# Patient Record
Sex: Female | Born: 1955
Health system: Southern US, Community
[De-identification: ages and names within clinical notes are randomized; demographics above are authoritative.]

## PROBLEM LIST (undated history)

## (undated) DIAGNOSIS — E039 Hypothyroidism, unspecified: Secondary | ICD-10-CM

## (undated) DIAGNOSIS — I493 Ventricular premature depolarization: Secondary | ICD-10-CM

## (undated) DIAGNOSIS — M1990 Unspecified osteoarthritis, unspecified site: Secondary | ICD-10-CM

## (undated) DIAGNOSIS — Z860100 Personal history of colon polyps, unspecified: Secondary | ICD-10-CM

## (undated) DIAGNOSIS — R0602 Shortness of breath: Secondary | ICD-10-CM

## (undated) DIAGNOSIS — M7989 Other specified soft tissue disorders: Secondary | ICD-10-CM

## (undated) DIAGNOSIS — M2550 Pain in unspecified joint: Secondary | ICD-10-CM

## (undated) DIAGNOSIS — M549 Dorsalgia, unspecified: Secondary | ICD-10-CM

## (undated) DIAGNOSIS — K219 Gastro-esophageal reflux disease without esophagitis: Secondary | ICD-10-CM

## (undated) DIAGNOSIS — E559 Vitamin D deficiency, unspecified: Secondary | ICD-10-CM

## (undated) DIAGNOSIS — R0600 Dyspnea, unspecified: Secondary | ICD-10-CM

## (undated) DIAGNOSIS — J45909 Unspecified asthma, uncomplicated: Secondary | ICD-10-CM

## (undated) DIAGNOSIS — D259 Leiomyoma of uterus, unspecified: Secondary | ICD-10-CM

## (undated) DIAGNOSIS — R112 Nausea with vomiting, unspecified: Secondary | ICD-10-CM

## (undated) DIAGNOSIS — E079 Disorder of thyroid, unspecified: Secondary | ICD-10-CM

## (undated) DIAGNOSIS — I491 Atrial premature depolarization: Secondary | ICD-10-CM

## (undated) DIAGNOSIS — R7303 Prediabetes: Secondary | ICD-10-CM

## (undated) DIAGNOSIS — R12 Heartburn: Secondary | ICD-10-CM

## (undated) DIAGNOSIS — F419 Anxiety disorder, unspecified: Secondary | ICD-10-CM

## (undated) DIAGNOSIS — N840 Polyp of corpus uteri: Secondary | ICD-10-CM

## (undated) DIAGNOSIS — K5900 Constipation, unspecified: Secondary | ICD-10-CM

## (undated) DIAGNOSIS — Z9889 Other specified postprocedural states: Secondary | ICD-10-CM

## (undated) DIAGNOSIS — R002 Palpitations: Secondary | ICD-10-CM

## (undated) DIAGNOSIS — M7580 Other shoulder lesions, unspecified shoulder: Secondary | ICD-10-CM

## (undated) DIAGNOSIS — G43909 Migraine, unspecified, not intractable, without status migrainosus: Secondary | ICD-10-CM

## (undated) HISTORY — DX: Hypothyroidism, unspecified: E03.9

## (undated) HISTORY — DX: Anxiety disorder, unspecified: F41.9

## (undated) HISTORY — PX: TONSILLECTOMY: SUR1361

## (undated) HISTORY — PX: FOOT SURGERY: SHX648

## (undated) HISTORY — DX: Disorder of thyroid, unspecified: E07.9

## (undated) HISTORY — DX: Constipation, unspecified: K59.00

## (undated) HISTORY — DX: Other specified soft tissue disorders: M79.89

## (undated) HISTORY — DX: Vitamin D deficiency, unspecified: E55.9

## (undated) HISTORY — DX: Unspecified osteoarthritis, unspecified site: M19.90

## (undated) HISTORY — DX: Leiomyoma of uterus, unspecified: D25.9

## (undated) HISTORY — DX: Gastro-esophageal reflux disease without esophagitis: K21.9

## (undated) HISTORY — DX: Unspecified asthma, uncomplicated: J45.909

## (undated) HISTORY — DX: Other shoulder lesions, unspecified shoulder: M75.80

## (undated) HISTORY — DX: Polyp of corpus uteri: N84.0

## (undated) HISTORY — DX: Dorsalgia, unspecified: M54.9

## (undated) HISTORY — DX: Pain in unspecified joint: M25.50

## (undated) HISTORY — DX: Migraine, unspecified, not intractable, without status migrainosus: G43.909

## (undated) HISTORY — DX: Atrial premature depolarization: I49.1

## (undated) HISTORY — DX: Heartburn: R12

## (undated) HISTORY — DX: Ventricular premature depolarization: I49.3

## (undated) HISTORY — DX: Shortness of breath: R06.02

## (undated) HISTORY — PX: OTHER SURGICAL HISTORY: SHX169

## (undated) HISTORY — PX: CARDIOVASCULAR STRESS TEST: SHX262

## (undated) HISTORY — PX: APPENDECTOMY: SHX54

---

## 1983-09-08 HISTORY — PX: RESECTION TUMOR CLAVICLE RADICAL: SUR1255

## 1998-09-09 ENCOUNTER — Other Ambulatory Visit: Admission: RE | Admit: 1998-09-09 | Discharge: 1998-09-09 | Payer: Self-pay | Admitting: Obstetrics and Gynecology

## 1999-10-21 ENCOUNTER — Encounter: Admission: RE | Admit: 1999-10-21 | Discharge: 1999-10-21 | Payer: Self-pay | Admitting: Obstetrics and Gynecology

## 1999-10-21 ENCOUNTER — Encounter: Payer: Self-pay | Admitting: Obstetrics and Gynecology

## 1999-10-28 ENCOUNTER — Other Ambulatory Visit: Admission: RE | Admit: 1999-10-28 | Discharge: 1999-10-28 | Payer: Self-pay | Admitting: Obstetrics and Gynecology

## 2000-03-08 ENCOUNTER — Encounter: Admission: RE | Admit: 2000-03-08 | Discharge: 2000-03-08 | Payer: Self-pay | Admitting: Family Medicine

## 2000-03-08 ENCOUNTER — Encounter: Payer: Self-pay | Admitting: Family Medicine

## 2000-06-03 ENCOUNTER — Encounter: Admission: RE | Admit: 2000-06-03 | Discharge: 2000-06-03 | Payer: Self-pay | Admitting: Family Medicine

## 2000-06-03 ENCOUNTER — Encounter: Payer: Self-pay | Admitting: Family Medicine

## 2000-10-21 ENCOUNTER — Encounter: Admission: RE | Admit: 2000-10-21 | Discharge: 2000-10-21 | Payer: Self-pay | Admitting: Family Medicine

## 2000-10-21 ENCOUNTER — Encounter: Payer: Self-pay | Admitting: Family Medicine

## 2000-11-08 ENCOUNTER — Other Ambulatory Visit: Admission: RE | Admit: 2000-11-08 | Discharge: 2000-11-08 | Payer: Self-pay | Admitting: Obstetrics and Gynecology

## 2001-10-28 ENCOUNTER — Encounter: Payer: Self-pay | Admitting: Family Medicine

## 2001-10-28 ENCOUNTER — Encounter: Admission: RE | Admit: 2001-10-28 | Discharge: 2001-10-28 | Payer: Self-pay | Admitting: Family Medicine

## 2001-11-14 ENCOUNTER — Other Ambulatory Visit: Admission: RE | Admit: 2001-11-14 | Discharge: 2001-11-14 | Payer: Self-pay | Admitting: Obstetrics and Gynecology

## 2002-10-30 ENCOUNTER — Encounter: Payer: Self-pay | Admitting: Family Medicine

## 2002-10-30 ENCOUNTER — Encounter: Admission: RE | Admit: 2002-10-30 | Discharge: 2002-10-30 | Payer: Self-pay | Admitting: Family Medicine

## 2002-11-20 ENCOUNTER — Other Ambulatory Visit: Admission: RE | Admit: 2002-11-20 | Discharge: 2002-11-20 | Payer: Self-pay | Admitting: Obstetrics and Gynecology

## 2003-02-12 ENCOUNTER — Encounter: Admission: RE | Admit: 2003-02-12 | Discharge: 2003-02-12 | Payer: Self-pay | Admitting: Family Medicine

## 2003-02-12 ENCOUNTER — Encounter: Payer: Self-pay | Admitting: Family Medicine

## 2003-11-05 ENCOUNTER — Encounter: Admission: RE | Admit: 2003-11-05 | Discharge: 2003-11-05 | Payer: Self-pay | Admitting: Family Medicine

## 2003-12-04 ENCOUNTER — Other Ambulatory Visit: Admission: RE | Admit: 2003-12-04 | Discharge: 2003-12-04 | Payer: Self-pay | Admitting: Obstetrics and Gynecology

## 2004-08-18 ENCOUNTER — Encounter: Admission: RE | Admit: 2004-08-18 | Discharge: 2004-08-18 | Payer: Self-pay | Admitting: Orthopedic Surgery

## 2004-12-03 ENCOUNTER — Encounter: Admission: RE | Admit: 2004-12-03 | Discharge: 2004-12-03 | Payer: Self-pay | Admitting: Family Medicine

## 2005-02-04 ENCOUNTER — Other Ambulatory Visit: Admission: RE | Admit: 2005-02-04 | Discharge: 2005-02-04 | Payer: Self-pay | Admitting: Addiction Medicine

## 2005-04-20 ENCOUNTER — Encounter: Admission: RE | Admit: 2005-04-20 | Discharge: 2005-04-20 | Payer: Self-pay | Admitting: Orthopedic Surgery

## 2005-12-07 ENCOUNTER — Encounter: Admission: RE | Admit: 2005-12-07 | Discharge: 2005-12-07 | Payer: Self-pay | Admitting: Obstetrics and Gynecology

## 2006-02-05 ENCOUNTER — Other Ambulatory Visit: Admission: RE | Admit: 2006-02-05 | Discharge: 2006-02-05 | Payer: Self-pay | Admitting: Gynecology

## 2006-09-07 DIAGNOSIS — M7580 Other shoulder lesions, unspecified shoulder: Secondary | ICD-10-CM

## 2006-09-07 HISTORY — DX: Other shoulder lesions, unspecified shoulder: M75.80

## 2007-01-06 ENCOUNTER — Encounter: Admission: RE | Admit: 2007-01-06 | Discharge: 2007-01-06 | Payer: Self-pay | Admitting: Internal Medicine

## 2007-03-30 ENCOUNTER — Ambulatory Visit (HOSPITAL_COMMUNITY): Admission: RE | Admit: 2007-03-30 | Discharge: 2007-03-31 | Payer: Self-pay | Admitting: Orthopedic Surgery

## 2007-08-13 ENCOUNTER — Encounter: Admission: RE | Admit: 2007-08-13 | Discharge: 2007-08-13 | Payer: Self-pay | Admitting: Orthopedic Surgery

## 2008-01-27 ENCOUNTER — Encounter: Admission: RE | Admit: 2008-01-27 | Discharge: 2008-01-27 | Payer: Self-pay | Admitting: Internal Medicine

## 2008-03-15 ENCOUNTER — Encounter: Admission: RE | Admit: 2008-03-15 | Discharge: 2008-03-15 | Payer: Self-pay | Admitting: Internal Medicine

## 2008-06-12 ENCOUNTER — Ambulatory Visit: Payer: Self-pay | Admitting: Internal Medicine

## 2008-06-14 ENCOUNTER — Other Ambulatory Visit: Admission: RE | Admit: 2008-06-14 | Discharge: 2008-06-14 | Payer: Self-pay | Admitting: Obstetrics and Gynecology

## 2008-06-14 ENCOUNTER — Encounter: Payer: Self-pay | Admitting: Women's Health

## 2008-06-14 ENCOUNTER — Ambulatory Visit: Payer: Self-pay | Admitting: Women's Health

## 2008-09-10 ENCOUNTER — Ambulatory Visit: Payer: Self-pay | Admitting: Internal Medicine

## 2008-10-22 ENCOUNTER — Ambulatory Visit: Payer: Self-pay | Admitting: Internal Medicine

## 2008-12-14 ENCOUNTER — Ambulatory Visit: Payer: Self-pay | Admitting: Internal Medicine

## 2009-01-29 ENCOUNTER — Encounter: Admission: RE | Admit: 2009-01-29 | Discharge: 2009-01-29 | Payer: Self-pay | Admitting: Internal Medicine

## 2009-02-14 ENCOUNTER — Ambulatory Visit: Payer: Self-pay | Admitting: Internal Medicine

## 2009-03-28 ENCOUNTER — Ambulatory Visit: Payer: Self-pay | Admitting: Internal Medicine

## 2009-05-15 ENCOUNTER — Ambulatory Visit: Payer: Self-pay | Admitting: Women's Health

## 2009-05-15 ENCOUNTER — Ambulatory Visit: Payer: Self-pay | Admitting: Internal Medicine

## 2009-05-21 ENCOUNTER — Encounter (INDEPENDENT_AMBULATORY_CARE_PROVIDER_SITE_OTHER): Payer: Self-pay | Admitting: *Deleted

## 2009-05-23 ENCOUNTER — Ambulatory Visit: Payer: Self-pay | Admitting: Internal Medicine

## 2009-07-04 ENCOUNTER — Other Ambulatory Visit: Admission: RE | Admit: 2009-07-04 | Discharge: 2009-07-04 | Payer: Self-pay | Admitting: Obstetrics and Gynecology

## 2009-07-04 ENCOUNTER — Ambulatory Visit: Payer: Self-pay | Admitting: Women's Health

## 2009-07-04 ENCOUNTER — Encounter: Payer: Self-pay | Admitting: Women's Health

## 2009-07-08 ENCOUNTER — Ambulatory Visit: Payer: Self-pay | Admitting: Internal Medicine

## 2009-08-14 ENCOUNTER — Ambulatory Visit: Payer: Self-pay | Admitting: Women's Health

## 2009-09-30 ENCOUNTER — Ambulatory Visit: Payer: Self-pay | Admitting: Internal Medicine

## 2009-10-18 ENCOUNTER — Ambulatory Visit (HOSPITAL_BASED_OUTPATIENT_CLINIC_OR_DEPARTMENT_OTHER): Admission: RE | Admit: 2009-10-18 | Discharge: 2009-10-18 | Payer: Self-pay | Admitting: Neurology

## 2009-11-03 ENCOUNTER — Ambulatory Visit: Payer: Self-pay | Admitting: Internal Medicine

## 2009-11-08 ENCOUNTER — Ambulatory Visit: Payer: Self-pay | Admitting: Internal Medicine

## 2009-12-17 ENCOUNTER — Ambulatory Visit: Payer: Self-pay | Admitting: Obstetrics and Gynecology

## 2009-12-31 ENCOUNTER — Ambulatory Visit: Payer: Self-pay | Admitting: Obstetrics and Gynecology

## 2010-01-13 ENCOUNTER — Ambulatory Visit: Payer: Self-pay | Admitting: Obstetrics and Gynecology

## 2010-01-16 ENCOUNTER — Ambulatory Visit (HOSPITAL_BASED_OUTPATIENT_CLINIC_OR_DEPARTMENT_OTHER): Admission: RE | Admit: 2010-01-16 | Discharge: 2010-01-16 | Payer: Self-pay | Admitting: Obstetrics and Gynecology

## 2010-01-16 ENCOUNTER — Ambulatory Visit: Payer: Self-pay | Admitting: Obstetrics and Gynecology

## 2010-01-17 ENCOUNTER — Ambulatory Visit: Payer: Self-pay | Admitting: Internal Medicine

## 2010-01-23 ENCOUNTER — Ambulatory Visit: Payer: Self-pay | Admitting: Obstetrics and Gynecology

## 2010-01-30 ENCOUNTER — Encounter: Admission: RE | Admit: 2010-01-30 | Discharge: 2010-01-30 | Payer: Self-pay | Admitting: Internal Medicine

## 2010-03-04 ENCOUNTER — Ambulatory Visit: Payer: Self-pay | Admitting: Internal Medicine

## 2010-06-18 ENCOUNTER — Ambulatory Visit: Payer: Self-pay | Admitting: Internal Medicine

## 2010-07-07 ENCOUNTER — Ambulatory Visit: Payer: Self-pay | Admitting: Women's Health

## 2010-07-07 ENCOUNTER — Other Ambulatory Visit: Admission: RE | Admit: 2010-07-07 | Discharge: 2010-07-07 | Payer: Self-pay | Admitting: Obstetrics and Gynecology

## 2010-07-24 ENCOUNTER — Ambulatory Visit: Payer: Self-pay | Admitting: Internal Medicine

## 2010-10-07 NOTE — Letter (Signed)
Summary: New Patient Letter  [facility] [place]  [address] [place], [place] [address]   Phone: [phone]  Fax: [phone]       05/21/2009 MRN: [id number]  [facility] [address] [place], [place]  [address]  Dear Ms. Frady,   Welcome to [facility] and thank you for choosing [place] as your Primary Care Providers. Enclosed you will find information about our practice that we hope you find helpful. We have also enclosed forms to be filled out prior to your visit. This will provide [place] with the necessary information and facilitate your being seen in a timely manner. If you have any questions, please call us at: [phone] and we will be happy to assist you. We look forward to seeing you at your scheduled appointment time.  Appointment Monday, June 24, 2009 at 3:00PM  with Dr. Nolon Rod. Paz   Sincerely,  Primary Health Care Team  Please arrive 15 minutes early for your first appointment and bring your insurance card. Co-pay is required at the time of your visit.  *****Please call the office if you are not able to keep this appointment. There is a charge of $50.00 if any appointment is not cancelled or rescheduled within 24 hours*****

## 2010-10-13 ENCOUNTER — Ambulatory Visit (INDEPENDENT_AMBULATORY_CARE_PROVIDER_SITE_OTHER): Payer: BC Managed Care – PPO | Admitting: Internal Medicine

## 2010-10-13 DIAGNOSIS — J209 Acute bronchitis, unspecified: Secondary | ICD-10-CM

## 2010-10-13 DIAGNOSIS — J069 Acute upper respiratory infection, unspecified: Secondary | ICD-10-CM

## 2011-01-08 ENCOUNTER — Ambulatory Visit (INDEPENDENT_AMBULATORY_CARE_PROVIDER_SITE_OTHER): Payer: BC Managed Care – PPO | Admitting: Internal Medicine

## 2011-01-08 DIAGNOSIS — E039 Hypothyroidism, unspecified: Secondary | ICD-10-CM

## 2011-01-20 NOTE — Op Note (Signed)
Mckenzie Hebert, Mckenzie Hebert              ACCOUNT NO.:  [id number]   MEDICAL RECORD NO.:  [id number]          PATIENT TYPE:  AMB   LOCATION:  DAY                          FACILITY:  [facility]   PHYSICIAN:  Ronald A. Gioffre, M.D.DATE OF BIRTH:  June 22, 1956   DATE OF PROCEDURE:  03/30/2007  DATE OF DISCHARGE:                               OPERATIVE REPORT   SURGEON:  Georges Lynch. Darrelyn Hillock, M.D.   ASSISTANT:  Jamelle Rushing, P.A.-C.   PREOPERATIVE DIAGNOSIS:  Large painful posterior heel spur with Achilles  tendon tendinitis, right heel.   POSTOPERATIVE DIAGNOSIS:  Large painful posterior heel spur with  Achilles tendon tendinitis, right heel.   OPERATION:  1. Excision of a necrotic heel spur and debridement of the posterior      tibial calcaneal joint.  2. Repair of the Achilles tendon with one Mitek anchor suture.   PROCEDURE:  Under general anesthesia with a the tourniquet elevated to  350 mmHg, we first obviously exsanguinated the right lower extremity  after a sterile prep and drape was carried out.  The tourniquet was  elevated to 350 mmHg.  At this time, a posterolateral incision was made  over the Achilles tendon.  Immediately upon going down into the area, I  noticed the posterior aspect of his os calcis was rather necrotic and  basically was more or less a necrotic spur.  I went down and debrided  that thoroughly.  I went into the posterior joint.  He had a large  amount of synovial fluid that came out of the joint and we did a  posterior synovectomy on him, as well.  Following that, after we  debrided the area, we then inserted one four-pronged Mitek anchor suture  down into the posterior calcaneus and used the suture to help repair the  tendon defect.  We then used transverse sutures to repair of the  remaining part of the Achilles tendon.  I thoroughly irrigated out the  area and closed the wound in the usual fashion.  Steri-Strips were used  to close the skin.  A sterile dressing  was applied and the patient was  placed in a short leg cast with her foot in plantar flexion.  She had 1  gram of IV Ancef preop.           ______________________________  Georges Lynch. Darrelyn Hillock, M.D.     RAG/MEDQ  D:  03/30/2007  T:  03/30/2007  Job:  [id number]

## 2011-01-22 ENCOUNTER — Telehealth: Payer: Self-pay | Admitting: Internal Medicine

## 2011-01-22 NOTE — Telephone Encounter (Signed)
Pull Chart for me

## 2011-01-22 NOTE — Telephone Encounter (Signed)
Pt needs to come back in 3 months for OV and TSH per chart.

## 2011-01-23 ENCOUNTER — Ambulatory Visit: Payer: BC Managed Care – PPO | Admitting: Internal Medicine

## 2011-01-29 ENCOUNTER — Other Ambulatory Visit: Payer: Self-pay | Admitting: Internal Medicine

## 2011-01-30 ENCOUNTER — Other Ambulatory Visit: Payer: Self-pay | Admitting: *Deleted

## 2011-01-30 DIAGNOSIS — G4700 Insomnia, unspecified: Secondary | ICD-10-CM

## 2011-01-30 MED ORDER — ALPRAZOLAM 1 MG PO TABS: 1.0000 mg | ORAL_TABLET | Freq: Every evening | ORAL | Status: DC | PRN

## 2011-02-03 ENCOUNTER — Other Ambulatory Visit: Payer: Self-pay | Admitting: Internal Medicine

## 2011-02-03 DIAGNOSIS — Z1239 Encounter for other screening for malignant neoplasm of breast: Secondary | ICD-10-CM

## 2011-02-09 ENCOUNTER — Ambulatory Visit
Admission: RE | Admit: 2011-02-09 | Discharge: 2011-02-09 | Disposition: A | Discharge status: Home / Self Care | Payer: BC Managed Care – PPO | Source: Ambulatory Visit | Attending: Internal Medicine | Admitting: Internal Medicine

## 2011-02-09 DIAGNOSIS — Z1239 Encounter for other screening for malignant neoplasm of breast: Secondary | ICD-10-CM

## 2011-03-25 ENCOUNTER — Ambulatory Visit (INDEPENDENT_AMBULATORY_CARE_PROVIDER_SITE_OTHER): Payer: BC Managed Care – PPO | Admitting: Women's Health

## 2011-03-25 DIAGNOSIS — N951 Menopausal and female climacteric states: Secondary | ICD-10-CM

## 2011-03-25 DIAGNOSIS — B373 Candidiasis of vulva and vagina: Secondary | ICD-10-CM

## 2011-03-25 DIAGNOSIS — N898 Other specified noninflammatory disorders of vagina: Secondary | ICD-10-CM

## 2011-03-31 ENCOUNTER — Encounter: Payer: Self-pay | Admitting: *Deleted

## 2011-04-03 ENCOUNTER — Encounter: Payer: Self-pay | Admitting: Internal Medicine

## 2011-04-03 ENCOUNTER — Ambulatory Visit (INDEPENDENT_AMBULATORY_CARE_PROVIDER_SITE_OTHER): Payer: BC Managed Care – PPO | Admitting: Internal Medicine

## 2011-04-03 VITALS — BP 112/84 | HR 76 | Temp 98.7°F | Ht 66.0 in | Wt 245.0 lb

## 2011-04-03 DIAGNOSIS — E559 Vitamin D deficiency, unspecified: Secondary | ICD-10-CM

## 2011-04-03 DIAGNOSIS — L259 Unspecified contact dermatitis, unspecified cause: Secondary | ICD-10-CM

## 2011-04-03 DIAGNOSIS — G4700 Insomnia, unspecified: Secondary | ICD-10-CM

## 2011-04-03 DIAGNOSIS — J45909 Unspecified asthma, uncomplicated: Secondary | ICD-10-CM

## 2011-04-03 DIAGNOSIS — L509 Urticaria, unspecified: Secondary | ICD-10-CM

## 2011-04-03 DIAGNOSIS — L309 Dermatitis, unspecified: Secondary | ICD-10-CM

## 2011-04-03 DIAGNOSIS — K219 Gastro-esophageal reflux disease without esophagitis: Secondary | ICD-10-CM

## 2011-04-03 DIAGNOSIS — F419 Anxiety disorder, unspecified: Secondary | ICD-10-CM

## 2011-04-03 DIAGNOSIS — R21 Rash and other nonspecific skin eruption: Secondary | ICD-10-CM

## 2011-04-03 DIAGNOSIS — F411 Generalized anxiety disorder: Secondary | ICD-10-CM

## 2011-04-03 DIAGNOSIS — K635 Polyp of colon: Secondary | ICD-10-CM

## 2011-04-03 DIAGNOSIS — E039 Hypothyroidism, unspecified: Secondary | ICD-10-CM

## 2011-04-03 DIAGNOSIS — D126 Benign neoplasm of colon, unspecified: Secondary | ICD-10-CM

## 2011-04-03 LAB — TSH: TSH: 2.524 u[IU]/mL (ref 0.350–4.500)

## 2011-04-03 MED ORDER — METHYLPREDNISOLONE ACETATE 80 MG/ML IJ SUSP
80.0000 mg | Freq: Once | INTRAMUSCULAR | Status: AC
  Administered 2011-04-03: 80 mg via INTRAMUSCULAR

## 2011-04-04 ENCOUNTER — Encounter: Payer: Self-pay | Admitting: Internal Medicine

## 2011-04-04 DIAGNOSIS — L509 Urticaria, unspecified: Secondary | ICD-10-CM | POA: Insufficient documentation

## 2011-04-04 DIAGNOSIS — G4700 Insomnia, unspecified: Secondary | ICD-10-CM | POA: Insufficient documentation

## 2011-04-04 DIAGNOSIS — F419 Anxiety disorder, unspecified: Secondary | ICD-10-CM | POA: Insufficient documentation

## 2011-04-04 DIAGNOSIS — K219 Gastro-esophageal reflux disease without esophagitis: Secondary | ICD-10-CM | POA: Insufficient documentation

## 2011-04-04 DIAGNOSIS — E559 Vitamin D deficiency, unspecified: Secondary | ICD-10-CM | POA: Insufficient documentation

## 2011-04-04 DIAGNOSIS — K635 Polyp of colon: Secondary | ICD-10-CM | POA: Insufficient documentation

## 2011-04-04 DIAGNOSIS — L309 Dermatitis, unspecified: Secondary | ICD-10-CM | POA: Insufficient documentation

## 2011-04-04 DIAGNOSIS — J45909 Unspecified asthma, uncomplicated: Secondary | ICD-10-CM | POA: Insufficient documentation

## 2011-04-04 NOTE — Progress Notes (Signed)
  Subjective:    Patient ID: Mckenzie Hebert, female    DOB: 17-Apr-1956, 55 y.o.   MRN: [id number]  HPI patient with history of obesity, urticaria, hypothyroidism, migraine headaches, anxiety, asthma, intertrigo under breast, vitamin D deficiency, GE reflux, eczema. In today with pruritic rash both arms. Thought it might be related to having yard sprayed recently. Does have dogs. Doesn't look to be insect bites. Has been scratching arms a lot. Also due for TSH on the dose of Synthroid 0.112 mg daily. History of H. pylori gastritis July 2009. Colonoscopy by Dr. Loreta Ave 2007. Mammogram May 2011. Pneumovax October 2007. Gets annual influenza vaccine. Herpes zoster outbreak June 2011. Angioedema of her lip November 2010. History of Chiari I malformation diagnosed 2001.    Review of Systems     Objective:   Physical Exam has small papules on both arms more pronounced on the left forearm volar aspect in the right arm. No edema of the face lips or tongue. No rash on legs. No significant rash on the abdomen        Assessment & Plan:   Urticaria  Possible contact dermatitis  Hypothyroidism  Obesity  Anxiety  Plan patient brings in a number of steroid creams and antifungal creams that she has on hand at home. I have reviewed with her today the proper use is for these creams including Nizoral for intertrigo, clotrimazole for vaginal itching which was recently prescribed by GYN physician, triamcinolone cream which will be appropriate for dermatitis on her forearms today use sparingly 3 times daily, 1% hydrocortisone cream which she may use for dermatitis on her face if ever needed, Lidex cream which I explained to her was stronger than triamcinolone and probably not appropriate at this time. Also given 80 mg IM Depo-Medrol today for pruritus. TSH drawn today in followup of hypothyroidism status post change in dose to 0.112 mg daily several weeks ago. Return in 6 months

## 2011-04-06 ENCOUNTER — Telehealth: Payer: Self-pay | Admitting: *Deleted

## 2011-04-06 NOTE — Telephone Encounter (Signed)
Pt called to let us know she had to visit an urgent care over the weekend for follow up on her rash.  She is now on a steroid dose pack and feeling slightly better.  She stated she would call the office if symptoms do not improve over the next couple of days.

## 2011-04-08 ENCOUNTER — Other Ambulatory Visit: Payer: Self-pay

## 2011-06-02 ENCOUNTER — Encounter: Payer: Self-pay | Admitting: Internal Medicine

## 2011-06-02 ENCOUNTER — Ambulatory Visit (INDEPENDENT_AMBULATORY_CARE_PROVIDER_SITE_OTHER): Payer: BC Managed Care – PPO | Admitting: Internal Medicine

## 2011-06-02 VITALS — BP 132/90 | HR 80 | Temp 98.6°F | Ht 66.0 in | Wt 258.0 lb

## 2011-06-02 DIAGNOSIS — E039 Hypothyroidism, unspecified: Secondary | ICD-10-CM

## 2011-06-02 DIAGNOSIS — Z23 Encounter for immunization: Secondary | ICD-10-CM

## 2011-06-02 DIAGNOSIS — H538 Other visual disturbances: Secondary | ICD-10-CM

## 2011-06-02 DIAGNOSIS — Z131 Encounter for screening for diabetes mellitus: Secondary | ICD-10-CM

## 2011-06-02 LAB — HEMOGLOBIN A1C
Hgb A1c MFr Bld: 6 % — ABNORMAL HIGH (ref <5.7)
Mean Plasma Glucose: 126 mg/dL — ABNORMAL HIGH (ref <117)

## 2011-06-02 LAB — TSH: TSH: 3.834 u[IU]/mL (ref 0.350–4.500)

## 2011-06-03 ENCOUNTER — Encounter: Payer: Self-pay | Admitting: Internal Medicine

## 2011-06-03 NOTE — Progress Notes (Signed)
  Subjective:    Patient ID: Mckenzie Hebert, female    DOB: 21-Feb-1956, 55 y.o.   MRN: [id number]  HPI patient was seen here recently with a nonspecific dermatitis and was treated with steroids. Subsequently went to see dermatologist and had a biopsy of areas of concern. Wound up taking steroids for several weeks as I understand it. Says she took a total of 80 prednisone tablets. Now had an episode yesterday of blurry vision when looking at her computer screen at work. Says she uses reading glasses. Has not had a recent eye exam. History of hypothyroidism. TSH checked today along with hemoglobin A1c.    Review of Systems     Objective:   Physical Exam funduscopic exam shows a distant be sharp and flat bilaterally. Normal vasculature. Cataracts not identified. Vision screen 20/40 both eyes.        Assessment & Plan:  Episode of brief blurriness of vision-? Myopia. Suggest patient have vision check with Dr. Abel Presto whom she seen previously. May benefit from bifocals. Hemoglobin A1c and TSH drawn today.

## 2011-06-08 ENCOUNTER — Encounter: Payer: BC Managed Care – PPO | Attending: Internal Medicine | Admitting: *Deleted

## 2011-06-08 ENCOUNTER — Encounter: Payer: Self-pay | Admitting: *Deleted

## 2011-06-08 DIAGNOSIS — Z713 Dietary counseling and surveillance: Secondary | ICD-10-CM | POA: Insufficient documentation

## 2011-06-08 DIAGNOSIS — E119 Type 2 diabetes mellitus without complications: Secondary | ICD-10-CM | POA: Insufficient documentation

## 2011-06-08 NOTE — Progress Notes (Signed)
  Medical Nutrition Therapy:  Appt start time: 1500 end time:  1600.   Assessment:  Primary concerns today: Newly diagnosed Type 2 Diabetes Nutrition [place].   MEDICATIONS: see list. No diabetes medications at this time    DIETARY INTAKE:  Usual eating pattern includes 3 meals and 1-3 snacks per day.  Everyday foods include good variety of all food groups.  Avoided foods include sweets, fried foods.    24-hr recall:  B ( AM): Banana and peanut butter sandwich or a muffin  Snk ( AM): low fat yogurt and Fiber One bar  L ( PM): lite bread sandwich with meat and cheese, lite mayo and mustard, chips, jello parfait and Fiber One brownie Snk ( PM): rare D ( PM): quick meal, does not cook since husband died 4 years ago Snk ( PM): rare Beverages: Diet Coke, water, cranberry grape juice. coffee  Usual physical activity: works out with trainer 3x/week, would like to do water exercises due to knee issues  Estimated energy needs: 1400 calories 158 g carbohydrates 105 g protein 39 g fat  Progress Towards Goal(s):  In progress.   Nutritional Diagnosis:  NI-1.5 Excessive energy intake As related to obesity.  As evidenced by current weight of 262.7#.    Intervention:  Nutrition recommendations of consistent carb intake of 45 grams/meal and 0-30 grams per snack.  Handouts given during visit include:  Living Well With Diabetes  Menu Planner to practice carb counting  Monitoring/Evaluation:  Dietary intake, exercise, self monitoring of BG with meter she received from MD, and body weight prn.

## 2011-06-22 LAB — PROTIME-INR
INR: 1
INR: 1
Prothrombin Time: 13.1
Prothrombin Time: 13.7

## 2011-06-22 LAB — HEMOGLOBIN AND HEMATOCRIT, BLOOD
HCT: 34.7 — ABNORMAL LOW
Hemoglobin: 11.9 — ABNORMAL LOW

## 2011-06-22 LAB — BASIC METABOLIC PANEL
BUN: 10
CO2: 26
Calcium: 9
Chloride: 105
Creatinine, Ser: 0.7
GFR calc Af Amer: >60
GFR calc non Af Amer: >60
Glucose, Bld: 149 — ABNORMAL HIGH
Potassium: 4.1
Sodium: 138

## 2011-06-22 LAB — PREGNANCY, URINE: Preg Test, Ur: NEGATIVE

## 2011-06-22 LAB — APTT: aPTT: 27

## 2011-06-23 ENCOUNTER — Other Ambulatory Visit: Payer: Self-pay | Admitting: Internal Medicine

## 2011-07-09 ENCOUNTER — Encounter: Payer: BC Managed Care – PPO | Admitting: Women's Health

## 2011-07-10 ENCOUNTER — Ambulatory Visit: Payer: BC Managed Care – PPO | Admitting: Internal Medicine

## 2011-07-20 ENCOUNTER — Other Ambulatory Visit (HOSPITAL_COMMUNITY)
Admission: RE | Admit: 2011-07-20 | Discharge: 2011-07-20 | Disposition: A | Discharge status: Home / Self Care | Payer: BC Managed Care – PPO | Source: Ambulatory Visit | Attending: Obstetrics and Gynecology | Admitting: Obstetrics and Gynecology

## 2011-07-20 ENCOUNTER — Ambulatory Visit (INDEPENDENT_AMBULATORY_CARE_PROVIDER_SITE_OTHER): Payer: BC Managed Care – PPO | Admitting: Women's Health

## 2011-07-20 ENCOUNTER — Encounter: Payer: Self-pay | Admitting: Women's Health

## 2011-07-20 VITALS — BP 120/70 | Ht 66.5 in | Wt 263.0 lb

## 2011-07-20 DIAGNOSIS — Z01419 Encounter for gynecological examination (general) (routine) without abnormal findings: Secondary | ICD-10-CM

## 2011-07-20 DIAGNOSIS — G4700 Insomnia, unspecified: Secondary | ICD-10-CM

## 2011-07-20 MED ORDER — ALPRAZOLAM 1 MG PO TABS: 1.0000 mg | ORAL_TABLET | Freq: Every evening | ORAL | Status: DC | PRN

## 2011-07-20 NOTE — Progress Notes (Signed)
Mckenzie Hebert 04/17/1956 [id number]    History:    The patient presents for annual exam.  Works at [employer].   Past medical history, past surgical history, family history and social history were all reviewed and documented in the EPIC chart.   ROS:  A  ROS was performed and pertinent positives and negatives are included in the history.  Exam:  Filed Vitals:   07/20/11 1525  BP: 120/70    General appearance:  Normal Head/Neck:  Normal, without cervical or supraclavicular adenopathy. Thyroid:  Symmetrical, normal in size, without palpable masses or nodularity. Respiratory  Effort:  Normal  Auscultation:  Clear without wheezing or rhonchi Cardiovascular  Auscultation:  Regular rate, without rubs, murmurs or gallops  Edema/varicosities:  Not grossly evident Abdominal  Soft,nontender, without masses, guarding or rebound.  Liver/spleen:  No organomegaly noted  Hernia:  None appreciated  Skin  Inspection:  Grossly normal  Palpation:  Grossly normal Neurologic/psychiatric  Orientation:  Normal with appropriate conversation.  Mood/affect:  Normal  Genitourinary    Breasts: Examined lying and sitting.     Right: Without masses, retractions, discharge or axillary adenopathy.     Left: Without masses, retractions, discharge or axillary adenopathy.   Inguinal/mons:  Normal without inguinal adenopathy  External genitalia:  Normal  BUS/Urethra/Skene's glands:  Normal  Bladder:  Normal  Vagina:  Normal  Cervix:  Normal  Uterus:   normal in size, shape and contour.  Midline and mobile  Adnexa/parametria:     Rt: Without masses or tenderness.   Lt: Without masses or tenderness.  Anus and perineum: Normal  Digital rectal exam: Normal sphincter tone without palpated masses or tenderness  Assessment/Plan:  55 y.o. WWF G1 P0 for annual exam.  Postmenopausal with no bleeding or HRT. History of a benign colon polyp in 08 will have a repeat colonoscopy next year. History  of an endometrial benign polyp in 2011 with no bleeding since. History of normal Paps and mammograms. Normal DEXA in November 2010 will repeat next year.   Normal postmenopausal exam Hypothyroidism/primary care labs and meds  Plan: SBEs, annual mammogram, vitamin D 2000 daily, encouraged to increase exercise and decrease calories for weight loss. Pap only. Has had some problems with situational anxiety and insomnia. Uses occasional Xanax 1 mg at at bedtime, prescription, reviewed addictive properties to use sparingly. Schedule colonoscopy with Dr. Loreta Ave in 2013. Not sexually active, encourage condoms if becomes.    Harrington Challenger [facility], 4:13 PM 07/20/2011

## 2011-09-03 ENCOUNTER — Other Ambulatory Visit: Payer: Self-pay

## 2011-09-03 DIAGNOSIS — G4700 Insomnia, unspecified: Secondary | ICD-10-CM

## 2011-09-03 MED ORDER — ALPRAZOLAM 1 MG PO TABS: 1.0000 mg | ORAL_TABLET | Freq: Every evening | ORAL | Status: DC | PRN

## 2011-10-30 ENCOUNTER — Other Ambulatory Visit: Payer: Self-pay | Admitting: Internal Medicine

## 2011-11-27 ENCOUNTER — Other Ambulatory Visit: Payer: BC Managed Care – PPO | Admitting: Internal Medicine

## 2011-11-27 DIAGNOSIS — Z0000 Encounter for general adult medical examination without abnormal findings: Secondary | ICD-10-CM

## 2011-11-27 DIAGNOSIS — Z79899 Other long term (current) drug therapy: Secondary | ICD-10-CM

## 2011-11-27 DIAGNOSIS — E039 Hypothyroidism, unspecified: Secondary | ICD-10-CM

## 2011-11-27 LAB — COMPREHENSIVE METABOLIC PANEL
ALT: 33 U/L (ref 0–35)
AST: 35 U/L (ref 0–37)
Albumin: 4.5 g/dL (ref 3.5–5.2)
Alkaline Phosphatase: 68 U/L (ref 39–117)
BUN: 13 mg/dL (ref 6–23)
CO2: 26 mEq/L (ref 19–32)
Calcium: 9.2 mg/dL (ref 8.4–10.5)
Chloride: 102 mEq/L (ref 96–112)
Creat: 0.72 mg/dL (ref 0.50–1.10)
Glucose, Bld: 91 mg/dL (ref 70–99)
Potassium: 4.3 mEq/L (ref 3.5–5.3)
Sodium: 139 mEq/L (ref 135–145)
Total Bilirubin: 0.6 mg/dL (ref 0.3–1.2)
Total Protein: 7.2 g/dL (ref 6.0–8.3)

## 2011-11-27 LAB — LIPID PANEL
Cholesterol: 208 mg/dL — ABNORMAL HIGH (ref 0–200)
HDL: 51 mg/dL (ref >39)
LDL Cholesterol: 140 mg/dL — ABNORMAL HIGH (ref 0–99)
Total CHOL/HDL Ratio: 4.1 Ratio
Triglycerides: 83 mg/dL (ref <150)
VLDL: 17 mg/dL (ref 0–40)

## 2011-11-27 LAB — TSH: TSH: 2.085 u[IU]/mL (ref 0.350–4.500)

## 2011-11-28 LAB — CBC WITH DIFFERENTIAL/PLATELET
Basophils Absolute: 0 10*3/uL (ref 0.0–0.1)
Basophils Relative: 1 % (ref 0–1)
Eosinophils Absolute: 0.3 10*3/uL (ref 0.0–0.7)
Eosinophils Relative: 5 % (ref 0–5)
HCT: 40.3 % (ref 36.0–46.0)
Hemoglobin: 12.9 g/dL (ref 12.0–15.0)
Lymphocytes Relative: 21 % (ref 12–46)
Lymphs Abs: 1.4 10*3/uL (ref 0.7–4.0)
MCH: 26.1 pg (ref 26.0–34.0)
MCHC: 32 g/dL (ref 30.0–36.0)
MCV: 81.4 fL (ref 78.0–100.0)
Monocytes Absolute: 0.5 10*3/uL (ref 0.1–1.0)
Monocytes Relative: 7 % (ref 3–12)
Neutro Abs: 4.4 10*3/uL (ref 1.7–7.7)
Neutrophils Relative %: 66 % (ref 43–77)
Platelets: 286 10*3/uL (ref 150–400)
RBC: 4.95 MIL/uL (ref 3.87–5.11)
RDW: 14.6 % (ref 11.5–15.5)
WBC: 6.6 10*3/uL (ref 4.0–10.5)

## 2011-11-28 LAB — VITAMIN D 25 HYDROXY (VIT D DEFICIENCY, FRACTURES): Vit D, 25-Hydroxy: 42 ng/mL (ref 30–89)

## 2011-11-30 ENCOUNTER — Encounter: Payer: Self-pay | Admitting: Internal Medicine

## 2011-11-30 ENCOUNTER — Ambulatory Visit (INDEPENDENT_AMBULATORY_CARE_PROVIDER_SITE_OTHER): Payer: BC Managed Care – PPO | Admitting: Internal Medicine

## 2011-11-30 VITALS — BP 126/84 | HR 88 | Temp 98.5°F | Ht 66.0 in | Wt 275.0 lb

## 2011-11-30 DIAGNOSIS — K635 Polyp of colon: Secondary | ICD-10-CM

## 2011-11-30 DIAGNOSIS — E669 Obesity, unspecified: Secondary | ICD-10-CM

## 2011-11-30 DIAGNOSIS — Z23 Encounter for immunization: Secondary | ICD-10-CM

## 2011-11-30 DIAGNOSIS — Z8669 Personal history of other diseases of the nervous system and sense organs: Secondary | ICD-10-CM

## 2011-11-30 DIAGNOSIS — K5904 Chronic idiopathic constipation: Secondary | ICD-10-CM

## 2011-11-30 DIAGNOSIS — F419 Anxiety disorder, unspecified: Secondary | ICD-10-CM

## 2011-11-30 DIAGNOSIS — K219 Gastro-esophageal reflux disease without esophagitis: Secondary | ICD-10-CM

## 2011-11-30 DIAGNOSIS — Z8639 Personal history of other endocrine, nutritional and metabolic disease: Secondary | ICD-10-CM

## 2011-11-30 DIAGNOSIS — Z872 Personal history of diseases of the skin and subcutaneous tissue: Secondary | ICD-10-CM

## 2011-11-30 DIAGNOSIS — E039 Hypothyroidism, unspecified: Secondary | ICD-10-CM

## 2011-11-30 DIAGNOSIS — Z0000 Encounter for general adult medical examination without abnormal findings: Secondary | ICD-10-CM

## 2011-11-30 LAB — POCT URINALYSIS DIPSTICK
Bilirubin, UA: NEGATIVE
Glucose, UA: NEGATIVE
Ketones, UA: NEGATIVE
Leukocytes, UA: NEGATIVE
Nitrite, UA: NEGATIVE
Protein, UA: NEGATIVE
Spec Grav, UA: 1.01
Urobilinogen, UA: NEGATIVE
pH, UA: 6

## 2011-12-07 DIAGNOSIS — Z8669 Personal history of other diseases of the nervous system and sense organs: Secondary | ICD-10-CM | POA: Insufficient documentation

## 2011-12-07 NOTE — Progress Notes (Signed)
Subjective:    Patient ID: Mckenzie Hebert, female    DOB: 04/10/1956, 56 y.o.   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 Patient had H. Pylori dx 2009. Had angioedema of lip 2010. Had herpes zoster June 2011. Had colonoscopy by Dr. Loreta Ave July 2007.  Patient is a widow. Husband died of heart problems. No children.  Family history: Mother died at age 10 with history of heart disease coronary artery disease status post CABG, diabetes. Father died of cancer. He had a history of a "nervous breakdown". One sister with history of diabetes and hypertension deceased. One sister in good health. 2 brothers in good health.  Additional history: Left knee arthroscopy in 2007, appendectomy 1993, bone spur removed from left foot 1999.  Patient used to smoke until the age of 35 and then quit. She takes laxative 2-3 times a week for functional constipation. History of asthma and uses Advair, takes Singulair. GYN physician is Dr.Gottsegen.    Review of Systems  Constitutional: Positive for fatigue.  HENT:       History of angioedema and urticaria  Eyes: Negative.   Respiratory:       History of asthma  Cardiovascular: Negative.   Gastrointestinal:       GE reflux  Genitourinary: Negative.   Psychiatric/Behavioral:       Anxiety and insomnia       Objective:   Physical Exam  Vitals reviewed. Constitutional: She is oriented to person, place, and time. She appears well-developed and well-nourished.  HENT:  Head: Atraumatic.  Right Ear: External ear normal.  Left Ear: External ear normal.  Mouth/Throat: Oropharynx is clear and  moist.  Eyes: Conjunctivae and EOM are normal. Pupils are equal, round, and reactive to light. Right eye exhibits no discharge. Left eye exhibits no discharge. No scleral icterus.  Neck: Normal range of motion. Neck supple. No JVD present. No thyromegaly present.  Cardiovascular: Normal rate, regular rhythm, normal heart sounds and intact distal pulses.   No murmur heard. Pulmonary/Chest: Effort normal and breath sounds normal. She has no wheezes. She has no rales.       Breasts normal female  Abdominal: Soft. Bowel sounds are normal. She exhibits no distension and no mass. There is no tenderness. There is no rebound and no guarding.  Genitourinary:       Deferred to GYN  Musculoskeletal: She exhibits no edema.  Lymphadenopathy:    She has no cervical adenopathy.  Neurological: She is alert and oriented to person, place, and time. She has normal reflexes. No cranial nerve deficit. Coordination normal.  Skin: Skin is warm and dry. No rash noted.  Psychiatric: She has a normal mood and affect. Her behavior is normal. Thought content normal.          Assessment & Plan:  Functional constipation. Has to take laxatives several times a week. Plan is to try Amitiza 8 mg twice daily. Can increase to 24 mg twice daily.  Hypothyroidism  Migraine headaches  History of urticaria  History of anxiety  History of insomnia  History of vitamin D deficiency  History of angioedema  History of asthma  GE reflux  History of hyperplastic colon polyps  Plan:  Tdap given. Pt. Is to  call Dr. Loreta Ave to see when she wants to do another screening colonoscopy. Pneumovax given in 2007. Return in 6 months. Will need TSH at that time.

## 2011-12-07 NOTE — Patient Instructions (Signed)
Continue same medications and return in 6 months 

## 2011-12-15 ENCOUNTER — Other Ambulatory Visit: Payer: Self-pay | Admitting: Internal Medicine

## 2012-02-25 ENCOUNTER — Other Ambulatory Visit: Payer: Self-pay | Admitting: Internal Medicine

## 2012-02-25 ENCOUNTER — Other Ambulatory Visit: Payer: Self-pay

## 2012-02-25 DIAGNOSIS — G4700 Insomnia, unspecified: Secondary | ICD-10-CM

## 2012-02-25 DIAGNOSIS — Z1231 Encounter for screening mammogram for malignant neoplasm of breast: Secondary | ICD-10-CM

## 2012-02-25 MED ORDER — ALPRAZOLAM 1 MG PO TABS: 1.0000 mg | ORAL_TABLET | Freq: Every evening | ORAL | Status: DC | PRN

## 2012-03-07 ENCOUNTER — Ambulatory Visit
Admission: RE | Admit: 2012-03-07 | Discharge: 2012-03-07 | Disposition: A | Discharge status: Home / Self Care | Payer: BC Managed Care – PPO | Source: Ambulatory Visit | Attending: Internal Medicine | Admitting: Internal Medicine

## 2012-03-07 DIAGNOSIS — Z1231 Encounter for screening mammogram for malignant neoplasm of breast: Secondary | ICD-10-CM

## 2012-05-02 ENCOUNTER — Other Ambulatory Visit: Payer: Self-pay | Admitting: Internal Medicine

## 2012-06-02 ENCOUNTER — Other Ambulatory Visit: Payer: BC Managed Care – PPO | Admitting: Internal Medicine

## 2012-06-13 ENCOUNTER — Other Ambulatory Visit: Payer: Self-pay | Admitting: Internal Medicine

## 2012-06-20 ENCOUNTER — Ambulatory Visit (INDEPENDENT_AMBULATORY_CARE_PROVIDER_SITE_OTHER): Payer: BC Managed Care – PPO | Admitting: Internal Medicine

## 2012-06-20 ENCOUNTER — Telehealth: Payer: Self-pay | Admitting: Internal Medicine

## 2012-06-20 ENCOUNTER — Encounter: Payer: Self-pay | Admitting: Internal Medicine

## 2012-06-20 VITALS — BP 122/86 | HR 84 | Temp 99.1°F | Ht 66.0 in | Wt 268.0 lb

## 2012-06-20 DIAGNOSIS — F419 Anxiety disorder, unspecified: Secondary | ICD-10-CM

## 2012-06-20 DIAGNOSIS — K2970 Gastritis, unspecified, without bleeding: Secondary | ICD-10-CM

## 2012-06-20 DIAGNOSIS — J45909 Unspecified asthma, uncomplicated: Secondary | ICD-10-CM

## 2012-06-20 DIAGNOSIS — F411 Generalized anxiety disorder: Secondary | ICD-10-CM

## 2012-06-20 DIAGNOSIS — K2990 Gastroduodenitis, unspecified, without bleeding: Secondary | ICD-10-CM

## 2012-06-20 DIAGNOSIS — K219 Gastro-esophageal reflux disease without esophagitis: Secondary | ICD-10-CM

## 2012-06-20 NOTE — Progress Notes (Signed)
  Subjective:    Patient ID: Mckenzie Hebert, female    DOB: Jan 25, 1956, 56 y.o.   MRN: [id number]  HPI More epigastric pain recently. More wheezing lately. Protonix not working.  If eating late, has to sit up to sleep. Had sleep study and did not need CPAP . History of H. Pylori 2009 treated. Had colonoscopy 2007. Recently had TSH which was normal. Will get flu shot here today. Patient fatigued. Chronically depressed since husband died. 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 She is overweight. Not motivated to diet and exercise.    Review of Systems     Objective:   Physical Exam HEENT exam: TMs and pharynx are clear. Neck is supple without thyromegaly. Chest clear. Cardiac exam regular rate and rhythm normal S1 and S2. Skin is warm and dry. Abdomen no hepatosplenomegaly masses or tenderness        Assessment & Plan:  GE reflux-worse despite Protonix  Hypothyroidism-recent TSH normal  Fatigue  Depression  Obesity  History of asthma  Plan: Change to Nexium 40 mg daily. If no relief, refer to gastroenterologist for possible endoscopy. Consideration could be given to fundoplication of GE reflux not controlled with Nexium.

## 2012-06-20 NOTE — Telephone Encounter (Signed)
Sp w/Mia @ [facility] [address] [phone].  Gave Rx orders as stated below by Dr. Lenord Fellers.  Sp w/patient @ [phone] to advise.  Patient verbalizes understanding of the instructions.

## 2012-06-20 NOTE — Telephone Encounter (Signed)
Call in Phenergan 25mg (#30) one po q 4 hours prn nausea with no refill. 

## 2012-06-22 ENCOUNTER — Telehealth: Payer: Self-pay | Admitting: Internal Medicine

## 2012-06-22 NOTE — Telephone Encounter (Signed)
Pt advised that refiling corrected claim could take 3-4 weeks.  It will be handled from the billing office of [facility] ([place]).  Pt ID #[id number] in EPIC Billing.

## 2012-07-03 NOTE — Patient Instructions (Addendum)
Change to Nexium from Protonix. If no relief in 2 weeks, we will consider GI referral. It is possible you may need a fundoplication procedure to control the reflux if Nexium does not work. He may also need to have endoscopy.

## 2012-07-25 ENCOUNTER — Encounter: Payer: BC Managed Care – PPO | Admitting: Women's Health

## 2012-08-15 ENCOUNTER — Encounter: Payer: BC Managed Care – PPO | Admitting: Women's Health

## 2012-09-09 ENCOUNTER — Encounter: Payer: Self-pay | Admitting: Internal Medicine

## 2012-10-03 ENCOUNTER — Encounter: Payer: Self-pay | Admitting: Internal Medicine

## 2012-10-03 ENCOUNTER — Ambulatory Visit (INDEPENDENT_AMBULATORY_CARE_PROVIDER_SITE_OTHER): Payer: PRIVATE HEALTH INSURANCE | Admitting: Internal Medicine

## 2012-10-03 VITALS — BP 126/80 | HR 80 | Temp 98.7°F | Wt 273.0 lb

## 2012-10-03 DIAGNOSIS — J069 Acute upper respiratory infection, unspecified: Secondary | ICD-10-CM

## 2012-10-03 DIAGNOSIS — K5289 Other specified noninfective gastroenteritis and colitis: Secondary | ICD-10-CM

## 2012-10-03 DIAGNOSIS — K529 Noninfective gastroenteritis and colitis, unspecified: Secondary | ICD-10-CM

## 2012-10-03 DIAGNOSIS — J9801 Acute bronchospasm: Secondary | ICD-10-CM

## 2012-10-03 NOTE — Patient Instructions (Addendum)
Stay with clear liquids until diarrhea has resolved and advance diet slowly. Take Zithromax Z-PAK as directed for respiratory infection. Use inhaler 4 times daily.

## 2012-10-04 LAB — CBC WITH DIFFERENTIAL/PLATELET
Basophils Absolute: 0.1 10*3/uL (ref 0.0–0.1)
Basophils Relative: 1 % (ref 0–1)
Eosinophils Absolute: 0.2 10*3/uL (ref 0.0–0.7)
Eosinophils Relative: 3 % (ref 0–5)
HCT: 34.5 % — ABNORMAL LOW (ref 36.0–46.0)
Hemoglobin: 11.8 g/dL — ABNORMAL LOW (ref 12.0–15.0)
Lymphocytes Relative: 24 % (ref 12–46)
Lymphs Abs: 1.7 10*3/uL (ref 0.7–4.0)
MCH: 25.8 pg — ABNORMAL LOW (ref 26.0–34.0)
MCHC: 34.2 g/dL (ref 30.0–36.0)
MCV: 75.5 fL — ABNORMAL LOW (ref 78.0–100.0)
Monocytes Absolute: 0.4 10*3/uL (ref 0.1–1.0)
Monocytes Relative: 6 % (ref 3–12)
Neutro Abs: 4.7 10*3/uL (ref 1.7–7.7)
Neutrophils Relative %: 66 % (ref 43–77)
Platelets: 314 10*3/uL (ref 150–400)
RBC: 4.57 MIL/uL (ref 3.87–5.11)
RDW: 15.1 % (ref 11.5–15.5)
WBC: 7 10*3/uL (ref 4.0–10.5)

## 2012-10-05 NOTE — Progress Notes (Signed)
Not enough blood to do iron studies. Patient is ok with returning on 10/06/2012 for these

## 2012-10-06 ENCOUNTER — Other Ambulatory Visit: Payer: PRIVATE HEALTH INSURANCE | Admitting: Internal Medicine

## 2012-10-06 DIAGNOSIS — D649 Anemia, unspecified: Secondary | ICD-10-CM

## 2012-10-06 LAB — IRON AND TIBC
%SAT: 17 % — ABNORMAL LOW (ref 20–55)
Iron: 65 ug/dL (ref 42–145)
TIBC: 378 ug/dL (ref 250–470)
UIBC: 313 ug/dL (ref 125–400)

## 2012-10-07 NOTE — Progress Notes (Signed)
Patient informed. 

## 2012-11-14 ENCOUNTER — Encounter: Payer: Self-pay | Admitting: Women's Health

## 2012-11-14 ENCOUNTER — Ambulatory Visit (INDEPENDENT_AMBULATORY_CARE_PROVIDER_SITE_OTHER): Payer: PRIVATE HEALTH INSURANCE | Admitting: Women's Health

## 2012-11-14 VITALS — BP 140/72 | Ht 66.75 in | Wt 260.0 lb

## 2012-11-14 DIAGNOSIS — Z13220 Encounter for screening for lipoid disorders: Secondary | ICD-10-CM

## 2012-11-14 DIAGNOSIS — Z01419 Encounter for gynecological examination (general) (routine) without abnormal findings: Secondary | ICD-10-CM

## 2012-11-14 DIAGNOSIS — Z833 Family history of diabetes mellitus: Secondary | ICD-10-CM

## 2012-11-14 DIAGNOSIS — E039 Hypothyroidism, unspecified: Secondary | ICD-10-CM

## 2012-11-14 MED ORDER — SYNTHROID 112 MCG PO TABS: 112.0000 ug | ORAL_TABLET | Freq: Every day | ORAL | Status: DC

## 2012-11-14 NOTE — Progress Notes (Signed)
KALIAH HADDAWAY [birth year] [id number]    History:    The patient presents for annual exam.  Postmenopausal/no bleeding/no HRT. Not sexually active. Hypothyroid on Synthroid. History of normal Paps and mammograms. Normal bone density 2010, T score hip average 0.9. Benign colon polyp 03/2006. 01/2010 hysteroscope with excision of endometrial polyp and submucosal myoma/Dr. Eda Paschal.   Past medical history, past surgical history, family history and social history were all reviewed and documented in the EPIC chart. Works at [employer]. Husband died 21-Jan-2023 from an MI, still struggles with loss, has 2 dogs. Mother, sister diabetes.   ROS:  A  ROS was performed and pertinent positives and negatives are included in the history.  Exam:  Filed Vitals:   11/14/12 1438  BP: 140/72    General appearance:  Normal Head/Neck:  Normal, without cervical or supraclavicular adenopathy. Thyroid:  Symmetrical, normal in size, without palpable masses or nodularity. Respiratory  Effort:  Normal  Auscultation:  Clear without wheezing or rhonchi Cardiovascular  Auscultation:  Regular rate, without rubs, murmurs or gallops  Edema/varicosities:  Not grossly evident Abdominal  Soft,nontender, without masses, guarding or rebound.  Liver/spleen:  No organomegaly noted  Hernia:  None appreciated  Skin  Inspection:  Grossly normal  Palpation:  Grossly normal Neurologic/psychiatric  Orientation:  Normal with appropriate conversation.  Mood/affect:  Normal  Genitourinary    Breasts: Examined lying and sitting.     Right: Without masses, retractions, discharge or axillary adenopathy.     Left: Without masses, retractions, discharge or axillary adenopathy.   Inguinal/mons:  Normal without inguinal adenopathy  External genitalia:  Normal  BUS/Urethra/Skene's glands:  Normal  Bladder:  Normal  Vagina:  Normal  Cervix:  Normal  Uterus:   normal in size, shape and contour.  Midline and  mobile  Adnexa/parametria:     Rt: Without masses or tenderness.   Lt: Without masses or tenderness.  Anus and perineum: Normal  Digital rectal exam: Normal sphincter tone without palpated masses or tenderness  Assessment/Plan:  57 y.o. WWF G1 P0 for annual exam.    Normal postmenopausal exam Obesity Hypothyroid  Plan: CBC, glucose, lipid panel, TSH, UA, Pap normal 07/2011, new screening guidelines reviewed. Condoms encouraged if become sexually active. Synthroid 112 daily, prescription, proper use given and reviewed. Will fax labs to primary care, followup as needed. Repeat DEXA next year. Colonoscopy not covered until 10 years in spite of negative polyp 2007, home Hemoccult card given with instructions. Continues to struggle with loss of husband, counseling encouraged, declines. SBE's, continue annual mammogram, calcium rich diet, vitamin D 2000 daily encouraged, increase regular exercise and decrease calories for weight loss.    Harrington [facility]all[facility], 5:34 PM 11/14/2012

## 2012-11-14 NOTE — Patient Instructions (Addendum)

## 2012-11-15 LAB — LIPID PANEL
Cholesterol: 193 mg/dL (ref 0–200)
HDL: 44 mg/dL (ref >39)
LDL Cholesterol: 127 mg/dL — ABNORMAL HIGH (ref 0–99)
Total CHOL/HDL Ratio: 4.4 Ratio
Triglycerides: 111 mg/dL (ref <150)
VLDL: 22 mg/dL (ref 0–40)

## 2012-11-15 LAB — COMPREHENSIVE METABOLIC PANEL
ALT: 42 U/L — ABNORMAL HIGH (ref 0–35)
AST: 33 U/L (ref 0–37)
Albumin: 4.2 g/dL (ref 3.5–5.2)
Alkaline Phosphatase: 75 U/L (ref 39–117)
BUN: 15 mg/dL (ref 6–23)
CO2: 27 mEq/L (ref 19–32)
Calcium: 9.4 mg/dL (ref 8.4–10.5)
Chloride: 102 mEq/L (ref 96–112)
Creat: 0.81 mg/dL (ref 0.50–1.10)
Glucose, Bld: 130 mg/dL — ABNORMAL HIGH (ref 70–99)
Potassium: 4.2 mEq/L (ref 3.5–5.3)
Sodium: 137 mEq/L (ref 135–145)
Total Bilirubin: 0.3 mg/dL (ref 0.3–1.2)
Total Protein: 7.1 g/dL (ref 6.0–8.3)

## 2012-11-15 LAB — TSH: TSH: 3.073 u[IU]/mL (ref 0.350–4.500)

## 2012-11-17 ENCOUNTER — Encounter: Payer: Self-pay | Admitting: Obstetrics and Gynecology

## 2012-11-18 ENCOUNTER — Other Ambulatory Visit: Payer: Self-pay | Admitting: Internal Medicine

## 2012-11-19 NOTE — Telephone Encounter (Signed)
Refill x 6 months 

## 2012-12-05 NOTE — Progress Notes (Signed)
  Subjective:    Patient ID: Mckenzie Hebert, female    DOB: [birth year], 57 y.o.   MRN: [id number]  HPI Patient in today complaining of epigastric discomfort. History of GE reflux and obesity. Has had nausea and diarrhea. Thinks she may have some food poisoning. Has also had URI symptoms with some wheezing. History of hypothyroidism and anxiety. History of migraine headaches. History of insomnia and vitamin D deficiency.   Review of Systems     Objective:   Physical Exam abdomen is obese nondistended with no hepatosplenomegaly or masses; mild tenderness epigastric area without rebound. Chest occasional inspiratory wheezing. TMs and pharynx are clear. Skin is pale warm and dry. Cardiac exam regular rate and rhythm normal S1 and S2. Neck supple without thyromegaly or adenopathy.        Assessment & Plan:  Probable viral gastroenteritis  Upper respiratory infection with bronchospasm  Plan: Stay with clear liquids until nausea and diarrhea have resolved 24 hours then advance diet slowly. Take Zithromax Z-PAK 2 tablets day one followed by 1 tablet days 2 through 5 for upper respiratory infection. Use inhaler albuterol 2 sprays by mouth 4 times daily for wheezing.

## 2012-12-14 ENCOUNTER — Other Ambulatory Visit: Payer: Self-pay | Admitting: Internal Medicine

## 2012-12-22 ENCOUNTER — Other Ambulatory Visit: Payer: Self-pay | Admitting: Family Medicine

## 2012-12-22 DIAGNOSIS — M25561 Pain in right knee: Secondary | ICD-10-CM

## 2012-12-24 ENCOUNTER — Ambulatory Visit
Admission: RE | Admit: 2012-12-24 | Discharge: 2012-12-24 | Disposition: A | Discharge status: Home / Self Care | Payer: PRIVATE HEALTH INSURANCE | Source: Ambulatory Visit | Attending: Family Medicine | Admitting: Family Medicine

## 2012-12-24 DIAGNOSIS — M25561 Pain in right knee: Secondary | ICD-10-CM

## 2012-12-28 ENCOUNTER — Other Ambulatory Visit: Payer: Self-pay | Admitting: Internal Medicine

## 2013-01-31 ENCOUNTER — Other Ambulatory Visit: Payer: Self-pay

## 2013-01-31 MED ORDER — ESOMEPRAZOLE MAGNESIUM 40 MG PO CPDR: 40.0000 mg | DELAYED_RELEASE_CAPSULE | Freq: Every day | ORAL | Status: DC

## 2013-02-01 ENCOUNTER — Other Ambulatory Visit: Payer: Self-pay

## 2013-02-01 DIAGNOSIS — Z1231 Encounter for screening mammogram for malignant neoplasm of breast: Secondary | ICD-10-CM

## 2013-03-14 ENCOUNTER — Ambulatory Visit: Payer: PRIVATE HEALTH INSURANCE

## 2013-04-03 ENCOUNTER — Ambulatory Visit
Admission: RE | Admit: 2013-04-03 | Discharge: 2013-04-03 | Disposition: A | Discharge status: Home / Self Care | Payer: PRIVATE HEALTH INSURANCE | Source: Ambulatory Visit

## 2013-04-03 DIAGNOSIS — Z1231 Encounter for screening mammogram for malignant neoplasm of breast: Secondary | ICD-10-CM

## 2013-04-24 ENCOUNTER — Encounter: Payer: Self-pay | Admitting: Internal Medicine

## 2013-04-24 ENCOUNTER — Ambulatory Visit (INDEPENDENT_AMBULATORY_CARE_PROVIDER_SITE_OTHER): Payer: PRIVATE HEALTH INSURANCE | Admitting: Internal Medicine

## 2013-04-24 VITALS — BP 126/90 | HR 76 | Temp 99.1°F | Wt 278.0 lb

## 2013-04-24 DIAGNOSIS — G935 Compression of brain: Secondary | ICD-10-CM

## 2013-04-24 DIAGNOSIS — Z131 Encounter for screening for diabetes mellitus: Secondary | ICD-10-CM

## 2013-04-24 DIAGNOSIS — Z8669 Personal history of other diseases of the nervous system and sense organs: Secondary | ICD-10-CM

## 2013-04-24 DIAGNOSIS — E039 Hypothyroidism, unspecified: Secondary | ICD-10-CM

## 2013-04-24 DIAGNOSIS — R202 Paresthesia of skin: Secondary | ICD-10-CM

## 2013-04-24 DIAGNOSIS — Z8659 Personal history of other mental and behavioral disorders: Secondary | ICD-10-CM

## 2013-04-24 DIAGNOSIS — R209 Unspecified disturbances of skin sensation: Secondary | ICD-10-CM

## 2013-04-25 LAB — TSH: TSH: 3.182 u[IU]/mL (ref 0.350–4.500)

## 2013-04-25 LAB — HEMOGLOBIN A1C
Hgb A1c MFr Bld: 6.4 % — ABNORMAL HIGH (ref <5.7)
Mean Plasma Glucose: 137 mg/dL — ABNORMAL HIGH (ref <117)

## 2013-04-26 NOTE — Progress Notes (Signed)
Patient informed. 

## 2013-06-01 ENCOUNTER — Telehealth: Payer: Self-pay | Admitting: Internal Medicine

## 2013-06-01 DIAGNOSIS — R42 Dizziness and giddiness: Secondary | ICD-10-CM

## 2013-06-01 DIAGNOSIS — R200 Anesthesia of skin: Secondary | ICD-10-CM

## 2013-06-01 NOTE — Telephone Encounter (Signed)
Can we send her to neurologist this week? Try [facility].

## 2013-06-01 NOTE — Telephone Encounter (Signed)
Spoke with Dr. Arbutus Leas directly, giving her patient's symptoms. She will check with her office staff to co-ordinate an OV this week and call us back,

## 2013-06-02 NOTE — Telephone Encounter (Signed)
Patient informed. She will call [facility] herself today to see about an appointment.

## 2013-06-04 ENCOUNTER — Other Ambulatory Visit: Payer: Self-pay | Admitting: Women's Health

## 2013-06-05 ENCOUNTER — Ambulatory Visit: Payer: PRIVATE HEALTH INSURANCE | Admitting: Neurology

## 2013-06-06 ENCOUNTER — Encounter: Payer: Self-pay | Admitting: Neurology

## 2013-06-06 ENCOUNTER — Ambulatory Visit (INDEPENDENT_AMBULATORY_CARE_PROVIDER_SITE_OTHER): Payer: PRIVATE HEALTH INSURANCE | Admitting: Neurology

## 2013-06-06 VITALS — BP 120/70 | HR 72 | Temp 98.6°F | Ht 67.0 in | Wt 283.0 lb

## 2013-06-06 DIAGNOSIS — G935 Compression of brain: Secondary | ICD-10-CM

## 2013-06-06 DIAGNOSIS — R209 Unspecified disturbances of skin sensation: Secondary | ICD-10-CM

## 2013-06-06 DIAGNOSIS — R200 Anesthesia of skin: Secondary | ICD-10-CM

## 2013-06-06 LAB — VITAMIN B12: Vitamin B-12: 408 pg/mL (ref 211–911)

## 2013-06-06 NOTE — Progress Notes (Signed)
  Subjective:    Patient ID: Mckenzie Hebert, female    DOB: 05-31-1956, 57 y.o.   MRN: [id number]  HPI 58 year old white female complaining of numbness and tingling around her mouth for the past month. Doesn't know what has caused this or what brings it 9. Seems to be something that occurs intermittently. History of GE reflux, anxiety, asthma. History of H. pylori 2009. Had colonoscopy 2007. History of hypothyroidism. History of Chiari I malformation. Had angioedema of the lip in 2010. History of hyperplastic colon polyps, migraine headaches, vitamin D deficiency. Had herpes zoster June 2011. Left knee arthroscopy 2007, appendectomy 1993, bone spur removed from left foot 1999. History of functional constipation for which she takes laxatives 2-3 times a week. Uses Advair inhaler and takes Singulair for asthma.  Patient used to smoke until the age of 19 and then quit.  Family history: Mother died at age 28 of heart disease, status post CABG and diabetes. Father died of cancer and had history of murmurs break down. One sister with history of diabetes and hypertension that is deceased. One sister in good health. 2 brothers in good health.  Social history: Patient is a widow. Husband died of heart problems. No children. She works for [employer] and also volunteers for hospice.    Review of Systems     Objective:   Physical Exam Skin is warm and dry. Nodes none. HEENT exam: No definite nystagmus. PERRLA. Funduscopic exam is benign bilaterally. TMs are clear. Neck is supple. Pharynx is clear. Chest is clear. Cardiac exam regular rate and rhythm. Moves all 4 extremities without problems. Muscle strength is 5 over 5 in all groups tested. She is alert and oriented x3. Cerebellar finger to nose testing is within normal limits. Gait is normal. Cranial nerves II through XII grossly intact. Facial sensation within normal limits.          Assessment & Plan:  Paresthesias of face-  etiology unclear. Possible hyperventilation causing paresthesias.  History of Chiari 1 malformation  Plan: The patient continues to be symptomatic we will refer to neurologist or consider MRI of the brain.

## 2013-06-06 NOTE — Patient Instructions (Addendum)
Perioral numbness. 1.  Will check MRI of brain 2.  Will check B12 level. 3.  Follow up after MRI  You have been scheduled for an MRI at [facility] [address] [place] on 06/12/13. Your appointment time is 2:00 pm . Please arrive 15 minutes prior to your appointment time for registration purposes. There is no prep for this test. However, if you have any metal in your body, have a pacemaker or defibrillator, please be sure to let your ordering physician know. This test typically takes 45 minutes to 1 hour to complete.

## 2013-06-06 NOTE — Progress Notes (Signed)
NEUROLOGY CONSULTATION NOTE  TARNESHA ULLOA MRN: [id number] DOB: [birth year]  Referring provider: Dr. Lenord Fellers Primary care provider: Dr. Lenord Fellers  Reason for consult:  Peri-oral numbness.  HISTORY OF PRESENT ILLNESS: Mckenzie Hebert is a 57 year old right-handed woman with hypothyroidism, asthma, anxiety, migraine, and vitamin D deficiency who presents for evaluation of peri-oral numbness.  Records and images were personally reviewed where available.    Symptoms started about one and a half months ago.  It is a tingling sensation that occurs spontaneously off and on.  It can wake her up at night or occurs during the day.  It can last up to 5 hours.  She denies visual changes, headache, dysarthria, dysphagia, hearing dysfunction, focal numbness or weakness.  One time, last week, she had an episode of lightheadedness (no spinning), exacerbated when bending forward, which lasted 5 hours and resolved.  No nausea.  No other such spells.  MRI Brain w/wo from 06/04/00 for migraine revealed 8mm right cerebellar tonsil and 6mm left cerebellar tonsil displacement below level of foramen magnum.  Actual images not available for review.  11/27/11: Vit D 25-hydroxy 42. 04/24/13: TSH 3.182, Hgb A1c 6.4, mean plasma glucose 137.  PAST MEDICAL HISTORY: Past Medical History  Diagnosis Date  . Bronchitis   . Migraines     Dr Clarisse Gouge  . Colon polyps     Dr Loreta Ave  . Hypothyroid   . AC (acromioclavicular) joint bone spurs 2008    right foot  . Endometrial polyp   . Myoma     PAST SURGICAL HISTORY: Past Surgical History  Procedure Laterality Date  . Appendectomy    . Resection tumor clavicle radical  1985    benign  . Tonsillectomy    . Knee surgery  [id number]    arthroscopic, left X 2  . Hysteroscopy      endo polyp and myoma  . Foot surgery  2002, 2008    bi-lat , bone spurs, achilles tendon work    MEDICATIONS: Current Outpatient Prescriptions on File Prior to Visit  Medication Sig Dispense  Refill  . Cholecalciferol (VITAMIN D3) 1000 UNITS CAPS Take 2,000 Int'l Units by mouth.       . cyclobenzaprine (FLEXERIL) 10 MG tablet as needed.       Marland Kitchen esomeprazole (NEXIUM) 40 MG capsule Take 1 capsule (40 mg total) by mouth daily before breakfast.  30 capsule  11  . PROAIR HFA 108 (90 BASE) MCG/ACT inhaler INHALE 2 SPRAY BY MOUTH 4 TIMES A DAY AS NEEDED FOR 1 YEAR  17 g  11  . promethazine (PHENERGAN) 25 MG tablet TAKE 1 TABLET BY MOUTH EVERY 4 HOURS AS NEEDED FOR NAUSEA  30 tablet  0  . SYNTHROID 112 MCG tablet TAKE 1 TABLET BY MOUTH EVERY DAY  30 tablet  5  . hydrocodone-acetaminophen (LORCET-HD) 5-500 MG per capsule Take 1 capsule by mouth every 6 (six) hours as needed.        Marland Kitchen LORazepam (ATIVAN) 1 MG tablet       . SUMAtriptan Succinate (IMITREX PO) Take by mouth.         No current facility-administered medications on file prior to visit.    ALLERGIES: No Known Allergies  FAMILY HISTORY: Family History  Problem Relation Age of Onset  . Diabetes Mother   . Hypertension Mother   . Heart disease Mother   . Cancer Father     lung cancer  . Diabetes Sister   . Hypertension Sister   .  Thyroid disease Sister     hyperthyroidism  . Breast cancer Paternal Aunt   . Leukemia Paternal Grandmother   . Mental retardation Other   . Hypertension Sister   . Autoimmune disease Sister     SOCIAL HISTORY: History   Social History  . Marital Status: Widowed    Spouse Name: N/A    Number of Children: N/A  . Years of Education: N/A   Occupational History  . Not on file.   Social History Main Topics  . Smoking status: Former Games developer  . Smokeless tobacco: Never Used  . Alcohol Use: No  . Drug Use: No  . Sexual Activity: No   Other Topics Concern  . Not on file   Social History Narrative  . No narrative on file    REVIEW OF SYSTEMS: Constitutional: No fevers, chills, or sweats, no generalized fatigue, change in appetite Eyes: No visual changes, double vision, eye  pain Ear, nose and throat: No hearing loss, ear pain, nasal congestion, sore throat Cardiovascular: No chest pain, palpitations Respiratory:  No shortness of breath at rest or with exertion, wheezes GastrointestinaI: No nausea, vomiting, diarrhea, abdominal pain, fecal incontinence Genitourinary:  No dysuria, urinary retention or frequency Musculoskeletal:  No neck pain, back pain Integumentary: No rash, pruritus, skin lesions Neurological: as above Psychiatric: No depression, insomnia, anxiety Endocrine: No palpitations, fatigue, diaphoresis, mood swings, change in appetite, change in weight, increased thirst Hematologic/Lymphatic:  No anemia, purpura, petechiae. Allergic/Immunologic: no itchy/runny eyes, nasal congestion, recent allergic reactions, rashes  PHYSICAL EXAM: Filed Vitals:   06/06/13 1056  BP: 120/70  Pulse: 72  Temp: 98.6 F (37 C)   General: No acute distress Head:  Normocephalic/atraumatic Neck: supple, no paraspinal tenderness, full range of motion Back: No paraspinal tenderness Heart: regular rate and rhythm Lungs: Clear to auscultation bilaterally. Vascular: No carotid bruits. Neurological Exam: Mental status: alert and oriented to person, place, and time, speech fluent and not dysarthric, language intact. Cranial nerves: CN I: not tested CN II: pupils equal, round and reactive to light, visual fields intact, fundi unremarkable. CN III, IV, VI:  full range of motion, no nystagmus, no ptosis CN V: facial sensation intact CN VII: upper and lower face symmetric CN VIII: hearing intact CN IX, X: gag intact, uvula midline CN XI: sternocleidomastoid and trapezius muscles intact CN XII: tongue midline Bulk & Tone: normal, no fasciculations. Motor: 5/5 throughout Sensation: reduced temperature in toes.  Vibration intact. Deep Tendon Reflexes: 2+ throughout, toes down Finger to nose testing: normal without dysmetria Heel to shin: normal without  dysmetria Gait: normal stance and stride, able to walk on toes, heels and in tandem. Romberg negative.  IMPRESSION: 1.  Peri-oral numbness 2.  History of Chiari Malformation on MRI  I am not really sure if the two are related.  Peri-oral numbness may be seen in some pontine lesions, but she has absolutely no other brainstem (or cerebellar) symptoms.  She does not have symptoms consistent with symptomatic Chiari malformation.  PLAN: 1.  MRI of brain to look for evidence of brainstem lesion and to re-evaluate Chiari 2.  Will check B12 and methylmalonic acid levels. 3.  Follow up after MRI.  45 minutes spent with patient, over 50% spent counseling and coordinating care.  Thank you for allowing me to take part in the care of this patient.  Shon Millet, DO  CC:  Marlan Palau, MD

## 2013-06-06 NOTE — Patient Instructions (Addendum)
Call if symptoms persist

## 2013-06-07 ENCOUNTER — Telehealth: Payer: Self-pay | Admitting: Neurology

## 2013-06-07 NOTE — Telephone Encounter (Signed)
Left a vm message stating her B12 level was normal.

## 2013-06-07 NOTE — Telephone Encounter (Signed)
Message copied by Benay Spice on Wed Jun 07, 2013  3:46 PM ------      Message from: JAFFE, ADAM R      Created: Tue Jun 06, 2013  2:30 PM       B12 level is normal.      ----- Message -----         From: [facility]         Sent: 06/06/2013   2:17 PM           To: Cira Servant, DO                   ------

## 2013-06-08 ENCOUNTER — Other Ambulatory Visit: Payer: Self-pay | Admitting: Internal Medicine

## 2013-06-08 LAB — METHYLMALONIC ACID, SERUM: Methylmalonic Acid, Quant: 0.12 umol/L (ref <0.40)

## 2013-06-08 NOTE — Telephone Encounter (Signed)
Refill x 6 months 

## 2013-06-12 ENCOUNTER — Ambulatory Visit
Admission: RE | Admit: 2013-06-12 | Discharge: 2013-06-12 | Disposition: A | Discharge status: Home / Self Care | Payer: PRIVATE HEALTH INSURANCE | Source: Ambulatory Visit | Attending: Neurology | Admitting: Neurology

## 2013-06-12 DIAGNOSIS — R200 Anesthesia of skin: Secondary | ICD-10-CM

## 2013-06-17 ENCOUNTER — Other Ambulatory Visit: Payer: Self-pay | Admitting: Internal Medicine

## 2013-06-17 NOTE — Telephone Encounter (Signed)
Refill x 6 months 

## 2013-06-18 NOTE — Telephone Encounter (Signed)
Refill x 6 months 

## 2013-06-19 ENCOUNTER — Encounter: Payer: Self-pay | Admitting: Neurology

## 2013-06-19 ENCOUNTER — Ambulatory Visit (INDEPENDENT_AMBULATORY_CARE_PROVIDER_SITE_OTHER): Payer: PRIVATE HEALTH INSURANCE | Admitting: Neurology

## 2013-06-19 VITALS — BP 124/74 | HR 80 | Temp 98.2°F | Ht 67.0 in | Wt 279.0 lb

## 2013-06-19 DIAGNOSIS — R200 Anesthesia of skin: Secondary | ICD-10-CM

## 2013-06-19 DIAGNOSIS — R209 Unspecified disturbances of skin sensation: Secondary | ICD-10-CM

## 2013-06-19 NOTE — Progress Notes (Signed)
NEUROLOGY FOLLOW UP OFFICE NOTE  Mckenzie Hebert [id number]  HISTORY OF PRESENT ILLNESS: Mckenzie Hebert is a 57 year old right-handed woman with hypothyroidism, asthma, anxiety, migraine, and vitamin D deficiency who presents for follow up of peri-oral numbness.  Records and images were personally reviewed where available.    Symptoms started about one and a half months ago.  It is a tingling sensation that occurs spontaneously off and on.  It can wake her up at night or occurs during the day.  It can last up to 5 hours.  She denies visual changes, headache, dysarthria, dysphagia, hearing dysfunction, focal numbness or weakness.  One time, last week, she had an episode of lightheadedness (no spinning), exacerbated when bending forward, which lasted 5 hours and resolved.  No nausea.  No other such spells.  Since last visit, symptoms have improved.  She usually takes Xanax before bed and drinks an energy drink at work.  She stopped this a week ago and feels this may have contributed to improved symptoms.  MRI Brain w/wo from 06/04/00 for migraine revealed 8mm right cerebellar tonsil and 6mm left cerebellar tonsil displacement below level of foramen magnum.  Actual images not available for review.  11/27/11: Vit D 25-hydroxy 42. 04/24/13: TSH 3.182, Hgb A1c 6.4, mean plasma glucose 137. 06/06/13:  B12 408 06/12/13 repeat MRI brain performed, which was normal.  There was no evidence of a Chiari malformation.  No brainstem/pontine lesion to explain peri-oral numbness.Marland Kitchen  PAST MEDICAL HISTORY: Past Medical History  Diagnosis Date  . Bronchitis   . Migraines     Dr Clarisse Gouge  . Colon polyps     Dr Loreta Ave  . Hypothyroid   . AC (acromioclavicular) joint bone spurs 2008    right foot  . Endometrial polyp   . Myoma     MEDICATIONS: Current Outpatient Prescriptions on File Prior to Visit  Medication Sig Dispense Refill  . ALPRAZolam (XANAX) 1 MG tablet TAKE 1 TABLET BY MOUTH AT BEDTIME  30 tablet  5  .  Cholecalciferol (VITAMIN D3) 1000 UNITS CAPS Take 2,000 Int'l Units by mouth.       . cyclobenzaprine (FLEXERIL) 10 MG tablet as needed.       Marland Kitchen esomeprazole (NEXIUM) 40 MG capsule Take 1 capsule (40 mg total) by mouth daily before breakfast.  30 capsule  11  . PROAIR HFA 108 (90 BASE) MCG/ACT inhaler INHALE 2 SPRAYS BY MOUTH 4 TIMES A DAY AS NEEDED FOR 1 YEAR  17 each  11  . promethazine (PHENERGAN) 25 MG tablet TAKE 1 TABLET BY MOUTH EVERY 4 HOURS AS NEEDED FOR NAUSEA  30 tablet  0  . SYNTHROID 112 MCG tablet TAKE 1 TABLET BY MOUTH EVERY DAY  30 tablet  5   No current facility-administered medications on file prior to visit.    ALLERGIES: No Known Allergies  FAMILY HISTORY: Family History  Problem Relation Age of Onset  . Diabetes Mother   . Hypertension Mother   . Heart disease Mother   . Cancer Father     lung cancer  . Diabetes Sister   . Hypertension Sister   . Thyroid disease Sister     hyperthyroidism  . Breast cancer Paternal Aunt   . Leukemia Paternal Grandmother   . Mental retardation Other   . Hypertension Sister   . Autoimmune disease Sister     SOCIAL HISTORY: History   Social History  . Marital Status: Widowed    Spouse Name:  N/A    Number of Children: N/A  . Years of Education: N/A   Occupational History  . Not on file.   Social History Main Topics  . Smoking status: Former Games developer  . Smokeless tobacco: Never Used  . Alcohol Use: No  . Drug Use: No  . Sexual Activity: No   Other Topics Concern  . Not on file   Social History Narrative  . No narrative on file    PHYSICAL EXAM: Filed Vitals:   06/19/13 1539  BP: 124/74  Pulse: 80  Temp: 98.2 F (36.8 C)   General: No acute distress Head:  Normocephalic/atraumatic Neck: supple, no paraspinal tenderness, full range of motion Back: No paraspinal tenderness Neurological Exam: alert and oriented to person, place, and time, speech fluent and not dysarthric, language intact.  CN II-XII  intact, bulk and tone normal, muscle strength 5/5 throughout, sensation to light touch intact, deep tendon reflexes 1+ throughout, finger to nose intact, gait normal.  IMPRESSION: Perioral numbness.  Etiology unknown.  Symptoms resolving.  It may be secondary to stress.  Serious etiologies ruled out.  PLAN: No further workup.  Follow up as needed.  15 minutes spent with patient, over 50% spent counseling and coordinating care.  Shon Millet, DO  CC:  Marlan Palau, MD

## 2013-06-19 NOTE — Patient Instructions (Addendum)
I can't find a neurological cause of the facial numbness, but we ruled out serious causes, which is good.  It seems to be improving, which is good.  MRI of brain was normal.  No further workup necessary.  Follow up as needed.  Call with questions or concerns.

## 2013-07-31 ENCOUNTER — Encounter: Payer: Self-pay | Admitting: Internal Medicine

## 2013-07-31 ENCOUNTER — Ambulatory Visit (INDEPENDENT_AMBULATORY_CARE_PROVIDER_SITE_OTHER): Payer: PRIVATE HEALTH INSURANCE | Admitting: Internal Medicine

## 2013-07-31 VITALS — BP 140/88 | HR 80 | Temp 99.5°F | Ht 68.0 in | Wt 277.0 lb

## 2013-07-31 DIAGNOSIS — J029 Acute pharyngitis, unspecified: Secondary | ICD-10-CM

## 2013-07-31 DIAGNOSIS — J069 Acute upper respiratory infection, unspecified: Secondary | ICD-10-CM

## 2013-07-31 LAB — POCT RAPID STREP A (OFFICE): Rapid Strep A Screen: NEGATIVE

## 2013-07-31 MED ORDER — AZITHROMYCIN 250 MG PO TABS: ORAL_TABLET | ORAL | Status: DC

## 2013-07-31 MED ORDER — HYDROCODONE-HOMATROPINE 5-1.5 MG/5ML PO SYRP: 5.0000 mL | ORAL_SOLUTION | Freq: Three times a day (TID) | ORAL | Status: DC | PRN

## 2013-07-31 NOTE — Progress Notes (Signed)
  Subjective:    Patient ID: Mckenzie Hebert, female    DOB: [birth year], 57 y.o.   MRN: [id number]  HPI Onset Saturday night cough, myalgias with onset yesterday of sore throat. Discolored sputum. Some wheezing but has inhaler.  Has malaise and fatigue. Had shaking chills yesterday.    Review of Systems     Objective:   Physical Exam TMs chronically scarred . Pharynx red. Rapid strep screen negative. Neck supple. Chest clear.        Assessment & Plan:  Pharyngitis- non strep Acute URI  Plan: Zithromax Z-Pak 2 po day one then one po days. Hycodan Syrup one tsp po q 8 hours prn pain

## 2013-07-31 NOTE — Patient Instructions (Signed)
Take Zithromax Z pak as directed. Take Hycodan as directed for cough

## 2013-09-01 ENCOUNTER — Other Ambulatory Visit: Payer: Self-pay | Admitting: Internal Medicine

## 2013-10-27 ENCOUNTER — Encounter: Payer: Self-pay | Admitting: Internal Medicine

## 2013-10-27 ENCOUNTER — Ambulatory Visit (INDEPENDENT_AMBULATORY_CARE_PROVIDER_SITE_OTHER): Payer: PRIVATE HEALTH INSURANCE | Admitting: Internal Medicine

## 2013-10-27 VITALS — BP 132/90 | HR 88 | Temp 99.0°F | Ht 66.75 in | Wt 286.0 lb

## 2013-10-27 DIAGNOSIS — E039 Hypothyroidism, unspecified: Secondary | ICD-10-CM

## 2013-10-27 DIAGNOSIS — E119 Type 2 diabetes mellitus without complications: Secondary | ICD-10-CM

## 2013-10-27 DIAGNOSIS — E669 Obesity, unspecified: Secondary | ICD-10-CM

## 2013-10-27 LAB — HEMOGLOBIN A1C
Hgb A1c MFr Bld: 6.8 % — ABNORMAL HIGH (ref <5.7)
Mean Plasma Glucose: 148 mg/dL — ABNORMAL HIGH (ref <117)

## 2013-10-27 LAB — TSH: TSH: 3.707 u[IU]/mL (ref 0.350–4.500)

## 2013-10-31 ENCOUNTER — Other Ambulatory Visit: Payer: Self-pay

## 2013-10-31 MED ORDER — LEVOTHYROXINE SODIUM 125 MCG PO TABS
125.0000 ug | ORAL_TABLET | Freq: Every day | ORAL | Status: DC
Start: 2013-10-31 — End: 2014-05-22

## 2013-11-05 ENCOUNTER — Encounter: Payer: Self-pay | Admitting: Internal Medicine

## 2013-11-05 DIAGNOSIS — E669 Obesity, unspecified: Secondary | ICD-10-CM | POA: Insufficient documentation

## 2013-11-05 DIAGNOSIS — E1165 Type 2 diabetes mellitus with hyperglycemia: Secondary | ICD-10-CM | POA: Insufficient documentation

## 2013-11-05 DIAGNOSIS — E119 Type 2 diabetes mellitus without complications: Secondary | ICD-10-CM | POA: Insufficient documentation

## 2013-11-05 NOTE — Progress Notes (Signed)
   Subjective:    Patient ID: Mckenzie Hebert, female    DOB: [birth year], 58 y.o.   MRN: [id number]  HPI 58 year old female in today to followup on hypothyroidism. Wants to discuss weight loss surgery. Realizes that she has not been motivated to diet and exercise. Lives alone. Sometimes is depressed. Has been a lot of her time in the past taking care of other people. 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 Continues to work full-time. Is tired when she gets home. History of diet controlled type 2 diabetes mellitus.    Review of Systems     Objective:   Physical Exam neck is supple without JVD thyromegaly or carotid bruits. Chest clear to auscultation. Cardiac exam regular rate and rhythm normal S1 and S2. Extremities without edema.        Assessment & Plan:   Obesity-patient that she may want to consider bariatric surgery. She may contact [facility] to learn more about this.  Hypothyroidism-TSH is in the 3 range with Synthroid 0.112 mg daily. Increase Synthroid to 0.125 mg daily and repeat TSH in 6-8 weeks.  Type 2 diabetes mellitus-currently diet controlled. Recheck after trial of diet and exercise in 6 months. Needs to follow a diabetic diet and try to exercise.  25 minutes spent with patient

## 2013-11-05 NOTE — Patient Instructions (Signed)
Increase Synthroid to 0.125 mg daily and return for TSH only in 6-8 weeks. Please call [facility] to learn more about weight loss surgery if you're interested in it. Please try the diet exercise. Hemoglobin A1c is elevated at 6.8%.

## 2013-11-20 ENCOUNTER — Encounter: Payer: PRIVATE HEALTH INSURANCE | Admitting: Women's Health

## 2013-12-26 ENCOUNTER — Other Ambulatory Visit: Payer: PRIVATE HEALTH INSURANCE | Admitting: Internal Medicine

## 2013-12-26 DIAGNOSIS — E039 Hypothyroidism, unspecified: Secondary | ICD-10-CM

## 2013-12-26 LAB — TSH: TSH: 1.643 u[IU]/mL (ref 0.350–4.500)

## 2014-01-03 ENCOUNTER — Encounter: Payer: Self-pay | Admitting: Women's Health

## 2014-01-03 ENCOUNTER — Other Ambulatory Visit (HOSPITAL_COMMUNITY)
Admission: RE | Admit: 2014-01-03 | Discharge: 2014-01-03 | Disposition: A | Discharge status: Home / Self Care | Payer: PRIVATE HEALTH INSURANCE | Source: Ambulatory Visit | Attending: Gynecology | Admitting: Gynecology

## 2014-01-03 ENCOUNTER — Ambulatory Visit (INDEPENDENT_AMBULATORY_CARE_PROVIDER_SITE_OTHER): Payer: PRIVATE HEALTH INSURANCE | Admitting: Women's Health

## 2014-01-03 VITALS — BP 126/76 | Ht 66.5 in | Wt 268.0 lb

## 2014-01-03 DIAGNOSIS — Z01419 Encounter for gynecological examination (general) (routine) without abnormal findings: Secondary | ICD-10-CM | POA: Insufficient documentation

## 2014-01-03 DIAGNOSIS — Z833 Family history of diabetes mellitus: Secondary | ICD-10-CM

## 2014-01-03 NOTE — Progress Notes (Signed)
Mckenzie Hebert 07/26/1956 [id number]    History:    Presents for annual exam.  Postmenopausal/no bleeding/no HRT. Not sexually active husband died 01-04-07 and still struggling with his death.  01-03-06 benign  colon polyp was recommended to come back in 10 years. Normal Pap and mammogram history. Jan 03, 2009 DEXA T score hip 0.9. 06/2013 with having headaches with some numbness had a normal brain MRI. Hypothyroid primary care manages. Last hemoglobin A1c with elevated.  Past medical history, past surgical history, family history and social history were all reviewed and documented in the EPIC chart. Works [employer]. Mother  diabetes, hypertension, stroke.  ROS:  A 12 POINT ROS was performed and pertinent positives and negatives are included.  Exam:  Filed Vitals:   01/03/14 1504  BP: 126/76    General appearance:  Normal Thyroid:  Symmetrical, normal in size, without palpable masses or nodularity. Respiratory  Auscultation:  Clear without wheezing or rhonchi Cardiovascular  Auscultation:  Regular rate, without rubs, murmurs or gallops  Edema/varicosities:  Not grossly evident Abdominal  Soft,nontender, without masses, guarding or rebound.  Liver/spleen:  No organomegaly noted  Hernia:  None appreciated  Skin  Inspection:  Grossly normal   Breasts: Examined lying and sitting.     Right: Without masses, retractions, discharge or axillary adenopathy.     Left: Without masses, retractions, discharge or axillary adenopathy. Gentitourinary   Inguinal/mons:  Normal without inguinal adenopathy  External genitalia:  Normal  BUS/Urethra/Skene's glands:  Normal  Vagina:  Normal  Cervix:  Normal  Uterus:   normal in size, shape and contour.  Midline and mobile  Adnexa/parametria:     Rt: Without masses or tenderness.   Lt: Without masses or tenderness.  Anus and perineum: Normal  Digital rectal exam: Normal sphincter tone without palpated masses or tenderness  Assessment/Plan:  58  y.o. WWF G0 for annual exam.    Postmenopausal/no HRT/no bleeding Hypothyroid primary care manages labs and meds Obesity  Plan: Reviewed counseling, continues to struggle with death of husband and now has lost a dog. SBE's, continue annual mammogram 3-D tomography reviewed and encouraged history and and stress. Normal DEXA 01-03-2009 will repeat to check for stability. Continue active lifestyle, decrease calories for weight loss.. Home safety and fall prevention discussed. Hemoglobin A1c will take results to primary care for followup. Pap normal 01/04/2011, new screening guidelines reviewed.   [place], 5:28 PM 01/03/2014

## 2014-01-03 NOTE — Patient Instructions (Signed)
Health Recommendations for Postmenopausal Women Respected and ongoing research has looked at the most common causes of death, disability, and poor quality of life in postmenopausal women. The causes include heart disease, diseases of blood vessels, diabetes, depression, cancer, and bone loss (osteoporosis). Many things can be done to help lower the chances of developing these and other common problems: CARDIOVASCULAR DISEASE Heart Disease: A heart attack is a medical emergency. Know the signs and symptoms of a heart attack. Below are things women can do to reduce their risk for heart disease.   Do not smoke. If you smoke, quit.  Aim for a healthy weight. Being overweight causes many preventable deaths. Eat a healthy and balanced diet and drink an adequate amount of liquids.  Get moving. Make a commitment to be more physically active. Aim for 30 minutes of activity on most, if not all days of the week.  Eat for heart health. Choose a diet that is low in saturated fat and cholesterol and eliminate trans fat. Include whole grains, vegetables, and fruits. Read and understand the labels on food containers before buying.  Know your numbers. Ask your caregiver to check your blood pressure, cholesterol (total, HDL, LDL, triglycerides) and blood glucose. Work with your caregiver on improving your entire clinical picture.  High blood pressure. Limit or stop your table salt intake (try salt substitute and food seasonings). Avoid salty foods and drinks. Read labels on food containers before buying. Eating well and exercising can help control high blood pressure. STROKE  Stroke is a medical emergency. Stroke may be the result of a blood clot in a blood vessel in the brain or by a brain hemorrhage (bleeding). Know the signs and symptoms of a stroke. To lower the risk of developing a stroke:  Avoid fatty foods.  Quit smoking.  Control your diabetes, blood pressure, and irregular heart rate. THROMBOPHLEBITIS  (BLOOD CLOT) OF THE LEG  Becoming overweight and leading a stationary lifestyle may also contribute to developing blood clots. Controlling your diet and exercising will help lower the risk of developing blood clots. CANCER SCREENING  Breast Cancer: Take steps to reduce your risk of breast cancer.  You should practice "breast self-awareness." This means understanding the normal appearance and feel of your breasts and should include breast self-examination. Any changes detected, no matter how small, should be reported to your caregiver.  After age 40, you should have a clinical breast exam (CBE) every year.  Starting at age 40, you should consider having a mammogram (breast X-ray) every year.  If you have a family history of breast cancer, talk to your caregiver about genetic screening.  If you are at high risk for breast cancer, talk to your caregiver about having an MRI and a mammogram every year.  Intestinal or Stomach Cancer: Tests to consider are a rectal exam, fecal occult blood, sigmoidoscopy, and colonoscopy. Women who are high risk may need to be screened at an earlier age and more often.  Cervical Cancer:  Beginning at age 30, you should have a Pap test every 3 years as long as the past 3 Pap tests have been normal.  If you have had past treatment for cervical cancer or a condition that could lead to cancer, you need Pap tests and screening for cancer for at least 20 years after your treatment.  If you had a hysterectomy for a problem that was not cancer or a condition that could lead to cancer, then you no longer need Pap tests.    If you are between ages 65 and 70, and you have had normal Pap tests going back 10 years, you no longer need Pap tests.  If Pap tests have been discontinued, risk factors (such as a new sexual partner) need to be reassessed to determine if screening should be resumed.  Some medical problems can increase the chance of getting cervical cancer. In these  cases, your caregiver may recommend more frequent screening and Pap tests.  Uterine Cancer: If you have vaginal bleeding after reaching menopause, you should notify your caregiver.  Ovarian cancer: Other than yearly pelvic exams, there are no reliable tests available to screen for ovarian cancer at this time except for yearly pelvic exams.  Lung Cancer: Yearly chest X-rays can detect lung cancer and should be done on high risk women, such as cigarette smokers and women with chronic lung disease (emphysema).  Skin Cancer: A complete body skin exam should be done at your yearly examination. Avoid overexposure to the sun and ultraviolet light lamps. Use a strong sun block cream when in the sun. All of these things are important in lowering the risk of skin cancer. MENOPAUSE Menopause Symptoms: Hormone therapy products are effective for treating symptoms associated with menopause:  Moderate to severe hot flashes.  Night sweats.  Mood swings.  Headaches.  Tiredness.  Loss of sex drive.  Insomnia.  Other symptoms. Hormone replacement carries certain risks, especially in older women. Women who use or are thinking about using estrogen or estrogen with progestin treatments should discuss that with their caregiver. Your caregiver will help you understand the benefits and risks. The ideal dose of hormone replacement therapy is not known. The Food and Drug Administration (FDA) has concluded that hormone therapy should be used only at the lowest doses and for the shortest amount of time to reach treatment goals.  OSTEOPOROSIS Protecting Against Bone Loss and Preventing Fracture: If you use hormone therapy for prevention of bone loss (osteoporosis), the risks for bone loss must outweigh the risk of the therapy. Ask your caregiver about other medications known to be safe and effective for preventing bone loss and fractures. To guard against bone loss or fractures, the following is recommended:  If  you are less than age 50, take 1000 mg of calcium and at least 600 mg of Vitamin D per day.  If you are greater than age 50 but less than age 70, take 1200 mg of calcium and at least 600 mg of Vitamin D per day.  If you are greater than age 70, take 1200 mg of calcium and at least 800 mg of Vitamin D per day. Smoking and excessive alcohol intake increases the risk of osteoporosis. Eat foods rich in calcium and vitamin D and do weight bearing exercises several times a week as your caregiver suggests. DIABETES Diabetes Melitus: If you have Type I or Type 2 diabetes, you should keep your blood sugar under control with diet, exercise and recommended medication. Avoid too many sweets, starchy and fatty foods. Being overweight can make control more difficult. COGNITION AND MEMORY Cognition and Memory: Menopausal hormone therapy is not recommended for the prevention of cognitive disorders such as Alzheimer's disease or memory loss.  DEPRESSION  Depression may occur at any age, but is common in elderly women. The reasons may be because of physical, medical, social (loneliness), or financial problems and needs. If you are experiencing depression because of medical problems and control of symptoms, talk to your caregiver about this. Physical activity and   exercise may help with mood and sleep. Community and volunteer involvement may help your sense of value and worth. If you have depression and you feel that the problem is getting worse or becoming severe, talk to your caregiver about treatment options that are best for you. ACCIDENTS  Accidents are common and can be serious in the elderly woman. Prepare your house to prevent accidents. Eliminate throw rugs, place hand bars in the bath, shower and toilet areas. Avoid wearing high heeled shoes or walking on wet, snowy, and icy areas. Limit or stop driving if you have vision or hearing problems, or you feel you are unsteady with you movements and  reflexes. HEPATITIS C Hepatitis C is a type of viral infection affecting the liver. It is spread mainly through contact with blood from an infected person. It can be treated, but if left untreated, it can lead to severe liver damage over years. Many people who are infected do not know that the virus is in their blood. If you are a "baby-boomer", it is recommended that you have one screening test for Hepatitis C. IMMUNIZATIONS  Several immunizations are important to consider having during your senior years, including:   Tetanus, diptheria, and pertussis booster shot.  Influenza every year before the flu season begins.  Pneumonia vaccine.  Shingles vaccine.  Others as indicated based on your specific needs. Talk to your caregiver about these. Document Released: 10/16/2005 Document Revised: 08/10/2012 Document Reviewed: 06/11/2008 [employer] Patient Information 2014 [employer].  

## 2014-01-04 LAB — HEMOGLOBIN A1C
Hgb A1c MFr Bld: 5.4 % (ref <5.7)
Mean Plasma Glucose: 108 mg/dL (ref <117)

## 2014-02-09 ENCOUNTER — Other Ambulatory Visit: Payer: BC Managed Care – PPO | Admitting: Anesthesiology

## 2014-02-09 DIAGNOSIS — Z1211 Encounter for screening for malignant neoplasm of colon: Secondary | ICD-10-CM

## 2014-02-22 ENCOUNTER — Other Ambulatory Visit: Payer: Self-pay | Admitting: Internal Medicine

## 2014-03-05 ENCOUNTER — Other Ambulatory Visit: Payer: Self-pay

## 2014-03-05 DIAGNOSIS — Z1231 Encounter for screening mammogram for malignant neoplasm of breast: Secondary | ICD-10-CM

## 2014-04-05 ENCOUNTER — Encounter: Payer: Self-pay | Admitting: Gastroenterology

## 2014-04-09 ENCOUNTER — Ambulatory Visit
Admission: RE | Admit: 2014-04-09 | Discharge: 2014-04-09 | Disposition: A | Discharge status: Home / Self Care | Payer: BC Managed Care – PPO | Source: Ambulatory Visit

## 2014-04-09 DIAGNOSIS — Z1231 Encounter for screening mammogram for malignant neoplasm of breast: Secondary | ICD-10-CM

## 2014-04-17 ENCOUNTER — Other Ambulatory Visit: Payer: Self-pay | Admitting: Internal Medicine

## 2014-04-30 ENCOUNTER — Other Ambulatory Visit: Payer: Self-pay | Admitting: Internal Medicine

## 2014-04-30 ENCOUNTER — Other Ambulatory Visit: Payer: BC Managed Care – PPO | Admitting: Internal Medicine

## 2014-04-30 DIAGNOSIS — E039 Hypothyroidism, unspecified: Secondary | ICD-10-CM

## 2014-04-30 DIAGNOSIS — Z130 Encounter for screening for diseases of the blood and blood-forming organs and certain disorders involving the immune mechanism: Secondary | ICD-10-CM

## 2014-04-30 DIAGNOSIS — Z0000 Encounter for general adult medical examination without abnormal findings: Secondary | ICD-10-CM

## 2014-04-30 DIAGNOSIS — E559 Vitamin D deficiency, unspecified: Secondary | ICD-10-CM

## 2014-04-30 DIAGNOSIS — E119 Type 2 diabetes mellitus without complications: Secondary | ICD-10-CM

## 2014-04-30 LAB — CBC WITH DIFFERENTIAL/PLATELET
Basophils Absolute: 0.1 10*3/uL (ref 0.0–0.1)
Basophils Relative: 1 % (ref 0–1)
Eosinophils Absolute: 0.3 10*3/uL (ref 0.0–0.7)
Eosinophils Relative: 5 % (ref 0–5)
HCT: 36.8 % (ref 36.0–46.0)
Hemoglobin: 12.7 g/dL (ref 12.0–15.0)
Lymphocytes Relative: 23 % (ref 12–46)
Lymphs Abs: 1.6 10*3/uL (ref 0.7–4.0)
MCH: 26.6 pg (ref 26.0–34.0)
MCHC: 34.5 g/dL (ref 30.0–36.0)
MCV: 77.1 fL — ABNORMAL LOW (ref 78.0–100.0)
Monocytes Absolute: 0.4 10*3/uL (ref 0.1–1.0)
Monocytes Relative: 6 % (ref 3–12)
Neutro Abs: 4.4 10*3/uL (ref 1.7–7.7)
Neutrophils Relative %: 65 % (ref 43–77)
Platelets: 296 10*3/uL (ref 150–400)
RBC: 4.77 MIL/uL (ref 3.87–5.11)
RDW: 15.1 % (ref 11.5–15.5)
WBC: 6.8 10*3/uL (ref 4.0–10.5)

## 2014-04-30 LAB — TSH: TSH: 3.129 u[IU]/mL (ref 0.350–4.500)

## 2014-04-30 LAB — COMPREHENSIVE METABOLIC PANEL
ALT: 38 U/L — ABNORMAL HIGH (ref 0–35)
AST: 33 U/L (ref 0–37)
Albumin: 4.3 g/dL (ref 3.5–5.2)
Alkaline Phosphatase: 68 U/L (ref 39–117)
BUN: 13 mg/dL (ref 6–23)
CO2: 28 mEq/L (ref 19–32)
Calcium: 9.3 mg/dL (ref 8.4–10.5)
Chloride: 104 mEq/L (ref 96–112)
Creat: 0.67 mg/dL (ref 0.50–1.10)
Glucose, Bld: 107 mg/dL — ABNORMAL HIGH (ref 70–99)
Potassium: 4.5 mEq/L (ref 3.5–5.3)
Sodium: 138 mEq/L (ref 135–145)
Total Bilirubin: 0.4 mg/dL (ref 0.2–1.2)
Total Protein: 7.4 g/dL (ref 6.0–8.3)

## 2014-04-30 LAB — LIPID PANEL
Cholesterol: 219 mg/dL — ABNORMAL HIGH (ref 0–200)
HDL: 59 mg/dL (ref >39)
LDL Cholesterol: 139 mg/dL — ABNORMAL HIGH (ref 0–99)
Total CHOL/HDL Ratio: 3.7 Ratio
Triglycerides: 104 mg/dL (ref <150)
VLDL: 21 mg/dL (ref 0–40)

## 2014-04-30 LAB — HEMOGLOBIN A1C
Hgb A1c MFr Bld: 6.4 % — ABNORMAL HIGH (ref <5.7)
Mean Plasma Glucose: 137 mg/dL — ABNORMAL HIGH (ref <117)

## 2014-05-01 ENCOUNTER — Encounter: Payer: Self-pay | Admitting: Internal Medicine

## 2014-05-01 ENCOUNTER — Ambulatory Visit (INDEPENDENT_AMBULATORY_CARE_PROVIDER_SITE_OTHER): Payer: BC Managed Care – PPO | Admitting: Internal Medicine

## 2014-05-01 VITALS — BP 120/82 | HR 76 | Ht 66.0 in | Wt 264.0 lb

## 2014-05-01 DIAGNOSIS — E669 Obesity, unspecified: Secondary | ICD-10-CM

## 2014-05-01 DIAGNOSIS — Z8709 Personal history of other diseases of the respiratory system: Secondary | ICD-10-CM

## 2014-05-01 DIAGNOSIS — Z0000 Encounter for general adult medical examination without abnormal findings: Secondary | ICD-10-CM

## 2014-05-01 DIAGNOSIS — K219 Gastro-esophageal reflux disease without esophagitis: Secondary | ICD-10-CM

## 2014-05-01 DIAGNOSIS — Z8669 Personal history of other diseases of the nervous system and sense organs: Secondary | ICD-10-CM

## 2014-05-01 DIAGNOSIS — R7302 Impaired glucose tolerance (oral): Secondary | ICD-10-CM

## 2014-05-01 DIAGNOSIS — Z8639 Personal history of other endocrine, nutritional and metabolic disease: Secondary | ICD-10-CM

## 2014-05-01 DIAGNOSIS — Z87898 Personal history of other specified conditions: Secondary | ICD-10-CM

## 2014-05-01 DIAGNOSIS — I493 Ventricular premature depolarization: Secondary | ICD-10-CM

## 2014-05-01 DIAGNOSIS — R7309 Other abnormal glucose: Secondary | ICD-10-CM

## 2014-05-01 DIAGNOSIS — Z569 Unspecified problems related to employment: Secondary | ICD-10-CM

## 2014-05-01 DIAGNOSIS — E119 Type 2 diabetes mellitus without complications: Secondary | ICD-10-CM

## 2014-05-01 DIAGNOSIS — E039 Hypothyroidism, unspecified: Secondary | ICD-10-CM

## 2014-05-01 DIAGNOSIS — I491 Atrial premature depolarization: Secondary | ICD-10-CM

## 2014-05-01 DIAGNOSIS — Z566 Other physical and mental strain related to work: Secondary | ICD-10-CM

## 2014-05-01 DIAGNOSIS — I4949 Other premature depolarization: Secondary | ICD-10-CM

## 2014-05-01 DIAGNOSIS — G4700 Insomnia, unspecified: Secondary | ICD-10-CM

## 2014-05-01 DIAGNOSIS — F411 Generalized anxiety disorder: Secondary | ICD-10-CM

## 2014-05-01 LAB — POCT URINALYSIS DIPSTICK
Bilirubin, UA: NEGATIVE
Blood, UA: NEGATIVE
Glucose, UA: NEGATIVE
Ketones, UA: NEGATIVE
Leukocytes, UA: NEGATIVE
Nitrite, UA: NEGATIVE
Protein, UA: NEGATIVE
Spec Grav, UA: 1.02
Urobilinogen, UA: NEGATIVE
pH, UA: 6

## 2014-05-01 LAB — IRON AND TIBC
%SAT: 18 % — ABNORMAL LOW (ref 20–55)
Iron: 69 ug/dL (ref 42–145)
TIBC: 386 ug/dL (ref 250–470)
UIBC: 317 ug/dL (ref 125–400)

## 2014-05-01 LAB — VITAMIN D 25 HYDROXY (VIT D DEFICIENCY, FRACTURES): Vit D, 25-Hydroxy: 28 ng/mL — ABNORMAL LOW (ref 30–89)

## 2014-05-01 NOTE — Patient Instructions (Addendum)
Return in 6 months. 24-hour Holter monitor to be applied and cardiology consultation to be obtained.

## 2014-05-02 LAB — MICROALBUMIN, URINE: Microalb, Ur: 0.5 mg/dL (ref 0.00–1.89)

## 2014-05-02 MED ORDER — METFORMIN HCL 500 MG PO TABS: 500.0000 mg | ORAL_TABLET | Freq: Every day | ORAL | Status: DC

## 2014-05-02 MED ORDER — SIMVASTATIN 10 MG PO TABS: 10.0000 mg | ORAL_TABLET | Freq: Every day | ORAL | Status: DC

## 2014-05-02 NOTE — Progress Notes (Signed)
Patient informed. 

## 2014-05-04 ENCOUNTER — Ambulatory Visit (INDEPENDENT_AMBULATORY_CARE_PROVIDER_SITE_OTHER): Payer: BC Managed Care – PPO | Admitting: Podiatrist

## 2014-05-04 ENCOUNTER — Ambulatory Visit (INDEPENDENT_AMBULATORY_CARE_PROVIDER_SITE_OTHER): Payer: BC Managed Care – PPO

## 2014-05-04 ENCOUNTER — Encounter: Payer: Self-pay | Admitting: Podiatrist

## 2014-05-04 VITALS — BP 120/72 | HR 88 | Resp 18

## 2014-05-04 DIAGNOSIS — R52 Pain, unspecified: Secondary | ICD-10-CM

## 2014-05-04 DIAGNOSIS — M21619 Bunion of unspecified foot: Secondary | ICD-10-CM

## 2014-05-04 DIAGNOSIS — M21612 Bunion of left foot: Secondary | ICD-10-CM

## 2014-05-04 DIAGNOSIS — M19079 Primary osteoarthritis, unspecified ankle and foot: Secondary | ICD-10-CM

## 2014-05-04 NOTE — Patient Instructions (Signed)

## 2014-05-04 NOTE — Progress Notes (Signed)
   Subjective:    Patient ID: Mckenzie Hebert, female    DOB: 1956/02/21, 58 y.o.   MRN: [id number]  HPI I HAVE BEEN HAVING SOME PAIN FOR ABOUT 2 MONTHS AND FEELS LIKE A SHARP PAIN AND BURNS AND THROBS AND THERE IS NO NUMBNESS OR TINGLING AND THERE MAY BE SOME SWELLING AND I DO HAVE SOME COLDNESS  Patient presents for chief concern of pain at her left first metatarsalphalangeal joint.  She relates it is not hurting today but at times it will hurt like a sharp and shooting discomfort.  She has had multiple foot surgeries in the past.    Review of Systems     Objective:   Physical Exam Patient is awake, alert, and oriented x 3.  In no acute distress.  Vascular status is intact with palpable pedal pulses at 2/4 DP and PT bilateral and capillary refill time within normal limits. Neurological sensation is also intact bilaterally via Semmes Weinstein monofilament at 5/5 sites. Light touch, vibratory sensation, Achilles tendon reflex is intact. Dermatological exam reveals skin color, turger and texture as normal. No open lesions present.  Musculature intact with dorsiflexion, plantarflexion, inversion, eversion.  Mild bunion is present left.  No pain elicited today's visit.  Rectus foot type is seen.  Mild decreased joint space noted on xray.  Exostosis of posterior calcaneus is present consistent with prior surgery .       Assessment & Plan:  Bunion, arthritis left first mpj  Plan:  Discussed conservative and surgical options.  Recommended orthotics and she be scanned when she is ready.  Also discussed injection versus surgical therapy including a austin bunion correction.  At this time I would recommend exhausting all conservative therapy first.  She will call if interested in orthotics, or injections.

## 2014-05-17 ENCOUNTER — Encounter: Payer: Self-pay | Admitting: *Deleted

## 2014-05-17 ENCOUNTER — Ambulatory Visit (INDEPENDENT_AMBULATORY_CARE_PROVIDER_SITE_OTHER): Payer: BC Managed Care – PPO | Admitting: Radiology

## 2014-05-17 DIAGNOSIS — I4949 Other premature depolarization: Secondary | ICD-10-CM

## 2014-05-17 DIAGNOSIS — I493 Ventricular premature depolarization: Secondary | ICD-10-CM

## 2014-05-17 NOTE — Progress Notes (Signed)
Patient ID: Mckenzie Hebert, female   DOB: 08/10/1956, 58 y.o.   MRN: [id number] E-cardio 24 hour holter monitor applied to patient.

## 2014-05-22 ENCOUNTER — Other Ambulatory Visit: Payer: Self-pay | Admitting: Internal Medicine

## 2014-05-23 ENCOUNTER — Telehealth: Payer: Self-pay

## 2014-05-23 NOTE — Telephone Encounter (Signed)
Patient has appointment with [facility] Oct 19 at 915 am.  Patient aware.

## 2014-05-24 NOTE — Progress Notes (Signed)
Patient ID: Mckenzie Hebert, female   DOB: 10-Jun-1956, 58 y.o.   MRN: [id number] Encounter open in error.

## 2014-05-24 NOTE — Patient Instructions (Signed)
Patient referred to Cardiology

## 2014-05-25 ENCOUNTER — Other Ambulatory Visit: Payer: Self-pay | Admitting: Internal Medicine

## 2014-06-11 ENCOUNTER — Telehealth: Payer: Self-pay

## 2014-06-11 NOTE — Telephone Encounter (Signed)
Patient called requesting heart monitor results.  She has an appointment to discuss results but would like to know what Dr Renold Genta says.  Patient informed of monitor results.  She was advised she is having PVC's and to follow up with cardiologist regarding treatment.

## 2014-06-21 ENCOUNTER — Other Ambulatory Visit: Payer: Self-pay | Admitting: Internal Medicine

## 2014-06-22 ENCOUNTER — Other Ambulatory Visit: Payer: Self-pay

## 2014-06-23 NOTE — Progress Notes (Signed)
Subjective:    Patient ID: Mckenzie Hebert, female    DOB: [birth year], 58 y.o.   MRN: [id number]  HPI  58 year-old White Female in today for health maintenance exam and evaluation of medical issues. Patient has a history of asthma, eczema, hyperplastic colon polyps, insomnia, urticaria, history of migraine headaches, hypothyroidism, history of vitamin D deficiency, history of Chiari I malformation, anxiety, GE reflux. History of impaired glucose tolerance. History of obesity.  Patient had H pylori diagnosed in 2009. Had angioedema of lip in 2010. Had Herpes zoster June 2011.history of asthma treated with Advair and Singulair. [facility] is GYN physician.  Had colonoscopy by Dr. 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 Issues with sleeping.  Left knee arthroscopy 2007. Appendectomy 1993. Bone spur removed from left foot 1999.  Social history: Patient is a widow. Husband died of heart problems. No children. She volunteers with Hospice.she is to smoke until the age of 89 then quit. No alcohol consumption.      Review of Systems  Constitutional: Positive for fatigue.  Cardiovascular: Negative for chest pain and leg swelling.  Gastrointestinal:       GE reflux  Endocrine:       History of hypothyroidism  Neurological:       History of migraine headaches  Psychiatric/Behavioral:       Anxiety and insomnia       Objective:   Physical Exam  Constitutional: She is oriented to person, place, and time. She appears well-developed and well-nourished. No distress.  HENT:  Head: Normocephalic and atraumatic.  Right Ear: External ear normal.  Left Ear: External ear normal.  Nose: Nose normal.  Mouth/Throat: Oropharynx is clear and moist. No oropharyngeal exudate.  Eyes: Conjunctivae and EOM are normal. Pupils are equal, round, and  reactive to light. Right eye exhibits no discharge. Left eye exhibits no discharge. No scleral icterus.  Neck: Neck supple. No JVD present. No tracheal deviation present. No thyromegaly present.  Cardiovascular: Normal heart sounds.   No murmur heard. Irregular rhythm  Pulmonary/Chest: Effort normal and breath sounds normal. No respiratory distress. She has no wheezes. She has no rales. She exhibits no tenderness.  Abdominal: Soft. Bowel sounds are normal. She exhibits no distension and no mass. There is no tenderness. There is no rebound and no guarding.  Genitourinary:  Pap deferred. Pap and pelvic done April 2015 at [facility]  Musculoskeletal: Normal range of motion. She exhibits no edema.  Lymphadenopathy:    She has no cervical adenopathy.  Neurological: She is alert and oriented to person, place, and time. She has normal reflexes. She displays normal reflexes. No cranial nerve deficit.  Skin: Skin is warm and dry. No rash noted. She is not diaphoretic.  Psychiatric: She has a normal mood and affect. Her behavior is normal. Judgment and thought content normal.  Vitals reviewed.         Assessment & Plan:  Premature atrial contractions  Premature ventricular contractions  Anxiety  Insomnia  Work stress  History of GE reflux  History of functional constipation  History of migraine headaches  History of urticaria  Hypothyroidism  History of asthma  History of impaired glucose tolerance  History of vitamin D deficiency  History of Chiari I malformation  History of H. Pylori 2009  Plan: 24-hour Holter monitor and cardiology consultation. TSH is within  normal limits on current dose of thyroid replacement. Patient states that she cannot change  work hours at the present time. Says dayshift not available according to supervisor. Denies chest pain. EKG shows PACs and PVCs that are asymptomatic. I think stress is causing some of this.

## 2014-06-25 ENCOUNTER — Ambulatory Visit: Payer: BC Managed Care – PPO | Admitting: Interventional Cardiology

## 2014-06-28 ENCOUNTER — Institutional Professional Consult (permissible substitution): Payer: BC Managed Care – PPO | Admitting: Cardiology

## 2014-07-09 ENCOUNTER — Encounter: Payer: Self-pay | Admitting: Podiatrist

## 2014-07-17 ENCOUNTER — Ambulatory Visit (INDEPENDENT_AMBULATORY_CARE_PROVIDER_SITE_OTHER): Payer: BC Managed Care – PPO | Admitting: Cardiology

## 2014-07-17 ENCOUNTER — Encounter: Payer: Self-pay | Admitting: Cardiology

## 2014-07-17 VITALS — BP 127/76 | HR 90 | Ht 67.0 in | Wt 271.2 lb

## 2014-07-17 DIAGNOSIS — R002 Palpitations: Secondary | ICD-10-CM

## 2014-07-17 NOTE — Progress Notes (Addendum)
HPI The patient presents for evaluation of tachycardia palpitations. She has no prior cardiac history. However, she's recently had ectopy noted during exam. She wore a Holter and I reviewed these results.  She did have atrial triplets and PVCs. However, she has no symptoms related to these. She works third shift. She doesn't get much sleep. She drinks a fair amount of caffeine and actually uses five-hour energy drink. She denies chest pressure, neck or arm discomfort. She doesn't really notice the lesions except rarely. She's not had any presyncope or syncope. She's had no PND or orthopnea. She's able to work 15 hours shifts and even is able to push a lawnmower which takes her quite a while. With this she denies any cardiovascular symptoms.  No Known Allergies  Current Outpatient Prescriptions  Medication Sig Dispense Refill  . ALPRAZolam (XANAX) 1 MG tablet TAKE 1 TABLET BY MOUTH AT BEDTIME 30 tablet 5  . Cholecalciferol (VITAMIN D3) 1000 UNITS CAPS Take 2,000 Int'l Units by mouth.     . cyclobenzaprine (FLEXERIL) 10 MG tablet as needed.     Marland Kitchen esomeprazole (NEXIUM) 40 MG capsule TAKE 1 CAPSULE (40 MG TOTAL) BY MOUTH DAILY BEFORE BREAKFAST. 30 capsule 10  . levothyroxine (SYNTHROID, LEVOTHROID) 125 MCG tablet TAKE 1 TABLET (125 MCG TOTAL) BY MOUTH DAILY. 30 tablet 5  . PROAIR HFA 108 (90 BASE) MCG/ACT inhaler INHALE 2 SPRAYS BY MOUTH 4 TIMES A DAY AS NEEDED 17 each 5  . promethazine (PHENERGAN) 25 MG tablet TAKE 1 TABLET BY MOUTH EVERY 4 HOURS AS NEEDED FOR NAUSEA 30 tablet 2   No current facility-administered medications for this visit.    Past Medical History  Diagnosis Date  . Bronchitis   . Migraines     Dr Catalina Gravel  . Colon polyps     Dr Collene Mares  . Hypothyroid   . AC (acromioclavicular) joint bone spurs 2008    right foot  . Endometrial polyp   . Uterine fibroid     Past Surgical History  Procedure Laterality Date  . Appendectomy    . Resection tumor clavicle radical  1985   benign  . Tonsillectomy    . Knee surgery  3810,1751    arthroscopic, left X 2  . Hysteroscopy      endo polyp and myoma  . Foot surgery  2002, 2008    bi-lat , bone spurs, achilles tendon work    Family History  Problem Relation Age of Onset  . Diabetes Mother   . Hypertension Mother   . Heart disease Mother 30    CABG  . Cancer Father     lung cancer  . Diabetes Sister   . Hypertension Sister   . Thyroid disease Sister     hyperthyroidism  . Breast cancer Paternal Aunt   . Leukemia Paternal Grandmother   . Mental retardation Other   . Hypertension Sister   . Autoimmune disease Sister     History   Social History  . Marital Status: Widowed    Spouse Name: N/A    Number of Children: N/A  . Years of Education: N/A   Occupational History  . Not on file.   Social History Main Topics  . Smoking status: Former Research scientist (life sciences)  . Smokeless tobacco: Never Used  . Alcohol Use: No  . Drug Use: No  . Sexual Activity: No   Other Topics Concern  . Not on file   Social History Narrative   Lives alone.  ROS:   As stated in the HPI and negative for all other systems.  PHYSICAL EXAM BP 127/76 mmHg  Pulse 90  Ht 5\' 7"  (1.702 m)  Wt 271 lb 3.2 oz (123.016 kg)  BMI 42.47 kg/m2  LMP 03/18/2008  GENERAL:  Well appearing HEENT:  Pupils equal round and reactive, fundi not visualized, oral mucosa unremarkable NECK:  No jugular venous distention, waveform within normal limits, carotid upstroke brisk and symmetric, no bruits, no thyromegaly LYMPHATICS:  No cervical, inguinal adenopathy LUNGS:  Clear to auscultation bilaterally BACK:  No CVA tenderness CHEST:  Unremarkable HEART:  PMI not displaced or sustained,S1 and S2 within normal limits, no S3, no S4, no clicks, no rubs, no murmurs ABD:  Flat, positive bowel sounds normal in frequency in pitch, no bruits, no rebound, no guarding, no midline pulsatile mass, no hepatomegaly, no splenomegaly EXT:  2 plus pulses throughout,  no edema, no cyanosis no clubbing SKIN:  No rashes no nodules NEURO:  Cranial nerves II through XII grossly intact, motor grossly intact throughout [facility]:  Cognitively intact, oriented to person place and time  EKG:  05/01/14.  Sinus rhythm, rate 85, axis within normal limits, intervals within normal limits, incomplete right bundle branch block, premature ventricular contractions, no acute ST-T wave changes.   ASSESSMENT AND PLAN  PVCs:  The patient has PVCs and PACs as documented on a Holter. However, she's not having particular symptoms with these. Therefore, no further cardiovascular testing is suggested. We did discuss at length caffeine as she uses excessive caffeine because she works third shift.  She will try to reduce this. She has symptomatic tachypalpitations she will let me know.

## 2014-07-17 NOTE — Patient Instructions (Signed)
Follow up as needed

## 2014-07-23 ENCOUNTER — Other Ambulatory Visit: Payer: Self-pay | Admitting: Internal Medicine

## 2014-07-23 ENCOUNTER — Other Ambulatory Visit: Payer: BC Managed Care – PPO | Admitting: Internal Medicine

## 2014-07-23 DIAGNOSIS — Z139 Encounter for screening, unspecified: Secondary | ICD-10-CM

## 2014-07-23 DIAGNOSIS — Z13220 Encounter for screening for lipoid disorders: Secondary | ICD-10-CM

## 2014-07-23 LAB — LIPID PANEL
Cholesterol: 182 mg/dL (ref 0–200)
HDL: 52 mg/dL (ref >39)
LDL Cholesterol: 110 mg/dL — ABNORMAL HIGH (ref 0–99)
Total CHOL/HDL Ratio: 3.5 Ratio
Triglycerides: 98 mg/dL (ref <150)
VLDL: 20 mg/dL (ref 0–40)

## 2014-07-23 LAB — HEPATIC FUNCTION PANEL
ALT: 32 U/L (ref 0–35)
AST: 27 U/L (ref 0–37)
Albumin: 3.9 g/dL (ref 3.5–5.2)
Alkaline Phosphatase: 63 U/L (ref 39–117)
Bilirubin, Direct: 0.1 mg/dL (ref 0.0–0.3)
Indirect Bilirubin: 0.3 mg/dL (ref 0.2–1.2)
Total Bilirubin: 0.4 mg/dL (ref 0.2–1.2)
Total Protein: 7.2 g/dL (ref 6.0–8.3)

## 2014-07-23 NOTE — Telephone Encounter (Signed)
Refill x 6 months 

## 2014-07-23 NOTE — Telephone Encounter (Signed)
Xanax called into [facility].

## 2014-07-27 ENCOUNTER — Ambulatory Visit (INDEPENDENT_AMBULATORY_CARE_PROVIDER_SITE_OTHER): Payer: BC Managed Care – PPO | Admitting: Internal Medicine

## 2014-07-27 ENCOUNTER — Telehealth: Payer: Self-pay

## 2014-07-27 ENCOUNTER — Encounter: Payer: Self-pay | Admitting: Internal Medicine

## 2014-07-27 VITALS — BP 128/80 | HR 82 | Temp 97.7°F | Wt 270.0 lb

## 2014-07-27 DIAGNOSIS — E669 Obesity, unspecified: Secondary | ICD-10-CM

## 2014-07-27 DIAGNOSIS — E785 Hyperlipidemia, unspecified: Secondary | ICD-10-CM

## 2014-07-27 DIAGNOSIS — Z23 Encounter for immunization: Secondary | ICD-10-CM

## 2014-07-27 DIAGNOSIS — E039 Hypothyroidism, unspecified: Secondary | ICD-10-CM

## 2014-07-27 DIAGNOSIS — E119 Type 2 diabetes mellitus without complications: Secondary | ICD-10-CM

## 2014-07-27 LAB — HEMOGLOBIN A1C
Hgb A1c MFr Bld: 6.1 % — ABNORMAL HIGH (ref <5.7)
Mean Plasma Glucose: 128 mg/dL — ABNORMAL HIGH (ref <117)

## 2014-07-27 NOTE — Patient Instructions (Addendum)
Return in April for six-month recheck. Had physical exam in August. In April will have hemoglobin A1c, lipid panel, TSH. Encouraged diet exercise and weight loss. Flu vaccine given.

## 2014-07-27 NOTE — Telephone Encounter (Signed)
Called [facility] and added A1C to patient lab.

## 2014-07-27 NOTE — Progress Notes (Signed)
   Subjective:    Patient ID: Mckenzie Hebert, female    DOB: 1956-04-19, 58 y.o.   MRN: [id number]  HPI In today to follow-up on hyperlipidemia and type 2 diabetes mellitus. In August she quit taking Zocor and metformin. She did not like the way she felt on those medications. She has improved her lipid panel just by watching her diet. She is only had 1 cup cake and one Moon pie in 3 months. Recently saw cardiologist regarding PACs and PVCs on EKG after noticing irregular heart rhythm at physical exam. Was thought to have benign arrhythmia. Was told watch caffeine consumption. TSH was normal in August. Hemoglobin A1c pending. Lipid panel has improved with dietary improvements. Not able to exercise all that much. Is working 60 hours a week. When she is off she tries to walk her dog for 2 miles. Is working night shift and that has been stressful.    Review of Systems     Objective:   Physical Exam Skin is warm and dry. Nodes none. No thyromegaly. Chest clear. Cardiac exam occasional irregular contraction. Extremities without edema       Assessment & Plan:  Type 2 diabetes mellitus-hemoglobin A1c pending. Currently not on metformin.  Hypothyroidism-TSH checked in August and is stable. Is on thyroid replacement.  Obesity-is gained 6 pounds since August. Spoke with her at length about diet exercise and weight loss.  History of anxiety  History of asthma-stable  Hyperlipidemia-responded favorably to diet and exercise. However she's gained 6 pounds. Currently not on statin medication. She stopped this in August. She stopped this in August.  PACs and PVCs-seen by Dr.Hochrein and is asymptomatic. Was told watch caffeine consumption by cardiologist.  Plan: At next visit, give Prevnar 13. Flu vaccine given today. Spoke with patient about diet exercise and weight loss.

## 2014-07-30 ENCOUNTER — Telehealth: Payer: Self-pay

## 2014-07-30 NOTE — Telephone Encounter (Signed)
Patient informed of A1C results.

## 2014-07-30 NOTE — Telephone Encounter (Signed)
-----   Message from Elby Showers, MD sent at 07/28/2014 11:04 AM EST ----- Please call pt. AIC much improved.

## 2014-08-24 ENCOUNTER — Ambulatory Visit: Payer: BC Managed Care – PPO | Admitting: Podiatrist

## 2014-08-24 ENCOUNTER — Encounter: Payer: Self-pay | Admitting: Podiatrist

## 2014-08-24 ENCOUNTER — Ambulatory Visit (INDEPENDENT_AMBULATORY_CARE_PROVIDER_SITE_OTHER): Payer: BC Managed Care – PPO | Admitting: Podiatrist

## 2014-08-24 VITALS — BP 121/69 | HR 61 | Resp 16

## 2014-08-24 DIAGNOSIS — M2042 Other hammer toe(s) (acquired), left foot: Secondary | ICD-10-CM

## 2014-08-24 DIAGNOSIS — M21612 Bunion of left foot: Secondary | ICD-10-CM

## 2014-08-24 DIAGNOSIS — M2012 Hallux valgus (acquired), left foot: Secondary | ICD-10-CM

## 2014-08-24 DIAGNOSIS — M19079 Primary osteoarthritis, unspecified ankle and foot: Secondary | ICD-10-CM

## 2014-08-24 DIAGNOSIS — M1990 Unspecified osteoarthritis, unspecified site: Secondary | ICD-10-CM

## 2014-08-24 NOTE — Progress Notes (Signed)
   Chief Complaint  Patient presents with  . Foot Pain    Follow up 1st MPJ left   "I wanted to talk about surgery for this foot"  . Toe Pain    5th toe left   "I get a corn on this toe and can she do surgery on it too?"     HPI: Patient is 58 y.o. female who presents today for follow-up regarding her bunion and hammertoe on the left foot. She relates they've become increasingly painful and she is considering surgery.  No Known Allergies  Physical Exam  Patient is awake, alert, and oriented x 3.  In no acute distress.  Vascular status is intact with palpable pedal pulses at 2/4 DP and PT bilateral and capillary refill time within normal limits. Neurological sensation is also intact bilaterally via Semmes Weinstein monofilament at 5/5 sites. Light touch, vibratory sensation, Achilles tendon reflex is intact. Dermatological exam reveals skin color, turger and texture as normal. No open lesions present.  Musculature intact with dorsiflexion, plantarflexion, inversion, eversion.  Moderate to mild bunion deformity on the left foot is noted. Contracture deformity of left fifth digit is also noted with an overlying corn on the toe. She has a corn as well on the plantar aspect left hallux which is also bothersome.  Assessment: Bunion Deformity left, hallux limitus left, hammertoe left fifth  Plan:Discussed conservative versus surgical options. Recommended a bunion correction with screw fixation as well as a hammertoe repair to the fifth digit. The consent form was discussed and all three pages were signed and the patient's questions were encouraged and answered to the best of my ability. Risks of the surgery were discussed including but not limited to continued pain, infection, swelling, elevated toe, decreased range of motion,  suture or implant reaction, bleeding, decreased function, etc. Preoperative instructions were also dispensed to the patient as well as a preoperative surgical pamphlet to go  along with the instructions. Surgery will be scheduled at the patients convenience and patient will be seen at [facility] surgery center on outpatient basis.The patient is instructed to call if any questions or concerns arise.

## 2014-08-24 NOTE — Patient Instructions (Signed)
Curled Fifth Toe A curled fifth toe is an inherited condition in which your little toe curls under the toe next to it and the toenail is pointing outward. In this condition, your little toe bears weight on its side with resulting discomfort and cornsor calluses (a buildup of dead skin cells over bones that come in frequent contact with hard surfaces). These problems, if painful or troublesome, can be corrected with surgery. This is an elective surgery, (you can decide whether or not you should undergo the procedure). The surgery may be cosmetic (improve appearance), relieve pain or improve function. If surgery is recommended, your caregiver will explain your foot problem and how surgery can improve it. Your caregiver can answer your questions about the potential risks and complications of surgery. After determining that foot surgery is the best way to correct the problem, plans for the surgery can proceed. Let the caregiver know about health changes prior to surgery. It is best to do elective surgeries when your health is optimal. Be sure to ask your caregiver if there will be limits on walking, work or school. Plan accordingly. You can bear weight immediately but may need to wear a surgical shoe for a few weeks as the toe heals. The treatment of this condition is surgical and is called a derotation arthroplasty. A wedge of skin and bone is removed to uncurl ("de-rotate") the toe and have it point forward like a normal toe. LET YOUR CAREGIVER KNOW ABOUT:   Allergies to food or medicine.  Medicines taken, including vitamins, herbs, eye drops, over-the-counter medicines and creams.  Use of steroids (by mouth or creams).  Previous problems with anesthetics or numbing medicines.  A history of bleeding problems or blood clots.  Any previous surgery.  Other important health problems, including diabetes and kidney problems.  The possibility of pregnancy, if this applies. BEFORE THE PROCEDURE You  should be present 60 minutes prior to your procedure or as directed. HOME CARE INSTRUCTIONS   You can bear weight immediately, but will need to wear a bandage, a splint and sometimes, a surgical shoe for a short time following surgery.  Only take over-the-counter or prescription medicines for pain, discomfort or fever as directed by your caregiver.  Elevate your foot above the level of the heart as much as possible to limit swelling. SEEK MEDICAL CARE IF:   You have increased bleeding (more than a small spot) from the wound.  You have redness, swelling, or increasing pain in the wound.  You have pus coming from the wound.  You notice a bad smell coming from the wound or dressing. SEEK IMMEDIATE MEDICAL CARE IF:   You develop a rash.  You have difficulty breathing.  You have any allergic problems.  You have a fever. Document Released: 08/21/2000 Document Revised: 01/08/2014 Document Reviewed: 06/19/2008 [facility] Patient Information 2015 [place], [place]. This information is not intended to replace advice given to you by your health care provider. Make sure you discuss any questions you have with your health care provider.   Bunionectomy A bunionectomy is surgery to remove a bunion. A bunion is an enlargement of the joint at the base of the big toe. It is made up of bone and soft tissue on the inside part of the joint. Over time, a painful lump appears on the inside of the joint. The big toe begins to point inward toward the second toe. New bone growth can occur and a bone spur may form. The pain eventually causes difficulty  walking. A bunion usually results from inflammation caused by the irritation of poorly fitting shoes. It often begins later in life. A bunionectomy is performed when nonsurgical treatment no longer works. When surgery is needed, the extent of the procedure will depend on the degree of deformity of the foot. Your surgeon will discuss with you the different procedures  and what will work best for you depending on your age and health. LET YOUR CAREGIVER KNOW ABOUT:   Previous problems with anesthetics or medicines used to numb the skin.  Allergies to dyes, iodine, foods, and/or latex.  Medicines taken including herbs, eye drops, prescription medicines (especially medicines used to "thin the blood"), aspirin and other over-the-counter medicines, and steroids (by mouth or as a cream).  History of bleeding or blood problems.  Possibility of pregnancy, if this applies.  History of blood clots in your legs and/or lungs .  Previous surgery.  Other important health problems. RISKS AND COMPLICATIONS   Infection.  Pain.  Nerve damage.  Possibility that the bunion will recur. BEFORE THE PROCEDURE  You should be present 60 minutes prior to your procedure or as directed.  PROCEDURE  Surgery is often done so that you can go home the same day (outpatient). It may be done in a hospital or in an outpatient surgical center. An anesthetic will be used to help you sleep during the procedure. Sometimes, a spinal anesthetic is used to make you numb below the waist. A cut (incision) is made over the swollen area at the first joint of the big toe. The enlarged lump will be removed. If there is a need to reposition the bones of the big toe, this may require more than 1 incision. The bone itself may need to be cut. Screws and wires may be used in the repair. These can be removed at a later date. In severe cases, the entire joint may need to be removed and a joint replacement inserted. When done, the incision is closed with stitches (sutures). Skin adhesive strips may be added for reinforcement. They help hold the incision closed.  AFTER THE PROCEDURE  Compression bandages (dressings) are then wrapped around the wound. This helps to keep the foot in alignment and reduce swelling. Your foot will be monitored for bleeding and swelling. You will need to stay for a few hours in  the recovery area before being discharged. This allows time for the anesthesia to wear off. You will be discharged home when you are awake, stable, and doing well. HOME CARE INSTRUCTIONS   You can expect to return to normal activities within 6 to 8 weeks after surgery. The foot is at increased risk for swelling for several months. When you can expect to bear weight on the operated foot will depend on the extent of your surgery. The milder the deformity, the less tissue is removed and the sooner the return to normal activity level. During the recovery period, a special shoe, boot, or cast may be worn to accommodate the surgical bandage and to help provide stability to the foot.  Once you are home, an ice pack applied to the operative site may help with discomfort and keep swelling down. Stop using the ice if it causes discomfort.  Keep your feet raised (elevated) when possible to lessen swelling.  If you have an elastic bandage on your foot and you have numbness, tingling, or your foot becomes cold and blue, adjust the bandage to make it comfortable.  Change dressings as directed.  Keep the wound dry and clean. The wound may be washed gently with soap and water. Gently blot dry without rubbing. Do not take baths or use swimming pools or hot tubs for 10 days, or as instructed by your caregiver.  Only take over-the-counter or prescription medicines for pain, discomfort, or fever as directed by your caregiver.  You may continue a normal diet as directed.  For activity, use crutches with no weight bearing or your orthopedic shoe as directed. Continue to use crutches or a cane as directed until you can stand without causing pain. SEEK MEDICAL CARE IF:   You have redness, swelling, bruising, or increasing pain in the wound.  There is pus coming from the wound.  You have drainage from a wound lasting longer than 1 day.  You have an oral temperature above 102 F (38.9 C).  You notice a bad  smell coming from the wound or dressing.  The wound breaks open after sutures have been removed.  You develop dizzy episodes or fainting while standing.  You have persistent nausea or vomiting.  Your toes become cold.  Pain is not relieved with medicines. SEEK IMMEDIATE MEDICAL CARE IF:   You develop a rash.  You have difficulty breathing.  You develop any reaction or side effects to medicines given.  Your toes are numb or blue, or you have severe pain. MAKE SURE YOU:   Understand these instructions.  Will watch your condition.  Will get help right away if you are not doing well or get worse. Document Released: 08/07/2005 Document Revised: 11/16/2011 Document Reviewed: 09/12/2007 [facility] Patient Information 2015 [place], [place]. This information is not intended to replace advice given to you by your health care provider. Make sure you discuss any questions you have with your health care provider.

## 2014-10-23 ENCOUNTER — Ambulatory Visit (INDEPENDENT_AMBULATORY_CARE_PROVIDER_SITE_OTHER): Payer: BLUE CROSS/BLUE SHIELD | Admitting: Internal Medicine

## 2014-10-23 ENCOUNTER — Encounter: Payer: Self-pay | Admitting: Internal Medicine

## 2014-10-23 VITALS — BP 126/78 | HR 81 | Temp 98.1°F | Wt 274.0 lb

## 2014-10-23 DIAGNOSIS — Z8709 Personal history of other diseases of the respiratory system: Secondary | ICD-10-CM | POA: Diagnosis not present

## 2014-10-23 DIAGNOSIS — E669 Obesity, unspecified: Secondary | ICD-10-CM | POA: Diagnosis not present

## 2014-10-23 DIAGNOSIS — K219 Gastro-esophageal reflux disease without esophagitis: Secondary | ICD-10-CM

## 2014-10-23 DIAGNOSIS — R0789 Other chest pain: Secondary | ICD-10-CM | POA: Diagnosis not present

## 2014-10-23 DIAGNOSIS — I493 Ventricular premature depolarization: Secondary | ICD-10-CM | POA: Diagnosis not present

## 2014-10-23 MED ORDER — DEXLANSOPRAZOLE 60 MG PO CPDR: 60.0000 mg | DELAYED_RELEASE_CAPSULE | Freq: Every day | ORAL | Status: DC

## 2014-10-23 MED ORDER — FLUTICASONE-SALMETEROL 250-50 MCG/DOSE IN AEPB: 1.0000 | INHALATION_SPRAY | Freq: Two times a day (BID) | RESPIRATORY_TRACT | Status: DC

## 2014-10-23 NOTE — Progress Notes (Signed)
   Subjective:    Patient ID: Mckenzie Hebert, female    DOB: 1956-02-14, 59 y.o.   MRN: [id number]  HPI  Patient with history of GE reflux and palpitations in today complaining of some vague substernal chest pressure that she associates with reflux. Has had a lot of burping. Sometimes has water brash. Has been on Nexium without much relief. Realizes she is overweight and eats a lot. No radiation of pain into her neck or down her arm. Has seen Dr. Percival Spanish for palpitations. History of hypothyroidism. History of asthma. Says she's been wheezing more recently. Is now working first shift again.      Review of Systems     Objective:   Physical Exam  No significant chest wall tenderness. Neck is supple without JVD thyromegaly or carotid bruits. Chest clear to auscultation. Has some audible wheezing with breathing. No rales. Cardiac exam regular rate and rhythm. EKG shows occasional PVCs which she has had previously. Extremities without edema.      Assessment & Plan:  History of asthma  GE reflux  Vague chest discomfort not frankly angina. Not associated with activity.  Plan: Discontinue Nexium. Try Dexilant 60 mg daily for 8 weeks then decrease to 30 mg daily. Advair 250/50 1 spray by mouth every 12 hours. Continue pro-air is needed. If patient does not get relief with the Exelon she is to recontact me and we will refer her to GI.

## 2014-10-23 NOTE — Patient Instructions (Signed)
Try  Advair every 12 hours. Continue Albuterol inhaler . Discontinue Nexium. Try Dexilant 60 mg daily for 8 weeks. Call if not improved in 2-3 weeks or sooner if worse

## 2014-10-24 ENCOUNTER — Telehealth: Payer: Self-pay | Admitting: *Deleted

## 2014-10-24 NOTE — Telephone Encounter (Signed)
Received Prior Auth form from patient pharmacy for her [place]. Called patient insurance company [employer]) after talking to 4 different representatives and spending 1 hour on the phone trying to get this authorized , [employer] stated they will not pay for this till after 11/07/2014 because patient recently picked up script for omeprazole I explained this medication was not effective in treating patient symptoms they stated they could not authorize this medication any sooner .Notified patient pharmacy .

## 2014-12-11 ENCOUNTER — Other Ambulatory Visit: Payer: Self-pay | Admitting: Internal Medicine

## 2014-12-24 ENCOUNTER — Other Ambulatory Visit: Payer: BLUE CROSS/BLUE SHIELD | Admitting: Internal Medicine

## 2014-12-24 DIAGNOSIS — E039 Hypothyroidism, unspecified: Secondary | ICD-10-CM

## 2014-12-24 DIAGNOSIS — E785 Hyperlipidemia, unspecified: Secondary | ICD-10-CM

## 2014-12-24 DIAGNOSIS — E119 Type 2 diabetes mellitus without complications: Secondary | ICD-10-CM

## 2014-12-24 LAB — TSH: TSH: 2.802 u[IU]/mL (ref 0.350–4.500)

## 2014-12-24 LAB — LIPID PANEL
Cholesterol: 189 mg/dL (ref 0–200)
HDL: 57 mg/dL (ref >=46)
LDL Cholesterol: 117 mg/dL — ABNORMAL HIGH (ref 0–99)
Total CHOL/HDL Ratio: 3.3 Ratio
Triglycerides: 77 mg/dL (ref <150)
VLDL: 15 mg/dL (ref 0–40)

## 2014-12-24 LAB — HEMOGLOBIN A1C
Hgb A1c MFr Bld: 6.7 % — ABNORMAL HIGH (ref <5.7)
Mean Plasma Glucose: 146 mg/dL — ABNORMAL HIGH (ref <117)

## 2014-12-28 ENCOUNTER — Ambulatory Visit (INDEPENDENT_AMBULATORY_CARE_PROVIDER_SITE_OTHER): Payer: BLUE CROSS/BLUE SHIELD | Admitting: Internal Medicine

## 2014-12-28 ENCOUNTER — Ambulatory Visit: Payer: BC Managed Care – PPO | Admitting: Internal Medicine

## 2014-12-28 ENCOUNTER — Encounter: Payer: Self-pay | Admitting: Internal Medicine

## 2014-12-28 VITALS — BP 116/76 | HR 56 | Temp 98.6°F | Wt 278.5 lb

## 2014-12-28 DIAGNOSIS — E785 Hyperlipidemia, unspecified: Secondary | ICD-10-CM

## 2014-12-28 DIAGNOSIS — K219 Gastro-esophageal reflux disease without esophagitis: Secondary | ICD-10-CM

## 2014-12-28 DIAGNOSIS — F411 Generalized anxiety disorder: Secondary | ICD-10-CM

## 2014-12-28 DIAGNOSIS — E119 Type 2 diabetes mellitus without complications: Secondary | ICD-10-CM

## 2014-12-28 DIAGNOSIS — E039 Hypothyroidism, unspecified: Secondary | ICD-10-CM | POA: Diagnosis not present

## 2014-12-28 DIAGNOSIS — E669 Obesity, unspecified: Secondary | ICD-10-CM | POA: Diagnosis not present

## 2014-12-28 DIAGNOSIS — Z8709 Personal history of other diseases of the respiratory system: Secondary | ICD-10-CM

## 2014-12-28 DIAGNOSIS — G4700 Insomnia, unspecified: Secondary | ICD-10-CM

## 2014-12-28 MED ORDER — METFORMIN HCL 500 MG PO TABS: 500.0000 mg | ORAL_TABLET | Freq: Two times a day (BID) | ORAL | Status: DC

## 2014-12-28 MED ORDER — SIMVASTATIN 10 MG PO TABS: 10.0000 mg | ORAL_TABLET | Freq: Every day | ORAL | Status: DC
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
## 2014-12-28 NOTE — Patient Instructions (Signed)
Restart metformin and statin medication. Try to diet and exercise. Return in August for physical exam. Continue thyroid replacement medication.

## 2014-12-29 LAB — MICROALBUMIN / CREATININE URINE RATIO
Creatinine, Urine: 156.6 mg/dL
Microalb Creat Ratio: 2.6 mg/g (ref 0.0–30.0)
Microalb, Ur: 0.4 mg/dL (ref <2.0)

## 2015-01-07 ENCOUNTER — Ambulatory Visit (INDEPENDENT_AMBULATORY_CARE_PROVIDER_SITE_OTHER): Payer: BLUE CROSS/BLUE SHIELD | Admitting: Women's Health

## 2015-01-07 ENCOUNTER — Encounter: Payer: Self-pay | Admitting: Women's Health

## 2015-01-07 VITALS — BP 120/80 | Ht 67.0 in | Wt 276.0 lb

## 2015-01-07 DIAGNOSIS — Z01419 Encounter for gynecological examination (general) (routine) without abnormal findings: Secondary | ICD-10-CM | POA: Diagnosis not present

## 2015-01-07 DIAGNOSIS — Z13820 Encounter for screening for osteoporosis: Secondary | ICD-10-CM | POA: Diagnosis not present

## 2015-01-07 NOTE — Patient Instructions (Signed)
Sleeve Gastrectomy A sleeve gastrectomy is a surgery in which a large portion of the stomach is removed. After the surgery, the stomach will be a narrow tube about the size of a banana. This surgery is performed to help a person lose weight. The person loses weight because the reduced size of the stomach restricts the amount of food that the person can eat. The stomach will hold much less food than before the surgery. Also, the part of the stomach that is removed produces a hormone that causes hunger.  This surgery is done for people who have morbid obesity, defined as a body mass index (BMI) greater than 40. BMI is an estimate of body fat and is calculated from the height and weight of a person. This surgery may also be done for people with a BMI between 35 and 40 if they have other diseases, such as type 2 diabetes mellitus, obstructive sleep apnea, or heart and lung disorders (cardiopulmonary diseases).  LET [facility] CARE PROVIDER KNOW ABOUT:  Any allergies you have.   All medicines you are taking, including vitamins, herbs, eyedrops, creams, and over-the-counter medicines.   Use of steroids (by mouth or creams).   Previous problems you or members of your family have had with the use of anesthetics.   Any blood disorders you have.   Previous surgeries you have had.   Possibility of pregnancy, if this applies.   Other health problems you have. RISKS AND COMPLICATIONS Generally, sleeve gastrectomy is a safe procedure. However, as with any procedure, complications can occur. Possible complications include:  Infection.  Bleeding.  Blood clots.  Damage to other organs or tissue.  Leakage of fluid from the stomach into the abdominal cavity (rare). BEFORE THE PROCEDURE  You may need to have blood tests and imaging tests (such as X-rays or ultrasonography) done before the day of surgery. A test to evaluate your esophagus and how it moves (esophageal manometry) may also be  done.  You may be placed on a liquid diet 2-3 weeks before the surgery.  Ask your health care provider about changing or stopping your regular medicines.  Do not eat or drink anything for at least 8 hours before the procedure.   Make plans to have someone drive you home after your hospital stay. Also arrange for someone to help you with activities during recovery. PROCEDURE  A laparoscopic technique is usually used for this surgery:  You will be given medicine to make you sleep through the procedure (general anesthetic). This medicine will be given through an intravenous (IV) access tube that is put into one of your veins.  Once you are asleep, your abdomen will be cleaned and sterilized.  Several small incisions will be made in your abdomen.  Your abdomen will be filled with air so that it expands. This gives the surgeon more room to operate and makes your organs easier to see.  A thin, lighted tube with a tiny camera on the end (laparoscope) is put through a small incision in your abdomen. The camera on the laparoscope sends a picture to a TV screen in the operating room. This gives the surgeon a good view inside the abdomen.  Hollow tubes are put through the other small incisions in your abdomen. The tools needed for the procedure are put through these tubes.  The surgeon uses staples to divide part of the stomach and then removes it through one of the incisions.  The remaining stomach may be reinforced using stitches  or surgical glue or both to prevent leakage of the stomach contents. A small tube (drain) may be placed through one of the incisions to allow extra fluid to flow from the area.  The incisions are closed with stitches, staples, or glue. AFTER THE PROCEDURE  You will be monitored closely in a recovery area. Once the anesthetic has worn off, you will likely be moved to a regular hospital room.  You will be given medicine for pain and nausea.   You may have a drain  from one of the incisions in your abdomen. If a drain is used, it may stay in place after you go home from the hospital and be removed at a follow-up appointment.   You will be encouraged to walk around several times a day. This helps prevent blood clots.  You will be started on a liquid diet the first day after your surgery. Sometimes a test is done to check for leaking before you can eat.  You will be urged to cough and do deep breathing exercises. This helps prevent a lung infection after a surgery.  You will likely need to stay in the hospital for a few days.  Document Released: 06/21/2009 Document Revised: 04/26/2013 Document Reviewed: 01/06/2013 [facility] Patient Information 2015 [place], [place]. This information is not intended to replace advice given to you by your health care provider. Make sure you discuss any questions you have with your health care provider. Health Recommendations for Postmenopausal Women Respected and ongoing research has looked at the most common causes of death, disability, and poor quality of life in postmenopausal women. The causes include heart disease, diseases of blood vessels, diabetes, depression, cancer, and bone loss (osteoporosis). Many things can be done to help lower the chances of developing these and other common problems. CARDIOVASCULAR DISEASE Heart Disease: A heart attack is a medical emergency. Know the signs and symptoms of a heart attack. Below are things women can do to reduce their risk for heart disease.   Do not smoke. If you smoke, quit.  Aim for a healthy weight. Being overweight causes many preventable deaths. Eat a healthy and balanced diet and drink an adequate amount of liquids.  Get moving. Make a commitment to be more physically active. Aim for 30 minutes of activity on most, if not all days of the week.  Eat for heart health. Choose a diet that is low in saturated fat and cholesterol and eliminate trans fat. Include whole grains,  vegetables, and fruits. Read and understand the labels on food containers before buying.  Know your numbers. Ask your caregiver to check your blood pressure, cholesterol (total, HDL, LDL, triglycerides) and blood glucose. Work with your caregiver on improving your entire clinical picture.  High blood pressure. Limit or stop your table salt intake (try salt substitute and food seasonings). Avoid salty foods and drinks. Read labels on food containers before buying. Eating well and exercising can help control high blood pressure. STROKE  Stroke is a medical emergency. Stroke may be the result of a blood clot in a blood vessel in the brain or by a brain hemorrhage (bleeding). Know the signs and symptoms of a stroke. To lower the risk of developing a stroke:  Avoid fatty foods.  Quit smoking.  Control your diabetes, blood pressure, and irregular heart rate. THROMBOPHLEBITIS (BLOOD CLOT) OF THE LEG  Becoming overweight and leading a stationary lifestyle may also contribute to developing blood clots. Controlling your diet and exercising will help lower the risk  of developing blood clots. CANCER SCREENING  Breast Cancer: Take steps to reduce your risk of breast cancer.  You should practice "breast self-awareness." This means understanding the normal appearance and feel of your breasts and should include breast self-examination. Any changes detected, no matter how small, should be reported to your caregiver.  After age 55, you should have a clinical breast exam (CBE) every year.  Starting at age 64, you should consider having a mammogram (breast X-ray) every year.  If you have a family history of breast cancer, talk to your caregiver about genetic screening.  If you are at high risk for breast cancer, talk to your caregiver about having an MRI and a mammogram every year.  Intestinal or Stomach Cancer: Tests to consider are a rectal exam, fecal occult blood, sigmoidoscopy, and colonoscopy. Women  who are high risk may need to be screened at an earlier age and more often.  Cervical Cancer:  Beginning at age 34, you should have a Pap test every 3 years as long as the past 3 Pap tests have been normal.  If you have had past treatment for cervical cancer or a condition that could lead to cancer, you need Pap tests and screening for cancer for at least 20 years after your treatment.  If you had a hysterectomy for a problem that was not cancer or a condition that could lead to cancer, then you no longer need Pap tests.  If you are between ages 81 and 59, and you have had normal Pap tests going back 10 years, you no longer need Pap tests.  If Pap tests have been discontinued, risk factors (such as a new sexual partner) need to be reassessed to determine if screening should be resumed.  Some medical problems can increase the chance of getting cervical cancer. In these cases, your caregiver may recommend more frequent screening and Pap tests.  Uterine Cancer: If you have vaginal bleeding after reaching menopause, you should notify your caregiver.  Ovarian Cancer: Other than yearly pelvic exams, there are no reliable tests available to screen for ovarian cancer at this time except for yearly pelvic exams.  Lung Cancer: Yearly chest X-rays can detect lung cancer and should be done on high risk women, such as cigarette smokers and women with chronic lung disease (emphysema).  Skin Cancer: A complete body skin exam should be done at your yearly examination. Avoid overexposure to the sun and ultraviolet light lamps. Use a strong sun block cream when in the sun. All of these things are important for lowering the risk of skin cancer. MENOPAUSE Menopause Symptoms: Hormone therapy products are effective for treating symptoms associated with menopause:  Moderate to severe hot flashes.  Night sweats.  Mood swings.  Headaches.  Tiredness.  Loss of sex drive.  Insomnia.  Other  symptoms. Hormone replacement carries certain risks, especially in older women. Women who use or are thinking about using estrogen or estrogen with progestin treatments should discuss that with their caregiver. Your caregiver will help you understand the benefits and risks. The ideal dose of hormone replacement therapy is not known. The Food and Drug Administration (FDA) has concluded that hormone therapy should be used only at the lowest doses and for the shortest amount of time to reach treatment goals.  OSTEOPOROSIS Protecting Against Bone Loss and Preventing Fracture If you use hormone therapy for prevention of bone loss (osteoporosis), the risks for bone loss must outweigh the risk of the therapy. Ask your caregiver about  other medications known to be safe and effective for preventing bone loss and fractures. To guard against bone loss or fractures, the following is recommended:  If you are younger than age 35, take 1000 mg of calcium and at least 600 mg of Vitamin D per day.  If you are older than age 81 but younger than age 80, take 1200 mg of calcium and at least 600 mg of Vitamin D per day.  If you are older than age 63, take 1200 mg of calcium and at least 800 mg of Vitamin D per day. Smoking and excessive alcohol intake increases the risk of osteoporosis. Eat foods rich in calcium and vitamin D and do weight bearing exercises several times a week as your caregiver suggests. DIABETES Diabetes Mellitus: If you have type I or type 2 diabetes, you should keep your blood sugar under control with diet, exercise, and recommended medication. Avoid starchy and fatty foods, and too many sweets. Being overweight can make diabetes control more difficult. COGNITION AND MEMORY Cognition and Memory: Menopausal hormone therapy is not recommended for the prevention of cognitive disorders such as Alzheimer's disease or memory loss.  DEPRESSION  Depression may occur at any age, but it is common in elderly  women. This may be because of physical, medical, social (loneliness), or financial problems and needs. If you are experiencing depression because of medical problems and control of symptoms, talk to your caregiver about this. Physical activity and exercise may help with mood and sleep. Community and volunteer involvement may improve your sense of value and worth. If you have depression and you feel that the problem is getting worse or becoming severe, talk to your caregiver about which treatment options are best for you. ACCIDENTS  Accidents are common and can be serious in elderly woman. Prepare your house to prevent accidents. Eliminate throw rugs, place hand bars in bath, shower, and toilet areas. Avoid wearing high heeled shoes or walking on wet, snowy, and icy areas. Limit or stop driving if you have vision or hearing problems, or if you feel you are unsteady with your movements and reflexes. HEPATITIS C Hepatitis C is a type of viral infection affecting the liver. It is spread mainly through contact with blood from an infected person. It can be treated, but if left untreated, it can lead to severe liver damage over the years. Many people who are infected do not know that the virus is in their blood. If you are a "baby-boomer", it is recommended that you have one screening test for Hepatitis C. IMMUNIZATIONS  Several immunizations are important to consider having during your senior years, including:   Tetanus, diphtheria, and pertussis booster shot.  Influenza every year before the flu season begins.  Pneumonia vaccine.  Shingles vaccine.  Others, as indicated based on your specific needs. Talk to your caregiver about these. Document Released: 10/16/2005 Document Revised: 01/08/2014 Document Reviewed: 06/11/2008 [facility] Patient Information 2015 [place], [place]. This information is not intended to replace advice given to you by your health care provider. Make sure you discuss any questions  you have with your health care provider. Exercise to Stay Healthy Exercise helps you become and stay healthy. EXERCISE IDEAS AND TIPS Choose exercises that:  You enjoy.  Fit into your day. You do not need to exercise really hard to be healthy. You can do exercises at a slow or medium level and stay healthy. You can:  Stretch before and after working out.  Try yoga, Pilates, or  tai chi.  Lift weights.  Walk fast, swim, jog, run, climb stairs, bicycle, dance, or rollerskate.  Take aerobic classes. Exercises that burn about 150 calories:  Running 1  miles in 15 minutes.  Playing volleyball for 45 to 60 minutes.  Washing and waxing a car for 45 to 60 minutes.  Playing touch football for 45 minutes.  Walking 1  miles in 35 minutes.  Pushing a stroller 1  miles in 30 minutes.  Playing basketball for 30 minutes.  Raking leaves for 30 minutes.  Bicycling 5 miles in 30 minutes.  Walking 2 miles in 30 minutes.  Dancing for 30 minutes.  Shoveling snow for 15 minutes.  Swimming laps for 20 minutes.  Walking up stairs for 15 minutes.  Bicycling 4 miles in 15 minutes.  Gardening for 30 to 45 minutes.  Jumping rope for 15 minutes.  Washing windows or floors for 45 to 60 minutes. Document Released: 09/26/2010 Document Revised: 11/16/2011 Document Reviewed: 09/26/2010 [facility] Patient Information 2015 [place], [place]. This information is not intended to replace advice given to you by your health care provider. Make sure you discuss any questions you have with your health care provider. Diabetes Mellitus and Food It is important for you to manage your blood sugar (glucose) level. Your blood glucose level can be greatly affected by what you eat. Eating healthier foods in the appropriate amounts throughout the day at about the same time each day will help you control your blood glucose level. It can also help slow or prevent worsening of your diabetes mellitus. Healthy  eating may even help you improve the level of your blood pressure and reach or maintain a healthy weight.  HOW CAN FOOD AFFECT ME? Carbohydrates Carbohydrates affect your blood glucose level more than any other type of food. Your dietitian will help you determine how many carbohydrates to eat at each meal and teach you how to count carbohydrates. Counting carbohydrates is important to keep your blood glucose at a healthy level, especially if you are using insulin or taking certain medicines for diabetes mellitus. Alcohol Alcohol can cause sudden decreases in blood glucose (hypoglycemia), especially if you use insulin or take certain medicines for diabetes mellitus. Hypoglycemia can be a life-threatening condition. Symptoms of hypoglycemia (sleepiness, dizziness, and disorientation) are similar to symptoms of having too much alcohol.  If your health care provider has given you approval to drink alcohol, do so in moderation and use the following guidelines:  Women should not have more than one drink per day, and men should not have more than two drinks per day. One drink is equal to:  12 oz of beer.  5 oz of wine.  1 oz of hard liquor.  Do not drink on an empty stomach.  Keep yourself hydrated. Have water, diet soda, or unsweetened iced tea.  Regular soda, juice, and other mixers might contain a lot of carbohydrates and should be counted. WHAT FOODS ARE NOT RECOMMENDED? As you make food choices, it is important to remember that all foods are not the same. Some foods have fewer nutrients per serving than other foods, even though they might have the same number of calories or carbohydrates. It is difficult to get your body what it needs when you eat foods with fewer nutrients. Examples of foods that you should avoid that are high in calories and carbohydrates but low in nutrients include:  Trans fats (most processed foods list trans fats on the Nutrition Facts label).  Regular  soda.  Juice.  Candy.  Sweets, such as cake, pie, doughnuts, and cookies.  Fried foods. WHAT FOODS CAN I EAT? Have nutrient-rich foods, which will nourish your body and keep you healthy. The food you should eat also will depend on several factors, including:  The calories you need.  The medicines you take.  Your weight.  Your blood glucose level.  Your blood pressure level.  Your cholesterol level. You also should eat a variety of foods, including:  Protein, such as meat, poultry, fish, tofu, nuts, and seeds (lean animal proteins are best).  Fruits.  Vegetables.  Dairy products, such as milk, cheese, and yogurt (low fat is best).  Breads, grains, pasta, cereal, rice, and beans.  Fats such as olive oil, trans fat-free margarine, canola oil, avocado, and olives. DOES EVERYONE WITH DIABETES MELLITUS HAVE THE SAME MEAL PLAN? Because every person with diabetes mellitus is different, there is not one meal plan that works for everyone. It is very important that you meet with a dietitian who will help you create a meal plan that is just right for you. Document Released: 05/21/2005 Document Revised: 08/29/2013 Document Reviewed: 07/21/2013 [facility] Patient Information 2015 [place], [place]. This information is not intended to replace advice given to you by your health care provider. Make sure you discuss any questions you have with your health care provider.

## 2015-01-07 NOTE — Progress Notes (Signed)
Mckenzie Hebert 23-Jul-1956 [id number]    History:    Presents for annual exam.  Postmenopausal/no HRT/no bleeding. Normal Pap and mammogram history. Hypothyroid/type 2 diabetes managed by primary care, recently started on metformin and is not happy about.12-16-2005 benign colon polyp recommended 10 year repeat colonoscopy. 12-16-2008 normal DEXA. 2012-12-16 was having numerous headaches had a normal brain MRI. Continues to struggle with depression since death of her husband in 12-17-06.  Past medical history, past surgical history, family history and social history were all reviewed and documented in the EPIC chart. Works at [employer]. Mother diabetes, hypertension, stroke.  ROS:  A ROS was performed and pertinent positives and negatives are included.  Exam:  Filed Vitals:   01/07/15 1447  BP: 120/80    General appearance:  Normal Thyroid:  Symmetrical, normal in size, without palpable masses or nodularity. Respiratory  Auscultation:  Clear without wheezing or rhonchi Cardiovascular  Auscultation:  Regular rate, without rubs, murmurs or gallops  Edema/varicosities:  Not grossly evident Abdominal  Soft,nontender, without masses, guarding or rebound.  Liver/spleen:  No organomegaly noted  Hernia:  None appreciated  Skin  Inspection:  Grossly normal   Breasts: Examined lying and sitting.     Right: Without masses, retractions, discharge or axillary adenopathy.     Left: Without masses, retractions, discharge or axillary adenopathy. Gentitourinary   Inguinal/mons:  Normal without inguinal adenopathy  External genitalia:  Normal  BUS/Urethra/Skene's glands:  Normal  Vagina:  Normal  Cervix:  Normal  Uterus:   normal in size, shape and contour.  Midline and mobile  Adnexa/parametria:     Rt: Without masses or tenderness.   Lt: Without masses or tenderness.  Anus and perineum: Normal  Digital rectal exam: Normal sphincter tone without palpated masses or tenderness  Assessment/Plan:  59  y.o. WWF G1P0 for annual exam.    Postmenopausal/no HRT/no bleeding Hypothyroidism/diabetes-primary care manages labs and meds Obesity Grief/depression-husband died 2006/12/17  Plan: Strongly encouraged to return to counseling has had in the past. SBE's, continue annual 3-D tomography history of dense breasts. Increase regular exercise and decrease calories for weight loss, weight loss surgery reviewed will think about. Encouraged nutritionist to help with diet for diabetes. UA, Pap normal 12-16-2013, new screening guidelines reviewed. Condoms encouraged if sexually active. Zostavax at 60 recommended.    Huel Cote [facility] [place], 3:49 PM 01/07/2015

## 2015-01-08 LAB — URINALYSIS W MICROSCOPIC + REFLEX CULTURE
Bacteria, UA: NONE SEEN
Bilirubin Urine: NEGATIVE
Casts: NONE SEEN
Crystals: NONE SEEN
Glucose, UA: NEGATIVE mg/dL
Hgb urine dipstick: NEGATIVE
Ketones, ur: NEGATIVE mg/dL
Leukocytes, UA: NEGATIVE
Nitrite: NEGATIVE
Protein, ur: NEGATIVE mg/dL
Specific Gravity, Urine: 1.017 (ref 1.005–1.030)
Squamous Epithelial / LPF: NONE SEEN
Urobilinogen, UA: 1 mg/dL (ref 0.0–1.0)
pH: 6.5 (ref 5.0–8.0)

## 2015-01-10 ENCOUNTER — Other Ambulatory Visit: Payer: Self-pay | Admitting: Gynecology

## 2015-01-10 DIAGNOSIS — Z13820 Encounter for screening for osteoporosis: Secondary | ICD-10-CM

## 2015-01-24 ENCOUNTER — Ambulatory Visit (INDEPENDENT_AMBULATORY_CARE_PROVIDER_SITE_OTHER): Payer: BLUE CROSS/BLUE SHIELD

## 2015-01-24 DIAGNOSIS — Z13820 Encounter for screening for osteoporosis: Secondary | ICD-10-CM | POA: Diagnosis not present

## 2015-02-05 ENCOUNTER — Other Ambulatory Visit: Payer: Self-pay | Admitting: Internal Medicine

## 2015-02-05 NOTE — Telephone Encounter (Signed)
Refill x 3 months 

## 2015-02-28 ENCOUNTER — Other Ambulatory Visit: Payer: Self-pay | Admitting: Internal Medicine

## 2015-03-18 ENCOUNTER — Telehealth: Payer: Self-pay | Admitting: Internal Medicine

## 2015-03-18 NOTE — Telephone Encounter (Signed)
Patient called regarding concerns about hot flashes. She saw GYN early May for physical exam. She should contact them with her concerns. She has an appointment here for physical examination August.

## 2015-03-25 ENCOUNTER — Encounter: Payer: Self-pay | Admitting: Women's Health

## 2015-03-25 ENCOUNTER — Ambulatory Visit (INDEPENDENT_AMBULATORY_CARE_PROVIDER_SITE_OTHER): Payer: BLUE CROSS/BLUE SHIELD | Admitting: Women's Health

## 2015-03-25 DIAGNOSIS — N951 Menopausal and female climacteric states: Secondary | ICD-10-CM

## 2015-03-25 DIAGNOSIS — R232 Flushing: Secondary | ICD-10-CM

## 2015-03-25 DIAGNOSIS — E1310 Other specified diabetes mellitus with ketoacidosis without coma: Secondary | ICD-10-CM

## 2015-03-25 DIAGNOSIS — E1110 Type 2 diabetes mellitus with ketoacidosis without coma: Secondary | ICD-10-CM

## 2015-03-25 LAB — HEMOGLOBIN A1C
Hgb A1c MFr Bld: 6.2 % — ABNORMAL HIGH (ref <5.7)
Mean Plasma Glucose: 131 mg/dL — ABNORMAL HIGH (ref <117)

## 2015-03-25 MED ORDER — VENLAFAXINE HCL ER 75 MG PO CP24: 75.0000 mg | ORAL_CAPSULE | Freq: Every day | ORAL | Status: DC

## 2015-03-25 NOTE — Patient Instructions (Signed)
Depression Depression refers to feeling sad, low, down in the dumps, blue, gloomy, or empty. In general, there are two kinds of depression: 1. Normal sadness or normal grief. This kind of depression is one that we all feel from time to time after upsetting life experiences, such as the loss of a job or the ending of a relationship. This kind of depression is considered normal, is short lived, and resolves within a few days to 2 weeks. Depression experienced after the loss of a loved one (bereavement) often lasts longer than 2 weeks but normally gets better with time. 2. Clinical depression. This kind of depression lasts longer than normal sadness or normal grief or interferes with your ability to function at home, at work, and in school. It also interferes with your personal relationships. It affects almost every aspect of your life. Clinical depression is an illness. Symptoms of depression can also be caused by conditions other than those mentioned above, such as:  Physical illness. Some physical illnesses, including underactive thyroid gland (hypothyroidism), severe anemia, specific types of cancer, diabetes, uncontrolled seizures, heart and lung problems, strokes, and chronic pain are commonly associated with symptoms of depression.  Side effects of some prescription medicine. In some people, certain types of medicine can cause symptoms of depression.  Substance abuse. Abuse of alcohol and illicit drugs can cause symptoms of depression. SYMPTOMS Symptoms of normal sadness and normal grief include the following:  Feeling sad or crying for short periods of time.  Not caring about anything (apathy).  Difficulty sleeping or sleeping too much.  No longer able to enjoy the things you used to enjoy.  Desire to be by oneself all the time (social isolation).  Lack of energy or motivation.  Difficulty concentrating or remembering.  Change in appetite or weight.  Restlessness or  agitation. Symptoms of clinical depression include the same symptoms of normal sadness or normal grief and also the following symptoms:  Feeling sad or crying all the time.  Feelings of guilt or worthlessness.  Feelings of hopelessness or helplessness.  Thoughts of suicide or the desire to harm yourself (suicidal ideation).  Loss of touch with reality (psychotic symptoms). Seeing or hearing things that are not real (hallucinations) or having false beliefs about your life or the people around you (delusions and paranoia). DIAGNOSIS  The diagnosis of clinical depression is usually based on how bad the symptoms are and how long they have lasted. Your health care provider will also ask you questions about your medical history and substance use to find out if physical illness, use of prescription medicine, or substance abuse is causing your depression. Your health care provider may also order blood tests. TREATMENT  Often, normal sadness and normal grief do not require treatment. However, sometimes antidepressant medicine is given for bereavement to ease the depressive symptoms until they resolve. The treatment for clinical depression depends on how bad the symptoms are but often includes antidepressant medicine, counseling with a mental health professional, or both. Your health care provider will help to determine what treatment is best for you. Depression caused by physical illness usually goes away with appropriate medical treatment of the illness. If prescription medicine is causing depression, talk with your health care provider about stopping the medicine, decreasing the dose, or changing to another medicine. Depression caused by the abuse of alcohol or illicit drugs goes away when you stop using these substances. Some adults need professional help in order to stop drinking or using drugs. SEEK IMMEDIATE MEDICAL   CARE IF:  You have thoughts about hurting yourself or others.  You lose touch  with reality (have psychotic symptoms).  You are taking medicine for depression and have a serious side effect. FOR MORE INFORMATION  National Alliance on Mental Illness: www.nami.[employer]: https://carter.com/ Document Released: 08/21/2000 Document Revised: 01/08/2014 Document Reviewed: 11/23/2011 [facility] Patient Information 2015 [place], [place]. This information is not intended to replace advice given to you by your health care provider. Make sure you discuss any questions you have with your health care provider. Insomnia Insomnia is frequent trouble falling and/or staying asleep. Insomnia can be a long term problem or a short term problem. Both are common. Insomnia can be a short term problem when the wakefulness is related to a certain stress or worry. Long term insomnia is often related to ongoing stress during waking hours and/or poor sleeping habits. Overtime, sleep deprivation itself can make the problem worse. Every little thing feels more severe because you are overtired and your ability to cope is decreased. CAUSES  3. Stress, anxiety, and depression. 4. Poor sleeping habits. 5. Distractions such as TV in the bedroom. 6. Naps close to bedtime. 7. Engaging in emotionally charged conversations before bed. 8. Technical reading before sleep. 9. Alcohol and other sedatives. They may make the problem worse. They can hurt normal sleep patterns and normal dream activity. 10. Stimulants such as caffeine for several hours prior to bedtime. 11. Pain syndromes and shortness of breath can cause insomnia. 12. Exercise late at night. 13. Changing time zones may cause sleeping problems (jet lag). It is sometimes helpful to have someone observe your sleeping patterns. They should look for periods of not breathing during the night (sleep apnea). They should also look to see how long those periods last. If you live alone or observers are uncertain, you can also be  observed at a sleep clinic where your sleep patterns will be professionally monitored. Sleep apnea requires a checkup and treatment. Give your caregivers your medical history. Give your caregivers observations your family has made about your sleep.  SYMPTOMS   Not feeling rested in the morning.  Anxiety and restlessness at bedtime.  Difficulty falling and staying asleep. TREATMENT   Your caregiver may prescribe treatment for an underlying medical disorders. Your caregiver can give advice or help if you are using alcohol or other drugs for self-medication. Treatment of underlying problems will usually eliminate insomnia problems.  Medications can be prescribed for short time use. They are generally not recommended for lengthy use.  Over-the-counter sleep medicines are not recommended for lengthy use. They can be habit forming.  You can promote easier sleeping by making lifestyle changes such as:  Using relaxation techniques that help with breathing and reduce muscle tension.  Exercising earlier in the day.  Changing your diet and the time of your last meal. No night time snacks.  Establish a regular time to go to bed.  Counseling can help with stressful problems and worry.  Soothing music and white noise may be helpful if there are background noises you cannot remove.  Stop tedious detailed work at least one hour before bedtime. HOME CARE INSTRUCTIONS   Keep a diary. Inform your caregiver about your progress. This includes any medication side effects. See your caregiver regularly. Take note of:  Times when you are asleep.  Times when you are awake during the night.  The quality of your sleep.  How you feel the next day. This information will  help your caregiver care for you.  Get out of bed if you are still awake after 15 minutes. Read or do some quiet activity. Keep the lights down. Wait until you feel sleepy and go back to bed.  Keep regular sleeping and waking hours.  Avoid naps.  Exercise regularly.  Avoid distractions at bedtime. Distractions include watching television or engaging in any intense or detailed activity like attempting to balance the household checkbook.  Develop a bedtime ritual. Keep a familiar routine of bathing, brushing your teeth, climbing into bed at the same time each night, listening to soothing music. Routines increase the success of falling to sleep faster.  Use relaxation techniques. This can be using breathing and muscle tension release routines. It can also include visualizing peaceful scenes. You can also help control troubling or intruding thoughts by keeping your mind occupied with boring or repetitive thoughts like the old concept of counting sheep. You can make it more creative like imagining planting one beautiful flower after another in your backyard garden.  During your day, work to eliminate stress. When this is not possible use some of the previous suggestions to help reduce the anxiety that accompanies stressful situations. MAKE SURE YOU:   Understand these instructions.  Will watch your condition.  Will get help right away if you are not doing well or get worse. Document Released: 08/21/2000 Document Revised: 11/16/2011 Document Reviewed: 09/21/2007 [facility] Patient Information 2015 [place], [place]. This information is not intended to replace advice given to you by your health care provider. Make sure you discuss any questions you have with your health care provider.

## 2015-03-25 NOTE — Progress Notes (Signed)
Patient ID: Mckenzie Hebert, female   DOB: 10/05/1955, 59 y.o.   MRN: [id number] Presents with several issues. States has felt more depressed and down since being placed on metformin in April, has had at least a 10 pound weight loss with diet and exercise since being started on metformin.  Also having increased hot flushes, poor sleep. Postmenopausal-8 years no HRT in the past. States has struggled with depression since husband dying in 2008, dog has cancer., has not seen a counselor but states is now willing to schedule. Denies feelings of harming self. Requests repeat hemoglobin A1c, had been 6.7  3 months ago, primary care managing.  Exam: Affect flat.   Depression Hot flashes/poor sleep Type 2 diabetes  Plan: Reviewed importance of scheduling counseling, Sonda Primes name and number was given. Options reviewed reviewed best not to use HRT since 8 years past menopause, Effexor 75 mg one tablet daily increase to 2 tablets, reviewed may help with hot flushes/sleep as well as depression. Reviewed best option is medication along with counseling. Hemoglobin A1c pending.

## 2015-03-26 ENCOUNTER — Telehealth: Payer: Self-pay | Admitting: Internal Medicine

## 2015-03-26 ENCOUNTER — Encounter: Payer: Self-pay | Admitting: Women's Health

## 2015-03-26 DIAGNOSIS — E119 Type 2 diabetes mellitus without complications: Secondary | ICD-10-CM

## 2015-03-26 NOTE — Telephone Encounter (Signed)
°  Natural fruits for sweetening.  She saw her GYN and they drew her A1C and it was down to 6.2 from 6.9.  She is having a lot of fatigue in the evenings when she gets home from work.  She is also feeling shaky around the late afternoons as well.  She does some nuts and weight watchers bars for snacking and diet soda's.  Says her glucose readings are usually around 90 at this time of the evening.  She is still taking the Metformin bid, but wonders if think maybe her dosage should be cut in 1/2 since she is feeling so tired and shaky.  She's scheduled for her CPE in late August with you.  Do you want to make any changed prior to that?    [phone]

## 2015-03-27 NOTE — Telephone Encounter (Signed)
No  Change. Needs to make sure she is getting enough calories and protein. Does she want to go to [facility]?

## 2015-03-28 NOTE — Telephone Encounter (Signed)
Left message for patient to call office regarding her feeling shaky.  Per Dr Renold Genta, she needs to make sure she is eating enough calories and protein.  Would patient like to be referred to Diabetes treatment center?

## 2015-03-28 NOTE — Telephone Encounter (Signed)
Patient informed to continue medication and increase calorie and protien intake.  Referral to diabetes management faxed per patient request.

## 2015-04-02 ENCOUNTER — Other Ambulatory Visit: Payer: Self-pay

## 2015-04-02 DIAGNOSIS — Z1231 Encounter for screening mammogram for malignant neoplasm of breast: Secondary | ICD-10-CM

## 2015-04-09 ENCOUNTER — Other Ambulatory Visit: Payer: Self-pay | Admitting: Internal Medicine

## 2015-04-15 ENCOUNTER — Ambulatory Visit: Payer: BLUE CROSS/BLUE SHIELD | Admitting: Podiatry

## 2015-04-19 ENCOUNTER — Other Ambulatory Visit: Payer: Self-pay | Admitting: Internal Medicine

## 2015-04-20 ENCOUNTER — Other Ambulatory Visit: Payer: Self-pay | Admitting: Internal Medicine

## 2015-04-22 ENCOUNTER — Ambulatory Visit (INDEPENDENT_AMBULATORY_CARE_PROVIDER_SITE_OTHER): Payer: BLUE CROSS/BLUE SHIELD | Admitting: Podiatry

## 2015-04-22 ENCOUNTER — Encounter: Payer: Self-pay | Admitting: Podiatry

## 2015-04-22 VITALS — BP 126/72 | HR 75 | Resp 18

## 2015-04-22 DIAGNOSIS — R52 Pain, unspecified: Secondary | ICD-10-CM

## 2015-04-22 DIAGNOSIS — M2042 Other hammer toe(s) (acquired), left foot: Secondary | ICD-10-CM | POA: Diagnosis not present

## 2015-04-22 NOTE — Patient Instructions (Signed)
Pre-Operative Instructions  Congratulations, you have decided to take an important step to improving your quality of life.  You can be assured that the doctors of [facility] will be with you every step of the way.  1. Plan to be at the surgery center/hospital at least 1 (one) hour prior to your scheduled time unless otherwise directed by the surgical center/hospital staff.  You must have a responsible adult accompany you, remain during the surgery and drive you home.  Make sure you have directions to the surgical center/hospital and know how to get there on time. 2. For hospital based surgery you will need to obtain a history and physical form from your family physician within 1 month prior to the date of surgery- we will give you a form for you primary physician.  3. We make every effort to accommodate the date you request for surgery.  There are however, times where surgery dates or times have to be moved.  We will contact you as soon as possible if a change in schedule is required.   4. No Aspirin/Ibuprofen for one week before surgery.  If you are on aspirin, any non-steroidal anti-inflammatory medications (Mobic, Aleve, Ibuprofen) you should stop taking it 7 days prior to your surgery.  You make take Tylenol  For pain prior to surgery.  5. Medications- If you are taking daily heart and blood pressure medications, seizure, reflux, allergy, asthma, anxiety, pain or diabetes medications, make sure the surgery center/hospital is aware before the day of surgery so they may notify you which medications to take or avoid the day of surgery. 6. No food or drink after midnight the night before surgery unless directed otherwise by surgical center/hospital staff. 7. No alcoholic beverages 24 hours prior to surgery.  No smoking 24 hours prior to or 24 hours after surgery. 8. Wear loose pants or shorts- loose enough to fit over bandages, boots, and casts. 9. No slip on shoes, sneakers are best. 10. Bring  your boot with you to the surgery center/hospital.  Also bring crutches or a walker if your physician has prescribed it for you.  If you do not have this equipment, it will be provided for you after surgery. 11. If you have not been contracted by the surgery center/hospital by the day before your surgery, call to confirm the date and time of your surgery. 12. Leave-time from work may vary depending on the type of surgery you have.  Appropriate arrangements should be made prior to surgery with your employer. 13. Prescriptions will be provided immediately following surgery by your doctor.  Have these filled as soon as possible after surgery and take the medication as directed. 14. Remove nail polish on the operative foot. 15. Wash the night before surgery.  The night before surgery wash the foot and leg well with the antibacterial soap provided and water paying special attention to beneath the toenails and in between the toes.  Rinse thoroughly with water and dry well with a towel.  Perform this wash unless told not to do so by your physician.  Enclosed: 1 Ice pack (please put in freezer the night before surgery)   1 Hibiclens skin cleaner   Pre-op Instructions  If you have any questions regarding the instructions, do not hesitate to call our office.  [place]: [address] [place], [place] [address] [phone]  [place]: [address] [place], [place] [address] [phone]  [place]: [address]  [place], [place] [address] [phone]  Dr. Richard   Tuchman DPM, Dr. Norman Regal DPM Dr. Richard Sikora DPM, Dr. M. Todd Hyatt DPM, Dr. Kathryn Egerton DPM, Dr. Matthew Wagoner DPM 

## 2015-04-22 NOTE — Progress Notes (Signed)
Patient ID: Mckenzie Hebert, female   DOB: 1955-10-21, 59 y.o.   MRN: [id number]  Subjective: 59 year old female presented also with concerns of left fifth toe pain. She states that she continues have pain to her fifth toe despite shoe gear changes and padding. She consistent gets a corn overlying the area which becomes very painful. Because of this she would like to pursue surgical intervention to help decrease her pain. She also states that she has a bunion of the soft tissue mass lumbar foot although they are not as painful. No other complaints at this time in no acute changes his last appointment.  Objective: AAO 3, NAD DP/PT pulses palpable, CRT less than 3 seconds Protective sensation intact with Simms Weinstein monofilament, vibratory sensation intact, Achilles tendon reflex intact. 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 There is tenderness to palpation overlying the area. There is no overlying erythema, increase in warmth. The underlying skin is intact after debridement. 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 There is no pain or crepitation with first MTPJ. There is a small firm soft tissue mass of the medial band of the plantar fascial in the arch of the foot consistent with a plantar fibroma. There is no tenderness along the area and there is no overlying skin change. No other areas of tenderness to bilateral lower x-rays. No open lesions or pre-ulcerative lesions elsewhere. No pain with calf compression, swelling, warmth, erythema.  Assessment: 59 year old female with symptomatic left fifth toe deformity  Plan: -Treatment options discussed including all alternatives, risks, and complications -X-rays were reviewed with the patient. -Discussed both conservative and surgical treatment options for the left  fifth toe. At this time she is attended multiple conservative treatments without any resolution. At this time she is requesting surgical intervention to help decrease her pain and deformity. I discussed with her left fifth digit PIPJ arthroplasty with derotational skinplasty. She like to hold off on surgery for the bunion as well as the plantar fibroma at this time. -The incision placement as well as the postoperative course was discussed with the patient. I discussed risks of the surgery which include, but not limited to, infection, bleeding, pain, swelling, need for further surgery, delayed or nonhealing, painful or ugly scar, numbness or sensation changes, over/under correction, recurrence, transfer lesions, further deformity, hardware failure, DVT/PE, loss of toe/foot. Patient understands these risks and wishes to proceed with surgery. The surgical consent was reviewed with the patient all 3 pages were signed. No promises or guarantees were given to the outcome of the procedure. All questions were answered to the best of my ability. Before the surgery the patient was encouraged to call the office if there is any further questions. The surgery will be performed at the [facility] on an outpatient basis.  Celesta Gentile, DPM

## 2015-04-25 ENCOUNTER — Telehealth: Payer: Self-pay | Admitting: *Deleted

## 2015-04-30 NOTE — Telephone Encounter (Signed)
"  Dr. Jacqualyn Posey was discussing surgery on my left foot.  I wasn't sure when I could get off or if I could get off anytime soon.  I will not be able to get off until the first of the year, in January.  Call back or whatever I'll be glad to do that.  Thank you very much, have a blessed day."  I left patient messages that I was returning her call.  Please give me a call back.

## 2015-05-01 NOTE — Telephone Encounter (Signed)
I attempted to return her call on her home number.  I left a message to return my call.  I then reached her on her mobile.  I'm returning your call regarding scheduling surgery.  "Yes, you called me yesterday.  I'm going to have to hold off for now due to my job.  I won't be able to do it until the first of January.  When should I call back to get that scheduled?"  You can call back the end of November or first of December.  "Alright, that's what I'll do.  Thanks for calling me back.  Have a blessed day."

## 2015-05-01 NOTE — Telephone Encounter (Signed)
"  I'm returning your phone call.

## 2015-05-06 ENCOUNTER — Other Ambulatory Visit: Payer: BLUE CROSS/BLUE SHIELD | Admitting: Internal Medicine

## 2015-05-07 ENCOUNTER — Encounter: Payer: BLUE CROSS/BLUE SHIELD | Admitting: Internal Medicine

## 2015-05-09 ENCOUNTER — Ambulatory Visit
Admission: RE | Admit: 2015-05-09 | Discharge: 2015-05-09 | Disposition: A | Discharge status: Home / Self Care | Payer: BLUE CROSS/BLUE SHIELD | Source: Ambulatory Visit

## 2015-05-09 DIAGNOSIS — Z1231 Encounter for screening mammogram for malignant neoplasm of breast: Secondary | ICD-10-CM

## 2015-05-10 ENCOUNTER — Other Ambulatory Visit: Payer: BLUE CROSS/BLUE SHIELD | Admitting: Internal Medicine

## 2015-05-10 ENCOUNTER — Encounter: Payer: Self-pay | Admitting: Women's Health

## 2015-05-10 DIAGNOSIS — E119 Type 2 diabetes mellitus without complications: Secondary | ICD-10-CM

## 2015-05-10 DIAGNOSIS — Z130 Encounter for screening for diseases of the blood and blood-forming organs and certain disorders involving the immune mechanism: Secondary | ICD-10-CM

## 2015-05-10 DIAGNOSIS — Z13220 Encounter for screening for lipoid disorders: Secondary | ICD-10-CM

## 2015-05-10 DIAGNOSIS — Z0000 Encounter for general adult medical examination without abnormal findings: Secondary | ICD-10-CM

## 2015-05-10 DIAGNOSIS — Z1329 Encounter for screening for other suspected endocrine disorder: Secondary | ICD-10-CM

## 2015-05-10 DIAGNOSIS — Z1321 Encounter for screening for nutritional disorder: Secondary | ICD-10-CM

## 2015-05-10 LAB — CBC WITH DIFFERENTIAL/PLATELET
Basophils Absolute: 0.1 10*3/uL (ref 0.0–0.1)
Basophils Relative: 1 % (ref 0–1)
Eosinophils Absolute: 0.3 10*3/uL (ref 0.0–0.7)
Eosinophils Relative: 5 % (ref 0–5)
HCT: 39.2 % (ref 36.0–46.0)
Hemoglobin: 13.1 g/dL (ref 12.0–15.0)
Lymphocytes Relative: 23 % (ref 12–46)
Lymphs Abs: 1.3 10*3/uL (ref 0.7–4.0)
MCH: 26.6 pg (ref 26.0–34.0)
MCHC: 33.4 g/dL (ref 30.0–36.0)
MCV: 79.5 fL (ref 78.0–100.0)
MPV: 9.2 fL (ref 8.6–12.4)
Monocytes Absolute: 0.4 10*3/uL (ref 0.1–1.0)
Monocytes Relative: 7 % (ref 3–12)
Neutro Abs: 3.7 10*3/uL (ref 1.7–7.7)
Neutrophils Relative %: 64 % (ref 43–77)
Platelets: 252 10*3/uL (ref 150–400)
RBC: 4.93 MIL/uL (ref 3.87–5.11)
RDW: 14.8 % (ref 11.5–15.5)
WBC: 5.8 10*3/uL (ref 4.0–10.5)

## 2015-05-10 LAB — COMPLETE METABOLIC PANEL WITH GFR
ALT: 20 U/L (ref 6–29)
AST: 17 U/L (ref 10–35)
Albumin: 4.2 g/dL (ref 3.6–5.1)
Alkaline Phosphatase: 61 U/L (ref 33–130)
BUN: 12 mg/dL (ref 7–25)
CO2: 27 mmol/L (ref 20–31)
Calcium: 9.6 mg/dL (ref 8.6–10.4)
Chloride: 101 mmol/L (ref 98–110)
Creat: 0.66 mg/dL (ref 0.50–0.99)
GFR, Est African American: >89 mL/min (ref >=60)
GFR, Est Non African American: >89 mL/min (ref >=60)
Glucose, Bld: 91 mg/dL (ref 65–99)
Potassium: 4.3 mmol/L (ref 3.5–5.3)
Sodium: 139 mmol/L (ref 135–146)
Total Bilirubin: 0.5 mg/dL (ref 0.2–1.2)
Total Protein: 7.1 g/dL (ref 6.1–8.1)

## 2015-05-10 LAB — HEMOGLOBIN A1C
Hgb A1c MFr Bld: 6.1 % — ABNORMAL HIGH (ref <5.7)
Mean Plasma Glucose: 128 mg/dL — ABNORMAL HIGH (ref <117)

## 2015-05-10 LAB — TSH: TSH: 1.465 u[IU]/mL (ref 0.350–4.500)

## 2015-05-10 LAB — LIPID PANEL
Cholesterol: 154 mg/dL (ref 125–200)
HDL: 51 mg/dL (ref >=46)
LDL Cholesterol: 82 mg/dL (ref <130)
Total CHOL/HDL Ratio: 3 Ratio (ref <=5.0)
Triglycerides: 105 mg/dL (ref <150)
VLDL: 21 mg/dL (ref <30)

## 2015-05-11 LAB — VITAMIN D 25 HYDROXY (VIT D DEFICIENCY, FRACTURES): Vit D, 25-Hydroxy: 34 ng/mL (ref 30–100)

## 2015-05-14 ENCOUNTER — Encounter: Payer: Self-pay | Admitting: Internal Medicine

## 2015-05-14 ENCOUNTER — Ambulatory Visit (INDEPENDENT_AMBULATORY_CARE_PROVIDER_SITE_OTHER): Payer: BLUE CROSS/BLUE SHIELD | Admitting: Internal Medicine

## 2015-05-14 VITALS — BP 136/78 | HR 81 | Temp 98.6°F | Ht 67.0 in | Wt 282.0 lb

## 2015-05-14 DIAGNOSIS — K219 Gastro-esophageal reflux disease without esophagitis: Secondary | ICD-10-CM

## 2015-05-14 DIAGNOSIS — F411 Generalized anxiety disorder: Secondary | ICD-10-CM | POA: Diagnosis not present

## 2015-05-14 DIAGNOSIS — G4700 Insomnia, unspecified: Secondary | ICD-10-CM

## 2015-05-14 DIAGNOSIS — E039 Hypothyroidism, unspecified: Secondary | ICD-10-CM

## 2015-05-14 DIAGNOSIS — Z8709 Personal history of other diseases of the respiratory system: Secondary | ICD-10-CM | POA: Diagnosis not present

## 2015-05-14 DIAGNOSIS — E785 Hyperlipidemia, unspecified: Secondary | ICD-10-CM | POA: Diagnosis not present

## 2015-05-14 DIAGNOSIS — E669 Obesity, unspecified: Secondary | ICD-10-CM | POA: Diagnosis not present

## 2015-05-14 DIAGNOSIS — Z8669 Personal history of other diseases of the nervous system and sense organs: Secondary | ICD-10-CM | POA: Diagnosis not present

## 2015-05-14 DIAGNOSIS — Z86010 Personal history of colonic polyps: Secondary | ICD-10-CM | POA: Diagnosis not present

## 2015-05-14 DIAGNOSIS — G935 Compression of brain: Secondary | ICD-10-CM | POA: Diagnosis not present

## 2015-05-14 DIAGNOSIS — E119 Type 2 diabetes mellitus without complications: Secondary | ICD-10-CM | POA: Diagnosis not present

## 2015-05-14 DIAGNOSIS — Z23 Encounter for immunization: Secondary | ICD-10-CM | POA: Diagnosis not present

## 2015-05-14 DIAGNOSIS — Z8639 Personal history of other endocrine, nutritional and metabolic disease: Secondary | ICD-10-CM | POA: Diagnosis not present

## 2015-05-14 DIAGNOSIS — Z0000 Encounter for general adult medical examination without abnormal findings: Secondary | ICD-10-CM

## 2015-05-14 LAB — POCT URINALYSIS DIPSTICK
Bilirubin, UA: NEGATIVE
Blood, UA: NEGATIVE
Glucose, UA: NEGATIVE
Ketones, UA: NEGATIVE
Leukocytes, UA: NEGATIVE
Nitrite, UA: NEGATIVE
Protein, UA: NEGATIVE
Spec Grav, UA: 1.01
Urobilinogen, UA: NEGATIVE
pH, UA: 6

## 2015-05-14 NOTE — Patient Instructions (Addendum)
Continue same meds. Take metformin once a day. It was a pleasure to see you today. Watch diet. Try to exercise. Flu vaccine given.

## 2015-05-15 LAB — MICROALBUMIN / CREATININE URINE RATIO
Creatinine, Urine: 32.1 mg/dL
Microalb, Ur: <0.2 mg/dL (ref <2.0)

## 2015-05-20 ENCOUNTER — Ambulatory Visit: Payer: Self-pay | Admitting: Dietician

## 2015-05-24 ENCOUNTER — Other Ambulatory Visit: Payer: BLUE CROSS/BLUE SHIELD | Admitting: Internal Medicine

## 2015-05-27 ENCOUNTER — Encounter: Payer: BLUE CROSS/BLUE SHIELD | Admitting: Internal Medicine

## 2015-06-15 ENCOUNTER — Other Ambulatory Visit: Payer: Self-pay | Admitting: Internal Medicine

## 2015-06-29 ENCOUNTER — Other Ambulatory Visit: Payer: Self-pay | Admitting: Internal Medicine

## 2015-07-01 NOTE — Telephone Encounter (Signed)
Per Dr. Renold Genta refill with 5 additional refills #30  Attempted to contact pharmacy and the number is not in service at the moment will call back at a later time.

## 2015-07-01 NOTE — Telephone Encounter (Signed)
Refill x 6 months 

## 2015-07-02 ENCOUNTER — Ambulatory Visit: Payer: BLUE CROSS/BLUE SHIELD | Admitting: Dietician

## 2015-07-03 NOTE — Telephone Encounter (Signed)
Called to pharmacy 

## 2015-07-05 ENCOUNTER — Telehealth: Payer: Self-pay | Admitting: *Deleted

## 2015-07-10 NOTE — Telephone Encounter (Signed)
"  We spoke about a month ago about having surgery there.  I'm looking to see if I could possibly get this done the first week of January or should I wait a month or so to do that?  Call me on my house phone and leave me a message.  It's the left foot, need to get that spur out of there."  I left patient a message that I would schedule it for 09/11/2015.  Please call if this date isn't good for you.

## 2015-07-11 ENCOUNTER — Telehealth: Payer: Self-pay | Admitting: *Deleted

## 2015-07-11 NOTE — Telephone Encounter (Signed)
"  I'm returning your call from last night.  We were talking about January 4th being my surgical date.  That will be fine, I spoke to my manager and she said that would be okay.  If possible, I'd like to be the first one on the list.  I'm Pre-Diabetic.  I take Diabetic medicine.  Sometimes late in the day is hard on me.  Call and let me know what else I need to do.  Do I need to follow up with my doctor?  What will my deductible be?  Please get back with me.  Thank you."

## 2015-07-17 NOTE — Telephone Encounter (Signed)
Lady called me from the surgical center about the deductible.  I haven't met it all but I'm going forward with the procedure.  I need to get information about the FMLA and move forward.  Thank you

## 2015-07-18 NOTE — Telephone Encounter (Signed)
I called and left her a message that I received her message.  We will proceed.  I have you scheduled for 09/11/2015.  If you have FMLA papers that need to be filled out, Helayne Seminole takes care of that at our [place] office.  Bring it by the office and we will get it to her.

## 2015-07-19 ENCOUNTER — Telehealth: Payer: Self-pay | Admitting: *Deleted

## 2015-07-19 NOTE — Telephone Encounter (Signed)
"  Thank you for calling me back and leaving me a message.  I need a note for work stating when my surgery is going to be.  How long will I be out of work?  I will have FMLA papers that need to be filled out.  Do I bring those to you?"  You can come by the office to get a note for work.  You're having Hammer Toe procedure so you could be out of work for about 4-6 weeks.  FMLA papers are completed by Marcie Bal, she works in our [place] location.  You can bring it by the office and we will get it to her if you like.  "I'm going to come by there today to get a note for work."

## 2015-07-25 ENCOUNTER — Other Ambulatory Visit: Payer: Self-pay | Admitting: Internal Medicine

## 2015-07-29 ENCOUNTER — Encounter: Payer: Self-pay | Admitting: Podiatry

## 2015-08-14 ENCOUNTER — Telehealth: Payer: Self-pay | Admitting: *Deleted

## 2015-08-14 NOTE — Telephone Encounter (Signed)
"  I need to cancel my surgery.  I fell in my back yard trying to help my dog.  I torn a ligament in my hip.  So I need to postpone surgery.  I hope it's not causing a problem.  I am willing to pay something if I need to."  Okay, make sure you take care of yourself.  I'll get that canceled.  I called the surgical center and canceled surgery.

## 2015-08-31 ENCOUNTER — Other Ambulatory Visit: Payer: Self-pay | Admitting: Internal Medicine

## 2015-09-02 NOTE — Progress Notes (Signed)
Subjective:    Patient ID: Mckenzie Hebert, female    DOB: 06/14/1956, 59 y.o.   MRN: [id number]  HPI Pleasant 59 year old White Female in today for health maintenance exam and evaluation of medical problems. She has history of asthma, eczema, hyperplastic colon polyps, insomnia, urticaria, history of migraine headaches, hypothyroidism, history of vitamin D deficiency, history of Chiari 1 malformation, anxiety, GE reflux. History of obesity and impaired glucose tolerance.  Patient had H pylori diagnosed in 2009. Had angioedema of the lip in 2010. Had herpes zoster June 2011. History of asthma treated with Advair and Singulair.  [facility] gynecology is GYN physician.  Patient had colonoscopy but Dr. Collene Mares July 2007. She takes laxatives approximate 3 times a week for functional constipation.  Has history of palpitations which started with working evening shifts.  Had issues with sleeping related to working evening shifts.  Left knee arthroscopy 2007. Appendectomy 1993. Bone spur removed from left foot 1999. Bone spur removed from right foot 2008. Left knee surgery December 2008.  Pneumovax 20 11/24/2005.  Social history: Patient is a widow. Husband died of heart problems. No children. She volunteers with Hospice. She smoked until the age of 74 and then quit. No alcohol consumption. Enjoys dogs. She works as a Teacher, adult education at [employer].  Family history: Parents are deceased. Mother died at age 57 with history of hypertension, coronary artery disease, MI, status post CABG. Father died in his 64s with history of  Lung cancer. Mother had diabetes and hypertension. 2 sisters. One sister with history of diabetes and hypertension. 2 brothers.      Review of Systems  Constitutional: Negative.   HENT: Negative.   Respiratory:       History of asthma  Gastrointestinal:       GE reflux  Endocrine:       Hypothyroidism  Neurological:       History of migraine  headaches. Has been evaluated by headache specialist in the remote past  Psychiatric/Behavioral:       Anxiety and insomnia       Objective:   Physical Exam  Constitutional: She is oriented to person, place, and time. She appears well-developed and well-nourished. No distress.  HENT:  Head: Normocephalic and atraumatic.  Right Ear: External ear normal.  Left Ear: External ear normal.  Mouth/Throat: Oropharynx is clear and moist. No oropharyngeal exudate.  Eyes: Conjunctivae and EOM are normal. Pupils are equal, round, and reactive to light. Right eye exhibits no discharge. Left eye exhibits no discharge. No scleral icterus.  Neck: Neck supple. No JVD present. No thyromegaly present.  Cardiovascular: Normal rate, regular rhythm and normal heart sounds.   No murmur heard. Pulmonary/Chest: Effort normal and breath sounds normal. No respiratory distress. She has no wheezes. She has no rales.  Breasts normal female without masses  Abdominal: Soft. Bowel sounds are normal. She exhibits no distension and no mass. There is no tenderness. There is no rebound and no guarding.  Genitourinary:  Deferred to GYN physician at [facility]  Musculoskeletal: She exhibits no edema.  Lymphadenopathy:    She has no cervical adenopathy.  Neurological: She is alert and oriented to person, place, and time. She has normal reflexes.  Skin: Skin is warm and dry. No rash noted. She is not diaphoretic.  Psychiatric: She has a normal mood and affect. Her behavior is normal. Judgment and thought content normal.  Vitals reviewed.         Assessment & Plan:  Obesity-encouraged diet and exercise  Anxiety  Insomnia  Hyperlipidemia-lipid panel liver functions stable on statin medication  Hyperplastic colon polyps  History of urticaria  History of migraine headaches-stable  Hypothyroidism-stable on thyroid replacement  History of vitamin D deficiency  History of Chiari I  malformation  Impaired glucose tolerance-hemoglobin A1c 6.1% and previously was 6.2%. Wants to take metformin once daily and may do so instead of splitting it 500 mg twice daily she may take 1000 mg daily.  GE reflux treated with Nexium  Plan: Continue same medications and return in 6 months for office visit and check on hypothyroidism with TSH needing to be done at that time as well as hemoglobin A1c. Recommend annual flu vaccine. Has had pneumococcal 23 vaccine.

## 2015-09-03 ENCOUNTER — Encounter: Payer: Self-pay | Admitting: Podiatry

## 2015-09-03 ENCOUNTER — Ambulatory Visit (INDEPENDENT_AMBULATORY_CARE_PROVIDER_SITE_OTHER): Payer: BLUE CROSS/BLUE SHIELD | Admitting: Podiatry

## 2015-09-03 VITALS — BP 110/82 | HR 71 | Resp 16

## 2015-09-03 DIAGNOSIS — M21612 Bunion of left foot: Secondary | ICD-10-CM

## 2015-09-03 DIAGNOSIS — M2042 Other hammer toe(s) (acquired), left foot: Secondary | ICD-10-CM

## 2015-09-03 DIAGNOSIS — M2140 Flat foot [pes planus] (acquired), unspecified foot: Secondary | ICD-10-CM | POA: Diagnosis not present

## 2015-09-03 DIAGNOSIS — L84 Corns and callosities: Secondary | ICD-10-CM | POA: Diagnosis not present

## 2015-09-05 NOTE — Progress Notes (Signed)
Patient ID: Mckenzie Hebert, female   DOB: 10-Jan-1956, 59 y.o.   MRN: [id number]  Subjective: 59 year old female presented also with concerns of left fifth toe pain. She states the orthotics she has a throbbing her foot/toe overall. She feels that overall the orthotics that she's had made previously are not fitting correctly. She's been having some hip pain as well. Denies any recent injury or trauma. No swelling or redness of the foot. She says have surgery for hammertoe correction of the fifth toe next week however due to her hip injury that she recently had she's going off on surgery.  No other complaints at this time in no acute changes his last appointment.  Objective: AAO 3, NAD DP/PT pulses palpable, CRT less than 3 seconds Protective sensation intact with Simms Weinstein monofilament, vibratory sensation intact, Achilles tendon reflex intact. There is adductovarus deformity left and right fifth toe with a hyperkeratotic lesion over the dorsal lateral aspect of the fifth digit. There is tenderness to palpation overlying the area. There is no overlying erythema, increase in warmth. The underlying skin is intact after debridement. 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 There is no pain or crepitation with first MTPJ. There is a small firm soft tissue mass of the medial band of the plantar fascial in the arch of the foot consistent with a plantar fibroma. There is no tenderness along the area and there is no overlying skin change. No other areas of tenderness to bilateral lower extremities. No open lesions or pre-ulcerative lesions elsewhere. No pain with calf compression, swelling, warmth, erythema.  Assessment: 59 year old female with symptomatic fifth toe deformity  Plan: -Treatment options discussed including all alternatives, risks, and complications -Hyperkeratotic lesions debrided without  complication/bleeding -Orthotics were evaluated today and they do not appear to to be fitting as well as it previously. I added pads to help increase the arch. We'll hold off on further orthotics at this point in her requests given all her other issues at this time. - Follow-up as needed or to reschedule surgery at her convenience. In the meantime call the office with any questions, concerns, change in symptoms.  Celesta Gentile, DPM

## 2015-09-24 ENCOUNTER — Encounter: Payer: Self-pay | Admitting: Internal Medicine

## 2015-09-24 ENCOUNTER — Ambulatory Visit (INDEPENDENT_AMBULATORY_CARE_PROVIDER_SITE_OTHER): Payer: BLUE CROSS/BLUE SHIELD | Admitting: Internal Medicine

## 2015-09-24 VITALS — BP 132/86 | HR 83 | Temp 97.7°F | Resp 20 | Ht 67.0 in | Wt 270.0 lb

## 2015-09-24 DIAGNOSIS — R5383 Other fatigue: Secondary | ICD-10-CM | POA: Diagnosis not present

## 2015-09-24 DIAGNOSIS — E785 Hyperlipidemia, unspecified: Secondary | ICD-10-CM

## 2015-09-24 DIAGNOSIS — K219 Gastro-esophageal reflux disease without esophagitis: Secondary | ICD-10-CM | POA: Diagnosis not present

## 2015-09-24 DIAGNOSIS — F411 Generalized anxiety disorder: Secondary | ICD-10-CM

## 2015-09-24 DIAGNOSIS — R55 Syncope and collapse: Secondary | ICD-10-CM | POA: Diagnosis not present

## 2015-09-24 DIAGNOSIS — Z8709 Personal history of other diseases of the respiratory system: Secondary | ICD-10-CM | POA: Diagnosis not present

## 2015-09-24 DIAGNOSIS — E039 Hypothyroidism, unspecified: Secondary | ICD-10-CM | POA: Diagnosis not present

## 2015-09-24 DIAGNOSIS — E669 Obesity, unspecified: Secondary | ICD-10-CM

## 2015-09-24 DIAGNOSIS — R7302 Impaired glucose tolerance (oral): Secondary | ICD-10-CM

## 2015-09-24 DIAGNOSIS — R079 Chest pain, unspecified: Secondary | ICD-10-CM | POA: Diagnosis not present

## 2015-09-24 LAB — CBC WITH DIFFERENTIAL/PLATELET
Basophils Absolute: 0.1 10*3/uL (ref 0.0–0.1)
Basophils Relative: 1 % (ref 0–1)
Eosinophils Absolute: 0.3 10*3/uL (ref 0.0–0.7)
Eosinophils Relative: 4 % (ref 0–5)
HCT: 38.8 % (ref 36.0–46.0)
Hemoglobin: 12.6 g/dL (ref 12.0–15.0)
Lymphocytes Relative: 29 % (ref 12–46)
Lymphs Abs: 2.5 10*3/uL (ref 0.7–4.0)
MCH: 25.7 pg — ABNORMAL LOW (ref 26.0–34.0)
MCHC: 32.5 g/dL (ref 30.0–36.0)
MCV: 79 fL (ref 78.0–100.0)
MPV: 9.5 fL (ref 8.6–12.4)
Monocytes Absolute: 0.7 10*3/uL (ref 0.1–1.0)
Monocytes Relative: 8 % (ref 3–12)
Neutro Abs: 4.9 10*3/uL (ref 1.7–7.7)
Neutrophils Relative %: 58 % (ref 43–77)
Platelets: 304 10*3/uL (ref 150–400)
RBC: 4.91 MIL/uL (ref 3.87–5.11)
RDW: 14.6 % (ref 11.5–15.5)
WBC: 8.5 10*3/uL (ref 4.0–10.5)

## 2015-09-24 LAB — COMPLETE METABOLIC PANEL WITH GFR
ALT: 27 U/L (ref 6–29)
AST: 23 U/L (ref 10–35)
Albumin: 4.1 g/dL (ref 3.6–5.1)
Alkaline Phosphatase: 57 U/L (ref 33–130)
BUN: 12 mg/dL (ref 7–25)
CO2: 27 mmol/L (ref 20–31)
Calcium: 9.2 mg/dL (ref 8.6–10.4)
Chloride: 101 mmol/L (ref 98–110)
Creat: 0.57 mg/dL (ref 0.50–0.99)
GFR, Est African American: >89 mL/min (ref >=60)
GFR, Est Non African American: >89 mL/min (ref >=60)
Glucose, Bld: 63 mg/dL — ABNORMAL LOW (ref 65–99)
Potassium: 4.1 mmol/L (ref 3.5–5.3)
Sodium: 137 mmol/L (ref 135–146)
Total Bilirubin: 0.4 mg/dL (ref 0.2–1.2)
Total Protein: 6.6 g/dL (ref 6.1–8.1)

## 2015-09-24 NOTE — Patient Instructions (Addendum)
Lab work drawn and pending. EKG appears to be within normal limits. Arrange cardiology evaluation regarding chest pain. Keep glucose tablets in car

## 2015-09-24 NOTE — Progress Notes (Signed)
   Subjective:    Patient ID: Mckenzie Hebert, female    DOB: Oct 22, 1955, 60 y.o.   MRN: [id number]  HPI  60 year old White Female in today with complaint of near syncopal episode yesterday. She had eaten ham and cheese sandwich around 12 noon. Then around 3 PM she had gone to a bookstore and felt a bit weak. She did not actually have syncope but felt a bit dizzy and lightheaded. Subsequently went [employer] and ate additional food. Felt a bit lightheaded driving home. Does not keep Accu-Chek machine in her car. When she arrived home her Accu-Chek was 124. She takes metformin at night. She also had a protein drink early afternoon.  On Sunday, January 15 she had some substernal chest pain into her right shoulder. It subsequently subsided. She did not seek medical attention. I think she is worried because husband had heart issues. She resides alone.  She saw Dr. Percival Spanish in 2015 for evaluation of palpitations. At that time she had PACs and PVCs on EKG. Today she is in sinus rhythm with no change from previous EKGs. Has incomplete right bundle branch block.  Patient is a widow and lives alone. 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 Some anxiety at times. History of asthma. History of hypothyroidism.    Review of Systems no chest pain today, no shortness of breath or lower extremity edema. She thinks her Accu-Chek machine may not be accurate.     Objective:   Physical Exam  Skin warm and dry. Nodes none. Neck is supple without JVD thyromegaly or carotid bruits. Chest clear to auscultation without rales or wheezing. Cardiac exam regular rate and rhythm normal S1 and S2 without ectopy. Extremities without edema.      Assessment & Plan:  Episode of near syncope-likely had hypoglycemia or vasovagal reaction. Patient was reassured. CBC and see med are normal with the exception of nonfasting glucose of 63. Patient could've had a hypoglycemic episode yesterday. She was given glucose  tablets to keep in her car.  Chest pain-may need further evaluation with cardiologist. She is agreeable to cardiology consult  Hypothyroidism-TSH checked and is within normal limits  Impaired glucose tolerance treated with metformin. Hemoglobin A1c 6.3%  Obesity  History of asthma-stable  Anxiety-has antianxiety medication prescribed

## 2015-09-25 LAB — HEMOGLOBIN A1C
Hgb A1c MFr Bld: 6.3 % — ABNORMAL HIGH (ref <5.7)
Mean Plasma Glucose: 134 mg/dL — ABNORMAL HIGH (ref <117)

## 2015-09-25 LAB — TSH: TSH: 2.647 u[IU]/mL (ref 0.350–4.500)

## 2015-10-07 ENCOUNTER — Ambulatory Visit (INDEPENDENT_AMBULATORY_CARE_PROVIDER_SITE_OTHER): Payer: BLUE CROSS/BLUE SHIELD | Admitting: Physician Assistant

## 2015-10-07 ENCOUNTER — Encounter: Payer: Self-pay | Admitting: Physician Assistant

## 2015-10-07 VITALS — BP 150/86 | HR 78 | Ht 67.0 in | Wt 265.0 lb

## 2015-10-07 DIAGNOSIS — E119 Type 2 diabetes mellitus without complications: Secondary | ICD-10-CM

## 2015-10-07 DIAGNOSIS — R079 Chest pain, unspecified: Secondary | ICD-10-CM | POA: Diagnosis not present

## 2015-10-07 DIAGNOSIS — E669 Obesity, unspecified: Secondary | ICD-10-CM | POA: Diagnosis not present

## 2015-10-07 NOTE — Patient Instructions (Signed)
Your physician has requested that you have en exercise stress myoview. For further information please visit [email or web address]. Please follow instruction sheet, as given.   Your physician wants you to follow-up in: 6 months or sooner if needed. You will receive a reminder letter in the mail two months in advance. If you don't receive a letter, please call our office to schedule the follow-up appointment.  If you need a refill on your cardiac medications before your next appointment, please call your pharmacy.

## 2015-10-07 NOTE — Progress Notes (Signed)
Patient ID: Gonzella Lex, female   DOB: [birth year], 60 y.o.   MRN: [id number]    Date:  10/07/2015   ID:  Gonzella Lex, DOB 11/21/1955, MRN [id number]  PCP:  Elby Showers, MD  Primary Cardiologist:  [facility]   Chief Complaint  Patient presents with  . Follow-up    some chest pain, some shortness of breath, no swelling, no cramping, no dizziness or lightheadedness     History of Present Illness: TRYNA CROW is a 60 y.o. obese female  With a history of tachypalpitations, PVCs,   diabetes mellitus,  Migraines , hypothyroidism.  Patient was recently seen by Dr. Renold Genta because of dizziness and weakness.  Ms. Alvarenga checked her blood sugar was 124.   She also reported on the 15th she had substernal chest pressure radiating to her right shoulder which she thought was asked reflux.  She hasn't had a problem in the last couple weeks however in the office here she saying she has some mild chest pain.  She denies any associated symptoms with it. Her blood pressure today is mildly elevated however Dr. Verlene Mayer office it was well controlled. Patient is seeing a registered dietitian to help with weight loss. She is lost 17 pounds since September. Her mom did have coronary bypass grafting at age 36.   She is usually quite active does all her own yard work. He has not had any further dizziness  The patient currently denies nausea, vomiting, fever, chest pain, shortness of breath, orthopnea, PND, cough, congestion, abdominal pain, hematochezia, melena, lower extremity edema, claudication.  Wt Readings from Last 3 Encounters:  10/07/15 265 lb (120.203 kg)  09/24/15 270 lb (122.471 kg)  05/14/15 282 lb (127.914 kg)     Past Medical History  Diagnosis Date  . Bronchitis   . Migraines     Dr Catalina Gravel  . Colon polyps     Dr Collene Mares  . Hypothyroid   . AC (acromioclavicular) joint bone spurs 2008    right foot  . Endometrial polyp   . Uterine fibroid     Current Outpatient Prescriptions    Medication Sig Dispense Refill  . ALPRAZolam (XANAX) 1 MG tablet TAKE 1 TABLET BY MOUTH AT BEDTIME 30 tablet 5  . Cholecalciferol (VITAMIN D3) 1000 UNITS CAPS Take 2,000 Int'l Units by mouth.     . cyclobenzaprine (FLEXERIL) 10 MG tablet as needed.     Marland Kitchen esomeprazole (NEXIUM) 40 MG capsule REQUIRES P/A*SENT**TAKE 1 CAPSULE (40 MG TOTAL) BY MOUTH DAILY BEFORE BREAKFAST. 30 capsule 5  . levothyroxine (SYNTHROID, LEVOTHROID) 125 MCG tablet TAKE 1 TABLET (125 MCG TOTAL) BY MOUTH DAILY. 30 tablet 3  . metFORMIN (GLUCOPHAGE) 500 MG tablet Take 500 mg by mouth at bedtime.    Marland Kitchen PROAIR HFA 108 (90 BASE) MCG/ACT inhaler INHALE 2 SPRAYS BY MOUTH 4 TIMES A DAY AS NEEDED 17 Inhaler 5  . simvastatin (ZOCOR) 10 MG tablet Take 1 tablet (10 mg total) by mouth at bedtime. 90 tablet 3   No current facility-administered medications for this visit.    Allergies:   No Known Allergies  Social History:  The patient  reports that she has quit smoking. She has never used smokeless tobacco. She reports that she does not drink alcohol or use illicit drugs.   Family history:   Family History  Problem Relation Age of Onset  . Diabetes Mother   . Hypertension Mother   . Heart disease Mother 21    CABG  .  Cancer Father     lung cancer  . Diabetes Sister   . Hypertension Sister   . Thyroid disease Sister     hyperthyroidism  . Breast cancer Paternal Aunt   . Leukemia Paternal Grandmother   . Mental retardation Other   . Hypertension Sister   . Autoimmune disease Sister     ROS:  Please see the history of present illness.  All other systems reviewed and negative.   PHYSICAL EXAM: VS:  BP 150/86 mmHg  Pulse 78  Ht 5\' 7"  (1.702 m)  Wt 265 lb (120.203 kg)  BMI 41.50 kg/m2  LMP 03/18/2008  obese, well developed, in no acute distress HEENT: Pupils are equal round react to light accommodation extraocular movements are intact.  Neck: no JVDNo cervical lymphadenopathy. Cardiac: Regular rate and rhythm  without murmurs rubs or gallops. Lungs:  clear to auscultation bilaterally, no wheezing, rhonchi or rales Abd: soft, nontender, positive bowel sounds all quadrants, no hepatosplenomegaly Ext: no lower extremity edema.  2+ radial and dorsalis pedis pulses. Skin: warm and dry Neuro:  Grossly normal  EKG:  Sinus rhythm with PVCs incomplete right bundle.   ASSESSMENT AND PLAN:  Problem List Items Addressed This Visit    Obesity, unspecified   Relevant Medications   metFORMIN (GLUCOPHAGE) 500 MG tablet   Controlled diabetes mellitus type II without complication (HCC)   Relevant Medications   metFORMIN (GLUCOPHAGE) 500 MG tablet   Chest pain - Primary   Relevant Orders   EKG 12-Lead   Myocardial Perfusion Imaging      Ms. Coviello 60 years old with risk factors for coronary disease including diabetes mellitus and obesity. She had chest pain Proxima 2 weeks ago and having some mild pain here in the office. Does not appear to be an exertional component.   She is active however, she does not exercise regularly. She is making efforts to lose weight and is seeing a Firefighter. She started loss 17 pounds since September. She currently takes Nexium for acid reflux. She also takes Zocor for cholesterol. We will check a treadmill Cardiolite make sure  Her pain is not ischemic  Related.   Follow-up in 6 months or sooner depending on stress test results.

## 2015-10-08 ENCOUNTER — Telehealth (HOSPITAL_COMMUNITY): Payer: Self-pay

## 2015-10-08 NOTE — Telephone Encounter (Signed)
Encounter complete. 

## 2015-10-10 ENCOUNTER — Ambulatory Visit (HOSPITAL_COMMUNITY)
Admission: RE | Admit: 2015-10-10 | Discharge: 2015-10-10 | Disposition: A | Discharge status: Home / Self Care | Payer: BLUE CROSS/BLUE SHIELD | Source: Ambulatory Visit | Attending: Internal Medicine | Admitting: Internal Medicine

## 2015-10-10 DIAGNOSIS — R0602 Shortness of breath: Secondary | ICD-10-CM | POA: Insufficient documentation

## 2015-10-10 DIAGNOSIS — Z6841 Body Mass Index (BMI) 40.0 and over, adult: Secondary | ICD-10-CM | POA: Diagnosis not present

## 2015-10-10 DIAGNOSIS — R079 Chest pain, unspecified: Secondary | ICD-10-CM | POA: Insufficient documentation

## 2015-10-10 DIAGNOSIS — Z8249 Family history of ischemic heart disease and other diseases of the circulatory system: Secondary | ICD-10-CM | POA: Insufficient documentation

## 2015-10-10 DIAGNOSIS — R5383 Other fatigue: Secondary | ICD-10-CM | POA: Insufficient documentation

## 2015-10-10 DIAGNOSIS — R42 Dizziness and giddiness: Secondary | ICD-10-CM | POA: Diagnosis not present

## 2015-10-10 DIAGNOSIS — R002 Palpitations: Secondary | ICD-10-CM | POA: Diagnosis not present

## 2015-10-10 DIAGNOSIS — Z87891 Personal history of nicotine dependence: Secondary | ICD-10-CM | POA: Insufficient documentation

## 2015-10-10 DIAGNOSIS — E119 Type 2 diabetes mellitus without complications: Secondary | ICD-10-CM | POA: Diagnosis not present

## 2015-10-10 DIAGNOSIS — E669 Obesity, unspecified: Secondary | ICD-10-CM | POA: Insufficient documentation

## 2015-10-10 DIAGNOSIS — R0609 Other forms of dyspnea: Secondary | ICD-10-CM | POA: Diagnosis not present

## 2015-10-10 DIAGNOSIS — I4510 Unspecified right bundle-branch block: Secondary | ICD-10-CM | POA: Insufficient documentation

## 2015-10-10 MED ORDER — TECHNETIUM TC 99M SESTAMIBI GENERIC - CARDIOLITE
28.0000 | Freq: Once | INTRAVENOUS | Status: AC | PRN
  Administered 2015-10-10: 28 via INTRAVENOUS

## 2015-10-11 ENCOUNTER — Ambulatory Visit (HOSPITAL_COMMUNITY)
Admission: RE | Admit: 2015-10-11 | Discharge: 2015-10-11 | Disposition: A | Discharge status: Home / Self Care | Payer: BLUE CROSS/BLUE SHIELD | Source: Ambulatory Visit | Attending: Cardiovascular Disease | Admitting: Cardiovascular Disease

## 2015-10-11 LAB — MYOCARDIAL PERFUSION IMAGING
Exercise duration (min): 6 min
LV dias vol: 105 mL
LV sys vol: 38 mL
MPHR: 160 {beats}/min
Peak HR: 137 {beats}/min
Percent HR: 85 %
RPE: 16
Rest HR: 78 {beats}/min
SDS: 5
SRS: 0
SSS: 5
TID: 1.16

## 2015-10-11 MED ORDER — TECHNETIUM TC 99M SESTAMIBI GENERIC - CARDIOLITE
28.5000 | Freq: Once | INTRAVENOUS | Status: AC | PRN
  Administered 2015-10-11: 28.5 via INTRAVENOUS

## 2015-10-14 ENCOUNTER — Telehealth: Payer: Self-pay | Admitting: Cardiology

## 2015-10-14 NOTE — Telephone Encounter (Signed)
New message  ° ° ° °Patient returning call back to nurse from Friday.   °

## 2015-10-14 NOTE — Telephone Encounter (Signed)
Advised patient of results   Notes Recorded by Brett Canales, PA-C on 10/11/2015 at 5:24 PM Please call [name] and let her know her stress test was normal. I tried calling at 1722hrs on Friday but she did not answer and no greeting on the phone to identify it as her's..  Thanks, [name], [facility]

## 2015-11-08 ENCOUNTER — Other Ambulatory Visit: Payer: BLUE CROSS/BLUE SHIELD | Admitting: Internal Medicine

## 2015-11-08 DIAGNOSIS — E039 Hypothyroidism, unspecified: Secondary | ICD-10-CM

## 2015-11-08 DIAGNOSIS — Z79899 Other long term (current) drug therapy: Secondary | ICD-10-CM

## 2015-11-08 DIAGNOSIS — E119 Type 2 diabetes mellitus without complications: Secondary | ICD-10-CM

## 2015-11-08 DIAGNOSIS — E785 Hyperlipidemia, unspecified: Secondary | ICD-10-CM

## 2015-11-08 LAB — LIPID PANEL
Cholesterol: 150 mg/dL (ref 125–200)
HDL: 53 mg/dL (ref >=46)
LDL Cholesterol: 76 mg/dL (ref <130)
Total CHOL/HDL Ratio: 2.8 Ratio (ref <=5.0)
Triglycerides: 106 mg/dL (ref <150)
VLDL: 21 mg/dL (ref <30)

## 2015-11-08 LAB — HEPATIC FUNCTION PANEL
ALT: 22 U/L (ref 6–29)
AST: 22 U/L (ref 10–35)
Albumin: 4.4 g/dL (ref 3.6–5.1)
Alkaline Phosphatase: 60 U/L (ref 33–130)
Bilirubin, Direct: 0.1 mg/dL (ref <=0.2)
Indirect Bilirubin: 0.4 mg/dL (ref 0.2–1.2)
Total Bilirubin: 0.5 mg/dL (ref 0.2–1.2)
Total Protein: 7.2 g/dL (ref 6.1–8.1)

## 2015-11-08 LAB — HEMOGLOBIN A1C
Hgb A1c MFr Bld: 6 % — ABNORMAL HIGH (ref <5.7)
Mean Plasma Glucose: 126 mg/dL — ABNORMAL HIGH (ref <117)

## 2015-11-08 LAB — TSH: TSH: 0.99 mIU/L

## 2015-11-11 ENCOUNTER — Ambulatory Visit (INDEPENDENT_AMBULATORY_CARE_PROVIDER_SITE_OTHER): Payer: BLUE CROSS/BLUE SHIELD | Admitting: Internal Medicine

## 2015-11-11 ENCOUNTER — Encounter: Payer: Self-pay | Admitting: Internal Medicine

## 2015-11-11 VITALS — BP 118/62 | HR 84 | Temp 98.5°F | Resp 18 | Ht 67.0 in | Wt 256.0 lb

## 2015-11-11 DIAGNOSIS — E039 Hypothyroidism, unspecified: Secondary | ICD-10-CM | POA: Diagnosis not present

## 2015-11-11 DIAGNOSIS — E785 Hyperlipidemia, unspecified: Secondary | ICD-10-CM | POA: Diagnosis not present

## 2015-11-11 DIAGNOSIS — E669 Obesity, unspecified: Secondary | ICD-10-CM

## 2015-11-11 DIAGNOSIS — E119 Type 2 diabetes mellitus without complications: Secondary | ICD-10-CM

## 2015-11-11 DIAGNOSIS — E8881 Metabolic syndrome: Secondary | ICD-10-CM

## 2015-11-11 NOTE — Patient Instructions (Addendum)
Continue diet and exercise efforts. I'm pleased with your progress. Return in 6 months.

## 2015-11-11 NOTE — Progress Notes (Signed)
   Subjective:    Patient ID: Mckenzie Hebert, female    DOB: January 04, 1956, 60 y.o.   MRN: [id number]  HPI She is here today to follow-up on hypothyroidism, controlled type 2 diabetes mellitus, obesity, palpitations and chest pain. She was seen by a 41 assistant at Cardiology office. Had myocardial perfusion study which was normal. This was after having chest pains, palpitations and near syncope. She's been working with a Microbiologist, [employer]. Previous weight September 2016 was 282 pounds. Now weighs 256 pounds. Hemoglobin A1c is excellent at 6% and previously was 6.3%. Lipid panel and liver functions are normal. TSH is normal.    Review of Systems no new complaints     Objective:   Physical Exam  Neck is supple without JVD thyromegaly or carotid bruits. Chest clear to auscultation. Cardiac exam regular rate and rhythm normal S1 and S2. Extremities without edema.      Assessment & Plan:  Controlled type 2 diabetes mellitus-improved hemoglobin A1c  Hyperlipidemia-improved and normal. LDL was 117 in April 2016 and is now normal  Hypothyroidism-TSH normal  Obesity-successful with weight loss of the past 6 months  Metabolic syndrome  Plan: Return in 6 months for physical examination. Continue diet and exercise regimen. I'm pleased with her progress.

## 2015-11-12 LAB — MICROALBUMIN, URINE: Microalb, Ur: <0.2 mg/dL

## 2015-11-13 ENCOUNTER — Other Ambulatory Visit: Payer: Self-pay | Admitting: Internal Medicine

## 2015-11-15 ENCOUNTER — Other Ambulatory Visit: Payer: Self-pay | Admitting: Internal Medicine

## 2015-12-15 ENCOUNTER — Other Ambulatory Visit: Payer: Self-pay | Admitting: Internal Medicine

## 2016-01-08 ENCOUNTER — Encounter: Payer: BLUE CROSS/BLUE SHIELD | Admitting: Women's Health

## 2016-01-30 ENCOUNTER — Other Ambulatory Visit: Payer: Self-pay | Admitting: Internal Medicine

## 2016-01-30 NOTE — Telephone Encounter (Signed)
Refill x 6 months 

## 2016-02-04 ENCOUNTER — Other Ambulatory Visit: Payer: Self-pay | Admitting: Internal Medicine

## 2016-02-12 ENCOUNTER — Other Ambulatory Visit: Payer: Self-pay | Admitting: Orthopedic Surgery

## 2016-02-18 ENCOUNTER — Encounter: Payer: BLUE CROSS/BLUE SHIELD | Admitting: Women's Health

## 2016-03-11 ENCOUNTER — Encounter: Payer: Self-pay | Admitting: Women's Health

## 2016-03-11 ENCOUNTER — Ambulatory Visit (INDEPENDENT_AMBULATORY_CARE_PROVIDER_SITE_OTHER): Payer: BLUE CROSS/BLUE SHIELD | Admitting: Women's Health

## 2016-03-11 VITALS — BP 120/79 | Ht 66.0 in | Wt 269.2 lb

## 2016-03-11 DIAGNOSIS — Z01419 Encounter for gynecological examination (general) (routine) without abnormal findings: Secondary | ICD-10-CM

## 2016-03-11 NOTE — Progress Notes (Signed)
JAYMES KAMERMAN 1956/06/03 [id number]    History:    Presents for annual exam.  Postmenopausal with occasional hot flashes, no HRT, no bleeding.  Reports did not take Effexor prescribed last year for hot flashes because did not want to take another medication she would have to be weaned off of.  No sexual activity/widow.  Normal Pap and mammogram history.  03-Jan-2015 DEXA normal.  Jan 02, 2006 benign colon polyp.  History of hypothyroidism, diabetes managed by primary care physician.  Depression since husband passed away in 01/03/2007, was seeing a counselor and now talks with a nutritionist who counsels her.  Reports surgery one month ago to remove plantar fibroma from left foot, follow-up scheduled with Orthopedist next week, reports small sore area LUQ left breast near axilla since surgery, possibly positional related.     Past medical history, past surgical history, family history and social history were all reviewed and documented in the EPIC chart.  Works at [employer].  Has been working with a nutritionist, trainer for exercise.  Sister Insulin-dependent diabetes.  Mother diabetes, hypertension, stroke.  Fosters and rescue dogs. ROS:  A ROS was performed and pertinent positives and negatives are included.  Exam:  Filed Vitals:   03/11/16 1443  BP: 120/79    General appearance:  Normal Thyroid:  Symmetrical, normal in size, without palpable masses or nodularity. Respiratory  Auscultation:  Clear without wheezing or rhonchi Cardiovascular  Auscultation:  Regular rate, without rubs, murmurs or gallops  Edema/varicosities:  Not grossly evident Abdominal  Soft,nontender, without masses, guarding or rebound.  Liver/spleen:  No organomegaly noted  Hernia:  None appreciated  Skin  Inspection:  Grossly normal. Healing scar plantar surface of left foot from surgery 1 month ago.   Breasts: Examined lying and sitting. No tenderness with breast exam    Right: Without masses, retractions, discharge or  axillary adenopathy.     Left: Without masses, retractions, discharge or axillary adenopathy. Gentitourinary   Inguinal/mons:  Normal without inguinal adenopathy  External genitalia:  Normal  BUS/Urethra/Skene's glands:  Normal  Vagina:  Normal  Cervix:  Normal  Uterus:  Normal in size, shape and contour.  Midline and mobile  Adnexa/parametria:     Rt: Without masses or tenderness.   Lt: Without masses or tenderness.  Anus and perineum: Normal  Digital rectal exam: Normal sphincter tone without palpated masses or tenderness  Assessment/Plan:  60 y.o. WWF G1P0  for annual exam with no complaints   Postmenopausal on no HRT, no bleeding Obesity Hypothyroidism, Diabetes - labs and meds managed by primary care physician  Plan: SBE's, annual 3D mammogram, history of dense breasts.  Encouraged to continue working with nutritionist and trainer for weight loss, continue counseling for depression.  2014-01-02 Pap normal, screening guidelines reviewed.  Encouraged to schedule colonoscopy this year.     Huel Cote [facility], 3:27 PM 03/11/2016

## 2016-03-11 NOTE — Patient Instructions (Signed)

## 2016-03-13 ENCOUNTER — Other Ambulatory Visit: Payer: Self-pay

## 2016-03-13 MED ORDER — FLUTICASONE-SALMETEROL 250-50 MCG/DOSE IN AEPB
1.0000 | INHALATION_SPRAY | Freq: Two times a day (BID) | RESPIRATORY_TRACT | Status: DC
Start: 2016-03-13 — End: 2017-05-13

## 2016-03-13 NOTE — Telephone Encounter (Signed)
Per michelle she called to pharmacy

## 2016-03-28 ENCOUNTER — Other Ambulatory Visit: Payer: Self-pay | Admitting: Internal Medicine

## 2016-03-31 ENCOUNTER — Telehealth: Payer: Self-pay | Admitting: Internal Medicine

## 2016-03-31 NOTE — Telephone Encounter (Signed)
Switch to Protonix 40 mg daily. I will write note for gym.

## 2016-03-31 NOTE — Telephone Encounter (Signed)
Currently taking Nexium.  Her insurance is no longer going to cover it.  They will cover Prilosec or Protonix.  Would you please consider switching her over to one of those so that her insurance will continue to cover this Rx?   Pharmacy:  [facility] on [address].  She also would like to have a doctor's note for the gym.  She needs a note that states she can be released to exercise at the gym please.

## 2016-04-01 MED ORDER — PANTOPRAZOLE SODIUM 40 MG PO TBEC: 40.0000 mg | DELAYED_RELEASE_TABLET | Freq: Every day | ORAL | 11 refills | Status: DC

## 2016-04-01 NOTE — Addendum Note (Signed)
Addended by: Milta Deiters on: 04/01/2016 05:02 PM   Modules accepted: Orders

## 2016-04-06 ENCOUNTER — Other Ambulatory Visit: Payer: Self-pay | Admitting: Internal Medicine

## 2016-04-06 DIAGNOSIS — Z1231 Encounter for screening mammogram for malignant neoplasm of breast: Secondary | ICD-10-CM

## 2016-04-10 ENCOUNTER — Encounter: Payer: Self-pay | Admitting: Internal Medicine

## 2016-04-10 ENCOUNTER — Ambulatory Visit (INDEPENDENT_AMBULATORY_CARE_PROVIDER_SITE_OTHER): Payer: BLUE CROSS/BLUE SHIELD | Admitting: Internal Medicine

## 2016-04-10 VITALS — BP 120/78 | HR 72 | Wt 266.0 lb

## 2016-04-10 DIAGNOSIS — B009 Herpesviral infection, unspecified: Secondary | ICD-10-CM | POA: Diagnosis not present

## 2016-04-10 MED ORDER — VALACYCLOVIR HCL 500 MG PO TABS: ORAL_TABLET | ORAL | 1 refills | Status: DC

## 2016-04-10 NOTE — Progress Notes (Signed)
   Subjective:    Patient ID: Mckenzie Hebert, female    DOB: 08-24-1956, 60 y.o.   MRN: [id number]  HPI Several days ago patient developed fever blister below lower lip midline area. Doesn't recall being out in the sun or having excessive stress. Has not had one in a long time.    Review of Systems     Objective:   Physical Exam 4 mm circular area below lower lip midline consistent with HSV type I infection. May have some secondary bacterial infection.       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 Bactroban ointment to lesion twice daily for 5 days

## 2016-04-10 NOTE — Patient Instructions (Signed)
Apply Bactroban ointment to lesion twice daily for 5 days. Valtrex 500 mg twice daily for 5 days. It was a pleasure to see you today.

## 2016-05-12 ENCOUNTER — Ambulatory Visit
Admission: RE | Admit: 2016-05-12 | Discharge: 2016-05-12 | Disposition: A | Discharge status: Home / Self Care | Payer: BLUE CROSS/BLUE SHIELD | Source: Ambulatory Visit | Attending: Internal Medicine | Admitting: Internal Medicine

## 2016-05-12 ENCOUNTER — Other Ambulatory Visit: Payer: BLUE CROSS/BLUE SHIELD | Admitting: Internal Medicine

## 2016-05-12 DIAGNOSIS — Z1231 Encounter for screening mammogram for malignant neoplasm of breast: Secondary | ICD-10-CM

## 2016-05-12 DIAGNOSIS — Z0000 Encounter for general adult medical examination without abnormal findings: Secondary | ICD-10-CM

## 2016-05-12 DIAGNOSIS — F419 Anxiety disorder, unspecified: Secondary | ICD-10-CM

## 2016-05-12 DIAGNOSIS — Z8669 Personal history of other diseases of the nervous system and sense organs: Secondary | ICD-10-CM

## 2016-05-12 DIAGNOSIS — E039 Hypothyroidism, unspecified: Secondary | ICD-10-CM

## 2016-05-12 DIAGNOSIS — E669 Obesity, unspecified: Secondary | ICD-10-CM

## 2016-05-12 LAB — LIPID PANEL
Cholesterol: 160 mg/dL (ref 125–200)
HDL: 56 mg/dL (ref >=46)
LDL Cholesterol: 87 mg/dL (ref <130)
Total CHOL/HDL Ratio: 2.9 Ratio (ref <=5.0)
Triglycerides: 87 mg/dL (ref <150)
VLDL: 17 mg/dL (ref <30)

## 2016-05-12 LAB — TSH: TSH: 1.63 mIU/L

## 2016-05-13 ENCOUNTER — Encounter: Payer: Self-pay | Admitting: Women's Health

## 2016-05-13 LAB — HEMOGLOBIN A1C
Hgb A1c MFr Bld: 5.8 % — ABNORMAL HIGH (ref <5.7)
Mean Plasma Glucose: 120 mg/dL

## 2016-05-15 ENCOUNTER — Encounter: Payer: Self-pay | Admitting: Internal Medicine

## 2016-05-15 ENCOUNTER — Ambulatory Visit (INDEPENDENT_AMBULATORY_CARE_PROVIDER_SITE_OTHER): Payer: BLUE CROSS/BLUE SHIELD | Admitting: Internal Medicine

## 2016-05-15 VITALS — BP 112/82 | HR 77 | Temp 98.3°F | Ht 66.0 in | Wt 263.5 lb

## 2016-05-15 DIAGNOSIS — Z8709 Personal history of other diseases of the respiratory system: Secondary | ICD-10-CM

## 2016-05-15 DIAGNOSIS — R7302 Impaired glucose tolerance (oral): Secondary | ICD-10-CM | POA: Diagnosis not present

## 2016-05-15 DIAGNOSIS — Z23 Encounter for immunization: Secondary | ICD-10-CM

## 2016-05-15 DIAGNOSIS — E669 Obesity, unspecified: Secondary | ICD-10-CM | POA: Diagnosis not present

## 2016-05-15 DIAGNOSIS — K219 Gastro-esophageal reflux disease without esophagitis: Secondary | ICD-10-CM | POA: Diagnosis not present

## 2016-05-15 DIAGNOSIS — E039 Hypothyroidism, unspecified: Secondary | ICD-10-CM

## 2016-05-15 DIAGNOSIS — F411 Generalized anxiety disorder: Secondary | ICD-10-CM | POA: Diagnosis not present

## 2016-05-15 DIAGNOSIS — G4700 Insomnia, unspecified: Secondary | ICD-10-CM | POA: Diagnosis not present

## 2016-05-15 DIAGNOSIS — E8881 Metabolic syndrome: Secondary | ICD-10-CM | POA: Diagnosis not present

## 2016-05-15 DIAGNOSIS — Z8669 Personal history of other diseases of the nervous system and sense organs: Secondary | ICD-10-CM | POA: Diagnosis not present

## 2016-05-15 DIAGNOSIS — E785 Hyperlipidemia, unspecified: Secondary | ICD-10-CM

## 2016-05-17 ENCOUNTER — Other Ambulatory Visit: Payer: Self-pay | Admitting: Internal Medicine

## 2016-05-22 ENCOUNTER — Other Ambulatory Visit: Payer: Self-pay | Admitting: Women's Health

## 2016-05-22 DIAGNOSIS — R232 Flushing: Secondary | ICD-10-CM

## 2016-05-25 ENCOUNTER — Telehealth: Payer: Self-pay | Admitting: *Deleted

## 2016-05-25 MED ORDER — VENLAFAXINE HCL 37.5 MG PO TABS: 37.5000 mg | ORAL_TABLET | Freq: Every day | ORAL | 3 refills | Status: DC

## 2016-05-25 MED ORDER — VENLAFAXINE HCL 37.5 MG PO TABS: 37.5000 mg | ORAL_TABLET | Freq: Two times a day (BID) | ORAL | 3 refills | Status: DC

## 2016-05-25 NOTE — Telephone Encounter (Signed)
Pt is willing to try the lowest dose 37.5 mg Rx sent

## 2016-05-25 NOTE — Telephone Encounter (Signed)
Pt called requesting to start on Effexor for hot flashes, had Rx never started medication was nervous about taking Rx. Pt is wanting to start medication but worried about taking this "anti- depression" I explained can be prescribed as HRT as well. Pt doesn't want medication to put her in a "weird mood". She just wants confirmation that this is a safe medication. Please advise

## 2016-05-25 NOTE — Telephone Encounter (Signed)
We could start with the lowest dose 37.5 and if no relief could try 75mg  which is still a low dose. It may help with the hot flushes and sleep which also helps with the mood.

## 2016-06-02 NOTE — Patient Instructions (Signed)
It was a pleasure to see you today. Keep up the good work with glucose management. Continue same dose of thyroid replacement. Return in 6 months for physical examination. Flu vaccine given today.

## 2016-06-02 NOTE — Progress Notes (Signed)
   Subjective:    Patient ID: Mckenzie Hebert, female    DOB: Jan 14, 1956, 60 y.o.   MRN: [id number]  HPI 60 year old Female in today for six-month recheck of essential hypertension, hypothyroidism, obesity, asthma. Also has GE reflux, history of migraine headaches and insomnia. History of vitamin D deficiency. History of hyperlipidemia treated with Zocor. Impaired glucose tolerance treated with metformin. Takes Xanax for anxiety and insomnia. Has albuterol and Advair inhalers for asthma. Was prescribed Effexor for hot flashes but did not want to take it.  In March hemoglobin A1c was excellent at 6% and had previously been 6.3%. Most recently hemoglobin A1c 5.8%. Lipid panel normal.  In June had  plantar fibroma removed from left foot by Dr. Gladstone Lighter.  Patient is a widow, lost has been to heart issues in 2008. and resides alone with dog. 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  She is working with a Civil Service fast streamer for exercise.  Had DEXA scan 2016 which was normal.  Benign colon polyp on colonoscopy in 2007.  She is menopausal and sees Elon Alas, nurse practitioner in Dr. Zelphia Cairo office for GYN exam. Had exam 03/11/2016.  Saw cardiologist February 2017 for Myoview stress test which was normal. At that time she was having chest pain. Episode of near syncope January 2017.    Review of Systems as above     Objective:   Physical Exam  Neck supple without JVD thyromegaly or carotid bruits. Chest clear. Cardiac exam regular rate and rhythm normal S1 and S2. Extremities without edema. Affect is normal.      Assessment & Plan:  Hypothyroidism-TSH is within normal limits on thyroid replacement  Hyperlipidemia-lipid panel normal on statin medication  Impaired glucose tolerance-hemoglobin A1c excellent at 5.8%  Plan: Continued working with trainer for exercise. Continue same medications and return in 6 months for physical examination.

## 2016-06-09 ENCOUNTER — Other Ambulatory Visit: Payer: Self-pay | Admitting: Internal Medicine

## 2016-06-10 ENCOUNTER — Other Ambulatory Visit: Payer: Self-pay

## 2016-06-10 MED ORDER — VENLAFAXINE HCL 37.5 MG PO TABS: 37.5000 mg | ORAL_TABLET | Freq: Every day | ORAL | 12 refills | Status: DC

## 2016-06-10 NOTE — Telephone Encounter (Signed)
Telephone call, states already has a prescription and is been on the medicine for 2 weeks, states does not need a refill at this time. States is having less hot flushes but continues with problems with sleep.

## 2016-06-11 ENCOUNTER — Other Ambulatory Visit: Payer: Self-pay | Admitting: *Deleted

## 2016-06-11 ENCOUNTER — Telehealth: Payer: Self-pay | Admitting: Internal Medicine

## 2016-06-11 MED ORDER — CYCLOBENZAPRINE HCL 10 MG PO TABS: 10.0000 mg | ORAL_TABLET | Freq: Three times a day (TID) | ORAL | 0 refills | Status: DC | PRN

## 2016-06-11 NOTE — Telephone Encounter (Signed)
Patient called with complaints of neck/back pain.  States that she has a knot in her back.  She was sitting and her dog barked and it scared her and she jumped.  And, she has a knot in her neck/back area.  She has been taking 800mg  of Ibuprofen for 2 days with no relief and putting ice and heat alternating to this area but it has not helped.    Spoke with Dr. Renold Genta; verbal order for Flexeril 10mg , #30.  Take 1 by mouth three times daily, no refill.    Advised patient that this Rx will be sent to her pharmacy today.    [facility] [address].

## 2016-06-11 NOTE — Telephone Encounter (Signed)
Done

## 2016-06-12 ENCOUNTER — Telehealth: Payer: Self-pay | Admitting: Physician Assistant

## 2016-06-12 NOTE — Telephone Encounter (Signed)
LVM asking pt to call us and schedule her 6 month f/u per ckout on 10/07/15

## 2016-07-06 LAB — HM COLONOSCOPY

## 2016-07-15 ENCOUNTER — Telehealth: Payer: Self-pay | Admitting: *Deleted

## 2016-07-15 NOTE — Telephone Encounter (Signed)
Pt takes Effexor 37.5 mg tablets daily for hot flashes, takes in the am, and during the date she is very tired, c/o no energy. Asked you thought taking medication at night would be better? Please advise

## 2016-07-15 NOTE — Telephone Encounter (Signed)
Yes have her try taking it at bedtime, have her call if continues fatigue.

## 2016-07-16 NOTE — Telephone Encounter (Signed)
Left detailed message per DPR access.  

## 2016-08-11 ENCOUNTER — Ambulatory Visit: Payer: BLUE CROSS/BLUE SHIELD | Admitting: Cardiology

## 2016-08-25 ENCOUNTER — Ambulatory Visit (INDEPENDENT_AMBULATORY_CARE_PROVIDER_SITE_OTHER): Payer: BLUE CROSS/BLUE SHIELD | Admitting: Internal Medicine

## 2016-08-25 ENCOUNTER — Encounter: Payer: Self-pay | Admitting: Internal Medicine

## 2016-08-25 VITALS — BP 132/80 | HR 91 | Temp 98.8°F | Ht 66.0 in | Wt 262.0 lb

## 2016-08-25 DIAGNOSIS — K219 Gastro-esophageal reflux disease without esophagitis: Secondary | ICD-10-CM | POA: Diagnosis not present

## 2016-08-25 DIAGNOSIS — Z8709 Personal history of other diseases of the respiratory system: Secondary | ICD-10-CM

## 2016-08-25 DIAGNOSIS — J069 Acute upper respiratory infection, unspecified: Secondary | ICD-10-CM

## 2016-08-25 NOTE — Progress Notes (Signed)
   Subjective:    Patient ID: Mckenzie Hebert, female    DOB: [birth year], 60 y.o.   MRN: [id number]  HPI  In  early October was treated at [facility] with Augmentin. Has continued with cough. Insurance will no longer pay for Nexium. Is on Protonix. Longstanding history of GERD. Has HSV 1 right lower lip onset last evening.Bringing up tan sputum. No fever. Hacking cough. Has Hycodan for cough.Has noted some wheezing. History of asthma. Has inhalers.    Review of Systems as above     Objective:   Physical Exam Skin warm and dry. Nodes none. TMs are clear. Pharynx is only slightly injected. Lesion consistent with herpes simplex type I right lower lip. Neck supple. Chest: clear to auscultation without rales or wheezing. Has some expiratory wheezing( upper airway) with forced expiration       Assessment & Plan:  Protracted URI  History of asthma  GE reflux-We will try to get Nexium approved instead of Protonix.  Plan: Tessalon Perles 100 mg 3 times daily as needed for cough. May take Hycodan at night. Levaquin 500 milligrams daily for 10 days with a meal. Sterapred DS 10 mg 6 day Dosepak.

## 2016-08-26 ENCOUNTER — Telehealth: Payer: Self-pay | Admitting: Internal Medicine

## 2016-08-26 ENCOUNTER — Other Ambulatory Visit: Payer: Self-pay | Admitting: Internal Medicine

## 2016-08-26 MED ORDER — PREDNISONE 10 MG PO TABS: ORAL_TABLET | ORAL | 1 refills | Status: DC

## 2016-08-26 MED ORDER — LEVOFLOXACIN 500 MG PO TABS: 500.0000 mg | ORAL_TABLET | Freq: Every day | ORAL | 0 refills | Status: DC

## 2016-08-26 MED ORDER — BENZONATATE 100 MG PO CAPS: 100.0000 mg | ORAL_CAPSULE | Freq: Three times a day (TID) | ORAL | 2 refills | Status: DC

## 2016-08-26 NOTE — Telephone Encounter (Signed)
[facility] called [facility]25/2016.  She says she would also like something for daytime cough.

## 2016-08-26 NOTE — Telephone Encounter (Signed)
Refill x 6 months 

## 2016-08-26 NOTE — Patient Instructions (Signed)
Sterapred DS 10 mg 6 day dosepak. Levaquin 500 milligrams daily for 10 days. Use inhalers as directed. Tessalon Perles 100 mg 3 times daily as needed for cough. Hycodan 1 teaspoon at bedtime as needed for cough. We will try to get Nexium approved instead of Protonix for reflux. This seemed to work better.

## 2016-08-26 NOTE — Telephone Encounter (Signed)
Will do!

## 2016-08-27 ENCOUNTER — Other Ambulatory Visit: Payer: Self-pay | Admitting: Internal Medicine

## 2016-08-27 MED ORDER — ESOMEPRAZOLE MAGNESIUM 40 MG PO CPDR: 40.0000 mg | DELAYED_RELEASE_CAPSULE | Freq: Every day | ORAL | 3 refills | Status: DC

## 2016-09-09 NOTE — Progress Notes (Deleted)
Cardiology Office Note   Date:  09/09/2016   ID:  Mckenzie Hebert, DOB 1956-04-10, MRN [id number]  PCP:  Elby Showers, MD  Cardiologist:   Minus Breeding, MD  Referring:  ***  No chief complaint on file.     History of Present Illness: Mckenzie Hebert is a 61 y.o. female who presents for ***.  I saw her last in 2015.  She has had a history of chest pain and palpitations.  She was seen last year and had these symptoms.  She had a negative stress perfusion study.  ***  Mckenzie Hebert is a 61 y.o. obese female  With a history of tachypalpitations, PVCs,   diabetes mellitus,  Migraines , hypothyroidism.  Patient was recently seen by Dr. Renold Genta because of dizziness and weakness.  Ms. Rombach checked her blood sugar was 124.   She also reported on the 15th she had substernal chest pressure radiating to her right shoulder which she thought was asked reflux.  She hasn't had a problem in the last couple weeks however in the office here she saying she has some mild chest pain.  She denies any associated symptoms with it. Her blood pressure today is mildly elevated however Dr. Verlene Mayer office it was well controlled. Patient is seeing a registered dietitian to help with weight loss. She is lost 17 pounds since September. Her mom did have coronary bypass grafting at age 59.   She is usually quite active does all her own yard work. He has not had any further dizziness  The patient currently denies nausea, vomiting, fever, chest pain, shortness of breath, orthopnea, PND, cough, congestion, abdominal pain, hematochezia, melena, lower extremity edema, claudication.   Past Medical History:  Diagnosis Date  . AC (acromioclavicular) joint bone spurs 2008   right foot  . Bronchitis   . Colon polyps    Dr Collene Mares  . Endometrial polyp   . Migraines    Dr Catalina Gravel  . Uterine fibroid     Past Surgical History:  Procedure Laterality Date  . APPENDECTOMY    . FOOT SURGERY  2002, 2008   bi-lat , bone  spurs, achilles tendon work  . HYSTEROSCOPY     endo polyp and myoma  . KNEE SURGERY  2006,2008   arthroscopic, left X 2  . RESECTION TUMOR CLAVICLE RADICAL  1985   benign  . TONSILLECTOMY       Current Outpatient Prescriptions  Medication Sig Dispense Refill  . ALPRAZolam (XANAX) 1 MG tablet TAKE 1 TABLET BY MOUTH AT BEDTIME 30 tablet 5  . benzonatate (TESSALON) 100 MG capsule Take 1 capsule (100 mg total) by mouth 3 (three) times daily. 30 capsule 2  . Cholecalciferol (VITAMIN D3) 1000 UNITS CAPS Take 2,000 Int'l Units by mouth.     . cyclobenzaprine (FLEXERIL) 10 MG tablet Take 1 tablet (10 mg total) by mouth 3 (three) times daily as needed. 30 tablet 0  . esomeprazole (NEXIUM) 40 MG capsule Take 1 capsule (40 mg total) by mouth daily. 90 capsule 3  . Fluticasone-Salmeterol (ADVAIR DISKUS) 250-50 MCG/DOSE AEPB Inhale 1 puff into the lungs 2 (two) times daily at 10 AM and 5 PM. 17 each 5  . levofloxacin (LEVAQUIN) 500 MG tablet Take 1 tablet (500 mg total) by mouth daily. 10 tablet 0  . levothyroxine (SYNTHROID, LEVOTHROID) 125 MCG tablet TAKE 1 TABLET (125 MCG TOTAL) BY MOUTH DAILY. 30 tablet 5  . metFORMIN (GLUCOPHAGE) 500 MG tablet Take  500 mg by mouth at bedtime.    . pantoprazole (PROTONIX) 40 MG tablet Take 1 tablet (40 mg total) by mouth daily. 30 tablet 11  . predniSONE (DELTASONE) 10 MG tablet Tapering course as directed [id number] 21 tablet 1  . PROAIR HFA 108 (90 Base) MCG/ACT inhaler INHALE 2 SPRAYS BY MOUTH 4 TIMES A DAY AS NEEDED 17 Inhaler 5  . simvastatin (ZOCOR) 10 MG tablet TAKE 1 TABLET BY MOUTH AT BEDTIME 90 tablet 3  . venlafaxine (EFFEXOR) 37.5 MG tablet Take 1 tablet (37.5 mg total) by mouth daily. 30 tablet 12   No current facility-administered medications for this visit.     Allergies:   Patient has no known allergies.    Social History:  The patient  reports that she has quit smoking. She has never used smokeless tobacco. She reports that she does not drink  alcohol or use drugs.   Family History:  The patient's ***family history includes Autoimmune disease in her sister; Breast cancer in her paternal aunt; Cancer in her father; Diabetes in her mother and sister; Heart disease (age of onset: 71) in her mother; Hypertension in her mother, sister, and sister; Leukemia in her paternal grandmother; Mental retardation in her other; Thyroid disease in her sister.    ROS:  Please see the history of present illness.   Otherwise, review of systems are positive for {NONE DEFAULTED:18576::"none"}.   All other systems are reviewed and negative.    PHYSICAL EXAM: VS:  LMP 03/18/2008  , BMI There is no height or weight on file to calculate BMI. GENERAL:  Well appearing HEENT:  Pupils equal round and reactive, fundi not visualized, oral mucosa unremarkable NECK:  No jugular venous distention, waveform within normal limits, carotid upstroke brisk and symmetric, no bruits, no thyromegaly LYMPHATICS:  No cervical, inguinal adenopathy LUNGS:  Clear to auscultation bilaterally BACK:  No CVA tenderness CHEST:  Unremarkable HEART:  PMI not displaced or sustained,S1 and S2 within normal limits, no S3, no S4, no clicks, no rubs, *** murmurs ABD:  Flat, positive bowel sounds normal in frequency in pitch, no bruits, no rebound, no guarding, no midline pulsatile mass, no hepatomegaly, no splenomegaly EXT:  2 plus pulses throughout, no edema, no cyanosis no clubbing SKIN:  No rashes no nodules NEURO:  Cranial nerves II through XII grossly intact, motor grossly intact throughout PSYCH:  Cognitively intact, oriented to person place and time    EKG:  EKG {ACTION; IS/IS GI:[id number] ordered today. The ekg ordered today demonstrates ***   Recent Labs: 09/24/2015: BUN 12; Creat 0.57; Hemoglobin 12.6; Platelets 304; Potassium 4.1; Sodium 137 11/08/2015: ALT 22 05/12/2016: TSH 1.63    Lipid Panel    Component Value Date/Time   CHOL 160 05/12/2016 0933   TRIG 87  05/12/2016 0933   HDL 56 05/12/2016 0933   CHOLHDL 2.9 05/12/2016 0933   VLDL 17 05/12/2016 0933   LDLCALC 87 05/12/2016 0933      Wt Readings from Last 3 Encounters:  08/25/16 262 lb (118.8 kg)  05/15/16 263 lb 8 oz (119.5 kg)  04/10/16 266 lb (120.7 kg)      Other studies Reviewed: Additional studies/ records that were reviewed today include: ***. Review of the above records demonstrates:  Please see elsewhere in the note.  ***   ASSESSMENT AND PLAN:  *** CHEST PAIN:  PALPITATIONS:   Current medicines are reviewed at length with the patient today.  The patient {ACTIONS; HAS/DOES NOT HAVE:19233} concerns regarding medicines.  The following changes have been made:  {PLAN; NO CHANGE:13088:s}  Labs/ tests ordered today include: *** No orders of the defined types were placed in this encounter.    Disposition:   FU with ***    Signed, Minus Breeding, MD  09/09/2016 11:23 AM    [facility]

## 2016-09-10 ENCOUNTER — Ambulatory Visit: Payer: BLUE CROSS/BLUE SHIELD | Admitting: Cardiology

## 2016-09-21 ENCOUNTER — Ambulatory Visit (INDEPENDENT_AMBULATORY_CARE_PROVIDER_SITE_OTHER): Payer: BLUE CROSS/BLUE SHIELD | Admitting: Physician Assistant

## 2016-09-21 ENCOUNTER — Encounter: Payer: Self-pay | Admitting: Physician Assistant

## 2016-09-21 VITALS — BP 141/89 | HR 75 | Ht 67.5 in | Wt 278.2 lb

## 2016-09-21 DIAGNOSIS — R030 Elevated blood-pressure reading, without diagnosis of hypertension: Secondary | ICD-10-CM | POA: Diagnosis not present

## 2016-09-21 DIAGNOSIS — R002 Palpitations: Secondary | ICD-10-CM

## 2016-09-21 NOTE — Progress Notes (Signed)
Cardiology Office Note   Date:  09/21/2016   ID:  Mckenzie Hebert, DOB 1956-07-03, MRN [id number]  PCP:  Elby Showers, MD  Cardiologist:  Dr. Mardene Celeste, PA-C   Chief Complaint  Patient presents with  . Follow-up    6 months; Pt states no Sx.     History of Present Illness: Mckenzie Hebert is a 61 y.o. female with a history of DM, PVCs, palpitations, migraines, hypothyroid, FH CAD.  Mckenzie Hebert presents for follow up.   She has generally been doing pretty well. She works long hours and gets up very early. Because of this, she occasionally drinks more caffeine than she should. She is trying to go to the gym regularly.   She does not feel her heart skip. 2-3 x month, she will feel her heart go fast. This is generally after a busy day or when she is rushing around. Sometimes she is on 3rd shift. She cannot sleep when she works 3rd shift.   When she is rushing around, she knows her HR/BP is up. She feels that she does not have high BP otherwise.  Since her husband's death 12-15-2006, she has struggled with her weight. Other family members have also have died. She is an Geographical information systems officer, has gained a great deal of weight. She is trying to do better. She is working with a Engineer, maintenance (IT).  She still has reflux at times. Protonix did not do as well as Nexium, so she stopped it. She cannot afford prescription strength Nexium. She has not tried the OTC strength.   Past Medical History:  Diagnosis Date  . AC (acromioclavicular) joint bone spurs 2006/12/15   right foot  . Bronchitis   . Colon polyps    Dr Collene Mares  . Endometrial polyp   . Migraines    Dr Catalina Gravel  . Uterine fibroid     Past Surgical History:  Procedure Laterality Date  . APPENDECTOMY    . FOOT SURGERY  2002, 2008   bi-lat , bone spurs, achilles tendon work  . HYSTEROSCOPY     endo polyp and myoma  . KNEE SURGERY  12-14-2004   arthroscopic, left X 2  . RESECTION TUMOR CLAVICLE RADICAL  1985   benign  .  TONSILLECTOMY      Current Outpatient Prescriptions  Medication Sig Dispense Refill  . ALPRAZolam (XANAX) 1 MG tablet TAKE 1 TABLET BY MOUTH AT BEDTIME 30 tablet 5  . Cholecalciferol (VITAMIN D3) 1000 UNITS CAPS Take 2,000 Int'l Units by mouth.     . cyclobenzaprine (FLEXERIL) 10 MG tablet Take 1 tablet (10 mg total) by mouth 3 (three) times daily as needed. 30 tablet 0  . Fluticasone-Salmeterol (ADVAIR DISKUS) 250-50 MCG/DOSE AEPB Inhale 1 puff into the lungs 2 (two) times daily at 10 AM and 5 PM. 17 each 5  . levothyroxine (SYNTHROID, LEVOTHROID) 125 MCG tablet TAKE 1 TABLET (125 MCG TOTAL) BY MOUTH DAILY. 30 tablet 5  . metFORMIN (GLUCOPHAGE) 500 MG tablet Take 500 mg by mouth at bedtime.    . predniSONE (DELTASONE) 10 MG tablet Tapering course as directed [id number] 21 tablet 1  . PROAIR HFA 108 (90 Base) MCG/ACT inhaler INHALE 2 SPRAYS BY MOUTH 4 TIMES A DAY AS NEEDED 17 Inhaler 5  . simvastatin (ZOCOR) 10 MG tablet TAKE 1 TABLET BY MOUTH AT BEDTIME 90 tablet 3  . venlafaxine (EFFEXOR) 37.5 MG tablet Take 1 tablet (37.5 mg total) by mouth daily. 30 tablet  12   No current facility-administered medications for this visit.     Allergies:   Patient has no known allergies.    Social History:  The patient  reports that she has quit smoking. She has never used smokeless tobacco. She reports that she does not drink alcohol or use drugs.   Family History:  The patient's family history includes Autoimmune disease in her sister; Breast cancer in her paternal aunt; Cancer in her father; Diabetes in her mother and sister; Heart disease (age of onset: 36) in her mother; Hypertension in her mother, sister, and sister; Leukemia in her paternal grandmother; Mental retardation in her other; Thyroid disease in her sister.    ROS:  Please see the history of present illness. All other systems are reviewed and negative.    PHYSICAL EXAM: VS:  BP (!) 141/89   Pulse 75   Ht 5' 7.5" (1.715 m)   Wt 278 lb  3.2 oz (126.2 kg)   LMP 03/18/2008   BMI 42.93 kg/m  , BMI Body mass index is 42.93 kg/m. GEN: Well nourished, well developed, female in no acute distress  HEENT: normal for age  Neck: no JVD, no carotid bruit, no masses Cardiac: RRR; 1-2/6 murmur, no rubs, or gallops Respiratory:  clear to auscultation bilaterally, normal work of breathing GI: soft, nontender, nondistended, + BS MS: no deformity or atrophy; no edema; distal pulses are 2+ in all 4 extremities   Skin: warm and dry, no rash Neuro:  Strength and sensation are intact Psych: euthymic mood, full affect   EKG:  EKG is ordered today. The ekg ordered today demonstrates SR, HR 75, PVCs  MYOVIEW 10/10/2015  The left ventricular ejection fraction is normal (55-65%).  Nuclear stress EF: 64%.  There was no ST segment deviation noted during stress.  No T wave inversion was noted during stress.  The study is normal.  This is a low risk study.  Low risk stress nuclear study with normal perfusion and normal left ventricular regional and global systolic function.   Recent Labs: 09/24/2015: BUN 12; Creat 0.57; Hemoglobin 12.6; Platelets 304; Potassium 4.1; Sodium 137 11/08/2015: ALT 22 05/12/2016: TSH 1.63    Lipid Panel    Component Value Date/Time   CHOL 160 05/12/2016 0933   TRIG 87 05/12/2016 0933   HDL 56 05/12/2016 0933   CHOLHDL 2.9 05/12/2016 0933   VLDL 17 05/12/2016 0933   LDLCALC 87 05/12/2016 0933     Wt Readings from Last 3 Encounters:  09/21/16 278 lb 3.2 oz (126.2 kg)  08/25/16 262 lb (118.8 kg)  05/15/16 263 lb 8 oz (119.5 kg)     Other studies Reviewed: Additional studies/ records that were reviewed today include: Office notes, hospital records and testing.  ASSESSMENT AND PLAN:  1.  Palpitations: pt has PACs on her ECG, but HR was regular on exam. She had just used her inhaler when she came in. She is asymptomatic with the PACs. No further evaluation needed.  2. BP issues: Pt BP was  elevated today, no change on recheck. Pt is requested to check her BP occasionally when she has a chance. Record it and f/u with Dr Renold Genta.  3. CP: Pt has not had CP, MV normal in the past, no further workup   Current medicines are reviewed at length with the patient today.  The patient does not have concerns regarding medicines.  The following changes have been made:  no change  Labs/ tests ordered today include:  No orders of the defined types were placed in this encounter.    Disposition:   FU with Dr Percival Spanish in 1 year  Signed, Lenoard Aden  09/21/2016 3:30 PM    [facility] Phone: [phone]; Fax: [phone]  This note was written with the assistance of speech recognition software. Please excuse any transcriptional errors.

## 2016-09-21 NOTE — Patient Instructions (Signed)
You can try OTC (over the counter) Nexium for reflux as needed  Your physician recommends that you continue on your current medications as directed. Please refer to the Current Medication list given to you today.  Your physician wants you to follow-up in: ONE YEAR with Dr. Percival Spanish. You will receive a reminder letter in the mail two months in advance. If you don't receive a letter, please call our office to schedule the follow-up appointment.

## 2016-11-03 ENCOUNTER — Encounter: Payer: Self-pay | Admitting: Internal Medicine

## 2016-11-06 ENCOUNTER — Telehealth: Payer: Self-pay | Admitting: Internal Medicine

## 2016-11-06 ENCOUNTER — Encounter: Payer: Self-pay | Admitting: Internal Medicine

## 2016-11-06 ENCOUNTER — Ambulatory Visit
Admission: RE | Admit: 2016-11-06 | Discharge: 2016-11-06 | Disposition: A | Discharge status: Home / Self Care | Payer: BLUE CROSS/BLUE SHIELD | Source: Ambulatory Visit | Attending: Internal Medicine | Admitting: Internal Medicine

## 2016-11-06 ENCOUNTER — Ambulatory Visit (INDEPENDENT_AMBULATORY_CARE_PROVIDER_SITE_OTHER): Payer: BLUE CROSS/BLUE SHIELD | Admitting: Internal Medicine

## 2016-11-06 VITALS — BP 160/98 | HR 81 | Temp 98.4°F | Wt 283.0 lb

## 2016-11-06 DIAGNOSIS — E119 Type 2 diabetes mellitus without complications: Secondary | ICD-10-CM

## 2016-11-06 DIAGNOSIS — R1031 Right lower quadrant pain: Secondary | ICD-10-CM

## 2016-11-06 DIAGNOSIS — R109 Unspecified abdominal pain: Secondary | ICD-10-CM

## 2016-11-06 LAB — POCT URINALYSIS DIPSTICK
Bilirubin, UA: NEGATIVE
Blood, UA: NEGATIVE
Glucose, UA: NEGATIVE
Ketones, UA: NEGATIVE
Leukocytes, UA: NEGATIVE
Nitrite, UA: NEGATIVE
Protein, UA: NEGATIVE
Spec Grav, UA: 1.01
Urobilinogen, UA: NEGATIVE
pH, UA: 6.5

## 2016-11-06 LAB — COMPREHENSIVE METABOLIC PANEL
ALT: 40 U/L — ABNORMAL HIGH (ref 6–29)
AST: 30 U/L (ref 10–35)
Albumin: 4.3 g/dL (ref 3.6–5.1)
Alkaline Phosphatase: 64 U/L (ref 33–130)
BUN: 18 mg/dL (ref 7–25)
CO2: 25 mmol/L (ref 20–31)
Calcium: 9.2 mg/dL (ref 8.6–10.4)
Chloride: 102 mmol/L (ref 98–110)
Creat: 0.74 mg/dL (ref 0.50–0.99)
Glucose, Bld: 86 mg/dL (ref 65–99)
Potassium: 4.1 mmol/L (ref 3.5–5.3)
Sodium: 138 mmol/L (ref 135–146)
Total Bilirubin: 0.5 mg/dL (ref 0.2–1.2)
Total Protein: 7.4 g/dL (ref 6.1–8.1)

## 2016-11-06 LAB — CBC WITH DIFFERENTIAL/PLATELET
Basophils Absolute: 0 cells/uL (ref 0–200)
Basophils Relative: 0 %
Eosinophils Absolute: 288 cells/uL (ref 15–500)
Eosinophils Relative: 4 %
HCT: 38.4 % (ref 35.0–45.0)
Hemoglobin: 12.5 g/dL (ref 11.7–15.5)
Lymphocytes Relative: 21 %
Lymphs Abs: 1512 cells/uL (ref 850–3900)
MCH: 26.2 pg — ABNORMAL LOW (ref 27.0–33.0)
MCHC: 32.6 g/dL (ref 32.0–36.0)
MCV: 80.5 fL (ref 80.0–100.0)
MPV: 9 fL (ref 7.5–12.5)
Monocytes Absolute: 504 cells/uL (ref 200–950)
Monocytes Relative: 7 %
Neutro Abs: 4896 cells/uL (ref 1500–7800)
Neutrophils Relative %: 68 %
Platelets: 298 10*3/uL (ref 140–400)
RBC: 4.77 MIL/uL (ref 3.80–5.10)
RDW: 14.8 % (ref 11.0–15.0)
WBC: 7.2 10*3/uL (ref 3.8–10.8)

## 2016-11-06 LAB — AMYLASE: Amylase: 23 U/L (ref 0–105)

## 2016-11-06 LAB — LIPASE: Lipase: 17 U/L (ref 7–60)

## 2016-11-06 MED ORDER — KETOCONAZOLE 2 % EX CREA: 1.0000 "application " | TOPICAL_CREAM | Freq: Every day | CUTANEOUS | 99 refills | Status: DC

## 2016-11-06 NOTE — Progress Notes (Signed)
   Subjective:    Patient ID: Mckenzie Hebert, female    DOB: [birth year], 61 y.o.   MRN: [id number]  HPI 60 year old with right lower quadrant abdominal pain persistent now for 3 days. Patient has had intermittent right lower quadrant pain for several weeks. She has a history of GE reflux. It is not well controlled with Protonix. Were going to see if we can get Nexium approved for her. We sent documentation recently to insurance company asking for them to approve Nexium.  Patient says that the right lower quadrant pain is intermittent. Not associated with constipation or diarrhea. She is concerned about a possible cyst on her ovary apparently was told this by GYN in the past. She had a recent colonoscopy by Dr. Collene Mares that was described as normal.    Review of Systems feels nauseated when she has reflux symptoms     Objective:   Physical Exam Skin warm and dry. Chest clear. Cardiac exam regular rate and rhythm. Abdomen is obese and nondistended. She is tender in her right upper quadrant and right lower quadrant. Seems to be most tender along her lower right rib cage area. No rebound tenderness is appreciated. Bimanual exam is normal.       Assessment & Plan:  GE reflux-severe not responding to Protonix  Right upper quadrant pain  Right lower quadrant pain  Plan: She is to have CT of the abdomen and pelvis today. CBC, C met amylase and lipase drawn.

## 2016-11-06 NOTE — Telephone Encounter (Signed)
Patient was seen in office today for RLQ Pain x 3 days.  Dr. Renold Genta ordered CT abdomen/pelvis with contrast.  Dx:  RLQ Pain (R10.31).  Spoke with Doylene Bode' @ [facility] [phone]; advised no PAC req!  Ref #[id number].  Per automated system @ [phone] for benefits; pol eff 09/07/16 and current.  X-ray benefits; $4500 OOP with $60 copay.  $210.17 met of this OOP.  Provided this information to patient.  Patient was sent to [facility] as a walk-in CT.    CT was called back with report and it was normal.  Patient was called by Dr. Renold Genta and advised that she is constipated.

## 2016-11-06 NOTE — Patient Instructions (Signed)
To have CT of abdomen and pelvis with contrast today. Lab work pending.

## 2016-11-07 LAB — HEMOGLOBIN A1C
Hgb A1c MFr Bld: 6.2 % — ABNORMAL HIGH (ref <5.7)
Mean Plasma Glucose: 131 mg/dL

## 2016-11-10 ENCOUNTER — Other Ambulatory Visit: Payer: Self-pay | Admitting: Internal Medicine

## 2016-11-10 DIAGNOSIS — E119 Type 2 diabetes mellitus without complications: Secondary | ICD-10-CM

## 2016-11-10 LAB — MICROALBUMIN / CREATININE URINE RATIO

## 2016-11-10 NOTE — Addendum Note (Signed)
Addended by: Inocencio Homes on: 11/10/2016 03:42 PM   Modules accepted: Orders

## 2016-11-11 LAB — MICROALBUMIN / CREATININE URINE RATIO
Creatinine, Urine: 59 mg/dL (ref 20–320)
Microalb, Ur: <0.2 mg/dL

## 2016-11-13 ENCOUNTER — Other Ambulatory Visit: Payer: Self-pay | Admitting: Internal Medicine

## 2016-11-14 ENCOUNTER — Other Ambulatory Visit: Payer: Self-pay | Admitting: Internal Medicine

## 2016-12-06 ENCOUNTER — Other Ambulatory Visit: Payer: Self-pay | Admitting: Internal Medicine

## 2016-12-07 NOTE — Telephone Encounter (Signed)
Verbal order by Dr. Renold Genta to refill Levothyroxine 125 mcg tablets #90 with 2 refills.  [facility] to [facility] @ [phone].

## 2016-12-11 ENCOUNTER — Telehealth: Payer: Self-pay | Admitting: *Deleted

## 2016-12-11 NOTE — Telephone Encounter (Signed)
This is just FYI pt called to relay to nancy that she stopped effexor 37.5 mg in feb 2018, started in sept 2017. Pt said she stopped because medication side effects such as dry cough, migraines and weight gain. Pt does not wish to take medication at this time.

## 2016-12-25 ENCOUNTER — Encounter: Payer: Self-pay | Admitting: Internal Medicine

## 2016-12-25 ENCOUNTER — Ambulatory Visit (INDEPENDENT_AMBULATORY_CARE_PROVIDER_SITE_OTHER): Payer: BLUE CROSS/BLUE SHIELD | Admitting: Internal Medicine

## 2016-12-25 VITALS — BP 120/80 | HR 86 | Temp 97.8°F | Wt 283.0 lb

## 2016-12-25 DIAGNOSIS — J9801 Acute bronchospasm: Secondary | ICD-10-CM

## 2016-12-25 DIAGNOSIS — J22 Unspecified acute lower respiratory infection: Secondary | ICD-10-CM

## 2016-12-25 DIAGNOSIS — K219 Gastro-esophageal reflux disease without esophagitis: Secondary | ICD-10-CM | POA: Diagnosis not present

## 2016-12-25 MED ORDER — PREDNISONE 10 MG PO TABS: ORAL_TABLET | ORAL | 0 refills | Status: DC

## 2016-12-25 MED ORDER — LEVOFLOXACIN 500 MG PO TABS: 500.0000 mg | ORAL_TABLET | Freq: Every day | ORAL | 0 refills | Status: DC

## 2016-12-25 MED ORDER — BENZONATATE 100 MG PO CAPS: 100.0000 mg | ORAL_CAPSULE | Freq: Three times a day (TID) | ORAL | 2 refills | Status: DC | PRN

## 2016-12-25 MED ORDER — HYDROCOD POLST-CPM POLST ER 10-8 MG/5ML PO SUER: 5.0000 mL | Freq: Two times a day (BID) | ORAL | 0 refills | Status: DC | PRN

## 2016-12-25 MED ORDER — PANTOPRAZOLE SODIUM 40 MG PO TBEC: 40.0000 mg | DELAYED_RELEASE_TABLET | Freq: Every day | ORAL | 3 refills | Status: DC

## 2016-12-25 NOTE — Progress Notes (Signed)
   Subjective:    Patient ID: Mckenzie Hebert, female    DOB: [birth year], 61 y.o.   MRN: [id number]  HPI 61 year old Female with several days of cough and congestion. She has a history of GE reflux. She is about to run out of Dexilant samples that I gave her previously. Her insurance, he will not approve this. It does seem to control reflux. She's had some wheezing. No documented fever or shaking chills. Some fatigue.    Review of Systems See above    Objective:   Physical Exam Skin warm and dry. Nodes none. TMs are clear. Pharynx is clear. Neck is supple.chest exam: Occasional inspiratory wheezing. No rales appreciated.       Assessment & Plan:  Acute bronchitis  Acute bronchospasm   History of GE reflux  Plan: Prednisone 10 mg ( #21) take in tapering course as directed.  Protonix 40 mg daily when runs out of Dexilant  Albuterol inhaler 2 sprays by mouth 4 times daily. Tussionex 1 teaspoon by mouth every 12 hours when necessary cough. Tessalon Perles 100 mg up to 3 times daily as needed for cough. She may take Tessalon Perles at work as these will not cause drowsiness. Rest and drink plenty of fluids.Levaquin 500 milligrams daily for 10 days

## 2016-12-29 ENCOUNTER — Telehealth: Payer: Self-pay | Admitting: *Deleted

## 2016-12-29 NOTE — Telephone Encounter (Signed)
Pt called left message in triage voicemail requesting to start on new anti-depression medication,I called pt back and asked her to schedule OV with nancy to discuss.

## 2016-12-30 NOTE — Patient Instructions (Addendum)
Use Symbicort in place of Advair inhaler 1 spray by mouth every 12 hours. Use albuterol inhaler 2 sprays 4 times daily. Take prednisone in tapering course as directed. Tussionex 1 teaspoon by mouth every 12 hours when necessary cough. Tessalon Perles 100 mg up to 3 times daily as needed for cough when at work. Levaquin 500 milligrams daily for 10 days

## 2017-01-05 ENCOUNTER — Encounter: Payer: Self-pay | Admitting: Internal Medicine

## 2017-01-05 ENCOUNTER — Ambulatory Visit (INDEPENDENT_AMBULATORY_CARE_PROVIDER_SITE_OTHER): Payer: BLUE CROSS/BLUE SHIELD | Admitting: Internal Medicine

## 2017-01-05 VITALS — BP 130/80 | HR 88 | Temp 98.0°F | Wt 282.0 lb

## 2017-01-05 DIAGNOSIS — F411 Generalized anxiety disorder: Secondary | ICD-10-CM

## 2017-01-05 DIAGNOSIS — K219 Gastro-esophageal reflux disease without esophagitis: Secondary | ICD-10-CM

## 2017-01-05 DIAGNOSIS — Z8659 Personal history of other mental and behavioral disorders: Secondary | ICD-10-CM | POA: Diagnosis not present

## 2017-01-05 DIAGNOSIS — Z8709 Personal history of other diseases of the respiratory system: Secondary | ICD-10-CM | POA: Diagnosis not present

## 2017-01-05 DIAGNOSIS — Z6841 Body Mass Index (BMI) 40.0 and over, adult: Secondary | ICD-10-CM

## 2017-01-05 DIAGNOSIS — E119 Type 2 diabetes mellitus without complications: Secondary | ICD-10-CM | POA: Diagnosis not present

## 2017-01-05 MED ORDER — NALTREXONE-BUPROPION HCL ER 8-90 MG PO TB12: ORAL_TABLET | ORAL | 0 refills | Status: DC

## 2017-01-06 ENCOUNTER — Telehealth: Payer: Self-pay | Admitting: Internal Medicine

## 2017-01-06 NOTE — Telephone Encounter (Signed)
Called patient to advise that we will be mailing her information r/t Dexilent Rx/coupon information.  She has to print the coupon information out and look on the computer herself.  Advised that I will drop the information in the mail to her on Thursday, 01/07/17.  Will ask Dr. Renold Genta if she needs Rx for Dexilant?  Not sure about that.  Patient not sure about that either.  Will clarify and then drop this coupon information in the mail to patient per Dr. Verlene Mayer verbal order.  Patient understands our conversation and is happy to learn of this information.  She will call about this when she received it.  She will look for it at the end of this week.

## 2017-01-07 NOTE — Telephone Encounter (Signed)
Prescription written for patient for the Dexilant Rx and included with the coupon information and mailed to the patient today.

## 2017-01-11 ENCOUNTER — Ambulatory Visit: Payer: BLUE CROSS/BLUE SHIELD | Admitting: Women's Health

## 2017-01-20 ENCOUNTER — Encounter: Payer: Self-pay | Admitting: Gynecology

## 2017-02-01 NOTE — Patient Instructions (Signed)
Try Contrave 8-90 twice daily and follow-up in 4-6 weeks

## 2017-02-01 NOTE — Progress Notes (Signed)
   Subjective:    Patient ID: Mckenzie Hebert, female    DOB: 22-Oct-1955, 61 y.o.   MRN: [id number]  HPI 61 year old Female in today to discuss treatment for obesity. Her BMI is 43.52. Patient has tried multiple types of diets over the years. Some more successful but she eventually regained the weight. Her exercise consists of walking her dogs. She tries to watch her diet. She has a history of impaired glucose tolerance.  History of fairly severe GE reflux. We have tried to get her approved for Dexilant but insurance would not approve. She does better with Dexilant  than with other PPIs.  Someone told her about Contrave and she's interested in trying that.  She does have depression related to living alone and losing her   husband several years ago.   Review of Systems has fatigue especially after work. 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     Objective:   Physical Exam  not examined spent 20 minutes speaking with her about treatment of obesity. Have spoken with her about obesity physician Dr. Dennard Nip being in town nail. She may want to consider a visit with her. We will try to get Contrave approved and follow-up with her in 4-6 weeks        Assessment & Plan:  BMI 43.52  GE reflux  Anxiety depression  Plan: Trial of Contrave 8-90 one by mouth twice daily and follow-up in 4-6 weeks

## 2017-04-05 ENCOUNTER — Other Ambulatory Visit: Payer: Self-pay | Admitting: Internal Medicine

## 2017-04-05 NOTE — Telephone Encounter (Signed)
Refill x 6 months 

## 2017-04-19 DIAGNOSIS — M1712 Unilateral primary osteoarthritis, left knee: Secondary | ICD-10-CM | POA: Diagnosis not present

## 2017-04-19 DIAGNOSIS — M1711 Unilateral primary osteoarthritis, right knee: Secondary | ICD-10-CM | POA: Diagnosis not present

## 2017-04-19 DIAGNOSIS — M170 Bilateral primary osteoarthritis of knee: Secondary | ICD-10-CM | POA: Diagnosis not present

## 2017-04-20 ENCOUNTER — Other Ambulatory Visit: Payer: Self-pay | Admitting: Internal Medicine

## 2017-04-20 DIAGNOSIS — Z1231 Encounter for screening mammogram for malignant neoplasm of breast: Secondary | ICD-10-CM

## 2017-05-13 ENCOUNTER — Encounter: Payer: Self-pay | Admitting: Internal Medicine

## 2017-05-13 ENCOUNTER — Other Ambulatory Visit: Payer: BLUE CROSS/BLUE SHIELD | Admitting: Internal Medicine

## 2017-05-13 ENCOUNTER — Ambulatory Visit (INDEPENDENT_AMBULATORY_CARE_PROVIDER_SITE_OTHER): Payer: BLUE CROSS/BLUE SHIELD | Admitting: Internal Medicine

## 2017-05-13 VITALS — BP 130/72 | HR 83 | Temp 99.1°F | Wt 266.0 lb

## 2017-05-13 DIAGNOSIS — E8881 Metabolic syndrome: Secondary | ICD-10-CM | POA: Diagnosis not present

## 2017-05-13 DIAGNOSIS — F419 Anxiety disorder, unspecified: Secondary | ICD-10-CM | POA: Diagnosis not present

## 2017-05-13 DIAGNOSIS — E119 Type 2 diabetes mellitus without complications: Secondary | ICD-10-CM

## 2017-05-13 DIAGNOSIS — G4709 Other insomnia: Secondary | ICD-10-CM

## 2017-05-13 DIAGNOSIS — R7302 Impaired glucose tolerance (oral): Secondary | ICD-10-CM

## 2017-05-13 DIAGNOSIS — E7849 Other hyperlipidemia: Secondary | ICD-10-CM

## 2017-05-13 DIAGNOSIS — Z23 Encounter for immunization: Secondary | ICD-10-CM | POA: Diagnosis not present

## 2017-05-13 DIAGNOSIS — Z6841 Body Mass Index (BMI) 40.0 and over, adult: Secondary | ICD-10-CM

## 2017-05-13 DIAGNOSIS — E039 Hypothyroidism, unspecified: Secondary | ICD-10-CM | POA: Diagnosis not present

## 2017-05-13 DIAGNOSIS — Z8639 Personal history of other endocrine, nutritional and metabolic disease: Secondary | ICD-10-CM

## 2017-05-13 DIAGNOSIS — K219 Gastro-esophageal reflux disease without esophagitis: Secondary | ICD-10-CM

## 2017-05-13 DIAGNOSIS — E784 Other hyperlipidemia: Secondary | ICD-10-CM

## 2017-05-13 NOTE — Progress Notes (Signed)
   Subjective:    Patient ID: Mckenzie Hebert, female    DOB: 06-Apr-1956, 61 y.o.   MRN: [id number]  HPI She is here to follow-up on Contrave therapy. At last visit weighed 282 pounds and now weighs 266 pounds.He had lab work drawn this morning and results are pending.History of impaired glucose tolerance treated with metformin. history of hyperlipidemia treated with Zocor. History of anxiety treated with Xanax. History of asthma treated with inhalers. History of GE reflux. Dexilant  worked better for her but she could not afford it and insurance would not cover it. She's now on Protonix.    Review of Systems See above    Objective:   Physical Exam Skin warm and dry. Nodes none. No thyromegaly. Chest clear. Cardiac exam regular rate and rhythm normal S1 and S2. Extremities without edema.       Assessment & Plan:  Hyperlipidemia-she is on statin medication. Total cholesterol is 209 with triglycerides of 151 and LDL cholesterol of 132. Continue diet and exercise efforts.  Continue Contrave for obesity. BMI is 41.05  Vitamin D level is low normal at 30. Take 2000 units 5 and a 3 daily.  Impaired glucose tolerance. Fasting glucose is normal at 90. Hemoglobin A1c is 6.2%. Continue metformin.  Hypothyroidism-TSH is normal on current dose of thyroid replacement  Plan: Patient will schedule a physical exam in the near future but will continue with same medications and continue with Contrave therapy for obesity

## 2017-05-14 ENCOUNTER — Ambulatory Visit
Admission: RE | Admit: 2017-05-14 | Discharge: 2017-05-14 | Disposition: A | Discharge status: Home / Self Care | Payer: BLUE CROSS/BLUE SHIELD | Source: Ambulatory Visit | Attending: Internal Medicine | Admitting: Internal Medicine

## 2017-05-14 DIAGNOSIS — Z1231 Encounter for screening mammogram for malignant neoplasm of breast: Secondary | ICD-10-CM

## 2017-05-15 LAB — CBC WITH DIFFERENTIAL/PLATELET
Basophils Absolute: 50 cells/uL (ref 0–200)
Basophils Relative: 0.7 %
Eosinophils Absolute: 209 cells/uL (ref 15–500)
Eosinophils Relative: 2.9 %
HCT: 42.1 % (ref 35.0–45.0)
Hemoglobin: 13.6 g/dL (ref 11.7–15.5)
Lymphs Abs: 1872 cells/uL (ref 850–3900)
MCH: 26.2 pg — ABNORMAL LOW (ref 27.0–33.0)
MCHC: 32.3 g/dL (ref 32.0–36.0)
MCV: 81 fL (ref 80.0–100.0)
MPV: 10.6 fL (ref 7.5–12.5)
Monocytes Relative: 6.4 %
Neutro Abs: 4608 cells/uL (ref 1500–7800)
Neutrophils Relative %: 64 %
Platelets: 348 10*3/uL (ref 140–400)
RBC: 5.2 10*6/uL — ABNORMAL HIGH (ref 3.80–5.10)
RDW: 14.4 % (ref 11.0–15.0)
Total Lymphocyte: 26 %
WBC mixed population: 461 cells/uL (ref 200–950)
WBC: 7.2 10*3/uL (ref 3.8–10.8)

## 2017-05-15 LAB — MICROALBUMIN / CREATININE URINE RATIO
Creatinine, Urine: 103 mg/dL (ref 20–320)
Microalb Creat Ratio: 3 mcg/mg creat (ref <30)
Microalb, Ur: 0.3 mg/dL

## 2017-05-15 LAB — COMPLETE METABOLIC PANEL WITH GFR
AG Ratio: 1.5 (calc) (ref 1.0–2.5)
ALT: 28 U/L (ref 6–29)
AST: 26 U/L (ref 10–35)
Albumin: 4.5 g/dL (ref 3.6–5.1)
Alkaline phosphatase (APISO): 75 U/L (ref 33–130)
BUN: 11 mg/dL (ref 7–25)
CO2: 21 mmol/L (ref 20–32)
Calcium: 9.7 mg/dL (ref 8.6–10.4)
Chloride: 102 mmol/L (ref 98–110)
Creat: 0.7 mg/dL (ref 0.50–0.99)
GFR, Est African American: 108 mL/min/{1.73_m2} (ref >=60)
GFR, Est Non African American: 93 mL/min/{1.73_m2} (ref >=60)
Globulin: 3 g/dL (calc) (ref 1.9–3.7)
Glucose, Bld: 90 mg/dL (ref 65–99)
Potassium: 4.5 mmol/L (ref 3.5–5.3)
Sodium: 141 mmol/L (ref 135–146)
Total Bilirubin: 0.5 mg/dL (ref 0.2–1.2)
Total Protein: 7.5 g/dL (ref 6.1–8.1)

## 2017-05-15 LAB — LIPID PANEL
Cholesterol: 209 mg/dL — ABNORMAL HIGH (ref <200)
HDL: 51 mg/dL (ref >50)
LDL Cholesterol (Calc): 132 mg/dL (calc) — ABNORMAL HIGH
Non-HDL Cholesterol (Calc): 158 mg/dL (calc) — ABNORMAL HIGH (ref <130)
Total CHOL/HDL Ratio: 4.1 (calc) (ref <5.0)
Triglycerides: 151 mg/dL — ABNORMAL HIGH (ref <150)

## 2017-05-15 LAB — HEMOGLOBIN A1C
Hgb A1c MFr Bld: 6.2 % of total Hgb — ABNORMAL HIGH (ref <5.7)
Mean Plasma Glucose: 131 (calc)
eAG (mmol/L): 7.3 (calc)

## 2017-05-15 LAB — TSH: TSH: 2.09 mIU/L (ref 0.40–4.50)

## 2017-05-15 LAB — VITAMIN D 25 HYDROXY (VIT D DEFICIENCY, FRACTURES): Vit D, 25-Hydroxy: 30 ng/mL (ref 30–100)

## 2017-05-17 ENCOUNTER — Encounter: Payer: Self-pay | Admitting: Women's Health

## 2017-06-02 ENCOUNTER — Encounter: Payer: BLUE CROSS/BLUE SHIELD | Admitting: Women's Health

## 2017-06-03 DIAGNOSIS — M1712 Unilateral primary osteoarthritis, left knee: Secondary | ICD-10-CM | POA: Diagnosis not present

## 2017-06-06 NOTE — Patient Instructions (Signed)
It was a pleasure to see you today. Continue diet exercise and weight loss efforts. Return in 6 months.

## 2017-06-08 DIAGNOSIS — M542 Cervicalgia: Secondary | ICD-10-CM | POA: Diagnosis not present

## 2017-06-13 ENCOUNTER — Other Ambulatory Visit: Payer: Self-pay | Admitting: Internal Medicine

## 2017-06-13 NOTE — Telephone Encounter (Signed)
Refill whatever generic they want to use prn one year

## 2017-07-03 ENCOUNTER — Other Ambulatory Visit: Payer: Self-pay | Admitting: Internal Medicine

## 2017-07-12 ENCOUNTER — Ambulatory Visit: Payer: BLUE CROSS/BLUE SHIELD | Admitting: Women's Health

## 2017-07-12 ENCOUNTER — Encounter: Payer: Self-pay | Admitting: Women's Health

## 2017-07-12 VITALS — BP 130/80 | Wt 276.0 lb

## 2017-07-12 DIAGNOSIS — Z01419 Encounter for gynecological examination (general) (routine) without abnormal findings: Secondary | ICD-10-CM | POA: Diagnosis not present

## 2017-07-12 NOTE — Patient Instructions (Signed)
Health Maintenance for Postmenopausal Women Menopause is a normal process in which your reproductive ability comes to an end. This process happens gradually over a span of months to years, usually between the ages of 22 and 9. Menopause is complete when you have missed 12 consecutive menstrual periods. It is important to talk with your health care provider about some of the most common conditions that affect postmenopausal women, such as heart disease, cancer, and bone loss (osteoporosis). Adopting a healthy lifestyle and getting preventive care can help to promote your health and wellness. Those actions can also lower your chances of developing some of these common conditions. What should I know about menopause? During menopause, you may experience a number of symptoms, such as:  Moderate-to-severe hot flashes.  Night sweats.  Decrease in sex drive.  Mood swings.  Headaches.  Tiredness.  Irritability.  Memory problems.  Insomnia.  Choosing to treat or not to treat menopausal changes is an individual decision that you make with your health care provider. What should I know about hormone replacement therapy and supplements? Hormone therapy products are effective for treating symptoms that are associated with menopause, such as hot flashes and night sweats. Hormone replacement carries certain risks, especially as you become older. If you are thinking about using estrogen or estrogen with progestin treatments, discuss the benefits and risks with your health care provider. What should I know about heart disease and stroke? Heart disease, heart attack, and stroke become more likely as you age. This may be due, in part, to the hormonal changes that your body experiences during menopause. These can affect how your body processes dietary fats, triglycerides, and cholesterol. Heart attack and stroke are both medical emergencies. There are many things that you can do to help prevent heart disease  and stroke:  Have your blood pressure checked at least every 1-2 years. High blood pressure causes heart disease and increases the risk of stroke.  If you are 53-22 years old, ask your health care provider if you should take aspirin to prevent a heart attack or a stroke.  Do not use any tobacco products, including cigarettes, chewing tobacco, or electronic cigarettes. If you need help quitting, ask your health care provider.  It is important to eat a healthy diet and maintain a healthy weight. ? Be sure to include plenty of vegetables, fruits, low-fat dairy products, and lean protein. ? Avoid eating foods that are high in solid fats, added sugars, or salt (sodium).  Get regular exercise. This is one of the most important things that you can do for your health. ? Try to exercise for at least 150 minutes each week. The type of exercise that you do should increase your heart rate and make you sweat. This is known as moderate-intensity exercise. ? Try to do strengthening exercises at least twice each week. Do these in addition to the moderate-intensity exercise.  Know your numbers.Ask your health care provider to check your cholesterol and your blood glucose. Continue to have your blood tested as directed by your health care provider.  What should I know about cancer screening? There are several types of cancer. Take the following steps to reduce your risk and to catch any cancer development as early as possible. Breast Cancer  Practice breast self-awareness. ? This means understanding how your breasts normally appear and feel. ? It also means doing regular breast self-exams. Let your health care provider know about any changes, no matter how small.  If you are 40  or older, have a clinician do a breast exam (clinical breast exam or CBE) every year. Depending on your age, family history, and medical history, it may be recommended that you also have a yearly breast X-ray (mammogram).  If you  have a family history of breast cancer, talk with your health care provider about genetic screening.  If you are at high risk for breast cancer, talk with your health care provider about having an MRI and a mammogram every year.  Breast cancer (BRCA) gene test is recommended for women who have family members with BRCA-related cancers. Results of the assessment will determine the need for genetic counseling and BRCA1 and for BRCA2 testing. BRCA-related cancers include these types: ? Breast. This occurs in males or females. ? Ovarian. ? Tubal. This may also be called fallopian tube cancer. ? Cancer of the abdominal or pelvic lining (peritoneal cancer). ? Prostate. ? Pancreatic.  Cervical, Uterine, and Ovarian Cancer Your health care provider may recommend that you be screened regularly for cancer of the pelvic organs. These include your ovaries, uterus, and vagina. This screening involves a pelvic exam, which includes checking for microscopic changes to the surface of your cervix (Pap test).  For women ages 21-65, health care providers may recommend a pelvic exam and a Pap test every three years. For women ages 79-65, they may recommend the Pap test and pelvic exam, combined with testing for human papilloma virus (HPV), every five years. Some types of HPV increase your risk of cervical cancer. Testing for HPV may also be done on women of any age who have unclear Pap test results.  Other health care providers may not recommend any screening for nonpregnant women who are considered low risk for pelvic cancer and have no symptoms. Ask your health care provider if a screening pelvic exam is right for you.  If you have had past treatment for cervical cancer or a condition that could lead to cancer, you need Pap tests and screening for cancer for at least 20 years after your treatment. If Pap tests have been discontinued for you, your risk factors (such as having a new sexual partner) need to be  reassessed to determine if you should start having screenings again. Some women have medical problems that increase the chance of getting cervical cancer. In these cases, your health care provider may recommend that you have screening and Pap tests more often.  If you have a family history of uterine cancer or ovarian cancer, talk with your health care provider about genetic screening.  If you have vaginal bleeding after reaching menopause, tell your health care provider.  There are currently no reliable tests available to screen for ovarian cancer.  Lung Cancer Lung cancer screening is recommended for adults 69-62 years old who are at high risk for lung cancer because of a history of smoking. A yearly low-dose CT scan of the lungs is recommended if you:  Currently smoke.  Have a history of at least 30 pack-years of smoking and you currently smoke or have quit within the past 15 years. A pack-year is smoking an average of one pack of cigarettes per day for one year.  Yearly screening should:  Continue until it has been 15 years since you quit.  Stop if you develop a health problem that would prevent you from having lung cancer treatment.  Colorectal Cancer  This type of cancer can be detected and can often be prevented.  Routine colorectal cancer screening usually begins at  age 42 and continues through age 45.  If you have risk factors for colon cancer, your health care provider may recommend that you be screened at an earlier age.  If you have a family history of colorectal cancer, talk with your health care provider about genetic screening.  Your health care provider may also recommend using home test kits to check for hidden blood in your stool.  A small camera at the end of a tube can be used to examine your colon directly (sigmoidoscopy or colonoscopy). This is done to check for the earliest forms of colorectal cancer.  Direct examination of the colon should be repeated every  5-10 years until age 71. However, if early forms of precancerous polyps or small growths are found or if you have a family history or genetic risk for colorectal cancer, you may need to be screened more often.  Skin Cancer  Check your skin from head to toe regularly.  Monitor any moles. Be sure to tell your health care provider: ? About any new moles or changes in moles, especially if there is a change in a mole's shape or color. ? If you have a mole that is larger than the size of a pencil eraser.  If any of your family members has a history of skin cancer, especially at a young age, talk with your health care provider about genetic screening.  Always use sunscreen. Apply sunscreen liberally and repeatedly throughout the day.  Whenever you are outside, protect yourself by wearing long sleeves, pants, a wide-brimmed hat, and sunglasses.  What should I know about osteoporosis? Osteoporosis is a condition in which bone destruction happens more quickly than new bone creation. After menopause, you may be at an increased risk for osteoporosis. To help prevent osteoporosis or the bone fractures that can happen because of osteoporosis, the following is recommended:  If you are 46-71 years old, get at least 1,000 mg of calcium and at least 600 mg of vitamin D per day.  If you are older than age 55 but younger than age 65, get at least 1,200 mg of calcium and at least 600 mg of vitamin D per day.  If you are older than age 54, get at least 1,200 mg of calcium and at least 800 mg of vitamin D per day.  Smoking and excessive alcohol intake increase the risk of osteoporosis. Eat foods that are rich in calcium and vitamin D, and do weight-bearing exercises several times each week as directed by your health care provider. What should I know about how menopause affects my mental health? Depression may occur at any age, but it is more common as you become older. Common symptoms of depression  include:  Low or sad mood.  Changes in sleep patterns.  Changes in appetite or eating patterns.  Feeling an overall lack of motivation or enjoyment of activities that you previously enjoyed.  Frequent crying spells.  Talk with your health care provider if you think that you are experiencing depression. What should I know about immunizations? It is important that you get and maintain your immunizations. These include:  Tetanus, diphtheria, and pertussis (Tdap) booster vaccine.  Influenza every year before the flu season begins.  Pneumonia vaccine.  Shingles vaccine.  Your health care provider may also recommend other immunizations. This information is not intended to replace advice given to you by your health care provider. Make sure you discuss any questions you have with your health care provider. Document Released: 10/16/2005  Document Revised: 03/13/2016 Document Reviewed: 05/28/2015 Elsevier Interactive Patient Education  2018 Elsevier Inc.  

## 2017-07-12 NOTE — Progress Notes (Signed)
LAILANI TOOL Jan 01, 1956 [id number]    History:    Presents for annual exam.  Postmenopausal on no HRT with no bleeding. Normal Pap and mammogram history. 12/09/05 negative colon polyp. Primary care manages diabetes and hypothyroidism. 12/10/2014 normal DEXA. History of HSV-1. Has struggled with depression since husband died in 2006-12-10. Chronic left knee pain, states needs a knee replacement but is struggling with her weight as well as the cost for the surgery.  Past medical history, past surgical history, family history and social history were all reviewed and documented in the EPIC chart. Works for [employer]. Has 2 dogs and also fosters rescue dogs. Mother, diabetes, stroke and hypertension. Helping pay for aging parents care. History of drug addiction as a [employer] adult  ROS:  A ROS was performed and pertinent positives and negatives are included.  Exam:  Vitals:   07/12/17 1459  Weight: 276 lb (125.2 kg)   Body mass index is 42.59 kg/m.   General appearance:  Normal Thyroid:  Symmetrical, normal in size, without palpable masses or nodularity. Respiratory  Auscultation:  Clear without wheezing or rhonchi Cardiovascular  Auscultation:  Regular rate, without rubs, murmurs or gallops  Edema/varicosities:  Not grossly evident Abdominal  Soft,nontender, without masses, guarding or rebound.  Liver/spleen:  No organomegaly noted  Hernia:  None appreciated  Skin  Inspection:  Grossly normal   Breasts: Examined lying and sitting.     Right: Without masses, retractions, discharge or axillary adenopathy.     Left: Without masses, retractions, discharge or axillary adenopathy. Gentitourinary   Inguinal/mons:  Normal without inguinal adenopathy  External genitalia:  Normal  BUS/Urethra/Skene's glands:  Normal  Vagina:  Normal  Cervix:  Normal  Uterus: normal in size, shape and contour.  Midline and mobile  Adnexa/parametria:     Rt: Without masses or tenderness.   Lt: Without masses  or tenderness.  Anus and perineum: Normal  Digital rectal exam: Normal sphincter tone without palpated masses or tenderness  Assessment/Plan:  61 y.o. W WF G1P0  for annual exam with complaint of difficulty with sleep.   Postmenopausal/no HRT/no bleeding Hypothyroidism, diabetes-primary care manages labs and meds Obesity Chronic left knee pain-orthopedist managing Depression-no medication  Plan: Encouraged counseling, leisure activities, exercise, denies need for medication at this time. Sleep hygiene reviewed, denies desire for medication, SBE's, continue annual screening mammogram, calcium rich diet, vitamin D 2000 daily encouraged. Home safety, fall prevention and importance of weightbearing exercise reviewed. Aware of need to decrease carbs/calories and increase exercise for weight loss. Pap with HR HPV typing reviewed new screening guidelines. Huel Cote [facility] [place], 6:19 PM 07/12/2017

## 2017-07-15 LAB — PAP, TP IMAGING W/ HPV RNA, RFLX HPV TYPE 16,18/45: HPV DNA High Risk: NOT DETECTED

## 2017-07-26 DIAGNOSIS — M7061 Trochanteric bursitis, right hip: Secondary | ICD-10-CM | POA: Diagnosis not present

## 2017-08-12 DIAGNOSIS — H25813 Combined forms of age-related cataract, bilateral: Secondary | ICD-10-CM | POA: Diagnosis not present

## 2017-08-12 DIAGNOSIS — H04123 Dry eye syndrome of bilateral lacrimal glands: Secondary | ICD-10-CM | POA: Diagnosis not present

## 2017-08-12 DIAGNOSIS — E119 Type 2 diabetes mellitus without complications: Secondary | ICD-10-CM | POA: Diagnosis not present

## 2017-08-12 LAB — HM DIABETES EYE EXAM

## 2017-08-14 ENCOUNTER — Other Ambulatory Visit: Payer: Self-pay | Admitting: Internal Medicine

## 2017-08-20 ENCOUNTER — Encounter: Payer: Self-pay | Admitting: Internal Medicine

## 2017-09-07 DIAGNOSIS — I499 Cardiac arrhythmia, unspecified: Secondary | ICD-10-CM

## 2017-09-07 DIAGNOSIS — R7303 Prediabetes: Secondary | ICD-10-CM

## 2017-09-07 HISTORY — DX: Prediabetes: R73.03

## 2017-09-07 HISTORY — DX: Cardiac arrhythmia, unspecified: I49.9

## 2017-09-30 ENCOUNTER — Ambulatory Visit: Payer: BLUE CROSS/BLUE SHIELD | Admitting: Internal Medicine

## 2017-09-30 ENCOUNTER — Encounter: Payer: Self-pay | Admitting: Internal Medicine

## 2017-09-30 VITALS — BP 140/78 | HR 88 | Temp 98.3°F | Wt 277.0 lb

## 2017-09-30 DIAGNOSIS — L292 Pruritus vulvae: Secondary | ICD-10-CM | POA: Diagnosis not present

## 2017-09-30 DIAGNOSIS — L258 Unspecified contact dermatitis due to other agents: Secondary | ICD-10-CM

## 2017-09-30 DIAGNOSIS — F419 Anxiety disorder, unspecified: Secondary | ICD-10-CM | POA: Diagnosis not present

## 2017-09-30 DIAGNOSIS — G4700 Insomnia, unspecified: Secondary | ICD-10-CM | POA: Diagnosis not present

## 2017-09-30 MED ORDER — CLOBETASOL PROPIONATE 0.05 % EX OINT: 1.0000 "application " | TOPICAL_OINTMENT | Freq: Two times a day (BID) | CUTANEOUS | 0 refills | Status: DC

## 2017-09-30 MED ORDER — PROMETHAZINE HCL 25 MG PO TABS: ORAL_TABLET | ORAL | 1 refills | Status: DC

## 2017-09-30 MED ORDER — ALPRAZOLAM 1 MG PO TABS: ORAL_TABLET | ORAL | 5 refills | Status: DC

## 2017-09-30 MED ORDER — CLOTRIMAZOLE-BETAMETHASONE 1-0.05 % EX CREA: 1.0000 "application " | TOPICAL_CREAM | Freq: Two times a day (BID) | CUTANEOUS | 2 refills | Status: DC

## 2017-09-30 NOTE — Patient Instructions (Signed)
Temovate 0.05% ointment sparingly to rash around waist twice daily.  Lotrisone cream to genital area twice daily.  Refill Xanax for insomnia.  Refill Phenergan for nausea.

## 2017-09-30 NOTE — Progress Notes (Signed)
   Subjective:    Patient ID: Mckenzie Hebert, female    DOB: [birth year], 62 y.o.   MRN: [id number]  HPI 62 year old Female with rash around her waist area where the elastic waistband in her slacks touches her waist anteriorly.  It is very itchy.  She has been scratching it some.  She has a few excoriations where she has been scratching at night.  Not sleeping well for some time.  Needs refill on Xanax.  Says is been 11 years this month since her husband died.  She continues to work for [employer].  Lives alone with her dogs.  She has several creams that she has tried without relief including ketoconazole cream.  Also complains of some intermittent genital itching and would like a cream for that.    Review of Systems see above.  There is no travel history.  She did recently change to Tide detergent and wonders if this caused some itching.  She is not using that any longer.     Objective:   Physical Exam  She has macular erythema along the elastic waistband of her slacks anteriorly.  There 2 or 3 excoriations around the right anterior abdomen is well without infection.  No other rash noted.      Assessment & Plan:  Contact dermatitis  Genital itching  Plan: She will use Temovate 0.05% ointment twice daily for rash around her waist.  Have refilled her Xanax which should hopefully help her sleep and not stay up at night itching.  She will use Lotrisone cream in genital area for itching.  If rash is not improved in 2 weeks, she needs to see dermatologist.  She has seen Dr. Allyson Sabal before.  Phenergan if needed for nausea.  She would like to keep a refill for this.

## 2017-11-03 DIAGNOSIS — M7061 Trochanteric bursitis, right hip: Secondary | ICD-10-CM | POA: Diagnosis not present

## 2017-11-12 ENCOUNTER — Telehealth: Payer: Self-pay | Admitting: Internal Medicine

## 2017-11-12 NOTE — Telephone Encounter (Signed)
Book OV and have her bring notice with her.
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
## 2017-11-15 ENCOUNTER — Encounter: Payer: Self-pay | Admitting: Internal Medicine

## 2017-11-15 ENCOUNTER — Ambulatory Visit: Payer: BLUE CROSS/BLUE SHIELD | Admitting: Internal Medicine

## 2017-11-15 VITALS — BP 150/90 | HR 96 | Wt 277.0 lb

## 2017-11-15 DIAGNOSIS — F439 Reaction to severe stress, unspecified: Secondary | ICD-10-CM | POA: Diagnosis not present

## 2017-11-15 DIAGNOSIS — F419 Anxiety disorder, unspecified: Secondary | ICD-10-CM | POA: Diagnosis not present

## 2017-11-15 NOTE — Telephone Encounter (Signed)
Spoke with patient and provided appointment for today, 3/11 @ 2:45 and patient is to bring all documents with her.

## 2017-11-18 NOTE — Patient Instructions (Addendum)
Letter written to federal court asking that patient be excused from jury duty.  Continue Xanax as previously prescribed.

## 2017-11-18 NOTE — Progress Notes (Signed)
   Subjective:    Patient ID: Mckenzie Hebert, female    DOB: [birth year], 62 y.o.   MRN: [id number]  HPI 62 year old Female with significant anxiety disorder treated with Xanax 1 mg at bedtime.  She does fairly well.  She resides alone.  She is a widow.  Recently received summons for jury duty in federal court and this exacerbated more anxiety.  She does not feel she concerned due to her anxiety disorder.  Her husband served time in prison and she had a brother-in-law that was murdered.  She resides alone and generally is not afraid to stay by herself at night but this sometimes brought up more anxiety when she received it and caused some concerns about staying alone at night.    Review of Systems see above     Objective:   Physical Exam She is anxious in the office today.  I believe it would be in her best interest to be excuse from jury duty and I have written a letter to the court indicating that.       Assessment & Plan:  Situational stress  Anxiety disorder  Plan: Letter to court asking the patient be excused from federal court jury duty due to anxiety disorder.  Spent 15 minutes with patient and 15 minutes composing a letter.

## 2017-11-25 ENCOUNTER — Other Ambulatory Visit: Payer: Self-pay | Admitting: Gastroenterology

## 2017-11-25 DIAGNOSIS — K5904 Chronic idiopathic constipation: Secondary | ICD-10-CM | POA: Diagnosis not present

## 2017-11-25 DIAGNOSIS — R1011 Right upper quadrant pain: Secondary | ICD-10-CM | POA: Diagnosis not present

## 2017-11-25 DIAGNOSIS — R140 Abdominal distension (gaseous): Secondary | ICD-10-CM | POA: Diagnosis not present

## 2017-11-25 DIAGNOSIS — K219 Gastro-esophageal reflux disease without esophagitis: Secondary | ICD-10-CM | POA: Diagnosis not present

## 2017-12-02 ENCOUNTER — Ambulatory Visit (HOSPITAL_COMMUNITY)
Admission: RE | Admit: 2017-12-02 | Discharge: 2017-12-02 | Disposition: A | Discharge status: Home / Self Care | Payer: BLUE CROSS/BLUE SHIELD | Source: Ambulatory Visit | Attending: Gastroenterology | Admitting: Gastroenterology

## 2017-12-02 DIAGNOSIS — R1011 Right upper quadrant pain: Secondary | ICD-10-CM | POA: Insufficient documentation

## 2017-12-02 MED ORDER — TECHNETIUM TC 99M MEBROFENIN IV KIT
5.0000 | PACK | Freq: Once | INTRAVENOUS | Status: AC | PRN
  Administered 2017-12-02: 5 via INTRAVENOUS

## 2017-12-31 ENCOUNTER — Other Ambulatory Visit: Payer: Self-pay | Admitting: Internal Medicine

## 2017-12-31 DIAGNOSIS — E785 Hyperlipidemia, unspecified: Secondary | ICD-10-CM

## 2017-12-31 DIAGNOSIS — Z1321 Encounter for screening for nutritional disorder: Secondary | ICD-10-CM

## 2017-12-31 DIAGNOSIS — E119 Type 2 diabetes mellitus without complications: Secondary | ICD-10-CM

## 2017-12-31 DIAGNOSIS — Z0000 Encounter for general adult medical examination without abnormal findings: Secondary | ICD-10-CM

## 2017-12-31 DIAGNOSIS — E039 Hypothyroidism, unspecified: Secondary | ICD-10-CM

## 2018-01-10 ENCOUNTER — Other Ambulatory Visit: Payer: BLUE CROSS/BLUE SHIELD | Admitting: Internal Medicine

## 2018-01-10 DIAGNOSIS — E785 Hyperlipidemia, unspecified: Secondary | ICD-10-CM

## 2018-01-10 DIAGNOSIS — E119 Type 2 diabetes mellitus without complications: Secondary | ICD-10-CM | POA: Diagnosis not present

## 2018-01-10 DIAGNOSIS — E039 Hypothyroidism, unspecified: Secondary | ICD-10-CM

## 2018-01-10 DIAGNOSIS — Z0000 Encounter for general adult medical examination without abnormal findings: Secondary | ICD-10-CM

## 2018-01-10 DIAGNOSIS — Z1321 Encounter for screening for nutritional disorder: Secondary | ICD-10-CM

## 2018-01-11 LAB — CBC WITH DIFFERENTIAL/PLATELET
Basophils Absolute: 32 cells/uL (ref 0–200)
Basophils Relative: 0.5 %
Eosinophils Absolute: 202 cells/uL (ref 15–500)
Eosinophils Relative: 3.2 %
HCT: 38.4 % (ref 35.0–45.0)
Hemoglobin: 12.7 g/dL (ref 11.7–15.5)
Lymphs Abs: 1481 cells/uL (ref 850–3900)
MCH: 26 pg — ABNORMAL LOW (ref 27.0–33.0)
MCHC: 33.1 g/dL (ref 32.0–36.0)
MCV: 78.5 fL — ABNORMAL LOW (ref 80.0–100.0)
MPV: 10 fL (ref 7.5–12.5)
Monocytes Relative: 5.7 %
Neutro Abs: 4227 cells/uL (ref 1500–7800)
Neutrophils Relative %: 67.1 %
Platelets: 272 10*3/uL (ref 140–400)
RBC: 4.89 10*6/uL (ref 3.80–5.10)
RDW: 14.2 % (ref 11.0–15.0)
Total Lymphocyte: 23.5 %
WBC mixed population: 359 cells/uL (ref 200–950)
WBC: 6.3 10*3/uL (ref 3.8–10.8)

## 2018-01-11 LAB — COMPLETE METABOLIC PANEL WITH GFR
AG Ratio: 1.4 (calc) (ref 1.0–2.5)
ALT: 21 U/L (ref 6–29)
AST: 19 U/L (ref 10–35)
Albumin: 4.1 g/dL (ref 3.6–5.1)
Alkaline phosphatase (APISO): 65 U/L (ref 33–130)
BUN: 12 mg/dL (ref 7–25)
CO2: 30 mmol/L (ref 20–32)
Calcium: 9.3 mg/dL (ref 8.6–10.4)
Chloride: 101 mmol/L (ref 98–110)
Creat: 0.77 mg/dL (ref 0.50–0.99)
GFR, Est African American: 96 mL/min/{1.73_m2} (ref >=60)
GFR, Est Non African American: 83 mL/min/{1.73_m2} (ref >=60)
Globulin: 2.9 g/dL (calc) (ref 1.9–3.7)
Glucose, Bld: 109 mg/dL — ABNORMAL HIGH (ref 65–99)
Potassium: 4.2 mmol/L (ref 3.5–5.3)
Sodium: 137 mmol/L (ref 135–146)
Total Bilirubin: 0.4 mg/dL (ref 0.2–1.2)
Total Protein: 7 g/dL (ref 6.1–8.1)

## 2018-01-11 LAB — LIPID PANEL
Cholesterol: 218 mg/dL — ABNORMAL HIGH (ref <200)
HDL: 48 mg/dL — ABNORMAL LOW (ref >50)
LDL Cholesterol (Calc): 141 mg/dL (calc) — ABNORMAL HIGH
Non-HDL Cholesterol (Calc): 170 mg/dL (calc) — ABNORMAL HIGH (ref <130)
Total CHOL/HDL Ratio: 4.5 (calc) (ref <5.0)
Triglycerides: 154 mg/dL — ABNORMAL HIGH (ref <150)

## 2018-01-11 LAB — MICROALBUMIN / CREATININE URINE RATIO
Creatinine, Urine: 87 mg/dL (ref 20–275)
Microalb Creat Ratio: 2 mcg/mg creat (ref <30)
Microalb, Ur: 0.2 mg/dL

## 2018-01-11 LAB — HEMOGLOBIN A1C
Hgb A1c MFr Bld: 6.6 % of total Hgb — ABNORMAL HIGH (ref <5.7)
Mean Plasma Glucose: 143 (calc)
eAG (mmol/L): 7.9 (calc)

## 2018-01-11 LAB — VITAMIN D 25 HYDROXY (VIT D DEFICIENCY, FRACTURES): Vit D, 25-Hydroxy: 24 ng/mL — ABNORMAL LOW (ref 30–100)

## 2018-01-11 LAB — TSH: TSH: 3.24 mIU/L (ref 0.40–4.50)

## 2018-01-14 ENCOUNTER — Ambulatory Visit (INDEPENDENT_AMBULATORY_CARE_PROVIDER_SITE_OTHER): Payer: BLUE CROSS/BLUE SHIELD | Admitting: Internal Medicine

## 2018-01-14 VITALS — BP 128/70 | HR 73 | Ht 66.0 in | Wt 277.0 lb

## 2018-01-14 DIAGNOSIS — K219 Gastro-esophageal reflux disease without esophagitis: Secondary | ICD-10-CM | POA: Diagnosis not present

## 2018-01-14 DIAGNOSIS — E7800 Pure hypercholesterolemia, unspecified: Secondary | ICD-10-CM

## 2018-01-14 DIAGNOSIS — Z8659 Personal history of other mental and behavioral disorders: Secondary | ICD-10-CM | POA: Diagnosis not present

## 2018-01-14 DIAGNOSIS — F411 Generalized anxiety disorder: Secondary | ICD-10-CM | POA: Diagnosis not present

## 2018-01-14 DIAGNOSIS — R7302 Impaired glucose tolerance (oral): Secondary | ICD-10-CM | POA: Diagnosis not present

## 2018-01-14 DIAGNOSIS — E8881 Metabolic syndrome: Secondary | ICD-10-CM

## 2018-01-14 DIAGNOSIS — Z0000 Encounter for general adult medical examination without abnormal findings: Secondary | ICD-10-CM

## 2018-01-14 DIAGNOSIS — E039 Hypothyroidism, unspecified: Secondary | ICD-10-CM

## 2018-01-14 DIAGNOSIS — Z6841 Body Mass Index (BMI) 40.0 and over, adult: Secondary | ICD-10-CM | POA: Diagnosis not present

## 2018-01-14 DIAGNOSIS — G4709 Other insomnia: Secondary | ICD-10-CM | POA: Diagnosis not present

## 2018-01-14 DIAGNOSIS — Z8709 Personal history of other diseases of the respiratory system: Secondary | ICD-10-CM | POA: Diagnosis not present

## 2018-01-14 LAB — POCT URINALYSIS DIPSTICK
Appearance: NORMAL
Bilirubin, UA: NEGATIVE
Blood, UA: NEGATIVE
Glucose, UA: NEGATIVE
Ketones, UA: NEGATIVE
Leukocytes, UA: NEGATIVE
Nitrite, UA: NEGATIVE
Odor: NORMAL
Protein, UA: NEGATIVE
Spec Grav, UA: 1.01 (ref 1.010–1.025)
Urobilinogen, UA: 0.2 E.U./dL
pH, UA: 7.5 (ref 5.0–8.0)

## 2018-01-14 MED ORDER — ALPRAZOLAM 1 MG PO TABS: 1.0000 mg | ORAL_TABLET | Freq: Two times a day (BID) | ORAL | 1 refills | Status: DC

## 2018-01-14 MED ORDER — ROSUVASTATIN CALCIUM 5 MG PO TABS: 5.0000 mg | ORAL_TABLET | Freq: Every day | ORAL | 3 refills | Status: DC

## 2018-01-14 NOTE — Progress Notes (Signed)
Subjective:    Patient ID: Mckenzie Hebert, female    DOB: 1955/12/13, 62 y.o.   MRN: [id number]  HPI 62 year old Female in today for health maintenance exam and evaluation of medical issues.  Continues with issues with anxiety.  Sometimes gets anxious at work or even with driving.  Lives alone and is sometimes anxious at night.  We are going to increase her Xanax prescription to 1 mg twice daily as needed.  She had H. pylori diagnosed in 2009.  Angioedema of the lip in 2010.  Herpes zoster June 2011.  History of asthma treated with Advair and Singulair.  History of asthma, eczema, hyperplastic colon polyps, insomnia, urticaria, history of migraine headaches, hypothyroidism, history of vitamin D deficiency, history of Chiari I malformation, anxiety, GE reflux, obesity and impaired glucose tolerance.  History of palpitations which started when working the evening she has.  She had colonoscopy by Dr. Collene Mares July 2007.  She takes laxatives approximately 3 times a week for functional constipation.  Has had issues with sleeping related to working evening shifts  Review of her lab work shows that her hemoglobin A1c is increased from 6.2% to 6.6% vitamin D level is low at 24.  MCV was found to be low at 78.5 however iron level and ferritin were normal.  Left knee arthroscopic 2007.  Appendectomy 1993.  Bone spur removed from left foot 1999.  Bone spur removed from right foot 2008.  Left knee surgery December 2008.  Social history: Patient is a widow.  Husband died of heart problems.  No children.  She volunteers with hospice.  She smoked until the age of 55 and then quit.  No alcohol consumption.  She enjoys dogs.  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  Family history: Parents are deceased.  Mother died at age 61 with history of hypertension, coronary artery disease, MI, status post CABG.  Father died in his 11s with history of lung cancer.  Mother had diabetes and  hypertension.  2 sisters.  One sister with history of diabetes and hypertension.  2 brothers.    Review of Systems  Constitutional: Positive for fatigue.  Respiratory:       History of asthma  Cardiovascular: Negative for chest pain.  Gastrointestinal:       GE reflux  Genitourinary: Negative.   Neurological:       History of migraine headaches and has been evaluated by headache specialist in the past  Psychiatric/Behavioral:       Anxiety and insomnia   see above     Objective:   Physical Exam  Constitutional: She appears well-developed and well-nourished.  HENT:  Head: Normocephalic and atraumatic.  Right Ear: External ear normal.  Left Ear: External ear normal.  Mouth/Throat: Oropharynx is clear and moist. No oropharyngeal exudate.  Eyes: Pupils are equal, round, and reactive to light. EOM are normal. Right eye exhibits no discharge. Left eye exhibits no discharge. No scleral icterus.  Neck: No JVD present. No thyromegaly present.  Cardiovascular: Normal rate, regular rhythm, normal heart sounds and intact distal pulses. Exam reveals no friction rub.  No murmur heard. Pulmonary/Chest: Effort normal and breath sounds normal. No stridor. No respiratory distress. She has no wheezes. She has no rales.  Abdominal: Soft. She exhibits no distension and no mass. There is no tenderness. There is no rebound and no guarding.  Musculoskeletal: She exhibits no edema or deformity.  Lymphadenopathy:    She has no cervical adenopathy.  Neurological: She displays normal reflexes. No cranial nerve deficit or sensory deficit. She exhibits normal muscle tone. Coordination normal.  Skin: No rash noted.  Psychiatric: She has a normal mood and affect. Her behavior is normal. Judgment and thought content normal.  Vitals reviewed.         Assessment & Plan:   History of asthma  GE reflux  Anxiety-increase Xanax to 1 mg twice daily  Impaired glucose tolerance  BMI 44.71-has tried  various weight loss programs and diets over the years.  May want to consider Dr. Migdalia Dk clinic.  Vitamin D deficiency-recommend 2000 units vitamin D3 daily  Hypothyroidism continue same dose of thyroid replacement-  Metabolic syndrome  Elevated LDL cholesterol-prescription for Crestor 5 mg daily  Eczema-refill Temovate at her request  Insomnia treated with Xanax  Plan: Follow-up in 6 months with office visit, lipid panel, liver functions, hemoglobin A1c and TSH.

## 2018-01-15 LAB — IRON,TIBC AND FERRITIN PANEL
%SAT: 15 % (calc) (ref 11–50)
Ferritin: 22 ng/mL (ref 20–288)
Iron: 60 ug/dL (ref 45–160)
TIBC: 398 mcg/dL (calc) (ref 250–450)

## 2018-02-03 ENCOUNTER — Encounter: Payer: Self-pay | Admitting: Internal Medicine

## 2018-02-03 NOTE — Patient Instructions (Addendum)
Increase Xanax to 1 mg twice daily if needed.  Continue same dose of thyroid replacement.  Take Crestor for dyslipidemia.  Continue reflux medication.  Please watch diet.  Try to exercise.  Consider seeing Dr. Leafy Ro at her clinic for weight loss.  Return in 6 months

## 2018-02-04 DIAGNOSIS — Z713 Dietary counseling and surveillance: Secondary | ICD-10-CM | POA: Diagnosis not present

## 2018-02-05 ENCOUNTER — Other Ambulatory Visit: Payer: Self-pay | Admitting: Internal Medicine

## 2018-02-24 ENCOUNTER — Ambulatory Visit: Payer: BLUE CROSS/BLUE SHIELD | Admitting: Cardiology

## 2018-03-02 DIAGNOSIS — Z713 Dietary counseling and surveillance: Secondary | ICD-10-CM | POA: Diagnosis not present

## 2018-03-28 ENCOUNTER — Encounter: Payer: Self-pay | Admitting: Women's Health

## 2018-03-28 ENCOUNTER — Ambulatory Visit: Payer: BLUE CROSS/BLUE SHIELD | Admitting: Women's Health

## 2018-03-28 VITALS — BP 130/82

## 2018-03-28 DIAGNOSIS — R1031 Right lower quadrant pain: Secondary | ICD-10-CM

## 2018-03-28 DIAGNOSIS — M545 Low back pain: Secondary | ICD-10-CM | POA: Diagnosis not present

## 2018-03-28 NOTE — Progress Notes (Signed)
62 year old W WF G1 P0 presents with complaint of achy type lower rt back pain for months, and lower right abdominal  "pinching", pressure sensation  for over a month. No change in discomfort with activity, rest or BMs. No change in activity or routine.    Denies nausea, urinary symptoms, vaginal discharge or fever. Reports frequent constipation. Postmenopausal on no HRT with no bleeding. Not sexually active. Primary care manages diabetes, hypothyroidism and asthma.  Has struggled with depression for many years, was widowed in 2008 and continues to struggle with loss of husband. Hx of appendectomy. 11/2017 normal abdominal US and normal GB. Works for [employer], Network engineer job.  Exam: appears well, came from work. No CVAT, discomfort upper rt buttock, abdomen obese, slight tenderness both right and left side of abdomen, no rebound. External genitalia WNL, no erythema, speculum exam atrophy, no discharge, Bimanual, no CMT or adnexal pain, discomfort lower rt abd, above pelvis. UA: completley negative.   Rt lower abdominal pain.   Plan:  Schedule [place]. Motrin as needed for discomfort.

## 2018-03-29 DIAGNOSIS — M545 Low back pain: Secondary | ICD-10-CM | POA: Diagnosis not present

## 2018-03-29 NOTE — Addendum Note (Signed)
Addended by: Lorine Bears on: 03/29/2018 11:32 AM   Modules accepted: Orders

## 2018-03-30 DIAGNOSIS — Z713 Dietary counseling and surveillance: Secondary | ICD-10-CM | POA: Diagnosis not present

## 2018-03-30 LAB — URINALYSIS, COMPLETE W/RFL CULTURE
Bacteria, UA: NONE SEEN /HPF
Bilirubin Urine: NEGATIVE
Glucose, UA: NEGATIVE
Hgb urine dipstick: NEGATIVE
Hyaline Cast: NONE SEEN /LPF
Ketones, ur: NEGATIVE
Leukocyte Esterase: NEGATIVE
Nitrites, Initial: NEGATIVE
Protein, ur: NEGATIVE
RBC / HPF: NONE SEEN /HPF (ref 0–2)
Specific Gravity, Urine: 1.015 (ref 1.001–1.03)
WBC, UA: NONE SEEN /HPF (ref 0–5)
pH: 6.5 (ref 5.0–8.0)

## 2018-03-30 LAB — NO CULTURE INDICATED

## 2018-03-31 NOTE — Progress Notes (Signed)
Cardiology Office Note   Date:  04/01/2018   ID:  Mckenzie Hebert, DOB 12-21-1955, MRN [id number]  PCP:  Elby Showers, MD  Cardiologist:   No primary care provider on file.   Chief Complaint  Patient presents with  . Palpitations      History of Present Illness: Mckenzie Hebert is a 62 y.o. female who presents for follow up of palpitations.   She was seen in Jan of last year for this.  She had a normal Lexiscan Myoview in 2017 and a Holter in 2015 with mild ectopy.  She returns for follow up.  She has done well since she was last seen.  The patient denies any new symptoms such as chest discomfort, neck or arm discomfort. There has been no new shortness of breath, PND or orthopnea. There have been no reported palpitations, presyncope or syncope.   She walks rescue dogs.  She is slightly limited by knee pain.     Past Medical History:  Diagnosis Date  . AC (acromioclavicular) joint bone spurs 2008   right foot  . Bronchitis   . Colon polyps    Dr Collene Mares  . Endometrial polyp   . Migraines    Dr Catalina Gravel  . Uterine fibroid     Past Surgical History:  Procedure Laterality Date  . APPENDECTOMY    . FOOT SURGERY  2002, 2008   bi-lat , bone spurs, achilles tendon work  . HYSTEROSCOPY     endo polyp and myoma  . KNEE SURGERY  2006,2008   arthroscopic, left X 2  . RESECTION TUMOR CLAVICLE RADICAL  1985   benign  . TONSILLECTOMY       Current Outpatient Medications  Medication Sig Dispense Refill  . albuterol (PROAIR HFA) 108 (90 Base) MCG/ACT inhaler 2 sprays po qid prn 17 Inhaler 4  . ALPRAZolam (XANAX) 1 MG tablet Take 1 tablet (1 mg total) by mouth 2 (two) times daily. 60 tablet 1  . Cholecalciferol (VITAMIN D3 PO) Take 4,000 Int'l Units by mouth as needed.     . clobetasol ointment (TEMOVATE) 0.62 % Apply 1 application topically 2 (two) times daily. 30 g 0  . clotrimazole-betamethasone (LOTRISONE) cream Apply 1 application topically 2 (two) times daily. (Patient  taking differently: Apply 1 application topically as needed. ) 30 g 2  . cyclobenzaprine (FLEXERIL) 10 MG tablet Take 1 tablet (10 mg total) by mouth 3 (three) times daily as needed. (Patient taking differently: Take 10 mg by mouth as needed. ) 30 tablet 0  . levothyroxine (SYNTHROID, LEVOTHROID) 125 MCG tablet TAKE 1 TABLET BY MOUTH EVERY DAY 90 tablet 2  . pantoprazole (PROTONIX) 40 MG tablet TAKE 1 TABLET (40 MG TOTAL) BY MOUTH DAILY. 90 tablet 3  . promethazine (PHENERGAN) 25 MG tablet One po q 12 hours prn nausea 30 tablet 1   No current facility-administered medications for this visit.     Allergies:   Patient has no known allergies.     ROS:  Please see the history of present illness.   Otherwise, review of systems are positive for none.   All other systems are reviewed and negative.    PHYSICAL EXAM: VS:  BP 137/79   Pulse 80   Ht 5\' 7"  (1.702 m)   Wt 274 lb (124.3 kg)   LMP 03/18/2008   BMI 42.91 kg/m  , BMI Body mass index is 42.91 kg/m. GENERAL:  Well appearing NECK:  No jugular venous  distention, waveform within normal limits, carotid upstroke brisk and symmetric, no bruits, no thyromegaly LUNGS:  Clear to auscultation bilaterally CHEST:  Unremarkable HEART:  PMI not displaced or sustained,S1 and S2 within normal limits, no S3, no S4, no clicks, no rubs, none murmurs ABD:  Flat, positive bowel sounds normal in frequency in pitch, no bruits, no rebound, no guarding, no midline pulsatile mass, no hepatomegaly, no splenomegaly EXT:  2 plus pulses throughout, no edema, no cyanosis no clubbing    EKG:  EKG is ordered today. The ekg ordered today demonstrates sinus rhythm, rate 80, axis within normal limits, intervals within normal limits, no acute ST-T wave changes.   Recent Labs: 01/10/2018: ALT 21; BUN 12; Creat 0.77; Hemoglobin 12.7; Platelets 272; Potassium 4.2; Sodium 137; TSH 3.24    Lipid Panel    Component Value Date/Time   CHOL 218 (H) 01/10/2018 0949    TRIG 154 (H) 01/10/2018 0949   HDL 48 (L) 01/10/2018 0949   CHOLHDL 4.5 01/10/2018 0949   VLDL 17 05/12/2016 0933   LDLCALC 141 (H) 01/10/2018 0949      Wt Readings from Last 3 Encounters:  04/01/18 274 lb (124.3 kg)  01/14/18 277 lb (125.6 kg)  11/15/17 277 lb (125.6 kg)      Other studies Reviewed: Additional studies/ records that were reviewed today include: Labs. Review of the above records demonstrates:  Please see elsewhere in the note.     ASSESSMENT AND PLAN:  PALPITATIONS:   She is not particularly bothered by these.  She might have one occasionally.  At this point no change in therapy is indicated.  CHEST PAIN: She is not currently having these symptoms.  No further testing is indicated.  There is been no change since the perfusion study.  DM:  A1C was elevated.  However, she did not want to take metformin.  She is working with a nutritionist and she would like to see an endocrinologist.     Current medicines are reviewed at length with the patient today.  The patient does not have concerns regarding medicines.  The following changes have been made:  no change  Labs/ tests ordered today include: None  Orders Placed This Encounter  Procedures  . EKG 12-Lead     Disposition:   FU with me in one year.     Signed, Minus Breeding, MD  04/01/2018 4:39 PM    [facility]

## 2018-04-01 ENCOUNTER — Encounter: Payer: Self-pay | Admitting: Cardiology

## 2018-04-01 ENCOUNTER — Ambulatory Visit: Payer: BLUE CROSS/BLUE SHIELD | Admitting: Cardiology

## 2018-04-01 VITALS — BP 137/79 | HR 80 | Ht 67.0 in | Wt 274.0 lb

## 2018-04-01 DIAGNOSIS — R002 Palpitations: Secondary | ICD-10-CM

## 2018-04-01 NOTE — Progress Notes (Signed)
Patient told Cardiologist she wanted to see Endocrinologist. Please call her and ask which one would she like to see?

## 2018-04-01 NOTE — Patient Instructions (Signed)
Medication Instructions:  Continue current medications  If you need a refill on your cardiac medications before your next appointment, please call your pharmacy.  Labwork: None Ordered   Testing/Procedures: None Ordered  Follow-Up: Your physician wants you to follow-up in: 1 Year. You should receive a reminder letter in the mail two months in advance. If you do not receive a letter, please call our office [phone].    Thank you for choosing [facility]!!      

## 2018-04-14 DIAGNOSIS — M1712 Unilateral primary osteoarthritis, left knee: Secondary | ICD-10-CM | POA: Diagnosis not present

## 2018-04-18 ENCOUNTER — Other Ambulatory Visit: Payer: Self-pay | Admitting: Internal Medicine

## 2018-04-18 DIAGNOSIS — Z1231 Encounter for screening mammogram for malignant neoplasm of breast: Secondary | ICD-10-CM

## 2018-04-19 ENCOUNTER — Encounter: Payer: Self-pay | Admitting: Women's Health

## 2018-04-19 ENCOUNTER — Other Ambulatory Visit: Payer: Self-pay | Admitting: Women's Health

## 2018-04-19 ENCOUNTER — Ambulatory Visit (INDEPENDENT_AMBULATORY_CARE_PROVIDER_SITE_OTHER): Payer: BLUE CROSS/BLUE SHIELD

## 2018-04-19 ENCOUNTER — Ambulatory Visit: Payer: BLUE CROSS/BLUE SHIELD | Admitting: Women's Health

## 2018-04-19 DIAGNOSIS — N9489 Other specified conditions associated with female genital organs and menstrual cycle: Secondary | ICD-10-CM

## 2018-04-19 DIAGNOSIS — D251 Intramural leiomyoma of uterus: Secondary | ICD-10-CM

## 2018-04-19 DIAGNOSIS — R1031 Right lower quadrant pain: Secondary | ICD-10-CM | POA: Diagnosis not present

## 2018-04-19 DIAGNOSIS — D252 Subserosal leiomyoma of uterus: Secondary | ICD-10-CM | POA: Diagnosis not present

## 2018-04-19 DIAGNOSIS — N840 Polyp of corpus uteri: Secondary | ICD-10-CM | POA: Diagnosis not present

## 2018-04-19 NOTE — Progress Notes (Signed)
62 year old WWF G1, P0 presents for ultrasound.  Has been having right lower quadrant pain, pinching sensation that radiates to the back for the past month that has increased.  Some relief of discomfort with manually applying pressure/pushing in, no rebound.  History of an endometrial polyp 2011.  Known history of fibroids.  Postmenopausal on no HRT with no bleeding.   Chronic knee pain is hoping to have a knee replacement has postponed for now due to high cost.  Medical problems include hypothyroidism, GERD,  anxiety and depression.  Exam : Appears well, obese.  Ultrasound: T/V anteverted uterus with intramural fibroids 6 x 6 mm, 12 x 11 mm, 14 x 11 mm, left 44 x 20 x 41 mm, feeder vessel seen exophytic, endometrium prominent with solid focus 12 x 7 x 9 mm, negative CFD.  Right and left ovary scattered microcalcifications and tiny cyst less than 9 mm.  Negative cul-de-sac.  Questionable endometrial polyp versus fibroid  Plan: Schedule sonohysterogram with Dr. Phineas Real.  Ultrasound reviewed.

## 2018-04-19 NOTE — Patient Instructions (Signed)
Uterine Fibroids Uterine fibroids are tissue masses (tumors). They are also called leiomyomas. They can develop inside of a woman's womb (uterus). They can grow very large. Fibroids are not cancerous (benign). Most fibroids do not require medical treatment. Follow these instructions at home:  Keep all follow-up visits as told by your doctor. This is important.  Take medicines only as told by your doctor. ? If you were prescribed a hormone treatment, take the hormone medicines exactly as told. ? Do not take aspirin. It can cause bleeding.  Ask your doctor about taking iron pills and increasing the amount of dark green, leafy vegetables in your diet. These actions can help to boost your blood iron levels.  Pay close attention to your period. Tell your doctor about any changes, such as: ? Increased blood flow. This may require you to use more pads or tampons than usual per month. ? A change in the number of days that your period lasts per month. ? A change in symptoms that come with your period, such as back pain or cramping in your belly area (abdomen). Contact a doctor if:  You have pain in your back or the area between your hip bones (pelvic area) that is not controlled by medicines.  You have pain in your abdomen that is not controlled with medicines.  You have an increase in bleeding between and during periods.  You soak tampons or pads in a half hour or less.  You feel lightheaded.  You feel extra tired.  You feel weak. Get help right away if:  You pass out (faint).  You have a sudden increase in pelvic pain. This information is not intended to replace advice given to you by your health care provider. Make sure you discuss any questions you have with your health care provider. Document Released: 09/26/2010 Document Revised: 04/24/2016 Document Reviewed: 02/20/2014 Elsevier Interactive Patient Education  2018 Elsevier Inc.  

## 2018-04-30 ENCOUNTER — Other Ambulatory Visit: Payer: Self-pay | Admitting: Internal Medicine

## 2018-05-16 ENCOUNTER — Ambulatory Visit
Admission: RE | Admit: 2018-05-16 | Discharge: 2018-05-16 | Disposition: A | Discharge status: Home / Self Care | Payer: BLUE CROSS/BLUE SHIELD | Source: Ambulatory Visit | Attending: Internal Medicine | Admitting: Internal Medicine

## 2018-05-16 DIAGNOSIS — Z1231 Encounter for screening mammogram for malignant neoplasm of breast: Secondary | ICD-10-CM | POA: Diagnosis not present

## 2018-05-20 DIAGNOSIS — Z713 Dietary counseling and surveillance: Secondary | ICD-10-CM | POA: Diagnosis not present

## 2018-05-24 ENCOUNTER — Encounter: Payer: Self-pay | Admitting: Gynecology

## 2018-05-24 ENCOUNTER — Other Ambulatory Visit: Payer: Self-pay | Admitting: Women's Health

## 2018-05-24 ENCOUNTER — Ambulatory Visit: Payer: BLUE CROSS/BLUE SHIELD | Admitting: Gynecology

## 2018-05-24 ENCOUNTER — Ambulatory Visit (INDEPENDENT_AMBULATORY_CARE_PROVIDER_SITE_OTHER): Payer: BLUE CROSS/BLUE SHIELD

## 2018-05-24 VITALS — BP 124/80

## 2018-05-24 DIAGNOSIS — N840 Polyp of corpus uteri: Secondary | ICD-10-CM | POA: Diagnosis not present

## 2018-05-24 DIAGNOSIS — D259 Leiomyoma of uterus, unspecified: Secondary | ICD-10-CM

## 2018-05-24 DIAGNOSIS — R109 Unspecified abdominal pain: Secondary | ICD-10-CM

## 2018-05-24 NOTE — Addendum Note (Signed)
Addended by: Nelva Nay on: 05/24/2018 12:33 PM   Modules accepted: Orders

## 2018-05-24 NOTE — Patient Instructions (Addendum)
Office will call with biopsy results from the ultrasound and to arrange for the hysteroscopy D&C.

## 2018-05-24 NOTE — Progress Notes (Signed)
    Mckenzie Hebert 1956/08/21 [id number]        62 y.o.  G1P0010 presents for sonohysterogram.  Mckenzie Hebert for right-sided pain and had an ultrasound which showed several small myomas and a prominent endometrium with a solid-appearing focus measuring 12 x 7 x 9 mm.  Right and left ovaries were normal with scattered microcalcifications.  Describes pain as nagging right sided to right back.  No urinary symptoms.  Does have issues with constipation that she has been followed by the gastroenterologist for.  Past medical history,surgical history, problem list, medications, allergies, family history and social history were all reviewed and documented in the EPIC chart.  Directed ROS with pertinent positives and negatives documented in the history of present illness/assessment and plan.  Exam: Mckenzie Hebert assistant BP 124/80 General appearance:  Normal Abdomen soft nontender without masses guarding rebound Pelvic external BUS vagina normal.  Cervix normal.  Uterus difficult to palpate without gross masses or tenderness  Ultrasound: Transvaginal and transabdominal shows uterus normal size with several myomas noted as measured previously 6 x 6 mm, 12 x 11 mm, 14 x 11 mm and 44 x 20 x 41 mm.  Endometrial echo 4.6 mm with cystic change noted.  Right and left ovaries grossly normal.  Small area of questionable hydrosalpinx on the left measuring 18 x 8 mm clear simple not noted on previous ultrasound.  Sonohysterogram: Performed under sterile technique, single-tooth tenaculum anterior lip stabilization required for catheter placement.  Good distention with 9 x 6 mm anterior endometrial polyp with cystic change.  Endometrial sample taken.  Patient tolerated well  Assessment/Plan:  [name] with history of right-sided pain.  Ultrasound showed several small myomas and an echogenic endometrium.  Follow-up sonohysterogram confirms endometrial polyp.  Questionable small hydrosalpinx on the left today which  was not noted on her August ultrasound.  Benign in appearance and at this point do not feel needs further evaluation.  She does have a past history of endometrial polyps status post resection in the past by Dr. Cherylann Hebert.  We discussed that I think her right-sided pain is either GI related or possible musculoskeletal.  Do not think it is of GYN etiology given the small myomas.  She has done no bleeding but does have evidence of a polyp.  The issue as to what to do with an asymptomatic polyp.  She will follow-up for the endometrial biopsy and at this point my recommendation would be to undergo hysteroscopy D&C and resect the polyp to rule out hyperplastic/premalignant changes.  Patient agrees with this and will move towards scheduling the hysteroscopy D&C and she will represent for a preoperative appointment before hand.    Mckenzie Auerbach MD, 11:57 AM 05/24/2018

## 2018-05-26 ENCOUNTER — Ambulatory Visit: Payer: BLUE CROSS/BLUE SHIELD | Admitting: Gynecology

## 2018-05-26 ENCOUNTER — Other Ambulatory Visit: Payer: BLUE CROSS/BLUE SHIELD

## 2018-05-27 ENCOUNTER — Telehealth: Payer: Self-pay

## 2018-05-27 MED ORDER — MISOPROSTOL 200 MCG PO TABS: ORAL_TABLET | ORAL | 0 refills | Status: DC

## 2018-05-27 NOTE — Telephone Encounter (Signed)
Patient returned my call. We discussed her surgery benefits and that she will have $100 copymt to facility and then ins pays 100% so no surgery prepymt due here based on that information.  She chose Oct 1 at 9:00am at [facility]. I will mail her a pamphlet from the [facility].  I reviewed with her the need for a Cytotec tablet intravaginally hs night before the surgery. I warned her she might see some light bleeding/cramping but that just means tablet is working and no worries.  Rx sent to her pharmacy.

## 2018-05-30 ENCOUNTER — Other Ambulatory Visit: Payer: Self-pay | Admitting: Internal Medicine

## 2018-06-01 ENCOUNTER — Encounter: Payer: Self-pay | Admitting: Gynecology

## 2018-06-01 ENCOUNTER — Ambulatory Visit: Payer: BLUE CROSS/BLUE SHIELD | Admitting: Gynecology

## 2018-06-01 VITALS — BP 124/82

## 2018-06-01 DIAGNOSIS — N840 Polyp of corpus uteri: Secondary | ICD-10-CM | POA: Diagnosis not present

## 2018-06-01 NOTE — Patient Instructions (Signed)
Followup for surgery as scheduled. 

## 2018-06-01 NOTE — Progress Notes (Signed)
    Mckenzie Hebert [birth year] [id number]        62 y.o.  G1P0010 presents for her preoperative visit upcoming hysteroscopy D&C resection of endometrial polyp.  The patient initially presented complaining of some right-sided pain and [name] ordered an ultrasound which showed several myomas and a solid-appearing focus within the endometrium 12 x 7 x 9 mm.  She had a follow-up ultrasound which confirmed multiple small myomas, endometrial echo 4.6 mm with some cystic change and subsequent sonohysterogram showing a 9 x 6 mm anterior endometrial polyp with cystic change.  Endometrial biopsy showed scanty inactive endometrium.  Patient has a past history of endometrial polyps resected by Dr. [name].  Past medical history,surgical history, problem list, medications, allergies, family history and social history were all reviewed and documented in the EPIC chart.  Directed ROS with pertinent positives and negatives documented in the history of present illness/assessment and plan.  Exam: Vitals:   06/01/18 1504  BP: 124/82   General appearance:  Normal HEENT normal Lungs clear Cardiac regular rate no rubs murmurs or gallops Exam 05/24/2018 Abdomen soft nontender without masses guarding rebound Pelvic external BUS vagina normal.  Cervix normal.  Uterus difficult to palpate but no gross masses or tenderness.  Adnexa without gross masses or tenderness  Assessment/Plan:  [name] with endometrial polyp on sonohysterogram for hysteroscopy D&C.  I reviewed the proposed surgery with the patient to include the expected intraoperative and postoperative courses as well as the recovery period. The use of the hysteroscope, resectoscope and the D&C portion were all discussed. The risks of surgery to include infection, prolonged antibiotics, hemorrhage necessitating transfusion and the risks of transfusion, including transfusion reaction, hepatitis, HIV, mad cow disease and other unknown entities were all  discussed understood and accepted. The risk of damage to internal organs during the procedure, either immediately recognized or delay recognized, including vagina, cervix, uterus, possible perforation causing damage to bowel, bladder, ureters, vessels and nerves necessitating major exploratory reparative surgery and future reparative surgeries including bladder repair, ureteral damage repair, bowel resection, ostomy formation was also discussed understood and accepted. The patient's questions were answered to her satisfaction and she is ready to proceed with surgery.    Anastasio Auerbach MD, 3:21 PM 06/01/2018

## 2018-06-01 NOTE — H&P (Signed)
Mckenzie Hebert 05/18/1956 [id number]   History and Physical  Chief complaint: Endometrial polyp  History of present illness: [name] presents for hysteroscopy D&C resection of endometrial polyp.  The patient initially presented complaining of some right-sided pain and Mckenzie Hebert ordered an ultrasound which showed several myomas and a solid-appearing focus within the endometrium 12 x 7 x 9 mm.  Both ovaries were visualized and normal.  She had a follow-up ultrasound which confirmed multiple small myomas, endometrial echo 4.6 mm with some cystic change and subsequent sonohysterogram showing a 9 x 6 mm anterior endometrial polyp with cystic change.  Endometrial biopsy showed scanty inactive endometrium.  Patient has a past history of endometrial polyps resected by Dr. [name].   Past Medical History:  Diagnosis Date  . AC (acromioclavicular) joint bone spurs 2008   right foot  . Bronchitis   . Colon polyps    Dr Collene Mares  . Endometrial polyp   . Migraines    Dr Catalina Gravel  . Uterine fibroid     Past Surgical History:  Procedure Laterality Date  . APPENDECTOMY    . FOOT SURGERY  2002, 2008   bi-lat , bone spurs, achilles tendon work  . HYSTEROSCOPY     endo polyp and myoma  . KNEE SURGERY  2006,2008   arthroscopic, left X 2  . RESECTION TUMOR CLAVICLE RADICAL  1985   benign  . TONSILLECTOMY      Family History  Problem Relation Age of Onset  . Diabetes Mother   . Hypertension Mother   . Heart disease Mother 68       CABG  . Cancer Father        lung cancer  . Diabetes Sister   . Hypertension Sister   . Thyroid disease Sister        hyperthyroidism  . Breast cancer Paternal Aunt   . Hypertension Sister   . Autoimmune disease Sister   . Leukemia Paternal Grandmother   . Mental retardation Other     Social History:  reports that she has quit smoking. She has never used smokeless tobacco. She reports that she does not drink alcohol or use drugs.  Allergies: No Known  Allergies  Medications: See epic for most current list  ROS:  Was performed and pertinent positives and negatives are included in the history of present illness.  Exam: Vitals:   06/01/18 1504  BP: 124/82   General appearance:  Normal HEENT normal Lungs clear Cardiac regular rate no rubs murmurs or gallops Exam 05/24/2018 Sallee Lange assist Abdomen soft nontender without masses guarding rebound Pelvic external BUS vagina normal.  Cervix normal.  Uterus difficult to palpate but no gross masses or tenderness.  Adnexa without gross masses or tenderness   Assessment/Plan:  [name] with endometrial polyp on sonohysterogram for hysteroscopy D&C.  I reviewed the proposed surgery with the patient to include the expected intraoperative and postoperative courses as well as the recovery period. The use of the hysteroscope, resectoscope and the D&C portion were all discussed. The risks of surgery to include infection, prolonged antibiotics, hemorrhage necessitating transfusion and the risks of transfusion, including transfusion reaction, hepatitis, HIV, mad cow disease and other unknown entities were all discussed understood and accepted. The risk of damage to internal organs during the procedure, either immediately recognized or delay recognized, including vagina, cervix, uterus, possible perforation causing damage to bowel, bladder, ureters, vessels and nerves necessitating major exploratory reparative surgery and future reparative surgeries including bladder  repair, ureteral damage repair, bowel resection, ostomy formation was also discussed understood and accepted. The patient's questions were answered to her satisfaction and she is ready to proceed with surgery.    Anastasio Auerbach MD, 3:43 PM 06/01/2018

## 2018-06-02 ENCOUNTER — Encounter (HOSPITAL_BASED_OUTPATIENT_CLINIC_OR_DEPARTMENT_OTHER): Payer: Self-pay | Admitting: *Deleted

## 2018-06-03 ENCOUNTER — Telehealth: Payer: Self-pay

## 2018-06-03 NOTE — Telephone Encounter (Signed)
Patient called because has not yet heard from [facility] with pre op instructions. She is wondering about her medications and taking them that morning of surgery. I staff messaged Denise at [facility] and asked them to call her.  I left message for patient assuring her that her surgery is scheduled and someone will call her from [facility].

## 2018-06-06 ENCOUNTER — Encounter (HOSPITAL_BASED_OUTPATIENT_CLINIC_OR_DEPARTMENT_OTHER): Payer: Self-pay | Admitting: *Deleted

## 2018-06-06 ENCOUNTER — Other Ambulatory Visit: Payer: Self-pay

## 2018-06-06 NOTE — Progress Notes (Signed)
Spoke with patient via telephone for pre op interview. NPO after MN. Patient to take protonix and synthroid AM of surgery with a sip of water. Patient to arrive at 0700.

## 2018-06-07 ENCOUNTER — Other Ambulatory Visit: Payer: Self-pay

## 2018-06-07 ENCOUNTER — Ambulatory Visit (HOSPITAL_BASED_OUTPATIENT_CLINIC_OR_DEPARTMENT_OTHER): Payer: BLUE CROSS/BLUE SHIELD | Admitting: Anesthesiology

## 2018-06-07 ENCOUNTER — Ambulatory Visit (HOSPITAL_BASED_OUTPATIENT_CLINIC_OR_DEPARTMENT_OTHER)
Admission: RE | Admit: 2018-06-07 | Discharge: 2018-06-07 | Disposition: A | Discharge status: Home / Self Care | Payer: BLUE CROSS/BLUE SHIELD | Source: Ambulatory Visit | Attending: Gynecology | Admitting: Gynecology

## 2018-06-07 ENCOUNTER — Encounter (HOSPITAL_BASED_OUTPATIENT_CLINIC_OR_DEPARTMENT_OTHER)
Admission: RE | Disposition: A | Discharge status: Home / Self Care | Payer: Self-pay | Source: Ambulatory Visit | Attending: Gynecology

## 2018-06-07 ENCOUNTER — Encounter (HOSPITAL_BASED_OUTPATIENT_CLINIC_OR_DEPARTMENT_OTHER): Payer: Self-pay | Admitting: Emergency Medicine

## 2018-06-07 DIAGNOSIS — E119 Type 2 diabetes mellitus without complications: Secondary | ICD-10-CM | POA: Insufficient documentation

## 2018-06-07 DIAGNOSIS — N840 Polyp of corpus uteri: Secondary | ICD-10-CM | POA: Diagnosis not present

## 2018-06-07 DIAGNOSIS — D250 Submucous leiomyoma of uterus: Secondary | ICD-10-CM | POA: Insufficient documentation

## 2018-06-07 DIAGNOSIS — Z87891 Personal history of nicotine dependence: Secondary | ICD-10-CM | POA: Diagnosis not present

## 2018-06-07 DIAGNOSIS — E039 Hypothyroidism, unspecified: Secondary | ICD-10-CM | POA: Insufficient documentation

## 2018-06-07 DIAGNOSIS — D259 Leiomyoma of uterus, unspecified: Secondary | ICD-10-CM | POA: Diagnosis not present

## 2018-06-07 DIAGNOSIS — Z79899 Other long term (current) drug therapy: Secondary | ICD-10-CM | POA: Diagnosis not present

## 2018-06-07 DIAGNOSIS — Z6841 Body Mass Index (BMI) 40.0 and over, adult: Secondary | ICD-10-CM | POA: Insufficient documentation

## 2018-06-07 HISTORY — DX: Personal history of colonic polyps: Z86.010

## 2018-06-07 HISTORY — DX: Prediabetes: R73.03

## 2018-06-07 HISTORY — PX: DILATATION & CURETTAGE/HYSTEROSCOPY WITH MYOSURE: SHX6511

## 2018-06-07 HISTORY — DX: Personal history of colon polyps, unspecified: Z86.0100

## 2018-06-07 HISTORY — DX: Nausea with vomiting, unspecified: R11.2

## 2018-06-07 HISTORY — DX: Palpitations: R00.2

## 2018-06-07 HISTORY — DX: Other specified postprocedural states: Z98.890

## 2018-06-07 LAB — POCT I-STAT, CHEM 8
BUN: 18 mg/dL (ref 8–23)
Calcium, Ion: 1.2 mmol/L (ref 1.15–1.40)
Chloride: 104 mmol/L (ref 98–111)
Creatinine, Ser: 0.7 mg/dL (ref 0.44–1.00)
Glucose, Bld: 111 mg/dL — ABNORMAL HIGH (ref 70–99)
HCT: 34 % — ABNORMAL LOW (ref 36.0–46.0)
Hemoglobin: 11.6 g/dL — ABNORMAL LOW (ref 12.0–15.0)
Potassium: 4.1 mmol/L (ref 3.5–5.1)
Sodium: 139 mmol/L (ref 135–145)
TCO2: 26 mmol/L (ref 22–32)

## 2018-06-07 SURGERY — DILATATION & CURETTAGE/HYSTEROSCOPY WITH MYOSURE
Anesthesia: General

## 2018-06-07 MED ORDER — ONDANSETRON HCL 4 MG/2ML IJ SOLN
INTRAMUSCULAR | Status: AC
  Filled 2018-06-07: qty 2

## 2018-06-07 MED ORDER — SODIUM CHLORIDE 0.9 % IV SOLN
2.0000 g | INTRAVENOUS | Status: AC
  Administered 2018-06-07: 2 g via INTRAVENOUS
  Filled 2018-06-07: qty 2

## 2018-06-07 MED ORDER — SCOPOLAMINE 1 MG/3DAYS TD PT72
1.0000 | MEDICATED_PATCH | Freq: Once | TRANSDERMAL | Status: DC
  Administered 2018-06-07: 1.5 mg via TRANSDERMAL
  Filled 2018-06-07: qty 1

## 2018-06-07 MED ORDER — KETOROLAC TROMETHAMINE 30 MG/ML IJ SOLN
INTRAMUSCULAR | Status: DC | PRN
  Administered 2018-06-07: 30 mg via INTRAVENOUS

## 2018-06-07 MED ORDER — LACTATED RINGERS IV SOLN
INTRAVENOUS | Status: DC
  Administered 2018-06-07 (×2): via INTRAVENOUS
  Filled 2018-06-07: qty 1000

## 2018-06-07 MED ORDER — KETOROLAC TROMETHAMINE 30 MG/ML IJ SOLN
INTRAMUSCULAR | Status: AC
  Filled 2018-06-07: qty 1

## 2018-06-07 MED ORDER — FENTANYL CITRATE (PF) 100 MCG/2ML IJ SOLN
INTRAMUSCULAR | Status: AC
  Filled 2018-06-07: qty 2

## 2018-06-07 MED ORDER — DEXAMETHASONE SODIUM PHOSPHATE 10 MG/ML IJ SOLN
INTRAMUSCULAR | Status: AC
  Filled 2018-06-07: qty 1

## 2018-06-07 MED ORDER — SODIUM CHLORIDE 0.9 % IR SOLN
Status: DC | PRN
  Administered 2018-06-07: 3000 mL

## 2018-06-07 MED ORDER — ONDANSETRON HCL 4 MG/2ML IJ SOLN
INTRAMUSCULAR | Status: DC | PRN
  Administered 2018-06-07: 4 mg via INTRAVENOUS

## 2018-06-07 MED ORDER — MIDAZOLAM HCL 5 MG/5ML IJ SOLN
INTRAMUSCULAR | Status: DC | PRN
  Administered 2018-06-07: 2 mg via INTRAVENOUS

## 2018-06-07 MED ORDER — FENTANYL CITRATE (PF) 100 MCG/2ML IJ SOLN
25.0000 ug | INTRAMUSCULAR | Status: DC | PRN
  Filled 2018-06-07: qty 1

## 2018-06-07 MED ORDER — ACETAMINOPHEN 500 MG PO TABS
ORAL_TABLET | ORAL | Status: AC
  Filled 2018-06-07: qty 2

## 2018-06-07 MED ORDER — ONDANSETRON HCL 4 MG/2ML IJ SOLN
4.0000 mg | Freq: Once | INTRAMUSCULAR | Status: DC | PRN
  Filled 2018-06-07: qty 2

## 2018-06-07 MED ORDER — ACETAMINOPHEN 500 MG PO TABS
1000.0000 mg | ORAL_TABLET | Freq: Once | ORAL | Status: AC
  Administered 2018-06-07: 1000 mg via ORAL
  Filled 2018-06-07: qty 2

## 2018-06-07 MED ORDER — LIDOCAINE 2% (20 MG/ML) 5 ML SYRINGE
INTRAMUSCULAR | Status: DC | PRN
  Administered 2018-06-07: 100 mg via INTRAVENOUS

## 2018-06-07 MED ORDER — FENTANYL CITRATE (PF) 100 MCG/2ML IJ SOLN
INTRAMUSCULAR | Status: DC | PRN
  Administered 2018-06-07 (×2): 50 ug via INTRAVENOUS

## 2018-06-07 MED ORDER — LIDOCAINE 2% (20 MG/ML) 5 ML SYRINGE
INTRAMUSCULAR | Status: AC
  Filled 2018-06-07: qty 5

## 2018-06-07 MED ORDER — MIDAZOLAM HCL 2 MG/2ML IJ SOLN
INTRAMUSCULAR | Status: AC
  Filled 2018-06-07: qty 2

## 2018-06-07 MED ORDER — DEXAMETHASONE SODIUM PHOSPHATE 4 MG/ML IJ SOLN
INTRAMUSCULAR | Status: DC | PRN
  Administered 2018-06-07: 10 mg via INTRAVENOUS

## 2018-06-07 MED ORDER — LIDOCAINE HCL 1 % IJ SOLN
INTRAMUSCULAR | Status: DC | PRN
  Administered 2018-06-07: 10 mL

## 2018-06-07 MED ORDER — SCOPOLAMINE 1 MG/3DAYS TD PT72
MEDICATED_PATCH | TRANSDERMAL | Status: AC
  Filled 2018-06-07: qty 1

## 2018-06-07 MED ORDER — PROPOFOL 10 MG/ML IV BOLUS
INTRAVENOUS | Status: DC | PRN
  Administered 2018-06-07: 200 mg via INTRAVENOUS

## 2018-06-07 MED ORDER — PROPOFOL 10 MG/ML IV BOLUS
INTRAVENOUS | Status: AC
  Filled 2018-06-07: qty 20

## 2018-06-07 MED ORDER — OXYCODONE-ACETAMINOPHEN 5-325 MG PO TABS: 1.0000 | ORAL_TABLET | ORAL | 0 refills | Status: DC | PRN

## 2018-06-07 MED ORDER — SODIUM CHLORIDE 0.9 % IV SOLN
INTRAVENOUS | Status: AC
  Filled 2018-06-07: qty 2

## 2018-06-07 SURGICAL SUPPLY — 23 items
CANISTER SUCT 3000ML PPV (MISCELLANEOUS) ×3 IMPLANT
CATH ROBINSON RED A/P 16FR (CATHETERS) ×3 IMPLANT
CLOTH BEACON ORANGE TIMEOUT ST (SAFETY) ×3 IMPLANT
COUNTER NEEDLE 1200 MAGNETIC (NEEDLE) ×1 IMPLANT
DEVICE MYOSURE LITE (MISCELLANEOUS) ×2 IMPLANT
DEVICE MYOSURE REACH (MISCELLANEOUS) IMPLANT
DILATOR CANAL MILEX (MISCELLANEOUS) IMPLANT
GAUZE SPONGE 4X4 16PLY XRAY LF (GAUZE/BANDAGES/DRESSINGS) ×3 IMPLANT
GLOVE BIO SURGEON STRL SZ7.5 (GLOVE) ×6 IMPLANT
GOWN STRL REUS W/TWL LRG LVL3 (GOWN DISPOSABLE) ×3 IMPLANT
IV NS IRRIG 3000ML ARTHROMATIC (IV SOLUTION) ×3 IMPLANT
KIT PROCEDURE FLUENT (KITS) ×3 IMPLANT
KIT TURNOVER CYSTO (KITS) ×3 IMPLANT
MYOSURE XL FIBROID REM (MISCELLANEOUS) ×3
NDL SAFETY ECLIPSE 18X1.5 (NEEDLE) IMPLANT
NEEDLE HYPO 18GX1.5 SHARP (NEEDLE)
PACK VAGINAL MINOR WOMEN LF (CUSTOM PROCEDURE TRAY) ×3 IMPLANT
PAD OB MATERNITY 4.3X12.25 (PERSONAL CARE ITEMS) ×3 IMPLANT
SEAL ROD LENS SCOPE MYOSURE (ABLATOR) ×3 IMPLANT
SYRINGE LUER LOK 1CC (MISCELLANEOUS) IMPLANT
SYSTEM TISS REMOVAL MYSR XL RM (MISCELLANEOUS) IMPLANT
TOWEL OR 17X24 6PK STRL BLUE (TOWEL DISPOSABLE) ×6 IMPLANT
WATER STERILE IRR 500ML POUR (IV SOLUTION) IMPLANT

## 2018-06-07 NOTE — Op Note (Signed)
Mckenzie Hebert 06/28/1956 [id number]   Post Operative Note   Date of surgery:  06/07/2018  Pre Op Dx: Endometrial polyp  Post Op Dx: Endometrial polyp, submucous myoma  Procedure: Hysteroscopy, D&C, mild sure resection endometrial polyp and submucous myoma  Surgeon:  Belinda Block Fontaine  Anesthesia:  General  EBL: 10 cc  Distended media discrepancy: 433 cc saline  Complications:  None  Specimen: #1 endometrial curetting #2 endometrial polyp and myoma fragments to pathology  Findings: EUA: External BUS vagina with atrophic changes.  Cervix with atrophic changes.  Uterus grossly normal size midline mobile.  Adnexa without gross masses   Hysteroscopy: Adequate noting fundus, anterior/posterior endometrial surfaces, right/left tubal ostia, lower uterine segment and endocervical canal visualized.  2 endometrial polyps from anterior upper left and upper right endometrial cavity kissing in the midline.  Resected in their entirety to the level of the surrounding endometrium.  Small submucous myoma midline fundal with approximately 60-80% resected to the level of the surrounding endometrium.  Procedure: The patient was taken to the operating room, was placed in the low dorsal lithotomy position, underwent general anesthesia, received a perineal/vaginal preparation per nursing personnel and the bladder was emptied with an in and out Foley catheterization. The timeout was performed by the surgical team. An EUA was performed. The patient was draped in the usual fashion. The cervix was visualized with a speculum, anterior lip grasped with a single-tooth tenaculum and a paracervical block was placed using 10 cc's of 1% lidocaine. The cervix was gently dilated to admit the Myosure hysteroscope and hysteroscopy was performed with findings noted above. Using the Myosure Lite resectoscopic wand the polyps were resected in their entirety to the level the surrounding endometrium.  After removal of the polyps a  submucous myoma was visualized and initial attempts at resection were difficult due to the dense nature of the myoma.  The MyoSure hysteroscope was replaced with the XL hysteroscope and the XL resectoscopic wand and the submucous myoma was resected through several passes of decompression and re-expansion of the cavity until approximately 20 to 40% of the myoma remained flush with the endometrial surface unable to be further resected.  A gentle sharp curettage was performed. Both specimens were sent separately to pathology.  The instruments were removed and adequate hemostasis was visualized at the tenaculum site and external cervical os.  The specimens were identified for pathology.  The sponge, needle and instrument count were verified correct.  The patient was given intraoperative Toradol, was awakened without difficulty and was taken to the recovery room in good condition having tolerated the procedure well.    Anastasio Auerbach MD, 10:00 AM 06/07/2018

## 2018-06-07 NOTE — H&P (Signed)
The patient was examined.  I reviewed the proposed surgery and consent form with the patient.  The dictated history and physical is current and accurate and all questions were answered. The patient is ready to proceed with surgery and has a realistic understanding and expectation for the outcome.   Anastasio Auerbach MD, 8:47 AM 06/07/2018

## 2018-06-07 NOTE — Discharge Instructions (Signed)
° °  Postoperative Instructions Hysteroscopy D & C  Dr. Phineas Real and the nursing staff have discussed postoperative instructions with you.  If you have any questions please ask them before you leave the hospital, or call Dr Elisabeth Most office at [phone].    We would like to emphasize the following instructions:   ? Call the office to make your follow-up appointment as recommended by Dr Phineas Real (usually 1-2 weeks).  ? You were given a prescription, or one was ordered for you at the pharmacy you designated.  Get that prescription filled and take the medication according to instructions.  ? You may eat a regular diet, but slowly until you start having bowel movements.  ? Drink plenty of water daily.  ? Nothing in the vagina (intercourse, douching, objects of any kind) for two weeks.  When reinitiating intercourse, if it is uncomfortable, stop and make an appointment with Dr Phineas Real to be evaluated.  ? No driving for one to two days until the effects of anesthesia has worn off.  No traveling out of town for several days.  ? You may shower, but no baths for one week.  Walking up and down stairs is ok.  No heavy lifting, prolonged standing, repeated bending or any working out until your post op check.  ? Rest frequently, listen to your body and do not push yourself and overdo it.  ? Call if:  o Your pain medication does not seem strong enough. o Worsening pain or abdominal bloating o Persistent nausea or vomiting o Difficulty with urination or bowel movements. o Temperature of 101 degrees or higher. o Heavy vaginal bleeding.  If your period is due, you may use tampons. o You have any questions or concerns  Next dose Motrin,Aleve after 3 pm as needed  Post Anesthesia Home Care Instructions  Activity: Get plenty of rest for the remainder of the day. A responsible individual must stay with you for 24 hours following the procedure.  For the next 24 hours, DO NOT: -Drive a  car -Paediatric nurse -Drink alcoholic beverages -Take any medication unless instructed by your physician -Make any legal decisions or sign important papers.  Meals: Start with liquid foods such as gelatin or soup. Progress to regular foods as tolerated. Avoid greasy, spicy, heavy foods. If nausea and/or vomiting occur, drink only clear liquids until the nausea and/or vomiting subsides. Call your physician if vomiting continues.  Special Instructions/Symptoms: Your throat may feel dry or sore from the anesthesia or the breathing tube placed in your throat during surgery. If this causes discomfort, gargle with warm salt water. The discomfort should disappear within 24 hours.  If you had a scopolamine patch placed behind your ear for the management of post- operative nausea and/or vomiting:  1. The medication in the patch is effective for 72 hours, after which it should be removed.  Wrap patch in a tissue and discard in the trash. Wash hands thoroughly with soap and water. 2. You may remove the patch earlier than 72 hours if you experience unpleasant side effects which may include dry mouth, dizziness or visual disturbances. 3. Avoid touching the patch. Wash your hands with soap and water after contact with the patch.

## 2018-06-07 NOTE — Anesthesia Preprocedure Evaluation (Addendum)
Anesthesia Evaluation  Patient identified by MRN, date of birth, ID band Patient awake    Reviewed: Allergy & Precautions, NPO status , Patient's Chart, lab work & pertinent test results  History of Anesthesia Complications (+) PONV and history of anesthetic complications  Airway Mallampati: III  TM Distance: >3 FB Neck ROM: Full    Dental  (+) Teeth Intact, Dental Advisory Given, Missing   Pulmonary asthma , former smoker,    Pulmonary exam normal breath sounds clear to auscultation       Cardiovascular Normal cardiovascular exam+ dysrhythmias (Palpitations)  Rhythm:Regular Rate:Normal     Neuro/Psych  Headaches, PSYCHIATRIC DISORDERS Anxiety    GI/Hepatic Neg liver ROS, GERD  Medicated,  Endo/Other  diabetes, Type 2Hypothyroidism Morbid obesity  Renal/GU negative Renal ROS     Musculoskeletal negative musculoskeletal ROS (+)   Abdominal   Peds  Hematology  (+) Blood dyscrasia, anemia ,   Anesthesia Other Findings Day of surgery medications reviewed with the patient.  Reproductive/Obstetrics endometrial polyp                           Anesthesia Physical Anesthesia Plan  ASA: III  Anesthesia Plan: General   Post-op Pain Management:    Induction: Intravenous  PONV Risk Score and Plan: 4 or greater and Scopolamine patch - Pre-op, Midazolam, Dexamethasone, Ondansetron and Diphenhydramine  Airway Management Planned: LMA  Additional Equipment:   Intra-op Plan:   Post-operative Plan: Extubation in OR  Informed Consent: I have reviewed the patients History and Physical, chart, labs and discussed the procedure including the risks, benefits and alternatives for the proposed anesthesia with the patient or authorized representative who has indicated his/her understanding and acceptance.   Dental advisory given  Plan Discussed with: CRNA  Anesthesia Plan Comments:         Anesthesia Quick Evaluation

## 2018-06-07 NOTE — Transfer of Care (Signed)
Last Vitals:  Vitals Value Taken Time  BP    Temp    Pulse 83 06/07/2018 10:02 AM  Resp 16 06/07/2018 10:02 AM  SpO2 98 % 06/07/2018 10:02 AM  Vitals shown include unvalidated device data.  Last Pain:  Vitals:   06/07/18 0700  TempSrc: Oral  PainSc: 1          Immediate Anesthesia Transfer of Care Note  Patient: Mckenzie Hebert  Procedure(s) Performed: Procedure(s) (LRB): DILATATION & CURETTAGE/HYSTEROSCOPY WITH MYOSURE (N/A)  Patient Location: PACU  Anesthesia Type: General  Level of Consciousness: awake, alert  and oriented  Airway & Oxygen Therapy: Patient Spontanous Breathing and Patient connected to nasal cannula oxygen  Post-op Assessment: Report given to PACU RN and Post -op Vital signs reviewed and stable  Post vital signs: Reviewed and stable  Complications: No apparent anesthesia complications

## 2018-06-07 NOTE — Anesthesia Procedure Notes (Signed)
Procedure Name: LMA Insertion Date/Time: 06/07/2018 9:06 AM Performed by: Catalina Gravel, MD Pre-anesthesia Checklist: Patient identified, Emergency Drugs available, Suction available and Patient being monitored Patient Re-evaluated:Patient Re-evaluated prior to induction Oxygen Delivery Method: Circle system utilized Preoxygenation: Pre-oxygenation with 100% oxygen Induction Type: IV induction Ventilation: Mask ventilation without difficulty LMA: LMA inserted LMA Size: 4.0 Number of attempts: 1 Airway Equipment and Method: Bite block Placement Confirmation: positive ETCO2 Tube secured with: Tape Dental Injury: Teeth and Oropharynx as per pre-operative assessment

## 2018-06-08 ENCOUNTER — Encounter (HOSPITAL_BASED_OUTPATIENT_CLINIC_OR_DEPARTMENT_OTHER): Payer: Self-pay | Admitting: Gynecology

## 2018-06-08 NOTE — Anesthesia Postprocedure Evaluation (Signed)
Anesthesia Post Note  Patient: Mckenzie Hebert  Procedure(s) Performed: DILATATION & CURETTAGE/HYSTEROSCOPY WITH MYOSURE (N/A )     Patient location during evaluation: PACU Anesthesia Type: General Level of consciousness: awake and alert, awake and oriented Pain management: pain level controlled Vital Signs Assessment: post-procedure vital signs reviewed and stable Respiratory status: spontaneous breathing, nonlabored ventilation and respiratory function stable Cardiovascular status: blood pressure returned to baseline and stable Postop Assessment: no apparent nausea or vomiting Anesthetic complications: no    Last Vitals:  Vitals:   06/07/18 1030 06/07/18 1146  BP: 120/79 138/82  Pulse: 73 62  Resp: 14 16  Temp:  36.7 C  SpO2: 93% 100%    Last Pain:  Vitals:   06/08/18 0958  TempSrc:   PainSc: 0-No pain                 Catalina Gravel

## 2018-06-24 ENCOUNTER — Ambulatory Visit (INDEPENDENT_AMBULATORY_CARE_PROVIDER_SITE_OTHER): Payer: BLUE CROSS/BLUE SHIELD | Admitting: Gynecology

## 2018-06-24 ENCOUNTER — Encounter: Payer: Self-pay | Admitting: Gynecology

## 2018-06-24 VITALS — BP 134/80

## 2018-06-24 DIAGNOSIS — Z9889 Other specified postprocedural states: Secondary | ICD-10-CM

## 2018-06-24 NOTE — Progress Notes (Signed)
    Mckenzie Hebert 1956/06/04 [id number]        62 y.o.  G1P0010 presents for her postoperative visit status post hysteroscopic resection benign endometrial polyp and leiomyoma.  Has done well since without bleeding or pain.  Past medical history,surgical history, problem list, medications, allergies, family history and social history were all reviewed and documented in the EPIC chart.  Directed ROS with pertinent positives and negatives documented in the history of present illness/assessment and plan.  Exam: Mckenzie Hebert assistant Vitals:   06/24/18 1527  BP: 134/80   General appearance:  Normal Abdomen soft nontender without gross masses or tenderness Pelvic external BUS vagina with atrophic changes.  Cervix with atrophic changes.  Uterus grossly normal midline mobile nontender.  Adnexa without masses or tenderness.  Assessment/Plan:  [name] with normal postoperative visit status post hysteroscopic resection of benign endometrial polyps.  Reviewed pictures from the surgery as well as pathology.  Patient will follow-up if any further bleeding.  Otherwise she will follow-up for annual gynecologic exam beginning of this coming year.    Mckenzie Auerbach MD, 3:42 PM 06/24/2018

## 2018-06-24 NOTE — Patient Instructions (Signed)
Follow-up for your annual gynecologic exam after the beginning of next year.

## 2018-07-07 ENCOUNTER — Other Ambulatory Visit: Payer: Self-pay | Admitting: Internal Medicine

## 2018-07-12 ENCOUNTER — Other Ambulatory Visit: Payer: Self-pay | Admitting: Internal Medicine

## 2018-07-13 ENCOUNTER — Encounter: Payer: BLUE CROSS/BLUE SHIELD | Admitting: Women's Health

## 2018-07-14 ENCOUNTER — Other Ambulatory Visit: Payer: Self-pay | Admitting: Internal Medicine

## 2018-07-14 DIAGNOSIS — R7302 Impaired glucose tolerance (oral): Secondary | ICD-10-CM

## 2018-07-14 DIAGNOSIS — E039 Hypothyroidism, unspecified: Secondary | ICD-10-CM

## 2018-07-14 DIAGNOSIS — E7800 Pure hypercholesterolemia, unspecified: Secondary | ICD-10-CM

## 2018-07-18 ENCOUNTER — Other Ambulatory Visit: Payer: BLUE CROSS/BLUE SHIELD | Admitting: Internal Medicine

## 2018-07-18 DIAGNOSIS — E039 Hypothyroidism, unspecified: Secondary | ICD-10-CM

## 2018-07-18 DIAGNOSIS — E7800 Pure hypercholesterolemia, unspecified: Secondary | ICD-10-CM

## 2018-07-18 DIAGNOSIS — R7302 Impaired glucose tolerance (oral): Secondary | ICD-10-CM

## 2018-07-19 ENCOUNTER — Ambulatory Visit: Payer: BLUE CROSS/BLUE SHIELD | Admitting: Internal Medicine

## 2018-07-19 ENCOUNTER — Encounter: Payer: Self-pay | Admitting: Internal Medicine

## 2018-07-19 VITALS — BP 102/88 | HR 86 | Temp 98.2°F | Ht 67.0 in | Wt 266.0 lb

## 2018-07-19 DIAGNOSIS — E039 Hypothyroidism, unspecified: Secondary | ICD-10-CM | POA: Diagnosis not present

## 2018-07-19 DIAGNOSIS — R7302 Impaired glucose tolerance (oral): Secondary | ICD-10-CM

## 2018-07-19 DIAGNOSIS — G4700 Insomnia, unspecified: Secondary | ICD-10-CM

## 2018-07-19 DIAGNOSIS — Z6841 Body Mass Index (BMI) 40.0 and over, adult: Secondary | ICD-10-CM

## 2018-07-19 DIAGNOSIS — F419 Anxiety disorder, unspecified: Secondary | ICD-10-CM

## 2018-07-19 DIAGNOSIS — E782 Mixed hyperlipidemia: Secondary | ICD-10-CM | POA: Diagnosis not present

## 2018-07-19 LAB — TSH: TSH: 3.9 mIU/L (ref 0.40–4.50)

## 2018-07-19 LAB — HEMOGLOBIN A1C
Hgb A1c MFr Bld: 6.3 % of total Hgb — ABNORMAL HIGH (ref <5.7)
Mean Plasma Glucose: 134 (calc)
eAG (mmol/L): 7.4 (calc)

## 2018-07-19 LAB — LIPID PANEL
Cholesterol: 238 mg/dL — ABNORMAL HIGH (ref <200)
HDL: 54 mg/dL (ref >50)
LDL Cholesterol (Calc): 153 mg/dL (calc) — ABNORMAL HIGH
Non-HDL Cholesterol (Calc): 184 mg/dL (calc) — ABNORMAL HIGH (ref <130)
Total CHOL/HDL Ratio: 4.4 (calc) (ref <5.0)
Triglycerides: 169 mg/dL — ABNORMAL HIGH (ref <150)

## 2018-07-19 LAB — MICROALBUMIN / CREATININE URINE RATIO
Creatinine, Urine: 105 mg/dL (ref 20–275)
Microalb Creat Ratio: 4 mcg/mg creat (ref <30)
Microalb, Ur: 0.4 mg/dL

## 2018-07-19 MED ORDER — LEVOTHYROXINE SODIUM 150 MCG PO TABS: 150.0000 ug | ORAL_TABLET | Freq: Every day | ORAL | 1 refills | Status: DC

## 2018-07-19 NOTE — Progress Notes (Signed)
   Subjective:    Patient ID: Mckenzie Hebert, female    DOB: 04/30/1956, 62 y.o.   MRN: [id number]  HPI 62 year old Female for follow up mixed hyperlipidemia, impaired glucose tolerance and hypothyroidism, anxiety and insomnia.  This is her six-month follow-up visit.  Hemoglobin A1c has decreased from 6.6% 6 months ago to 6.3%.  Total cholesterol has increased from 218 to 238 Triglycerides have increased from 154 to 169.  LDL cholesterol was 141 6 months ago and is now 153.  HDL is 54.  TSH is 3.90.    Review of Systems see above     Objective:   Physical Exam  Skin warm and dry.  No thyromegaly.  No carotid bruits.  Chest clear to auscultation.  Cardiac exam regular rate and rhythm normal S1 and S2.  Extremities without edema.  Affect is normal. Blood pressure is stable at 102/88.  BMI is 41.66.  Weight is 266 pounds.     Assessment & Plan:  Anxiety-continue with Xanax 1 mg twice daily as needed.  This was refilled  Mixed hyperlipidemia- patient will take Crestor 5 mg 3 times weekly and follow-up in about 2 months with lipid panel liver functions and office visit.  Hypothyroidism-continue same dose of thyroid replacement  Insomnia-continue to take Xanax at night if necessary  GE reflux treated with Protonix  History of asthma treated with Proventil  Plan: Continue current medications as previously prescribed and add Crestor 5 mg 3 times weekly with follow-up in January.

## 2018-07-21 ENCOUNTER — Ambulatory Visit: Payer: BLUE CROSS/BLUE SHIELD | Admitting: Internal Medicine

## 2018-07-27 DIAGNOSIS — M1712 Unilateral primary osteoarthritis, left knee: Secondary | ICD-10-CM | POA: Diagnosis not present

## 2018-07-27 DIAGNOSIS — M25562 Pain in left knee: Secondary | ICD-10-CM | POA: Insufficient documentation

## 2018-07-31 ENCOUNTER — Other Ambulatory Visit: Payer: Self-pay | Admitting: Internal Medicine

## 2018-07-31 NOTE — Patient Instructions (Addendum)
Continue to work on diet and exercise which will help lipids and glucose control.  May want to consider Dr. Migdalia Dk clinic for weight loss.  Start Crestor 5 mg 3 times weekly and follow-up in January.

## 2018-08-15 ENCOUNTER — Other Ambulatory Visit: Payer: Self-pay

## 2018-08-15 MED ORDER — ALBUTEROL SULFATE HFA 108 (90 BASE) MCG/ACT IN AERS: 2.0000 | INHALATION_SPRAY | RESPIRATORY_TRACT | 11 refills | Status: DC | PRN

## 2018-08-16 DIAGNOSIS — M1712 Unilateral primary osteoarthritis, left knee: Secondary | ICD-10-CM | POA: Diagnosis not present

## 2018-08-16 DIAGNOSIS — M25561 Pain in right knee: Secondary | ICD-10-CM | POA: Diagnosis not present

## 2018-09-12 ENCOUNTER — Other Ambulatory Visit: Payer: BLUE CROSS/BLUE SHIELD | Admitting: Internal Medicine

## 2018-09-12 DIAGNOSIS — E782 Mixed hyperlipidemia: Secondary | ICD-10-CM | POA: Diagnosis not present

## 2018-09-12 DIAGNOSIS — R7302 Impaired glucose tolerance (oral): Secondary | ICD-10-CM

## 2018-09-12 DIAGNOSIS — E039 Hypothyroidism, unspecified: Secondary | ICD-10-CM | POA: Diagnosis not present

## 2018-09-12 NOTE — Addendum Note (Signed)
Addended by: Mady Haagensen on: 09/12/2018 09:16 AM   Modules accepted: Orders

## 2018-09-13 ENCOUNTER — Other Ambulatory Visit: Payer: Self-pay | Admitting: Internal Medicine

## 2018-09-13 ENCOUNTER — Encounter: Payer: Self-pay | Admitting: Internal Medicine

## 2018-09-13 ENCOUNTER — Ambulatory Visit: Payer: BLUE CROSS/BLUE SHIELD | Admitting: Internal Medicine

## 2018-09-13 VITALS — BP 110/70 | HR 75 | Ht 67.0 in | Wt 274.0 lb

## 2018-09-13 DIAGNOSIS — E039 Hypothyroidism, unspecified: Secondary | ICD-10-CM

## 2018-09-13 DIAGNOSIS — E782 Mixed hyperlipidemia: Secondary | ICD-10-CM | POA: Diagnosis not present

## 2018-09-13 DIAGNOSIS — Z6841 Body Mass Index (BMI) 40.0 and over, adult: Secondary | ICD-10-CM

## 2018-09-13 DIAGNOSIS — E119 Type 2 diabetes mellitus without complications: Secondary | ICD-10-CM

## 2018-09-13 DIAGNOSIS — F419 Anxiety disorder, unspecified: Secondary | ICD-10-CM

## 2018-09-13 DIAGNOSIS — F4329 Adjustment disorder with other symptoms: Secondary | ICD-10-CM | POA: Diagnosis not present

## 2018-09-13 LAB — HEMOGLOBIN A1C
Hgb A1c MFr Bld: 6.4 % of total Hgb — ABNORMAL HIGH (ref <5.7)
Mean Plasma Glucose: 137 (calc)
eAG (mmol/L): 7.6 (calc)

## 2018-09-13 LAB — LIPID PANEL
Cholesterol: 181 mg/dL (ref <200)
HDL: 75 mg/dL (ref >50)
LDL Cholesterol (Calc): 91 mg/dL (calc)
Non-HDL Cholesterol (Calc): 106 mg/dL (calc) (ref <130)
Total CHOL/HDL Ratio: 2.4 (calc) (ref <5.0)
Triglycerides: 64 mg/dL (ref <150)

## 2018-09-13 LAB — HEPATIC FUNCTION PANEL
AG Ratio: 2 (calc) (ref 1.0–2.5)
ALT: 22 U/L (ref 6–29)
AST: 20 U/L (ref 10–35)
Albumin: 4.4 g/dL (ref 3.6–5.1)
Alkaline phosphatase (APISO): 60 U/L (ref 33–130)
Bilirubin, Direct: 0.1 mg/dL (ref 0.0–0.2)
Globulin: 2.2 g/dL (calc) (ref 1.9–3.7)
Indirect Bilirubin: 0.3 mg/dL (calc) (ref 0.2–1.2)
Total Bilirubin: 0.4 mg/dL (ref 0.2–1.2)
Total Protein: 6.6 g/dL (ref 6.1–8.1)

## 2018-09-13 LAB — TEST AUTHORIZATION

## 2018-09-13 LAB — TSH: TSH: 3.37 mIU/L (ref 0.40–4.50)

## 2018-09-13 NOTE — Progress Notes (Signed)
   Subjective:    Patient ID: Mckenzie Hebert, female    DOB: 1956/04/16, 63 y.o.   MRN: [id number]  HPI  63 year old Female for follow up of hypothyroidism, glucose intolerance, and lipids. Hgb AIC is slightly elevated.In November, we increased thyroid med to 150 mcgs daily. TSH needs to be added to labs drawn previously    Review of Systems no new complaints- still has anxiety at times     Objective:   Physical Exam Skin warm and dry.  Nodes none.  Neck is supple without JVD thyromegaly or carotid bruits.  Chest clear to auscultation.  Cardiac exam regular rate and rhythm normal S1 and S2.  Extremities without pitting edema.  Vital signs reviewed and are stable.       Assessment & Plan:  Hypothyroidism- In November dose was increased to 150 mcgs daily.  TSH is 3.37. Increase levothyroxine to 175 mcgs daily and follow up with TSH in 6 weeks without OV.  Hyperlipidemia- excellent response to 3 times a week statin  Liver functions stable on statin  Anxiety depression- this time of year is hard for her- anniversary reaction  Controlled type 2 diabetes- reminded to watch diet.  BMI 42.91 continue to work on diet and exercise  Plan: CPE due in May 2020- need to book appt

## 2018-09-13 NOTE — Patient Instructions (Signed)
TSH to be added.  Good response with statin 3 times a week.  Anniversary reaction in October 14, 2022 due to remote death of husband and mother in 10/14/22.  Support given.  Continue to work on diet and exercise.  CPE due May 2020 and needs to be booked.

## 2018-09-15 ENCOUNTER — Other Ambulatory Visit: Payer: Self-pay

## 2018-09-15 MED ORDER — LEVOTHYROXINE SODIUM 175 MCG PO TABS: 175.0000 ug | ORAL_TABLET | Freq: Every day | ORAL | 1 refills | Status: DC

## 2018-09-15 NOTE — Addendum Note (Signed)
Addended by: Mady Haagensen on: 09/15/2018 03:23 PM   Modules accepted: Orders

## 2018-10-27 ENCOUNTER — Other Ambulatory Visit: Payer: BLUE CROSS/BLUE SHIELD | Admitting: Internal Medicine

## 2018-10-27 DIAGNOSIS — E039 Hypothyroidism, unspecified: Secondary | ICD-10-CM | POA: Diagnosis not present

## 2018-10-28 LAB — TSH: TSH: 0.85 mIU/L (ref 0.40–4.50)

## 2018-11-06 HISTORY — PX: HAMMER TOE SURGERY: SHX385

## 2018-11-07 ENCOUNTER — Other Ambulatory Visit: Payer: Self-pay

## 2018-11-07 MED ORDER — LEVOTHYROXINE SODIUM 175 MCG PO TABS: 175.0000 ug | ORAL_TABLET | Freq: Every day | ORAL | 3 refills | Status: DC

## 2018-11-08 ENCOUNTER — Other Ambulatory Visit: Payer: Self-pay | Admitting: Internal Medicine

## 2018-11-09 ENCOUNTER — Ambulatory Visit: Payer: BLUE CROSS/BLUE SHIELD | Admitting: Podiatry

## 2018-11-09 ENCOUNTER — Ambulatory Visit (INDEPENDENT_AMBULATORY_CARE_PROVIDER_SITE_OTHER): Payer: BLUE CROSS/BLUE SHIELD

## 2018-11-09 ENCOUNTER — Encounter: Payer: Self-pay | Admitting: Podiatry

## 2018-11-09 DIAGNOSIS — M2042 Other hammer toe(s) (acquired), left foot: Secondary | ICD-10-CM

## 2018-11-09 NOTE — Progress Notes (Signed)
Subjective:   Patient ID: Mckenzie Hebert, female   DOB: 63 y.o.   MRN: [id number]   HPI Patient presents with very painful left fifth digit that is hard to wear shoe gear with that is been going on for a number of years and at this point she would like to get it corrected.  Patient overall good health and did have a plantar fibroma excision which did okay but still is painful at times.  Patient does not smoke likes to be active   Review of Systems  All other systems reviewed and are negative.       Objective:  Physical Exam Vitals signs and nursing note reviewed.  Constitutional:      Appearance: She is well-developed.  Pulmonary:     Effort: Pulmonary effort is normal.  Musculoskeletal: Normal range of motion.  Skin:    General: Skin is warm.  Neurological:     Mental Status: She is alert.     Neurovascular status intact muscle strength is adequate range of motion within normal limits with patient found to have fifth digit left a distal lateral keratotic lesion with severe rotation of the toe with extreme pain when I palpated with patient having inability to be active or wear shoe gear due to the pain it creates and is only able to wear certain types of shoes and even those become very painful.  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  Patient wants this done and I have recommended arthroplasty with removal of the pathological tissue and bone resection and she wants to get this done soon.  At this point I allowed her to read consent form going over all possible complications alternative treatments and she understands no guarantee as far success of surgery and that all complications can occur want surgery signed consent form after extensive review and is scheduled  for outpatient procedure.  This will be done in the office due to cost and reduced expense for the patient and I gave her all instructions on the procedure.  Encouraged her to call with any questions she may have  X-ray indicates severe rotation digit 5 left with pathology of the middle phalanx signed visit

## 2018-11-29 DIAGNOSIS — E039 Hypothyroidism, unspecified: Secondary | ICD-10-CM | POA: Diagnosis not present

## 2018-11-29 DIAGNOSIS — R7303 Prediabetes: Secondary | ICD-10-CM | POA: Diagnosis not present

## 2018-11-29 DIAGNOSIS — R7302 Impaired glucose tolerance (oral): Secondary | ICD-10-CM | POA: Diagnosis not present

## 2018-11-30 ENCOUNTER — Ambulatory Visit (INDEPENDENT_AMBULATORY_CARE_PROVIDER_SITE_OTHER): Payer: BLUE CROSS/BLUE SHIELD | Admitting: Podiatry

## 2018-11-30 ENCOUNTER — Telehealth: Payer: Self-pay | Admitting: Podiatry

## 2018-11-30 ENCOUNTER — Encounter: Payer: Self-pay | Admitting: Podiatry

## 2018-11-30 ENCOUNTER — Other Ambulatory Visit: Payer: Self-pay

## 2018-11-30 VITALS — BP 144/89 | HR 74 | Temp 97.6°F | Resp 16

## 2018-11-30 DIAGNOSIS — M2042 Other hammer toe(s) (acquired), left foot: Secondary | ICD-10-CM

## 2018-11-30 MED ORDER — HYDROCODONE-ACETAMINOPHEN 10-325 MG PO TABS: 1.0000 | ORAL_TABLET | Freq: Three times a day (TID) | ORAL | 0 refills | Status: DC | PRN

## 2018-11-30 NOTE — Telephone Encounter (Signed)
Called and left a message for the patient that RX was called in. Mckenzie Hebert

## 2018-11-30 NOTE — Telephone Encounter (Signed)
Patient had office surgery this morning and went to her pharmacy to pick up the prescription but they state that they have not received the prescription. Pt wanting to follow up because she lives out of town and would like to pick it up before going home.

## 2018-11-30 NOTE — Progress Notes (Signed)
Subjective:   Patient ID: Mckenzie Hebert, female   DOB: 63 y.o.   MRN: [id number]   HPI Patient presents with painful fifth digit left stating she is ready to have surgery on this   ROS      Objective:  Physical Exam  Neurovascular status intact with patient found to have keratotic lesion distal lateral aspect digit 5 left with history of surgery on this toe that was not successful     Assessment:  Hammertoe deformity fifth left distal joint     Plan:  H&P and patient was anesthetized with 60 mg like Marcaine mixture and the patient was brought to the OR placed in supine position and sterile prep applied to the foot.  The patient had sterile dressing and sterile draping done and then had Ace wrap applied to exsanguinate the foot and the tourniquet was inflated 250 mmHg.  Following procedure was performed attention directed to the dorsal aspect digit 5 left were semi-elliptical transverse incision was made at the distal interphalangeal joint.  Medial lateral collateral ligament to the distal interphalangeal joint was severed and the head of the middle phalanx and a small amount of the distal phalanx was removed from the wound and flushed.  I then went ahead and placed the toe in a derotated position and sutured with 5-0 nylon and I instructed on postoperative dressing which was applied.  Tourniquet was released capillary fill noted immediate all digits on the foot patient tolerated surgery well and was escorted out of the OR in satisfactory condition with surgical shoe

## 2018-12-01 ENCOUNTER — Encounter: Payer: Self-pay | Admitting: Podiatry

## 2018-12-02 ENCOUNTER — Encounter: Payer: Self-pay | Admitting: Podiatry

## 2018-12-02 ENCOUNTER — Ambulatory Visit (INDEPENDENT_AMBULATORY_CARE_PROVIDER_SITE_OTHER): Payer: BLUE CROSS/BLUE SHIELD | Admitting: Podiatry

## 2018-12-02 ENCOUNTER — Other Ambulatory Visit: Payer: Self-pay

## 2018-12-02 VITALS — Temp 97.6°F

## 2018-12-02 DIAGNOSIS — M2042 Other hammer toe(s) (acquired), left foot: Secondary | ICD-10-CM

## 2018-12-02 DIAGNOSIS — T148XXA Other injury of unspecified body region, initial encounter: Secondary | ICD-10-CM

## 2018-12-02 MED ORDER — CEPHALEXIN 500 MG PO CAPS: 500.0000 mg | ORAL_CAPSULE | Freq: Three times a day (TID) | ORAL | 0 refills | Status: DC

## 2018-12-06 NOTE — Progress Notes (Signed)
Subjective: Mckenzie Hebert is a 63 y.o. is seen today in office s/p left fifth toe surgery preformed on 11/30/2018.  She presents exhibiting bleeding at the sock.  She denies any recent injury or trauma but she does admit that she may been on her foot too much yesterday.  She is been wearing a surgical shoe. Denies any systemic complaints such as fevers, chills, nausea, vomiting. No calf pain, chest pain, shortness of breath.   Objective: General: No acute distress, AAOx3  DP/PT pulses palpable 2/4, CRT < 3 sec to all digits.  LEFT foot: Incision is well coapted without any evidence of dehiscence.  There is a blister present on the plantar aspect of the toe there is localized edema and erythema.  I think this erythema is more from inflammation as opposed to infection.  There is no active bleeding identified but there was bloody drainage on the bandage.  No purulence.  No ascending cellulitis.   No other areas of tenderness to bilateral lower extremities.  No other open lesions or pre-ulcerative lesions.  No pain with calf compression, swelling, warmth, erythema.   Assessment and Plan:  Status post left fifth toe surgery, presents due to bleeding as well as blister on the toe  -Treatment options discussed including all alternatives, risks, and complications -There is no active bleeding identified but there is blood on the bandage.  I did clean the skin with alcohol and I did drain the blister that was present on the plantar aspect of the toe.  Clear blister fluid was removed.  No purulence.  Betadine was applied to the area followed by dry sterile dressing.  Given the blister as well as the edema and erythema ordered start on Keflex.  This was prescribed today. -Continue with surgical shoe today. -Ice/elevation -Pain medication as needed. -Monitor for any clinical signs or symptoms of infection and DVT/PE and directed to call the office immediately should any occur or go to the ER. -Follow-up as  scheduled with Dr. Paulla Dolly or sooner if any problems arise. In the meantime, encouraged to call the office with any questions, concerns, change in symptoms.   Celesta Gentile, DPM

## 2018-12-14 ENCOUNTER — Ambulatory Visit (INDEPENDENT_AMBULATORY_CARE_PROVIDER_SITE_OTHER): Payer: BLUE CROSS/BLUE SHIELD

## 2018-12-14 ENCOUNTER — Ambulatory Visit (INDEPENDENT_AMBULATORY_CARE_PROVIDER_SITE_OTHER): Payer: BLUE CROSS/BLUE SHIELD | Admitting: Podiatry

## 2018-12-14 ENCOUNTER — Encounter: Payer: Self-pay | Admitting: Podiatry

## 2018-12-14 ENCOUNTER — Other Ambulatory Visit: Payer: Self-pay

## 2018-12-14 VITALS — Temp 97.9°F

## 2018-12-14 DIAGNOSIS — Z09 Encounter for follow-up examination after completed treatment for conditions other than malignant neoplasm: Secondary | ICD-10-CM

## 2018-12-14 DIAGNOSIS — M2042 Other hammer toe(s) (acquired), left foot: Secondary | ICD-10-CM

## 2018-12-14 NOTE — Progress Notes (Signed)
Subjective:   Patient ID: Mckenzie Hebert, female   DOB: 63 y.o.   MRN: [id number]   HPI Patient states she is doing great with surgery with minimal discomfort and very pleased so far with stitches intact   ROS      Objective:  Physical Exam  Neurovascular status intact with stitches intact left fifth digit distal with good alignment of the toe and no indications of pathology     Assessment:  Doing well post distal arthroplasty digit 5 left with good alignment noted     Plan:  Stitches removed wound edges coapted well instructed on continued open toed shoes and gradual return to soft shoe in the next couple weeks and will be seen back again to recheck  X-ray indicates satisfactory removal of bone of the distal fifth digit left

## 2018-12-28 ENCOUNTER — Encounter: Payer: BLUE CROSS/BLUE SHIELD | Admitting: Podiatry

## 2019-01-24 ENCOUNTER — Encounter: Payer: Self-pay | Admitting: Internal Medicine

## 2019-01-24 DIAGNOSIS — H25813 Combined forms of age-related cataract, bilateral: Secondary | ICD-10-CM | POA: Diagnosis not present

## 2019-01-24 DIAGNOSIS — E119 Type 2 diabetes mellitus without complications: Secondary | ICD-10-CM | POA: Diagnosis not present

## 2019-01-24 DIAGNOSIS — H04123 Dry eye syndrome of bilateral lacrimal glands: Secondary | ICD-10-CM | POA: Diagnosis not present

## 2019-01-24 LAB — HM DIABETES EYE EXAM

## 2019-01-26 ENCOUNTER — Other Ambulatory Visit: Payer: Self-pay

## 2019-01-26 ENCOUNTER — Other Ambulatory Visit: Payer: BLUE CROSS/BLUE SHIELD | Admitting: Internal Medicine

## 2019-01-26 DIAGNOSIS — E782 Mixed hyperlipidemia: Secondary | ICD-10-CM | POA: Diagnosis not present

## 2019-01-26 DIAGNOSIS — E039 Hypothyroidism, unspecified: Secondary | ICD-10-CM | POA: Diagnosis not present

## 2019-01-26 DIAGNOSIS — E559 Vitamin D deficiency, unspecified: Secondary | ICD-10-CM | POA: Diagnosis not present

## 2019-01-26 DIAGNOSIS — E119 Type 2 diabetes mellitus without complications: Secondary | ICD-10-CM

## 2019-01-26 DIAGNOSIS — F419 Anxiety disorder, unspecified: Secondary | ICD-10-CM

## 2019-01-26 DIAGNOSIS — Z0000 Encounter for general adult medical examination without abnormal findings: Secondary | ICD-10-CM

## 2019-01-27 LAB — VITAMIN D 25 HYDROXY (VIT D DEFICIENCY, FRACTURES): Vit D, 25-Hydroxy: 28 ng/mL — ABNORMAL LOW (ref 30–100)

## 2019-01-27 LAB — CBC WITH DIFFERENTIAL/PLATELET
Absolute Monocytes: 384 cells/uL (ref 200–950)
Basophils Absolute: 39 cells/uL (ref 0–200)
Basophils Relative: 0.6 %
Eosinophils Absolute: 241 cells/uL (ref 15–500)
Eosinophils Relative: 3.7 %
HCT: 38.9 % (ref 35.0–45.0)
Hemoglobin: 13 g/dL (ref 11.7–15.5)
Lymphs Abs: 1118 cells/uL (ref 850–3900)
MCH: 26.4 pg — ABNORMAL LOW (ref 27.0–33.0)
MCHC: 33.4 g/dL (ref 32.0–36.0)
MCV: 78.9 fL — ABNORMAL LOW (ref 80.0–100.0)
MPV: 10 fL (ref 7.5–12.5)
Monocytes Relative: 5.9 %
Neutro Abs: 4719 cells/uL (ref 1500–7800)
Neutrophils Relative %: 72.6 %
Platelets: 269 10*3/uL (ref 140–400)
RBC: 4.93 10*6/uL (ref 3.80–5.10)
RDW: 13.8 % (ref 11.0–15.0)
Total Lymphocyte: 17.2 %
WBC: 6.5 10*3/uL (ref 3.8–10.8)

## 2019-01-27 LAB — COMPLETE METABOLIC PANEL WITH GFR
AG Ratio: 1.6 (calc) (ref 1.0–2.5)
ALT: 29 U/L (ref 6–29)
AST: 24 U/L (ref 10–35)
Albumin: 4.4 g/dL (ref 3.6–5.1)
Alkaline phosphatase (APISO): 61 U/L (ref 37–153)
BUN: 13 mg/dL (ref 7–25)
CO2: 26 mmol/L (ref 20–32)
Calcium: 9.3 mg/dL (ref 8.6–10.4)
Chloride: 103 mmol/L (ref 98–110)
Creat: 0.78 mg/dL (ref 0.50–0.99)
GFR, Est African American: 94 mL/min/{1.73_m2} (ref >=60)
GFR, Est Non African American: 81 mL/min/{1.73_m2} (ref >=60)
Globulin: 2.7 g/dL (calc) (ref 1.9–3.7)
Glucose, Bld: 150 mg/dL — ABNORMAL HIGH (ref 65–99)
Potassium: 4.6 mmol/L (ref 3.5–5.3)
Sodium: 139 mmol/L (ref 135–146)
Total Bilirubin: 0.4 mg/dL (ref 0.2–1.2)
Total Protein: 7.1 g/dL (ref 6.1–8.1)

## 2019-01-27 LAB — LIPID PANEL
Cholesterol: 161 mg/dL (ref <200)
HDL: 50 mg/dL (ref >=50)
LDL Cholesterol (Calc): 88 mg/dL (calc)
Non-HDL Cholesterol (Calc): 111 mg/dL (calc) (ref <130)
Total CHOL/HDL Ratio: 3.2 (calc) (ref <5.0)
Triglycerides: 135 mg/dL (ref <150)

## 2019-01-27 LAB — HEMOGLOBIN A1C
Hgb A1c MFr Bld: 6.9 % of total Hgb — ABNORMAL HIGH (ref <5.7)
Mean Plasma Glucose: 151 (calc)
eAG (mmol/L): 8.4 (calc)

## 2019-01-27 LAB — MICROALBUMIN / CREATININE URINE RATIO
Creatinine, Urine: 141 mg/dL (ref 20–275)
Microalb Creat Ratio: 4 mcg/mg creat (ref <30)
Microalb, Ur: 0.5 mg/dL

## 2019-01-27 LAB — TSH: TSH: 1.97 mIU/L (ref 0.40–4.50)

## 2019-02-02 ENCOUNTER — Other Ambulatory Visit: Payer: Self-pay | Admitting: Internal Medicine

## 2019-02-03 ENCOUNTER — Encounter: Payer: Self-pay | Admitting: Internal Medicine

## 2019-02-03 ENCOUNTER — Ambulatory Visit (INDEPENDENT_AMBULATORY_CARE_PROVIDER_SITE_OTHER): Payer: BLUE CROSS/BLUE SHIELD | Admitting: Internal Medicine

## 2019-02-03 VITALS — BP 140/90 | HR 67 | Temp 97.8°F | Ht 67.0 in | Wt 282.0 lb

## 2019-02-03 DIAGNOSIS — E559 Vitamin D deficiency, unspecified: Secondary | ICD-10-CM

## 2019-02-03 DIAGNOSIS — E8881 Metabolic syndrome: Secondary | ICD-10-CM

## 2019-02-03 DIAGNOSIS — K219 Gastro-esophageal reflux disease without esophagitis: Secondary | ICD-10-CM

## 2019-02-03 DIAGNOSIS — E119 Type 2 diabetes mellitus without complications: Secondary | ICD-10-CM | POA: Diagnosis not present

## 2019-02-03 DIAGNOSIS — K5904 Chronic idiopathic constipation: Secondary | ICD-10-CM

## 2019-02-03 DIAGNOSIS — G4700 Insomnia, unspecified: Secondary | ICD-10-CM

## 2019-02-03 DIAGNOSIS — E782 Mixed hyperlipidemia: Secondary | ICD-10-CM

## 2019-02-03 DIAGNOSIS — Z6841 Body Mass Index (BMI) 40.0 and over, adult: Secondary | ICD-10-CM

## 2019-02-03 DIAGNOSIS — Z0000 Encounter for general adult medical examination without abnormal findings: Secondary | ICD-10-CM | POA: Diagnosis not present

## 2019-02-03 DIAGNOSIS — E039 Hypothyroidism, unspecified: Secondary | ICD-10-CM

## 2019-02-03 DIAGNOSIS — Z8709 Personal history of other diseases of the respiratory system: Secondary | ICD-10-CM

## 2019-02-03 LAB — POCT URINALYSIS DIPSTICK
Appearance: NEGATIVE
Bilirubin, UA: NEGATIVE
Blood, UA: NEGATIVE
Glucose, UA: NEGATIVE
Ketones, UA: NEGATIVE
Leukocytes, UA: NEGATIVE
Nitrite, UA: NEGATIVE
Odor: NEGATIVE
Protein, UA: NEGATIVE
Spec Grav, UA: 1.015 (ref 1.010–1.025)
Urobilinogen, UA: 0.2 E.U./dL
pH, UA: 6.5 (ref 5.0–8.0)
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
## 2019-02-13 ENCOUNTER — Other Ambulatory Visit: Payer: Self-pay

## 2019-02-13 MED ORDER — LEVOTHYROXINE SODIUM 175 MCG PO TABS: 175.0000 ug | ORAL_TABLET | Freq: Every day | ORAL | 3 refills | Status: DC

## 2019-02-13 MED ORDER — ROSUVASTATIN CALCIUM 5 MG PO TABS: 5.0000 mg | ORAL_TABLET | Freq: Every day | ORAL | 1 refills | Status: DC

## 2019-03-04 NOTE — Patient Instructions (Signed)
It was a pleasure to see you today.  Please take care of yourself.  Work on diet and exercise.  Continue same medications.  Follow-up in 6 months.

## 2019-03-06 ENCOUNTER — Telehealth: Payer: Self-pay | Admitting: Internal Medicine

## 2019-03-06 NOTE — Telephone Encounter (Signed)
No, she is at risk for Covid in these areas

## 2019-03-06 NOTE — Telephone Encounter (Signed)
Schedule office visit next week to discuss.

## 2019-03-06 NOTE — Telephone Encounter (Signed)
LVM to CB and schedule appointment for next week to talk about the note as to when you can wear your mask.

## 2019-03-06 NOTE — Telephone Encounter (Signed)
Mckenzie Hebert [id number]  [name] called to see if she could get a note for work to where she could not have to wear her mask while she is at her desk and not in the common areas at work. I let her know we may need virtual visit or office visit to discuss.

## 2019-03-06 NOTE — Telephone Encounter (Signed)
She wants to wear in the common areas, just not at her desk, sorry I wrote that wrong. I suggest virtual visit or office visit to discuss.

## 2019-03-06 NOTE — Telephone Encounter (Signed)
Elmer Bales and scheduled appointment

## 2019-03-13 ENCOUNTER — Encounter: Payer: Self-pay | Admitting: Internal Medicine

## 2019-03-13 ENCOUNTER — Other Ambulatory Visit: Payer: Self-pay

## 2019-03-13 ENCOUNTER — Ambulatory Visit: Payer: BC Managed Care – PPO | Admitting: Internal Medicine

## 2019-03-13 VITALS — BP 120/80 | HR 84 | Ht 67.0 in | Wt 272.0 lb

## 2019-03-13 DIAGNOSIS — F411 Generalized anxiety disorder: Secondary | ICD-10-CM | POA: Diagnosis not present

## 2019-03-26 ENCOUNTER — Encounter: Payer: Self-pay | Admitting: Internal Medicine

## 2019-03-26 MED ORDER — ALPRAZOLAM 1 MG PO TABS: 1.0000 mg | ORAL_TABLET | Freq: Three times a day (TID) | ORAL | 5 refills | Status: DC
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
## 2019-03-29 NOTE — Patient Instructions (Signed)
Call if anxiety symptoms regarding wearing a mask continue.  Otherwise continue to leave your mask on at your work.

## 2019-04-10 ENCOUNTER — Other Ambulatory Visit: Payer: Self-pay | Admitting: Internal Medicine

## 2019-04-10 DIAGNOSIS — Z1231 Encounter for screening mammogram for malignant neoplasm of breast: Secondary | ICD-10-CM

## 2019-04-19 ENCOUNTER — Other Ambulatory Visit: Payer: Self-pay | Admitting: Internal Medicine

## 2019-04-20 DIAGNOSIS — M25562 Pain in left knee: Secondary | ICD-10-CM | POA: Diagnosis not present

## 2019-05-03 NOTE — Progress Notes (Signed)
Cardiology Office Note   Date:  05/04/2019   ID:  KRISTYL FANTA, DOB May 14, 1956, MRN [id number]  PCP:  Elby Showers, MD  Cardiologist:   No primary care provider on file.   No chief complaint on file.     History of Present Illness: Mckenzie Hebert is a 63 y.o. female who presents for follow up of palpitations.  I saw her last year for palpitations.   She had a normal Lexiscan Myoview in 2017 and a Holter in 2015 with mild ectopy.  She does have ectopy as noted previously on her monitor that is relatively frequent.  In fact today she had bigeminy and trigeminy with some PACs and probable PVCs.  She feels these palpitations but they are not more problematic than they were.  She is not having any presyncope or syncope.  She thinks to get worse with stress and she is stressed because of the pandemic.  She denies any chest pressure, neck or arm discomfort.  She has no shortness of breath, PND or orthopnea.   Past Medical History:  Diagnosis Date  . AC (acromioclavicular) joint bone spurs 2008   right foot  . Endometrial polyp   . Endometrial polyp   . History of colon polyps   . Migraines    Dr Catalina Gravel  . Palpitations    cardiologist-  dr Genisis Sonnier-- normal myoview 2017, holter 2015 mild ectopy  . PONV (postoperative nausea and vomiting)   . Pre-diabetes 2019  . Uterine fibroid     Past Surgical History:  Procedure Laterality Date  . APPENDECTOMY    . CARDIOVASCULAR STRESS TEST  10-11-2015   dr Jarmal Lewelling   normal nuclear study w/ no ischemia/  normal LV function and wall motion, nuclear stress ef 64%  . D & C HYSTERSCOPY W/ RESECTION POLYP  01-16-2010   dr gottsegen  @ [facility]  . DILATATION & CURETTAGE/HYSTEROSCOPY WITH MYOSURE N/A 06/07/2018   Procedure: DILATATION & CURETTAGE/HYSTEROSCOPY WITH MYOSURE;  Surgeon: Anastasio Auerbach, MD;  Location: [facility];  Service: Gynecology;  Laterality: N/A;  request to follow first case in [place] block time   requests one hour  . FOOT SURGERY  2002, 2008   bi-lat , bone spurs, achilles tendon work  . RESECTION TUMOR CLAVICLE RADICAL  1985   benign  . TONSILLECTOMY       Current Outpatient Medications  Medication Sig Dispense Refill  . albuterol (VENTOLIN HFA) 108 (90 Base) MCG/ACT inhaler INHALE 2 PUFFS INTO THE LUNGS FOUR TIMES DAILY AS NEEDED 36 g prn  . ALPRAZolam (XANAX) 1 MG tablet Take 1 tablet (1 mg total) by mouth 3 (three) times daily. (Patient taking differently: Take 1 mg by mouth at bedtime as needed. ) 90 tablet 5  . Cholecalciferol (VITAMIN D3 PO) Take 4,000 Int'l Units by mouth as needed.     . clobetasol ointment (TEMOVATE) [id number] % Apply 1 application topically 2 (two) times daily. (Patient taking differently: Apply 1 application topically as needed. ) 30 g 0  . clotrimazole-betamethasone (LOTRISONE) cream Apply 1 application topically 2 (two) times daily. (Patient taking differently: Apply 1 application topically as needed. ) 30 g 2  . cyclobenzaprine (FLEXERIL) 10 MG tablet Take 1 tablet (10 mg total) by mouth 3 (three) times daily as needed. (Patient taking differently: Take 10 mg by mouth as needed. ) 30 tablet 0  . levothyroxine (SYNTHROID) 175 MCG tablet Take 1 tablet (175 mcg total) by mouth  daily before breakfast. Take it on an empty stomach alone. 30 tablet 3  . pantoprazole (PROTONIX) 40 MG tablet TAKE 1 TABLET BY MOUTH EVERY DAY 90 tablet 3  . promethazine (PHENERGAN) 25 MG tablet TAKE 1 TABLET BY MOUTH EVERY 12 HOURS AS NEEDED FOR NAUSEA 30 tablet 1  . rosuvastatin (CRESTOR) 5 MG tablet Take 1 tablet (5 mg total) by mouth daily. One po 3 times a week 36 tablet 1   No current facility-administered medications for this visit.     Allergies:   Patient has no known allergies.     ROS:  Please see the history of present illness.   Otherwise, review of systems are positive for none.   All other systems are reviewed and negative.    PHYSICAL EXAM: VS:  BP 120/80    Pulse 78   Temp 98.4 F (36.9 C) (Temporal)   Ht 5' 6.5" (1.689 m)   Wt 280 lb 12.8 oz (127.4 kg)   LMP 03/18/2008 (LMP Unknown)   BMI 44.64 kg/m  , BMI Body mass index is 44.64 kg/m. GENERAL:  Well appearing HEENT:  Pupils equal round and reactive, fundi not visualized, oral mucosa unremarkable NECK:  No jugular venous distention, waveform within normal limits, carotid upstroke brisk and symmetric, no bruits, no thyromegaly LYMPHATICS:  No cervical, inguinal adenopathy LUNGS:  Clear to auscultation bilaterally BACK:  No CVA tenderness CHEST:  Unremarkable HEART:  PMI not displaced or sustained,S1 and S2 within normal limits, no S3, no S4, no clicks, no rubs, no murmurs ABD:  Flat, positive bowel sounds normal in frequency in pitch, no bruits, no rebound, no guarding, no midline pulsatile mass, no hepatomegaly, no splenomegaly EXT:  2 plus pulses throughout, no edema, no cyanosis no clubbing SKIN:  No rashes no nodules NEURO:  Cranial nerves II through XII grossly intact, motor grossly intact throughout PSYCH:  Cognitively intact, oriented to person place and time   EKG:  EKG is  ordered today. The ekg ordered today demonstrates sinus rhythm, rate 78, axis within normal limits, intervals within normal limits, no acute ST-T wave changes.  Premature ventricular contractions and atrial contractions.   Recent Labs: 01/26/2019: ALT 29; BUN 13; Creat 0.78; Hemoglobin 13.0; Platelets 269; Potassium 4.6; Sodium 139; TSH 1.97    Lipid Panel    Component Value Date/Time   CHOL 161 01/26/2019 1217   TRIG 135 01/26/2019 1217   HDL 50 01/26/2019 1217   CHOLHDL 3.2 01/26/2019 1217   VLDL 17 05/12/2016 0933   LDLCALC 88 01/26/2019 1217      Wt Readings from Last 3 Encounters:  05/04/19 280 lb 12.8 oz (127.4 kg)  03/13/19 272 lb (123.4 kg)  02/03/19 282 lb (127.9 kg)      Other studies Reviewed: Additional studies/ records that were reviewed today include: None. Review of the  above records demonstrates:  Please see elsewhere in the note.     ASSESSMENT AND PLAN:  PALPITATIONS:    The patient does have ectopy.  However, she is not particularly symptomatic with this.  She had a negative perfusion study with a good ejection fraction.  Given this I do not think any further cardiovascular testing is suggested.  She has had normal blood work from previously.  CBC was normal.  Electrolytes were normal.  No change in therapy.  CHEST PAIN:   Patient is not describing chest discomfort.  No further work-up.   DM: A1c is 6.9.  She will continue the meds  as listed.     Current medicines are reviewed at length with the patient today.  The patient does not have concerns regarding medicines.  The following changes have been made:  None  Labs/ tests ordered today include: None  No orders of the defined types were placed in this encounter.    Disposition:   FU with me in 2 years.     Signed, Minus Breeding, MD  05/04/2019 5:58 PM    [place]

## 2019-05-04 ENCOUNTER — Ambulatory Visit: Payer: BC Managed Care – PPO | Admitting: Cardiology

## 2019-05-04 ENCOUNTER — Encounter: Payer: Self-pay | Admitting: Cardiology

## 2019-05-04 ENCOUNTER — Other Ambulatory Visit: Payer: Self-pay

## 2019-05-04 VITALS — BP 120/80 | HR 78 | Temp 98.4°F | Ht 66.5 in | Wt 280.8 lb

## 2019-05-04 DIAGNOSIS — R0789 Other chest pain: Secondary | ICD-10-CM

## 2019-05-04 DIAGNOSIS — I493 Ventricular premature depolarization: Secondary | ICD-10-CM | POA: Diagnosis not present

## 2019-05-04 DIAGNOSIS — R079 Chest pain, unspecified: Secondary | ICD-10-CM

## 2019-05-04 NOTE — Patient Instructions (Signed)
Medication Instructions:  Your physician recommends that you continue on your current medications as directed. Please refer to the Current Medication list given to you today.  If you need a refill on your cardiac medications before your next appointment, please call your pharmacy.   Lab work: NONE  Testing/Procedures: NONE   Follow-Up: At [facility], you and your health needs are our priority.  As part of our continuing mission to provide you with exceptional heart care, we have created designated Provider Care Teams.  These Care Teams include your primary Cardiologist (physician) and Advanced Practice Providers (APPs -  Physician Assistants and Nurse Practitioners) who all work together to provide you with the care you need, when you need it. You will need a follow up appointment in 2 years.  Please call our office 2 months in advance to schedule this appointment.  You may see DR [facility]  or one of the following Advanced Practice Providers on your designated Care Team:   Rosaria Ferries, PA-C . Jory Sims, DNP, ANP

## 2019-05-08 ENCOUNTER — Telehealth: Payer: Self-pay

## 2019-05-08 NOTE — Telephone Encounter (Signed)
What does this mean? It looks like she has been on this same dose for some time. Is it a different brand? If so, she needs to discuss that with pharmacy. She can come in and have TSH rechecked but I doubt this is causing headaches. I need more information.

## 2019-05-08 NOTE — Telephone Encounter (Signed)
Patient called states ever since we changed her thyroid medication, she has more frequent headaches, she is very irritable and has more palpitations. She wants to know if we can change her thyroid medication. She said saw her cardiologist last week and had an EKG and noted some arrhthymias.

## 2019-05-09 DIAGNOSIS — L7211 Pilar cyst: Secondary | ICD-10-CM | POA: Diagnosis not present

## 2019-05-09 DIAGNOSIS — D485 Neoplasm of uncertain behavior of skin: Secondary | ICD-10-CM | POA: Diagnosis not present

## 2019-05-10 NOTE — Telephone Encounter (Signed)
TSH ordered.

## 2019-05-11 DIAGNOSIS — L280 Lichen simplex chronicus: Secondary | ICD-10-CM | POA: Diagnosis not present

## 2019-05-12 ENCOUNTER — Other Ambulatory Visit: Payer: Self-pay

## 2019-05-16 ENCOUNTER — Encounter: Payer: Self-pay | Admitting: Women's Health

## 2019-05-16 ENCOUNTER — Ambulatory Visit: Payer: BC Managed Care – PPO | Admitting: Women's Health

## 2019-05-16 ENCOUNTER — Other Ambulatory Visit: Payer: BC Managed Care – PPO | Admitting: Internal Medicine

## 2019-05-16 ENCOUNTER — Other Ambulatory Visit: Payer: Self-pay

## 2019-05-16 VITALS — BP 130/80 | Ht 66.0 in | Wt 283.0 lb

## 2019-05-16 DIAGNOSIS — Z23 Encounter for immunization: Secondary | ICD-10-CM

## 2019-05-16 DIAGNOSIS — E039 Hypothyroidism, unspecified: Secondary | ICD-10-CM | POA: Diagnosis not present

## 2019-05-16 DIAGNOSIS — M546 Pain in thoracic spine: Secondary | ICD-10-CM | POA: Diagnosis not present

## 2019-05-16 DIAGNOSIS — Z01419 Encounter for gynecological examination (general) (routine) without abnormal findings: Secondary | ICD-10-CM | POA: Diagnosis not present

## 2019-05-16 DIAGNOSIS — M545 Low back pain: Secondary | ICD-10-CM | POA: Diagnosis not present

## 2019-05-16 NOTE — Progress Notes (Signed)
Mckenzie Hebert May 06, 1956 [id number]    History:    Presents for annual exam.  Postmenopausal on no HRT with no bleeding.  Not sexually active, widowed 2008.  Normal Pap and mammogram history.  06/2018 MyoSure ablation.  Benign endometrial polyp.  Primary care manages hypothyroidism, hypercholesteremia, prediabetes, and asthma.  2016 benign colon polyp 5-year follow-up Dr. Collene Mares.  Has not had Shingrex.  2016 normal DEXA  Past medical history, past surgical history, family history and social history were all reviewed and documented in the EPIC chart.  Works in [employer] job.    ROS:  A ROS was performed and pertinent positives and negatives are included.  Exam:  Vitals:   05/16/19 1100  BP: 130/80  Weight: 283 lb (128.4 kg)  Height: 5\' 6"  (1.676 m)   Body mass index is 45.68 kg/m.   General appearance:  Normal Thyroid:  Symmetrical, normal in size, without palpable masses or nodularity. Respiratory  Auscultation:  Clear without wheezing or rhonchi Cardiovascular  Auscultation:  Regular rate, without rubs, murmurs or gallops  Edema/varicosities:  Not grossly evident Abdominal  Soft,nontender, without masses, guarding or rebound.  Liver/spleen:  No organomegaly noted  Hernia:  None appreciated  Skin  Inspection:  Grossly normal   Breasts: Examined lying and sitting.     Right: Without masses, retractions, discharge or axillary adenopathy.     Left: Without masses, retractions, discharge or axillary adenopathy. Gentitourinary   Inguinal/mons:  Normal without inguinal adenopathy  External genitalia:  Normal  BUS/Urethra/Skene's glands:  Normal  Vagina:  Normal  Cervix:  Normal  Uterus:  normal in size, shape and contour.  Midline and mobile  Adnexa/parametria:     Rt: Without masses or tenderness.   Lt: Without masses or tenderness.  Anus and perineum: Normal  Digital rectal exam: Normal sphincter tone without palpated masses or  tenderness  Assessment/Plan:  63 y.o. W WF G1, P0 for annual exam with no complaints.  Postmenopausal/no HRT/no bleeding Hypothyroidism, hypercholesteremia, prediabetes,-primary care manages labs and meds Obesity 06/2018-  ablation for benign endometrial polyp 2016 benign colon polyp 5-year follow-up Osteoarthritis left knee-follow-up with orthopedist  Plan: Reviewed importance of weightbearing and balance type exercise, repeat DEXA next year.  Home safety and fall prevention discussed.  Fell 2 weeks ago in the shower no fractures.  SBEs, continue annual screening mammogram, calcium rich foods, vitamin D 2000 daily encouraged.  Aware of need to keep scheduled follow-up 2021 for colonoscopy.  Shingrix reviewed and encouraged.  Flu vaccine given today.  Aware of need to decrease calorie/carbs for weight loss.  Pap normal 2018, new screening guidelines reviewed.  [place], 11:32 AM 05/16/2019

## 2019-05-16 NOTE — Patient Instructions (Addendum)
shingrex vaccine  2 shots and get at pharmacy  Carbohydrate Counting for Diabetes Mellitus, Adult  Carbohydrate counting is a method of keeping track of how many carbohydrates you eat. Eating carbohydrates naturally increases the amount of sugar (glucose) in the blood. Counting how many carbohydrates you eat helps keep your blood glucose within normal limits, which helps you manage your diabetes (diabetes mellitus). It is important to know how many carbohydrates you can safely have in each meal. This is different for every person. A diet and nutrition specialist (registered dietitian) can help you make a meal plan and calculate how many carbohydrates you should have at each meal and snack. Carbohydrates are found in the following foods:  Grains, such as breads and cereals.  Dried beans and soy products.  Starchy vegetables, such as potatoes, peas, and corn.  Fruit and fruit juices.  Milk and yogurt.  Sweets and snack foods, such as cake, cookies, candy, chips, and soft drinks. How do I count carbohydrates? There are two ways to count carbohydrates in food. You can use either of the methods or a combination of both. Reading "Nutrition Facts" on packaged food The "Nutrition Facts" list is included on the labels of almost all packaged foods and beverages in the [place] It includes:  The serving size.  Information about nutrients in each serving, including the grams (g) of carbohydrate per serving. To use the "Nutrition Facts":  Decide how many servings you will have.  Multiply the number of servings by the number of carbohydrates per serving.  The resulting number is the total amount of carbohydrates that you will be having. Learning standard serving sizes of other foods When you eat carbohydrate foods that are not packaged or do not include "Nutrition Facts" on the label, you need to measure the servings in order to count the amount of carbohydrates:  Measure the foods that you will  eat with a food scale or measuring cup, if needed.  Decide how many standard-size servings you will eat.  Multiply the number of servings by 15. Most carbohydrate-rich foods have about 15 g of carbohydrates per serving. ? For example, if you eat 8 oz (170 g) of strawberries, you will have eaten 2 servings and 30 g of carbohydrates (2 servings x 15 g = 30 g).  For foods that have more than one food mixed, such as soups and casseroles, you must count the carbohydrates in each food that is included. The following list contains standard serving sizes of common carbohydrate-rich foods. Each of these servings has about 15 g of carbohydrates:   hamburger bun or  English muffin.   oz (15 mL) syrup.   oz (14 g) jelly.  1 slice of bread.  1 six-inch tortilla.  3 oz (85 g) cooked rice or pasta.  4 oz (113 g) cooked dried beans.  4 oz (113 g) starchy vegetable, such as peas, corn, or potatoes.  4 oz (113 g) hot cereal.  4 oz (113 g) mashed potatoes or  of a large baked potato.  4 oz (113 g) canned or frozen fruit.  4 oz (120 mL) fruit juice.  4-6 crackers.  6 chicken nuggets.  6 oz (170 g) unsweetened dry cereal.  6 oz (170 g) plain fat-free yogurt or yogurt sweetened with artificial sweeteners.  8 oz (240 mL) milk.  8 oz (170 g) fresh fruit or one small piece of fruit.  24 oz (680 g) popped popcorn. Example of carbohydrate counting Sample meal  3  oz (85 g) chicken breast.  6 oz (170 g) brown rice.  4 oz (113 g) corn.  8 oz (240 mL) milk.  8 oz (170 g) strawberries with sugar-free whipped topping. Carbohydrate calculation 1. Identify the foods that contain carbohydrates: ? Rice. ? Corn. ? Milk. ? Strawberries. 2. Calculate how many servings you have of each food: ? 2 servings rice. ? 1 serving corn. ? 1 serving milk. ? 1 serving strawberries. 3. Multiply each number of servings by 15 g: ? 2 servings rice x 15 g = 30 g. ? 1 serving corn x 15 g = 15 g.  ? 1 serving milk x 15 g = 15 g. ? 1 serving strawberries x 15 g = 15 g. 4. Add together all of the amounts to find the total grams of carbohydrates eaten: ? 30 g + 15 g + 15 g + 15 g = 75 g of carbohydrates total. Summary  Carbohydrate counting is a method of keeping track of how many carbohydrates you eat.  Eating carbohydrates naturally increases the amount of sugar (glucose) in the blood.  Counting how many carbohydrates you eat helps keep your blood glucose within normal limits, which helps you manage your diabetes.  A diet and nutrition specialist (registered dietitian) can help you make a meal plan and calculate how many carbohydrates you should have at each meal and snack. This information is not intended to replace advice given to you by your health care provider. Make sure you discuss any questions you have with your health care provider. Document Released: 08/24/2005 Document Revised: 03/18/2017 Document Reviewed: 02/05/2016 Elsevier Patient Education  2020 [place] Maintenance for Postmenopausal Women Menopause is a normal process in which your ability to get pregnant comes to an end. This process happens slowly over many months or years, usually between the ages of 1 and 66. Menopause is complete when you have missed your menstrual periods for 12 months. It is important to talk with your health care provider about some of the most common conditions that affect women after menopause (postmenopausal women). These include heart disease, cancer, and bone loss (osteoporosis). Adopting a healthy lifestyle and getting preventive care can help to promote your health and wellness. The actions you take can also lower your chances of developing some of these common conditions. What should I know about menopause? During menopause, you may get a number of symptoms, such as:  Hot flashes. These can be moderate or severe.  Night sweats.  Decrease in sex drive.  Mood  swings.  Headaches.  Tiredness.  Irritability.  Memory problems.  Insomnia. Choosing to treat or not to treat these symptoms is a decision that you make with your health care provider. Do I need hormone replacement therapy?  Hormone replacement therapy is effective in treating symptoms that are caused by menopause, such as hot flashes and night sweats.  Hormone replacement carries certain risks, especially as you become older. If you are thinking about using estrogen or estrogen with progestin, discuss the benefits and risks with your health care provider. What is my risk for heart disease and stroke? The risk of heart disease, heart attack, and stroke increases as you age. One of the causes may be a change in the body's hormones during menopause. This can affect how your body uses dietary fats, triglycerides, and cholesterol. Heart attack and stroke are medical emergencies. There are many things that you can do to help prevent heart disease and stroke. Watch  your blood pressure  High blood pressure causes heart disease and increases the risk of stroke. This is more likely to develop in people who have high blood pressure readings, are of African descent, or are overweight.  Have your blood pressure checked: ? Every 3-5 years if you are 29-75 years of age. ? Every year if you are 61 years old or older. Eat a healthy diet   Eat a diet that includes plenty of vegetables, fruits, low-fat dairy products, and lean protein.  Do not eat a lot of foods that are high in solid fats, added sugars, or sodium. Get regular exercise Get regular exercise. This is one of the most important things you can do for your health. Most adults should:  Try to exercise for at least 150 minutes each week. The exercise should increase your heart rate and make you sweat (moderate-intensity exercise).  Try to do strengthening exercises at least twice each week. Do these in addition to the moderate-intensity  exercise.  Spend less time sitting. Even light physical activity can be beneficial. Other tips  Work with your health care provider to achieve or maintain a healthy weight.  Do not use any products that contain nicotine or tobacco, such as cigarettes, e-cigarettes, and chewing tobacco. If you need help quitting, ask your health care provider.  Know your numbers. Ask your health care provider to check your cholesterol and your blood sugar (glucose). Continue to have your blood tested as directed by your health care provider. Do I need screening for cancer? Depending on your health history and family history, you may need to have cancer screening at different stages of your life. This may include screening for:  Breast cancer.  Cervical cancer.  Lung cancer.  Colorectal cancer. What is my risk for osteoporosis? After menopause, you may be at increased risk for osteoporosis. Osteoporosis is a condition in which bone destruction happens more quickly than new bone creation. To help prevent osteoporosis or the bone fractures that can happen because of osteoporosis, you may take the following actions:  If you are 61-58 years old, get at least 1,000 mg of calcium and at least 600 mg of vitamin D per day.  If you are older than age 56 but younger than age 79, get at least 1,200 mg of calcium and at least 600 mg of vitamin D per day.  If you are older than age 65, get at least 1,200 mg of calcium and at least 800 mg of vitamin D per day. Smoking and drinking excessive alcohol increase the risk of osteoporosis. Eat foods that are rich in calcium and vitamin D, and do weight-bearing exercises several times each week as directed by your health care provider. How does menopause affect my mental health? Depression may occur at any age, but it is more common as you become older. Common symptoms of depression include:  Low or sad mood.  Changes in sleep patterns.  Changes in appetite or eating  patterns.  Feeling an overall lack of motivation or enjoyment of activities that you previously enjoyed.  Frequent crying spells. Talk with your health care provider if you think that you are experiencing depression. General instructions See your health care provider for regular wellness exams and vaccines. This may include:  Scheduling regular health, dental, and eye exams.  Getting and maintaining your vaccines. These include: ? Influenza vaccine. Get this vaccine each year before the flu season begins. ? Pneumonia vaccine. ? Shingles vaccine. ? Tetanus, diphtheria, and  pertussis (Tdap) booster vaccine. Your health care provider may also recommend other immunizations. Tell your health care provider if you have ever been abused or do not feel safe at home. Summary  Menopause is a normal process in which your ability to get pregnant comes to an end.  This condition causes hot flashes, night sweats, decreased interest in sex, mood swings, headaches, or lack of sleep.  Treatment for this condition may include hormone replacement therapy.  Take actions to keep yourself healthy, including exercising regularly, eating a healthy diet, watching your weight, and checking your blood pressure and blood sugar levels.  Get screened for cancer and depression. Make sure that you are up to date with all your vaccines. This information is not intended to replace advice given to you by your health care provider. Make sure you discuss any questions you have with your health care provider. Document Released: 10/16/2005 Document Revised: 08/17/2018 Document Reviewed: 08/17/2018 Elsevier Patient Education  2020 [employer].

## 2019-05-17 LAB — TSH: TSH: 1.38 mIU/L (ref 0.40–4.50)

## 2019-05-24 ENCOUNTER — Other Ambulatory Visit: Payer: Self-pay

## 2019-05-24 ENCOUNTER — Ambulatory Visit
Admission: RE | Admit: 2019-05-24 | Discharge: 2019-05-24 | Disposition: A | Discharge status: Home / Self Care | Payer: BC Managed Care – PPO | Source: Ambulatory Visit | Attending: Internal Medicine | Admitting: Internal Medicine

## 2019-05-24 DIAGNOSIS — Z1231 Encounter for screening mammogram for malignant neoplasm of breast: Secondary | ICD-10-CM | POA: Diagnosis not present

## 2019-06-06 ENCOUNTER — Encounter: Payer: Self-pay | Admitting: Gynecology

## 2019-06-12 DIAGNOSIS — M9905 Segmental and somatic dysfunction of pelvic region: Secondary | ICD-10-CM | POA: Diagnosis not present

## 2019-06-12 DIAGNOSIS — M9901 Segmental and somatic dysfunction of cervical region: Secondary | ICD-10-CM | POA: Diagnosis not present

## 2019-06-12 DIAGNOSIS — M9904 Segmental and somatic dysfunction of sacral region: Secondary | ICD-10-CM | POA: Diagnosis not present

## 2019-06-12 DIAGNOSIS — M9903 Segmental and somatic dysfunction of lumbar region: Secondary | ICD-10-CM | POA: Diagnosis not present

## 2019-06-15 DIAGNOSIS — M9905 Segmental and somatic dysfunction of pelvic region: Secondary | ICD-10-CM | POA: Diagnosis not present

## 2019-06-15 DIAGNOSIS — M9904 Segmental and somatic dysfunction of sacral region: Secondary | ICD-10-CM | POA: Diagnosis not present

## 2019-06-15 DIAGNOSIS — M9903 Segmental and somatic dysfunction of lumbar region: Secondary | ICD-10-CM | POA: Diagnosis not present

## 2019-06-15 DIAGNOSIS — M9901 Segmental and somatic dysfunction of cervical region: Secondary | ICD-10-CM | POA: Diagnosis not present

## 2019-06-26 ENCOUNTER — Encounter: Payer: Self-pay | Admitting: Internal Medicine

## 2019-06-26 ENCOUNTER — Other Ambulatory Visit: Payer: Self-pay

## 2019-06-26 ENCOUNTER — Ambulatory Visit (INDEPENDENT_AMBULATORY_CARE_PROVIDER_SITE_OTHER): Payer: BC Managed Care – PPO | Admitting: Internal Medicine

## 2019-06-26 ENCOUNTER — Telehealth: Payer: Self-pay | Admitting: Internal Medicine

## 2019-06-26 VITALS — BP 130/80 | HR 94 | Temp 97.9°F | Ht 66.0 in | Wt 283.0 lb

## 2019-06-26 DIAGNOSIS — L309 Dermatitis, unspecified: Secondary | ICD-10-CM

## 2019-06-26 MED ORDER — FLUOCINONIDE-E 0.05 % EX CREA: 1.0000 "application " | TOPICAL_CREAM | Freq: Two times a day (BID) | CUTANEOUS | 2 refills | Status: DC

## 2019-06-26 NOTE — Telephone Encounter (Signed)
Scheduled office visit 

## 2019-06-26 NOTE — Progress Notes (Signed)
   Subjective:    Patient ID: Mckenzie Hebert, female    DOB: 03/16/1956, 63 y.o.   MRN: [id number]  HPI 63 year old Female with rash on abdomen present for some time.No change in lotions, detergents, soaps. Complains of itching lower abdomen.    Review of Systems history of anxiety, hypothyroidism, hyperlipidemia     Objective:   Physical Exam BP 130/80 No significant rash on lower abdomen. No evidence of intertrigo or contact dermatitis.       Assessment & Plan:  Non specific dermatitis- has tried lotrisone cream without relief.  Plan: Try Lidex 0.05% cream sparingly twice a day

## 2019-06-26 NOTE — Telephone Encounter (Signed)
Mckenzie Hebert [phone]  [name] called to say she has a rash on her lower stomach for about a week. She has been putting a cream on it, but it is not getting any better. Would like to come in for you to look at it.

## 2019-06-26 NOTE — Patient Instructions (Signed)
Lidex cream 0.05% twice a day to rash as needed

## 2019-06-26 NOTE — Telephone Encounter (Signed)
OK 

## 2019-06-30 ENCOUNTER — Telehealth: Payer: Self-pay

## 2019-06-30 NOTE — Telephone Encounter (Signed)
Patient called said the prescription that was sent to [facility] is on back order fluocinonide-emollient (LIDEX-E) 0.05 % cream she wants to know if you can prescribe something else?

## 2019-06-30 NOTE — Telephone Encounter (Signed)
Please call pharmacy and see what else is similar

## 2019-07-03 NOTE — Telephone Encounter (Signed)
Called pharmacy, pharmacist stated they had a 30mg  tube of the original script but not 60mg  tube they will be giving patient the 30mg  tube and the rest once it comes in.

## 2019-07-10 ENCOUNTER — Other Ambulatory Visit: Payer: Self-pay

## 2019-07-10 MED ORDER — LEVOTHYROXINE SODIUM 175 MCG PO TABS: 175.0000 ug | ORAL_TABLET | Freq: Every day | ORAL | 3 refills | Status: DC

## 2019-07-28 ENCOUNTER — Telehealth: Payer: Self-pay | Admitting: Internal Medicine

## 2019-07-28 ENCOUNTER — Other Ambulatory Visit: Payer: Self-pay | Admitting: Internal Medicine

## 2019-07-28 MED ORDER — ROSUVASTATIN CALCIUM 5 MG PO TABS: 5.0000 mg | ORAL_TABLET | Freq: Every day | ORAL | 1 refills | Status: DC

## 2019-07-28 NOTE — Telephone Encounter (Signed)
Received Fax RX request from  [facility] [place], [place] - [address] AT [address] [phone] (Phone) [phone] (Fax)     Medication - rosuvastatin (CRESTOR) 5 MG tablet    Last Refill - 05/06/19  Last OV - 06/26/19  Last CPE - 02/03/19  Next Appointment - 08/08/19

## 2019-07-28 NOTE — Addendum Note (Signed)
Addended by: Mady Haagensen on: 07/28/2019 02:30 PM   Modules accepted: Orders

## 2019-08-07 ENCOUNTER — Other Ambulatory Visit: Payer: BC Managed Care – PPO | Admitting: Internal Medicine

## 2019-08-07 ENCOUNTER — Other Ambulatory Visit: Payer: Self-pay

## 2019-08-07 DIAGNOSIS — E119 Type 2 diabetes mellitus without complications: Secondary | ICD-10-CM

## 2019-08-07 DIAGNOSIS — E039 Hypothyroidism, unspecified: Secondary | ICD-10-CM | POA: Diagnosis not present

## 2019-08-08 ENCOUNTER — Ambulatory Visit: Payer: BC Managed Care – PPO | Admitting: Internal Medicine

## 2019-08-08 ENCOUNTER — Encounter: Payer: Self-pay | Admitting: Internal Medicine

## 2019-08-08 VITALS — BP 120/70 | HR 78 | Ht 66.0 in | Wt 274.0 lb

## 2019-08-08 DIAGNOSIS — R7302 Impaired glucose tolerance (oral): Secondary | ICD-10-CM

## 2019-08-08 DIAGNOSIS — E782 Mixed hyperlipidemia: Secondary | ICD-10-CM

## 2019-08-08 DIAGNOSIS — E039 Hypothyroidism, unspecified: Secondary | ICD-10-CM

## 2019-08-08 DIAGNOSIS — Z6841 Body Mass Index (BMI) 40.0 and over, adult: Secondary | ICD-10-CM

## 2019-08-08 DIAGNOSIS — L309 Dermatitis, unspecified: Secondary | ICD-10-CM

## 2019-08-08 DIAGNOSIS — G4709 Other insomnia: Secondary | ICD-10-CM

## 2019-08-08 DIAGNOSIS — F419 Anxiety disorder, unspecified: Secondary | ICD-10-CM

## 2019-08-08 LAB — HEMOGLOBIN A1C
Hgb A1c MFr Bld: 6.7 % of total Hgb — ABNORMAL HIGH (ref <5.7)
Mean Plasma Glucose: 146 (calc)
eAG (mmol/L): 8.1 (calc)

## 2019-08-08 LAB — TSH: TSH: 1.45 mIU/L (ref 0.40–4.50)

## 2019-08-08 NOTE — Progress Notes (Signed)
   Subjective:    Patient ID: Mckenzie Hebert, female    DOB: 08-19-1956, 63 y.o.   MRN: [id number]  HPI 63 year old Female for follow up on hypothyroidism and glucose intolerance. TSH is excellent on current dose of thyroid replacement. Patient says she feels well. Some work stress. Resides alone. Sometimes a bit lonely. Not depressed.Hgb AIC stable at 6.7 % with diet alone.She is on on Crestor 5 mg 3 times a week.Lipid panel was normal in may on this dose of Crestor. Some exercise with house and yard work.  Discussed stress at work. 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 Discussed staying safe during pandemic. She is careful and wears protective equipment at work.  Review of Systems did have rash on abdomen which improved with Lidex E 0.05% cream.     Objective:   Physical Exam BP 120/80 pulse 78, Wt 274 pounds,BMI 44.22.  No thyromegaly , no cervical adenopathy. Chest is clear. No skin rashes. Cor:RRR, no LE edema.      Assessment & Plan:  Hypothyroidism-stable on current dose of thyroid replacement  Hyperlipidemia-stable on Crestor 5 mg 3 times a week  Abdominal rash?  Eczema stable with as needed use of Lidex cream 0.05% up to twice daily as needed.  Obesity-discussed diet and exercise.  Anxiety treated with Xanax and stable  Plan: She will return in March for TSH and hemoglobin A1c.  Her physical exam will be due in May 2021.  She  had flu vaccine in September.

## 2019-08-23 ENCOUNTER — Encounter: Payer: Self-pay | Admitting: Internal Medicine

## 2019-08-23 NOTE — Patient Instructions (Signed)
It was a pleasure to see you today.  Return in March for TSH and hemoglobin A1c.  CPE due in May 2021.  Continue current medications.  Try to get some walking in for exercise.

## 2019-09-12 DIAGNOSIS — M1712 Unilateral primary osteoarthritis, left knee: Secondary | ICD-10-CM | POA: Diagnosis not present

## 2019-10-17 ENCOUNTER — Telehealth: Payer: Self-pay | Admitting: Internal Medicine

## 2019-10-17 NOTE — Telephone Encounter (Signed)
Received Fax RX request from  [facility] [address] have left Vice message for patient to call back and confirm that she wants this prescription, we have it as discontinued and she is now using  [facility] [place], [place] - [address] Phone:  [phone]  Fax:  [phone]       Medication - albuterol [facility] HFA) 108 (90 Base) MCG/ACT inhaler    Last Refill - 08/15/18  Last OV - 08/08/19  Last CPE - 02/03/19  Next Appointment - 02/06/20

## 2019-10-18 NOTE — Telephone Encounter (Signed)
Mckenzie Hebert called back to say she does not know why [facility] would be sending refill request, she does not need this and she now uses [facility] for all prescriptions because of her insurance.

## 2019-10-19 ENCOUNTER — Other Ambulatory Visit: Payer: Self-pay

## 2019-10-19 MED ORDER — ALBUTEROL SULFATE HFA 108 (90 BASE) MCG/ACT IN AERS: 2.0000 | INHALATION_SPRAY | Freq: Four times a day (QID) | RESPIRATORY_TRACT | 3 refills | Status: DC | PRN

## 2019-10-24 ENCOUNTER — Other Ambulatory Visit: Payer: Self-pay

## 2019-10-24 ENCOUNTER — Encounter: Payer: Self-pay | Admitting: Internal Medicine

## 2019-10-24 ENCOUNTER — Ambulatory Visit: Payer: BC Managed Care – PPO | Admitting: Internal Medicine

## 2019-10-24 VITALS — BP 120/80 | HR 81 | Temp 98.0°F | Ht 66.0 in | Wt 279.0 lb

## 2019-10-24 DIAGNOSIS — H6501 Acute serous otitis media, right ear: Secondary | ICD-10-CM | POA: Diagnosis not present

## 2019-10-24 MED ORDER — AZITHROMYCIN 250 MG PO TABS: ORAL_TABLET | ORAL | 0 refills | Status: DC

## 2019-10-24 NOTE — Progress Notes (Signed)
   Subjective:    Patient ID: Mckenzie Hebert, female    DOB: 07-15-1956, 64 y.o.   MRN: [id number]  HPI 64 year old Female complaining of right ear pain.  No history of respiratory infection, fever, chills, or exposure to COVID-19 that she is aware.  Denies sore throat.  No dysgeusia.    Review of Systems see above     Objective:   Physical Exam  Right ear canal is clear.  Right TM is full but not red.  Left TM clear.  Pharynx clear.  Neck supple.  Chest clear.  No cervical adenopathy.  Vital signs reviewed.  She is afebrile.    Assessment & Plan:  Right serous otitis media  Plan: Zithromax Z-PAK 2 p.o. day 1 followed by 1 p.o. days 2 through 5.

## 2019-10-25 ENCOUNTER — Telehealth: Payer: Self-pay | Admitting: Internal Medicine

## 2019-10-25 MED ORDER — LEVOTHYROXINE SODIUM 175 MCG PO TABS: 175.0000 ug | ORAL_TABLET | Freq: Every day | ORAL | 3 refills | Status: DC

## 2019-10-25 NOTE — Addendum Note (Signed)
Addended by: Mady Haagensen on: 10/25/2019 12:10 PM   Modules accepted: Orders

## 2019-10-25 NOTE — Telephone Encounter (Signed)
Received Fax RX request from  [facility] [place], [place] - [address] AT [address] Phone:  [phone]  Fax:  [phone]       Medication - levothyroxine (SYNTHROID) 175 MCG tablet   Last Refill - 09/29/19  Last OV - 10/25/19  Last CPE - 02/03/19  Next Appointment - 02/06/20

## 2019-10-30 DIAGNOSIS — H52203 Unspecified astigmatism, bilateral: Secondary | ICD-10-CM | POA: Diagnosis not present

## 2019-10-30 DIAGNOSIS — H25813 Combined forms of age-related cataract, bilateral: Secondary | ICD-10-CM | POA: Diagnosis not present

## 2019-10-30 DIAGNOSIS — H43812 Vitreous degeneration, left eye: Secondary | ICD-10-CM | POA: Diagnosis not present

## 2019-10-30 DIAGNOSIS — E119 Type 2 diabetes mellitus without complications: Secondary | ICD-10-CM | POA: Diagnosis not present

## 2019-10-30 LAB — HM DIABETES EYE EXAM

## 2019-11-05 NOTE — Patient Instructions (Signed)
Take Zithromax Z-PAK 2 p.o. day 1 followed by 1 p.o. days 2 through 5 for acute right serous otitis media and call if not better in 7 to 10 days or sooner if worse

## 2019-11-06 ENCOUNTER — Other Ambulatory Visit: Payer: BC Managed Care – PPO | Admitting: Internal Medicine

## 2019-11-06 ENCOUNTER — Other Ambulatory Visit: Payer: Self-pay

## 2019-11-06 DIAGNOSIS — E039 Hypothyroidism, unspecified: Secondary | ICD-10-CM | POA: Diagnosis not present

## 2019-11-06 DIAGNOSIS — E119 Type 2 diabetes mellitus without complications: Secondary | ICD-10-CM

## 2019-11-07 LAB — HEMOGLOBIN A1C
Hgb A1c MFr Bld: 6.9 % of total Hgb — ABNORMAL HIGH (ref <5.7)
Mean Plasma Glucose: 151 (calc)
eAG (mmol/L): 8.4 (calc)

## 2019-11-07 LAB — TSH: TSH: 2.68 mIU/L (ref 0.40–4.50)

## 2020-01-11 ENCOUNTER — Telehealth: Payer: Self-pay | Admitting: Internal Medicine

## 2020-01-11 MED ORDER — ROSUVASTATIN CALCIUM 5 MG PO TABS: 5.0000 mg | ORAL_TABLET | Freq: Every day | ORAL | 3 refills | Status: DC

## 2020-01-11 NOTE — Telephone Encounter (Signed)
Refill x one year °

## 2020-01-11 NOTE — Telephone Encounter (Signed)
Received Fax RX request from  [facility] [place], [place] - [address] AT [address] Phone:  [phone]  Fax:  [phone]       Medication - rosuvastatin (CRESTOR) 5 MG tablet   Last Refill - 10/19/2019  Last OV - 10/24/19  Last CPE - 02/03/19  Next Appointment - 02/13/20

## 2020-01-23 ENCOUNTER — Other Ambulatory Visit: Payer: Self-pay

## 2020-01-23 MED ORDER — LEVOTHYROXINE SODIUM 175 MCG PO TABS: 175.0000 ug | ORAL_TABLET | Freq: Every day | ORAL | 0 refills | Status: DC

## 2020-01-23 NOTE — Addendum Note (Signed)
Addended by: Mady Haagensen on: 01/23/2020 11:30 AM   Modules accepted: Orders

## 2020-01-29 ENCOUNTER — Other Ambulatory Visit: Payer: BC Managed Care – PPO | Admitting: Internal Medicine

## 2020-01-30 ENCOUNTER — Encounter: Payer: BC Managed Care – PPO | Admitting: Internal Medicine

## 2020-02-06 ENCOUNTER — Encounter: Payer: BC Managed Care – PPO | Admitting: Internal Medicine

## 2020-02-07 DIAGNOSIS — M79641 Pain in right hand: Secondary | ICD-10-CM | POA: Diagnosis not present

## 2020-02-07 DIAGNOSIS — M19041 Primary osteoarthritis, right hand: Secondary | ICD-10-CM | POA: Diagnosis not present

## 2020-02-12 ENCOUNTER — Other Ambulatory Visit: Payer: Self-pay

## 2020-02-12 ENCOUNTER — Other Ambulatory Visit: Payer: BC Managed Care – PPO | Admitting: Internal Medicine

## 2020-02-12 DIAGNOSIS — Z0000 Encounter for general adult medical examination without abnormal findings: Secondary | ICD-10-CM

## 2020-02-12 DIAGNOSIS — J45909 Unspecified asthma, uncomplicated: Secondary | ICD-10-CM

## 2020-02-12 DIAGNOSIS — E782 Mixed hyperlipidemia: Secondary | ICD-10-CM | POA: Diagnosis not present

## 2020-02-12 DIAGNOSIS — E119 Type 2 diabetes mellitus without complications: Secondary | ICD-10-CM

## 2020-02-12 DIAGNOSIS — E039 Hypothyroidism, unspecified: Secondary | ICD-10-CM | POA: Diagnosis not present

## 2020-02-12 DIAGNOSIS — G4709 Other insomnia: Secondary | ICD-10-CM

## 2020-02-12 DIAGNOSIS — E559 Vitamin D deficiency, unspecified: Secondary | ICD-10-CM

## 2020-02-13 ENCOUNTER — Encounter: Payer: Self-pay | Admitting: Internal Medicine

## 2020-02-13 ENCOUNTER — Other Ambulatory Visit (HOSPITAL_COMMUNITY)
Admission: RE | Admit: 2020-02-13 | Discharge: 2020-02-13 | Disposition: A | Discharge status: Home / Self Care | Payer: BC Managed Care – PPO | Source: Ambulatory Visit | Attending: Internal Medicine | Admitting: Internal Medicine

## 2020-02-13 ENCOUNTER — Ambulatory Visit: Payer: BC Managed Care – PPO | Admitting: Internal Medicine

## 2020-02-13 VITALS — BP 120/82 | Ht 66.0 in | Wt 281.0 lb

## 2020-02-13 DIAGNOSIS — Z124 Encounter for screening for malignant neoplasm of cervix: Secondary | ICD-10-CM | POA: Insufficient documentation

## 2020-02-13 DIAGNOSIS — E039 Hypothyroidism, unspecified: Secondary | ICD-10-CM

## 2020-02-13 DIAGNOSIS — F419 Anxiety disorder, unspecified: Secondary | ICD-10-CM

## 2020-02-13 DIAGNOSIS — E782 Mixed hyperlipidemia: Secondary | ICD-10-CM

## 2020-02-13 DIAGNOSIS — F5105 Insomnia due to other mental disorder: Secondary | ICD-10-CM

## 2020-02-13 DIAGNOSIS — E8881 Metabolic syndrome: Secondary | ICD-10-CM | POA: Diagnosis not present

## 2020-02-13 DIAGNOSIS — Z6841 Body Mass Index (BMI) 40.0 and over, adult: Secondary | ICD-10-CM

## 2020-02-13 DIAGNOSIS — E119 Type 2 diabetes mellitus without complications: Secondary | ICD-10-CM

## 2020-02-13 DIAGNOSIS — Z0000 Encounter for general adult medical examination without abnormal findings: Secondary | ICD-10-CM | POA: Diagnosis not present

## 2020-02-13 DIAGNOSIS — F409 Phobic anxiety disorder, unspecified: Secondary | ICD-10-CM

## 2020-02-13 DIAGNOSIS — Z8709 Personal history of other diseases of the respiratory system: Secondary | ICD-10-CM | POA: Diagnosis not present

## 2020-02-13 LAB — COMPLETE METABOLIC PANEL WITH GFR
AG Ratio: 1.5 (calc) (ref 1.0–2.5)
ALT: 23 U/L (ref 6–29)
AST: 18 U/L (ref 10–35)
Albumin: 4 g/dL (ref 3.6–5.1)
Alkaline phosphatase (APISO): 59 U/L (ref 37–153)
BUN: 10 mg/dL (ref 7–25)
CO2: 29 mmol/L (ref 20–32)
Calcium: 9.2 mg/dL (ref 8.6–10.4)
Chloride: 104 mmol/L (ref 98–110)
Creat: 0.71 mg/dL (ref 0.50–0.99)
GFR, Est African American: 104 mL/min/{1.73_m2} (ref >=60)
GFR, Est Non African American: 90 mL/min/{1.73_m2} (ref >=60)
Globulin: 2.7 g/dL (calc) (ref 1.9–3.7)
Glucose, Bld: 118 mg/dL — ABNORMAL HIGH (ref 65–99)
Potassium: 4.5 mmol/L (ref 3.5–5.3)
Sodium: 139 mmol/L (ref 135–146)
Total Bilirubin: 0.4 mg/dL (ref 0.2–1.2)
Total Protein: 6.7 g/dL (ref 6.1–8.1)

## 2020-02-13 LAB — POCT URINALYSIS DIPSTICK
Appearance: NEGATIVE
Bilirubin, UA: NEGATIVE
Blood, UA: NEGATIVE
Glucose, UA: NEGATIVE
Ketones, UA: NEGATIVE
Leukocytes, UA: NEGATIVE
Nitrite, UA: NEGATIVE
Odor: NEGATIVE
Protein, UA: NEGATIVE
Spec Grav, UA: 1.015 (ref 1.010–1.025)
Urobilinogen, UA: 0.2 E.U./dL
pH, UA: 6.5 (ref 5.0–8.0)

## 2020-02-13 LAB — CBC WITH DIFFERENTIAL/PLATELET
Absolute Monocytes: 390 cells/uL (ref 200–950)
Basophils Absolute: 39 cells/uL (ref 0–200)
Basophils Relative: 0.6 %
Eosinophils Absolute: 150 cells/uL (ref 15–500)
Eosinophils Relative: 2.3 %
HCT: 38.6 % (ref 35.0–45.0)
Hemoglobin: 12.8 g/dL (ref 11.7–15.5)
Lymphs Abs: 1281 cells/uL (ref 850–3900)
MCH: 27.1 pg (ref 27.0–33.0)
MCHC: 33.2 g/dL (ref 32.0–36.0)
MCV: 81.8 fL (ref 80.0–100.0)
MPV: 9.9 fL (ref 7.5–12.5)
Monocytes Relative: 6 %
Neutro Abs: 4641 cells/uL (ref 1500–7800)
Neutrophils Relative %: 71.4 %
Platelets: 275 10*3/uL (ref 140–400)
RBC: 4.72 10*6/uL (ref 3.80–5.10)
RDW: 13.6 % (ref 11.0–15.0)
Total Lymphocyte: 19.7 %
WBC: 6.5 10*3/uL (ref 3.8–10.8)

## 2020-02-13 LAB — HEMOGLOBIN A1C
Hgb A1c MFr Bld: 6.3 % of total Hgb — ABNORMAL HIGH (ref <5.7)
Mean Plasma Glucose: 134 (calc)
eAG (mmol/L): 7.4 (calc)

## 2020-02-13 LAB — LIPID PANEL
Cholesterol: 169 mg/dL (ref <200)
HDL: 56 mg/dL (ref >=50)
LDL Cholesterol (Calc): 93 mg/dL (calc)
Non-HDL Cholesterol (Calc): 113 mg/dL (calc) (ref <130)
Total CHOL/HDL Ratio: 3 (calc) (ref <5.0)
Triglycerides: 104 mg/dL (ref <150)

## 2020-02-13 LAB — TSH: TSH: 1.46 mIU/L (ref 0.40–4.50)

## 2020-02-14 ENCOUNTER — Other Ambulatory Visit: Payer: Self-pay

## 2020-02-14 ENCOUNTER — Encounter (INDEPENDENT_AMBULATORY_CARE_PROVIDER_SITE_OTHER): Payer: Self-pay | Admitting: Family Medicine

## 2020-02-14 ENCOUNTER — Ambulatory Visit (INDEPENDENT_AMBULATORY_CARE_PROVIDER_SITE_OTHER): Payer: BC Managed Care – PPO | Admitting: Family Medicine

## 2020-02-14 VITALS — BP 128/79 | HR 52 | Temp 97.8°F | Ht 66.0 in | Wt 278.0 lb

## 2020-02-14 DIAGNOSIS — E1165 Type 2 diabetes mellitus with hyperglycemia: Secondary | ICD-10-CM | POA: Diagnosis not present

## 2020-02-14 DIAGNOSIS — M1712 Unilateral primary osteoarthritis, left knee: Secondary | ICD-10-CM

## 2020-02-14 DIAGNOSIS — F3289 Other specified depressive episodes: Secondary | ICD-10-CM

## 2020-02-14 DIAGNOSIS — R5383 Other fatigue: Secondary | ICD-10-CM

## 2020-02-14 DIAGNOSIS — Z0289 Encounter for other administrative examinations: Secondary | ICD-10-CM

## 2020-02-14 DIAGNOSIS — E038 Other specified hypothyroidism: Secondary | ICD-10-CM

## 2020-02-14 DIAGNOSIS — E66813 Obesity, class 3: Secondary | ICD-10-CM

## 2020-02-14 DIAGNOSIS — Z6841 Body Mass Index (BMI) 40.0 and over, adult: Secondary | ICD-10-CM

## 2020-02-14 DIAGNOSIS — R0602 Shortness of breath: Secondary | ICD-10-CM

## 2020-02-14 DIAGNOSIS — Z9189 Other specified personal risk factors, not elsewhere classified: Secondary | ICD-10-CM

## 2020-02-14 LAB — MICROALBUMIN / CREATININE URINE RATIO
Creatinine, Urine: 79 mg/dL (ref 20–275)
Microalb, Ur: <0.2 mg/dL

## 2020-02-14 MED ORDER — BUPROPION HCL ER (SR) 150 MG PO TB12: 150.0000 mg | ORAL_TABLET | Freq: Every day | ORAL | 0 refills | Status: DC

## 2020-02-14 NOTE — Progress Notes (Signed)
Chief Complaint:   OBESITY Mckenzie Hebert (MR# [id number]) is a 64 y.o. female who presents for evaluation and treatment of obesity and related comorbidities. Current BMI is Body mass index is 44.87 kg/m.Marland Kitchen Mckenzie Hebert has been struggling with her weight for many years and has been unsuccessful in either losing weight, maintaining weight loss, or reaching her healthy weight goal.  Mckenzie Hebert is currently in the action stage of change and ready to dedicate time achieving and maintaining a healthier weight. Mckenzie Hebert is interested in becoming our patient and working on intensive lifestyle modifications including (but not limited to) diet and exercise for weight loss.  Mckenzie Hebert heard about our clinic from her sister and from her physician. She noticed significant weight gain when her husband died in Dec 11, 2006 and notes fairly significant fatigue. Breakfast is an apple and Premier Protein and 1 slice of Belvita; Break is 1 packet of organic oatmeal with 1/3 cup walnuts or Oikos yogurt with pretzel pieces; Lunch around 12 C.X./44:81 p.m. is 3-4 slices of [place] and cheese sandwich with mustard, mayonnaise, and an individual bag of chips with applesauce pouch or sugar free vanilla pudding; Snack is 1 Belvita cookie; Dinner is Teacher, music with steamable vegetables or a [employer].  Mckenzie Hebert's habits were reviewed today and are as follows: she struggles with family and or coworkers weight loss sabotage, her desired weight loss is 128 lbs, she started gaining weight when husband suddenly died in 2006/12/11, her heaviest weight ever was 280 pounds, she craves sweets and fried foods, she snacks frequently in the evenings, she makes poor food choices some days, she sometimes eats larger portions than normal, she has binge eating behaviors and she struggles with emotional eating.  Depression Screen Mckenzie Hebert's Food and Mood (modified PHQ-9) score was 21.  Depression screen PHQ 2/9 02/14/2020  Decreased Interest 3  Down, Depressed, Hopeless 3    PHQ - 2 Score 6  Altered sleeping 3  Tired, decreased energy 3  Change in appetite 3  Feeling bad or failure about yourself  3  Trouble concentrating 1  Moving slowly or fidgety/restless 2  Suicidal thoughts 0  PHQ-9 Score 21  Difficult doing work/chores Not difficult at all  Some recent data might be hidden   Subjective:   Other fatigue. [name] denies daytime somnolence and denies waking up still tired. Mckenzie Hebert generally gets 8 hours of sleep per night, and states that she awakens every 1-2 hours. Apneic episodes are not present. Epworth Sleepiness Score is 7.  SOB (shortness of breath) on exertion. [name] notes increasing shortness of breath with exercising and seems to be worsening over time with weight gain. She notes getting out of breath sooner with activity than she used to. This has gotten worse recently. [name] denies shortness of breath at rest or orthopnea.  Other specified hypothyroidism. Mckenzie Hebert is on levothyroxine 175 mcg currently. She has a history of Hashimoto's thyroiditis.  Lab Results  Component Value Date   TSH 1.46 02/12/2020   Type 2 diabetes mellitus with hyperglycemia, without long-term current use of insulin (Mckenzie Hebert). Recent A1c 6.3. [name] tried metformin years ago and had suicidal ideation on statin. Last eye exam was in 12/11/19. She sees [facility].   Lab Results  Component Value Date   HGBA1C 6.3 (H) 02/12/2020   HGBA1C 6.9 (H) 11/06/2019   HGBA1C 6.7 (H) 08/07/2019   Lab Results  Component Value Date   MICROALBUR <0.2 02/13/2020   LDLCALC 93 02/12/2020   CREATININE 0.71 02/12/2020  No results found for: INSULIN  Osteoarthritis of left knee, unspecified osteoarthritis type. [name] needs a total knee replacement, but needs her BMI to be <40.  Other depression, with emotional eating. Mckenzie Hebert is struggling with emotional eating and using food for comfort to the extent that it is negatively impacting her health. She has been working on behavior  modification techniques to help reduce her emotional eating and has been somewhat successful. She shows no sign of suicidal or homicidal ideations. Mckenzie Hebert does report changes in sleep, changes in appetite, and changes in energy (significant decrease in energy).  At risk for hyperglycemia. [name] is at risk for hyperglycemia due to not being on diabetes mellitus medication.  Assessment/Plan:   Other fatigue. [name] does feel that her weight is causing her energy to be lower than it should be. Fatigue may be related to obesity, depression or many other causes. Labs will be ordered, and in the meanwhile, [name] will focus on self care including making healthy food choices, increasing physical activity and focusing on stress reduction. EKG 12-Lead showed sinus rhythm with PVC's (similar to EKG done in August 2020 at [facility] with Dr. Percival Spanish). Vitamin B12, Folate labs ordered today.  SOB (shortness of breath) on exertion. Prabhleen does feel that she gets out of breath more easily that she used to when she exercises. Aysa's shortness of breath appears to be obesity related and exercise induced. She has agreed to work on weight loss and gradually increase exercise to treat her exercise induced shortness of breath. Will continue to monitor closely. Vitamin B12, Folate  Other specified hypothyroidism. Patient with long-standing hypothyroidism, on levothyroxine therapy. She appears euthyroid. Orders and follow up as documented in patient record. T3, T4 levels ordered today.  Counseling . Good thyroid control is important for overall health. Supratherapeutic thyroid levels are dangerous and will not improve weight loss results. . The correct way to take levothyroxine is fasting, with water, separated by at least 30 minutes from breakfast, and separated by more than 4 hours from calcium, iron, multivitamins, acid reflux medications (PPIs).    Type 2 diabetes mellitus with hyperglycemia, without long-term  current use of insulin (Mckenzie Hebert). Good blood sugar control is important to decrease the likelihood of diabetic complications such as nephropathy, neuropathy, limb loss, blindness, coronary artery disease, and death. Intensive lifestyle modification including diet, exercise and weight loss are the first line of treatment for diabetes. Insulin, random level ordered today.  Osteoarthritis of left knee, unspecified osteoarthritis type. Mckenzie Hebert is to follow-up at subsequent visits. She will follow the Category 3 meal plan.  Other depression, with emotional eating. Behavior modification techniques were discussed today to help Rilyn deal with her emotional/non-hunger eating behaviors.  Orders and follow up as documented in patient record. Ismael will start buPROPion (WELLBUTRIN SR) 150 MG 12 hr tablet PO daily #30 with 0 refills.  At risk for hyperglycemia. Mckenzie Hebert was given approximately 15 minutes of counseling today regarding prevention of hyperglycemia. She was advised of hyperglycemia causes and the fact hyperglycemia is often asymptomatic. Mckenzie Hebert was instructed to avoid skipping meals, eat regular protein rich meals and schedule low calorie but protein rich snacks as needed.   Repetitive spaced learning was employed today to elicit superior memory formation and behavioral change.  Class 3 severe obesity with serious comorbidity and body mass index (BMI) of 40.0 to 44.9 in adult, unspecified obesity type ([place]).  Mckenzie Hebert is currently in the action stage of change and her goal is to continue with  weight loss efforts. I recommend Lily begin the structured treatment plan as follows:  She has agreed to the Category 3 Plan.  Exercise goals: No exercise has been prescribed at this time.   Behavioral modification strategies: increasing lean protein intake, meal planning and cooking strategies, keeping healthy foods in the home and planning for success.  She was informed of the importance of frequent follow-up visits  to maximize her success with intensive lifestyle modifications for her multiple health conditions. She was informed we would discuss her lab results at her next visit unless there is a critical issue that needs to be addressed sooner. Mckenzie Hebert agreed to keep her next visit at the agreed upon time to discuss these results.  Objective:   Blood pressure 128/79, pulse (!) 52, temperature 97.8 F (36.6 C), temperature source Oral, height 5\' 6"  (1.676 m), weight 278 lb (126.1 kg), last menstrual period 03/18/2008, SpO2 96 %. Body mass index is 44.87 kg/m.  EKG: Sinus  Rhythm with a rate of 80 BPM - frequent ectopic ventricular beats; # VECs = 2. Low voltage in precordial leads. Incomplete right bundle branch block. Abnormal.  Indirect Calorimeter completed today shows a VO2 of 355 and a [name] of 2471.  Her calculated basal metabolic rate is 8850 thus her basal metabolic rate is better than expected.  General: Cooperative, alert, well developed, in no acute distress. HEENT: Conjunctivae and lids unremarkable. Cardiovascular: Regular rhythm.  Lungs: Normal work of breathing. Neurologic: No focal deficits.   Lab Results  Component Value Date   CREATININE 0.71 02/12/2020   BUN 10 02/12/2020   NA 139 02/12/2020   K 4.5 02/12/2020   CL 104 02/12/2020   CO2 29 02/12/2020   Lab Results  Component Value Date   ALT 23 02/12/2020   AST 18 02/12/2020   ALKPHOS 64 11/06/2016   BILITOT 0.4 02/12/2020   Lab Results  Component Value Date   HGBA1C 6.3 (H) 02/12/2020   HGBA1C 6.9 (H) 11/06/2019   HGBA1C 6.7 (H) 08/07/2019   HGBA1C 6.9 (H) 01/26/2019   HGBA1C 6.4 (H) 09/12/2018   No results found for: INSULIN Lab Results  Component Value Date   TSH 1.46 02/12/2020   Lab Results  Component Value Date   CHOL 169 02/12/2020   HDL 56 02/12/2020   LDLCALC 93 02/12/2020   TRIG 104 02/12/2020   CHOLHDL 3.0 02/12/2020   Lab Results  Component Value Date   WBC 6.5 02/12/2020   HGB 12.8 02/12/2020    HCT 38.6 02/12/2020   MCV 81.8 02/12/2020   PLT 275 02/12/2020   Lab Results  Component Value Date   IRON 60 01/14/2018   TIBC 398 01/14/2018   FERRITIN 22 01/14/2018   Attestation Statements:   Reviewed by clinician on day of visit: allergies, medications, problem list, medical history, surgical history, family history, social history, and previous encounter notes.   This is the patient's first visit at [facility]. The patient's NEW PATIENT PACKET was reviewed at length. Included in the packet: current and past health history, medications, allergies, ROS, gynecologic history (women only), surgical history, family history, social history, weight history, weight loss surgery history (for those that have had weight loss surgery), nutritional evaluation, mood and food questionnaire, PHQ9, Epworth questionnaire, sleep habits questionnaire, patient life and health improvement goals questionnaire. These will all be scanned into the patient's chart under media.   During the visit, I independently reviewed the patient's EKG, bioimpedance scale results, and indirect  calorimeter results. I used this information to tailor a meal plan for the patient that will help her to lose weight and will improve her obesity-related conditions going forward. I performed a medically necessary appropriate examination and/or evaluation. I discussed the assessment and treatment plan with the patient. The patient was provided an opportunity to ask questions and all were answered. The patient agreed with the plan and demonstrated an understanding of the instructions. Labs were ordered at this visit and will be reviewed at the next visit unless more critical results need to be addressed immediately. Clinical information was updated and documented in the EMR.   Time spent on visit including pre-visit chart review and post-visit care was 45 minutes.   A separate 15 minutes was spent on risk counseling (see  above).   I, Michaelene Song, am acting as transcriptionist for Coralie Common, MD   I have reviewed the above documentation for accuracy and completeness, and I agree with the above. - Jinny Blossom, MD

## 2020-02-15 LAB — FOLATE: Folate: >20 ng/mL (ref >3.0)

## 2020-02-15 LAB — CYTOLOGY - PAP
Comment: NEGATIVE
Diagnosis: NEGATIVE
High risk HPV: NEGATIVE

## 2020-02-15 LAB — T3: T3, Total: 108 ng/dL (ref 71–180)

## 2020-02-15 LAB — INSULIN, RANDOM: INSULIN: 20.7 u[IU]/mL (ref 2.6–24.9)

## 2020-02-15 LAB — VITAMIN B12: Vitamin B-12: 862 pg/mL (ref 232–1245)

## 2020-02-15 LAB — T4: T4, Total: 9.7 ug/dL (ref 4.5–12.0)

## 2020-02-25 NOTE — Patient Instructions (Addendum)
It was a pleasure to see you today for health maintenance exam. Continue same meds and RTC in 6 months.  Pap smear is within normal limits.  Consider repeat colonoscopy.

## 2020-02-25 NOTE — Progress Notes (Signed)
Subjective:    Patient ID: Mckenzie Hebert, female    DOB: 1955/11/29, 64 y.o.   MRN: [id number]  HPI 64 year old Female for health maintenance exam and evaluation of medical issues.  History of hysteroscopic resection of endometrial polyp and leiomyoma October 2019 by Dr. Phineas Real.  History of asthma, eczema, hyperplastic colon polyps, insomnia, anxiety, urticaria, history of migraine headaches, hypothyroidism, history of vitamin D deficiency.  History of Chiari I malformation.  History of GE reflux, obesity and impaired glucose tolerance.  History of palpitations.  History of functional constipation.  She had H. pylori in 2009.  Angioedema of the lip in 2010.  Herpes zoster June 2011.  History of asthma treated with Advair and Singulair.  A colonoscopy by Dr. Collene Mares July 2007.  Has seen Dr. Buddy Duty, endocrinologist regarding hypothyroidism and impaired glucose tolerance last visit on file was March 2020.  She is seeing Dr. Adair Patter at [facility].  Left knee arthroscopic surgery 2007.  Appendectomy 1993.  Bone spur removed from left foot 1999.  Bone spur removed from right foot 2008.  Left knee surgery December 2008.  Social history: Patient is a widow.  Husband died of heart problems.  No children.  She smoked until the age of 1 and then quit.  No alcohol consumption.  She works as a Archivist at [employer].  She resides alone.  Lives in the [place] area.  Family history: Parents are deceased.  Mother died at age 77 with history of hypertension, diabetes, coronary artery disease, MI status post CABG.  Father died in his 30s with history of lung cancer.  2 sisters.  1 sister with history of diabetes and hypertension.  2 brothers.      Review of Systems  Constitutional: Negative.   HENT: Negative.   Respiratory: Negative.   Gastrointestinal:       History of functional constipation  Genitourinary: Negative.   Neurological:  Negative.   Psychiatric/Behavioral:       History of anxiety and insomnia due to fear       Objective:   Physical Exam Blood pressure 120/82 weight 281 pounds height 5 feet 6 inches BMI 45.35  Skin warm and dry.  Nodes none.  TMs without infection.  Neck is supple without JVD thyromegaly or carotid bruits.  Chest clear to auscultation.  Breast without masses.  Cardiac exam regular rate and rhythm normal S1 and S2.  Abdomen is soft nondistended without hepatosplenomegaly masses or tenderness.  Pap taken.  Bimanual normal.  Mood affect and judgment are normal.      Assessment & Plan:  BMI 45-patient will be working with Dr. Adair Patter at [facility].  GE reflux treated with generic Protonix  By records, last colonoscopy was in 2007.  Needs to have repeat colonoscopy  History of asthma treated with inhalers-currently just using albuterol  History of migraine headaches  History of functional constipation  History of impaired glucose tolerance.  Hemoglobin A1c 6.9%.  Will be working with Dr. Adair Patter.  History of eczema treated with Temovate or Lidex cream  History of vitamin D deficiency-currently taking over-the-counter vitamin D.  We did not check this today due to expense.  Dr. Adair Patter  may want to check vitamin D level.  Hypothyroidism treated with thyroid replacement therapy and has stable TSH  Anxiety and insomnia treated with Xanax  Mixed hyperlipidemia-patient currently on Crestor 5 mg 3 times a week and lipids are normal.  History of vitamin D deficiency-currently taking over-the-counter vitamin D daily  Plan: I am glad she is seeing Dr. Adair Patter for weight loss.  Continue current medications and follow-up here in 6 months.  She needs to consider repeat colonoscopy.  Has not had COVID-19 immunizations yet.  Tetanus immunization is up-to-date.  Gets annual flu vaccine.

## 2020-02-26 ENCOUNTER — Encounter: Payer: Self-pay | Admitting: Internal Medicine

## 2020-02-28 ENCOUNTER — Encounter (INDEPENDENT_AMBULATORY_CARE_PROVIDER_SITE_OTHER): Payer: Self-pay | Admitting: Family Medicine

## 2020-02-28 ENCOUNTER — Other Ambulatory Visit: Payer: Self-pay

## 2020-02-28 ENCOUNTER — Ambulatory Visit (INDEPENDENT_AMBULATORY_CARE_PROVIDER_SITE_OTHER): Payer: BC Managed Care – PPO | Admitting: Family Medicine

## 2020-02-28 VITALS — BP 110/68 | HR 76 | Temp 98.2°F | Ht 66.0 in | Wt 268.0 lb

## 2020-02-28 DIAGNOSIS — E1165 Type 2 diabetes mellitus with hyperglycemia: Secondary | ICD-10-CM | POA: Diagnosis not present

## 2020-02-28 DIAGNOSIS — E7849 Other hyperlipidemia: Secondary | ICD-10-CM | POA: Diagnosis not present

## 2020-02-28 DIAGNOSIS — E559 Vitamin D deficiency, unspecified: Secondary | ICD-10-CM

## 2020-02-28 DIAGNOSIS — E038 Other specified hypothyroidism: Secondary | ICD-10-CM

## 2020-02-28 DIAGNOSIS — Z6841 Body Mass Index (BMI) 40.0 and over, adult: Secondary | ICD-10-CM

## 2020-02-28 DIAGNOSIS — E66813 Obesity, class 3: Secondary | ICD-10-CM

## 2020-02-29 NOTE — Progress Notes (Signed)
Chief Complaint:   OBESITY Analiz is here to discuss her progress with her obesity treatment plan along with follow-up of her obesity related diagnoses. Ivannia is on the Category 3 Plan and states she is following her eating plan approximately 90% of the time. Samyrah states she is doing 0 minutes 0 times per week.  Today's visit was #: 2 Starting weight: 278 lbs Starting date: 02/14/2020 Today's weight: 268 lbs Today's date: 02/28/2020 Total lbs lost to date: 10 Total lbs lost since last in-office visit: 10  Interim History: [name] doesn't normally cook at night so its been a transition to [employer]. She has been using up food that is at home. She is wondering if she can do lean cuisine for dinner. She found the quantity of food to be manageable. She got in at least 8 oz of meat at night.  Subjective:   1. Type 2 diabetes mellitus with hyperglycemia, without long-term current use of insulin (HCC) Misa is not on metformin because she has suicidal ideation when taking metformin. Last A1c was 6.3 and insulin 20.7. I discussed labs with the patient today.  2. Other specified hypothyroidism Angala's last TSH was 1.46. She is on levothyroxine 175 mcg.   3. Vitamin D deficiency Raeya reports that her insurance is no longer paying for Vit D levels. She is on OTC Vit D 4,000 IU daily.  4. Other hyperlipidemia Catriona is on Crestor 3 times per week. Last LDL was 93 and she denies myalgias.  Assessment/Plan:   1. Type 2 diabetes mellitus with hyperglycemia, without long-term current use of insulin (HCC) Good blood sugar control is important to decrease the likelihood of diabetic complications such as nephropathy, neuropathy, limb loss, blindness, coronary artery disease, and death. Intensive lifestyle modification including diet, exercise and weight loss are the first line of treatment for diabetes. We will follow up on labs in 3 months. If Shamel's hunger increases or cravings increases, we  will discuss medications.  2. Other specified hypothyroidism Sherby is with long-standing hypothyroidism, and she will continue levothyroxine with no change in dose. She appears euthyroid. Orders and follow up as documented in patient record.  Counseling . Good thyroid control is important for overall health. Supratherapeutic thyroid levels are dangerous and will not improve weight loss results. . The correct way to take levothyroxine is fasting, with water, separated by at least 30 minutes from breakfast, and separated by more than 4 hours from calcium, iron, multivitamins, acid reflux medications (PPIs).   3. Vitamin D deficiency Low Vitamin D level contributes to fatigue and are associated with obesity, breast, and colon cancer. Bonney agreed to continue OTC Vit D and will follow-up for routine testing of Vitamin D, at least 2-3 times per year to avoid over-replacement.  4. Other hyperlipidemia Cardiovascular risk and specific lipid/LDL goals reviewed. We discussed several lifestyle modifications today and Ravan will continue to work on diet, exercise and weight loss efforts. We will repeat labs in 3 months, if her LDL is still elevated then we will increase statin. Orders and follow up as documented in patient record.   Counseling Intensive lifestyle modifications are the first line treatment for this issue. . Dietary changes: Increase soluble fiber. Decrease simple carbohydrates. . Exercise changes: Moderate to vigorous-intensity aerobic activity 150 minutes per week if tolerated. . Lipid-lowering medications: see documented in medical record.  5. Class 3 severe obesity with serious comorbidity and body mass index (BMI) of 40.0 to 44.9 in adult, unspecified  obesity type [facility]) Mariah is currently in the action stage of change. As such, her goal is to continue with weight loss efforts. She has agreed to the Category 3 Plan.   Exercise goals: No exercise has been prescribed at this time.   Behavioral modification strategies: increasing lean protein intake, increasing vegetables, meal planning and cooking strategies and keeping healthy foods in the home.  Adaya has agreed to follow-up with our clinic in 2 weeks. She was informed of the importance of frequent follow-up visits to maximize her success with intensive lifestyle modifications for her multiple health conditions.   Objective:   Blood pressure 110/68, pulse 76, temperature 98.2 F (36.8 C), temperature source Oral, height 5\' 6"  (1.676 m), weight 268 lb (121.6 kg), last menstrual period 03/18/2008, SpO2 96 %. Body mass index is 43.26 kg/m.  General: Cooperative, alert, well developed, in no acute distress. HEENT: Conjunctivae and lids unremarkable. Cardiovascular: Regular rhythm.  Lungs: Normal work of breathing. Neurologic: No focal deficits.   Lab Results  Component Value Date   CREATININE 0.71 02/12/2020   BUN 10 02/12/2020   NA 139 02/12/2020   K 4.5 02/12/2020   CL 104 02/12/2020   CO2 29 02/12/2020   Lab Results  Component Value Date   ALT 23 02/12/2020   AST 18 02/12/2020   ALKPHOS 64 11/06/2016   BILITOT 0.4 02/12/2020   Lab Results  Component Value Date   HGBA1C 6.3 (H) 02/12/2020   HGBA1C 6.9 (H) 11/06/2019   HGBA1C 6.7 (H) 08/07/2019   HGBA1C 6.9 (H) 01/26/2019   HGBA1C 6.4 (H) 09/12/2018   Lab Results  Component Value Date   INSULIN 20.7 02/14/2020   Lab Results  Component Value Date   TSH 1.46 02/12/2020   Lab Results  Component Value Date   CHOL 169 02/12/2020   HDL 56 02/12/2020   LDLCALC 93 02/12/2020   TRIG 104 02/12/2020   CHOLHDL 3.0 02/12/2020   Lab Results  Component Value Date   WBC 6.5 02/12/2020   HGB 12.8 02/12/2020   HCT 38.6 02/12/2020   MCV 81.8 02/12/2020   PLT 275 02/12/2020   Lab Results  Component Value Date   IRON 60 01/14/2018   TIBC 398 01/14/2018   FERRITIN 22 01/14/2018   Attestation Statements:   Reviewed by clinician on day of visit:  allergies, medications, problem list, medical history, surgical history, family history, social history, and previous encounter notes.  Time spent on visit including pre-visit chart review and post-visit care and charting was 30 minutes.    I, Trixie Dredge, am acting as transcriptionist for Coralie Common, MD.  I have reviewed the above documentation for accuracy and completeness, and I agree with the above. - Jinny Blossom, MD

## 2020-03-14 ENCOUNTER — Ambulatory Visit (INDEPENDENT_AMBULATORY_CARE_PROVIDER_SITE_OTHER): Payer: BC Managed Care – PPO | Admitting: Family Medicine

## 2020-03-14 ENCOUNTER — Other Ambulatory Visit: Payer: Self-pay

## 2020-03-14 ENCOUNTER — Encounter (INDEPENDENT_AMBULATORY_CARE_PROVIDER_SITE_OTHER): Payer: Self-pay | Admitting: Family Medicine

## 2020-03-14 VITALS — BP 114/75 | HR 73 | Temp 98.2°F | Ht 66.0 in | Wt 263.0 lb

## 2020-03-14 DIAGNOSIS — Z6841 Body Mass Index (BMI) 40.0 and over, adult: Secondary | ICD-10-CM

## 2020-03-14 DIAGNOSIS — E7849 Other hyperlipidemia: Secondary | ICD-10-CM

## 2020-03-14 DIAGNOSIS — E038 Other specified hypothyroidism: Secondary | ICD-10-CM

## 2020-03-20 NOTE — Progress Notes (Signed)
Chief Complaint:   OBESITY Mckenzie Hebert is here to discuss her progress with her obesity treatment plan along with follow-up of her obesity related diagnoses. [name] is on the Category 3 Plan and states she is following her eating plan approximately 90% of the time. [name] states she is doing 0 minutes 0 times per week.  Today's visit was #: 3 Starting weight: 278 lbs Starting date: 02/14/2020 Today's weight: 263 lbs Today's date: 03/14/2020 Total lbs lost to date: 15 Total lbs lost since last in-office visit: 5  Interim History: [name] voices the last few weeks she has been mostly on track. She has questions about additional breakfast ideas. She did go out for lunch 3 times over the last week. She has slept in a bit more.  Subjective:   1. Other specified hypothyroidism Mckenzie Hebert's last TSH was within normal limits. She denies cold or hot intolerance, or palpitations.  2. Other hyperlipidemia Mckenzie Hebert's last LDL was 90, and she is on Crestor 3 times per week.  Assessment/Plan:   1. Other specified hypothyroidism Patient with long-standing hypothyroidism, and Earlie will continue levothyroxine with no change in dose. She appears euthyroid. Orders and follow up as documented in patient record.  Counseling . Good thyroid control is important for overall health. Supratherapeutic thyroid levels are dangerous and will not improve weight loss results. . The correct way to take levothyroxine is fasting, with water, separated by at least 30 minutes from breakfast, and separated by more than 4 hours from calcium, iron, multivitamins, acid reflux medications (PPIs).   2. Other hyperlipidemia Cardiovascular risk and specific lipid/LDL goals reviewed. We discussed several lifestyle modifications today and [name] will continue to work on diet, exercise and weight loss efforts. We will repeat labs in mid to late September. Orders and follow up as documented in patient record.   Counseling Intensive lifestyle  modifications are the first line treatment for this issue. . Dietary changes: Increase soluble fiber. Decrease simple carbohydrates. . Exercise changes: Moderate to vigorous-intensity aerobic activity 150 minutes per week if tolerated. . Lipid-lowering medications: see documented in medical record.  3. Class 3 severe obesity with serious comorbidity and body mass index (BMI) of 40.0 to 44.9 in adult, unspecified obesity type (HCC) Mckenzie Hebert is currently in the action stage of change. As such, her goal is to continue with weight loss efforts. She has agreed to the Category 3 Plan.   Exercise goals: All adults should avoid inactivity. Some physical activity is better than none, and adults who participate in any amount of physical activity gain some health benefits.  Behavioral modification strategies: increasing lean protein intake, increasing vegetables, meal planning and cooking strategies and keeping healthy foods in the home.  Mckenzie Hebert has agreed to follow-up with our clinic in 2 weeks. She was informed of the importance of frequent follow-up visits to maximize her success with intensive lifestyle modifications for her multiple health conditions.   Objective:   Blood pressure 114/75, pulse 73, temperature 98.2 F (36.8 C), temperature source Oral, height 5\' 6"  (1.676 m), weight 263 lb (119.3 kg), last menstrual period 03/18/2008, SpO2 98 %. Body mass index is 42.45 kg/m.  General: Cooperative, alert, well developed, in no acute distress. HEENT: Conjunctivae and lids unremarkable. Cardiovascular: Regular rhythm.  Lungs: Normal work of breathing. Neurologic: No focal deficits.   Lab Results  Component Value Date   CREATININE 0.71 02/12/2020   BUN 10 02/12/2020   NA 139 02/12/2020   K 4.5 02/12/2020   CL  104 02/12/2020   CO2 29 02/12/2020   Lab Results  Component Value Date   ALT 23 02/12/2020   AST 18 02/12/2020   ALKPHOS 64 11/06/2016   BILITOT 0.4 02/12/2020   Lab Results    Component Value Date   HGBA1C 6.3 (H) 02/12/2020   HGBA1C 6.9 (H) 11/06/2019   HGBA1C 6.7 (H) 08/07/2019   HGBA1C 6.9 (H) 01/26/2019   HGBA1C 6.4 (H) 09/12/2018   Lab Results  Component Value Date   INSULIN 20.7 02/14/2020   Lab Results  Component Value Date   TSH 1.46 02/12/2020   Lab Results  Component Value Date   CHOL 169 02/12/2020   HDL 56 02/12/2020   LDLCALC 93 02/12/2020   TRIG 104 02/12/2020   CHOLHDL 3.0 02/12/2020   Lab Results  Component Value Date   WBC 6.5 02/12/2020   HGB 12.8 02/12/2020   HCT 38.6 02/12/2020   MCV 81.8 02/12/2020   PLT 275 02/12/2020   Lab Results  Component Value Date   IRON 60 01/14/2018   TIBC 398 01/14/2018   FERRITIN 22 01/14/2018   Attestation Statements:   Reviewed by clinician on day of visit: allergies, medications, problem list, medical history, surgical history, family history, social history, and previous encounter notes.  Time spent on visit including pre-visit chart review and post-visit care and charting was 15 minutes.    I, Trixie Dredge, am acting as transcriptionist for Coralie Common, MD.  I have reviewed the above documentation for accuracy and completeness, and I agree with the above. - Jinny Blossom, MD

## 2020-03-28 ENCOUNTER — Encounter (INDEPENDENT_AMBULATORY_CARE_PROVIDER_SITE_OTHER): Payer: Self-pay | Admitting: Family Medicine

## 2020-03-28 ENCOUNTER — Ambulatory Visit (INDEPENDENT_AMBULATORY_CARE_PROVIDER_SITE_OTHER): Payer: BC Managed Care – PPO | Admitting: Family Medicine

## 2020-03-28 ENCOUNTER — Other Ambulatory Visit: Payer: Self-pay

## 2020-03-28 VITALS — BP 116/72 | HR 73 | Temp 98.2°F | Ht 66.0 in | Wt 258.0 lb

## 2020-03-28 DIAGNOSIS — Z6841 Body Mass Index (BMI) 40.0 and over, adult: Secondary | ICD-10-CM | POA: Diagnosis not present

## 2020-03-28 DIAGNOSIS — E038 Other specified hypothyroidism: Secondary | ICD-10-CM | POA: Diagnosis not present

## 2020-03-28 DIAGNOSIS — E66813 Obesity, class 3: Secondary | ICD-10-CM

## 2020-03-28 DIAGNOSIS — E1165 Type 2 diabetes mellitus with hyperglycemia: Secondary | ICD-10-CM | POA: Diagnosis not present

## 2020-04-02 NOTE — Progress Notes (Signed)
Chief Complaint:   OBESITY Mckenzie Hebert is here to discuss her progress with her obesity treatment plan along with follow-up of her obesity related diagnoses. Mckenzie Hebert is on the Category 3 Plan and states she is following her eating plan approximately 90% of the time. Mckenzie Hebert states she is doing 0 minutes 0 times per week.  Today's visit was #: 4 Starting weight: 278 lbs Starting date: 02/14/2020 Today's weight: 258 lbs Today's date: 03/28/2020 Total lbs lost to date: 20 Total lbs lost since last in-office visit: 5  Interim History: Mckenzie Hebert has been following the meal plan fairly strictly except for 1 instance of eating off the plan, with eating chicken tenders with ranch. She did add pork into the meal plan this past 2 weeks. For snack she is doing mayo on sandwich, sugar free popsicles, and light cheese. She is not planning anything for her birthday.  Subjective:   1. Other specified hypothyroidism Mckenzie Hebert's last TSH was 1.46. She is on levothyroxine 175 mcg daily.  2. Type 2 diabetes mellitus with hyperglycemia, without long-term current use of insulin (HCC) Mckenzie Hebert's last A1c was 6.3. She is not on medications.  Assessment/Plan:   1. Other specified hypothyroidism Patient with long-standing hypothyroidism, on levothyroxine therapy. Mckenzie Hebert appears euthyroid. We will repeat thyroid panel in mid September. Orders and follow up as documented in patient record.  Counseling . Good thyroid control is important for overall health. Supratherapeutic thyroid levels are dangerous and will not improve weight loss results. . The correct way to take levothyroxine is fasting, with water, separated by at least 30 minutes from breakfast, and separated by more than 4 hours from calcium, iron, multivitamins, acid reflux medications (PPIs).   2. Type 2 diabetes mellitus with hyperglycemia, without long-term current use of insulin (HCC) Good blood sugar control is important to decrease the likelihood of diabetic  complications such as nephropathy, neuropathy, limb loss, blindness, coronary artery disease, and death. Intensive lifestyle modification including diet, exercise and weight loss are the first line of treatment for diabetes. We will repeat labs in mid September. Toria will continue to follow up as directed.  3. Class 3 severe obesity with serious comorbidity and body mass index (BMI) of 40.0 to 44.9 in adult, unspecified obesity type (HCC) Mckenzie Hebert is currently in the action stage of change. As such, her goal is to continue with weight loss efforts. She has agreed to the Category 3 Plan and keeping a food journal and adhering to recommended goals of 450-600 calories and 40+ grams of protein at supper daily.   Exercise goals: No exercise has been prescribed at this time.  Behavioral modification strategies: increasing lean protein intake, meal planning and cooking strategies, keeping healthy foods in the home and planning for success.  Mckenzie Hebert has agreed to follow-up with our clinic in 2 to 3 weeks. She was informed of the importance of frequent follow-up visits to maximize her success with intensive lifestyle modifications for her multiple health conditions.   Objective:   Blood pressure 116/72, pulse 73, temperature 98.2 F (36.8 C), temperature source Oral, height 5\' 6"  (1.676 m), weight (!) 258 lb (117 kg), last menstrual period 03/18/2008, SpO2 95 %. Body mass index is 41.64 kg/m.  General: Cooperative, alert, well developed, in no acute distress. HEENT: Conjunctivae and lids unremarkable. Cardiovascular: Regular rhythm.  Lungs: Normal work of breathing. Neurologic: No focal deficits.   Lab Results  Component Value Date   CREATININE 0.71 02/12/2020   BUN 10 02/12/2020  NA 139 02/12/2020   K 4.5 02/12/2020   CL 104 02/12/2020   CO2 29 02/12/2020   Lab Results  Component Value Date   ALT 23 02/12/2020   AST 18 02/12/2020   ALKPHOS 64 11/06/2016   BILITOT 0.4 02/12/2020   Lab  Results  Component Value Date   HGBA1C 6.3 (H) 02/12/2020   HGBA1C 6.9 (H) 11/06/2019   HGBA1C 6.7 (H) 08/07/2019   HGBA1C 6.9 (H) 01/26/2019   HGBA1C 6.4 (H) 09/12/2018   Lab Results  Component Value Date   INSULIN 20.7 02/14/2020   Lab Results  Component Value Date   TSH 1.46 02/12/2020   Lab Results  Component Value Date   CHOL 169 02/12/2020   HDL 56 02/12/2020   LDLCALC 93 02/12/2020   TRIG 104 02/12/2020   CHOLHDL 3.0 02/12/2020   Lab Results  Component Value Date   WBC 6.5 02/12/2020   HGB 12.8 02/12/2020   HCT 38.6 02/12/2020   MCV 81.8 02/12/2020   PLT 275 02/12/2020   Lab Results  Component Value Date   IRON 60 01/14/2018   TIBC 398 01/14/2018   FERRITIN 22 01/14/2018   Attestation Statements:   Reviewed by clinician on day of visit: allergies, medications, problem list, medical history, surgical history, family history, social history, and previous encounter notes.  Time spent on visit including pre-visit chart review and post-visit care and charting was 17 minutes.    I, Trixie Dredge, am acting as transcriptionist for Coralie Common, MD.  I have reviewed the above documentation for accuracy and completeness, and I agree with the above. - Jinny Blossom, MD

## 2020-04-16 ENCOUNTER — Other Ambulatory Visit: Payer: Self-pay

## 2020-04-16 ENCOUNTER — Encounter (INDEPENDENT_AMBULATORY_CARE_PROVIDER_SITE_OTHER): Payer: Self-pay | Admitting: Family Medicine

## 2020-04-16 ENCOUNTER — Ambulatory Visit (INDEPENDENT_AMBULATORY_CARE_PROVIDER_SITE_OTHER): Payer: BC Managed Care – PPO | Admitting: Family Medicine

## 2020-04-16 VITALS — BP 115/73 | HR 79 | Temp 98.7°F | Ht 66.0 in | Wt 254.0 lb

## 2020-04-16 DIAGNOSIS — E785 Hyperlipidemia, unspecified: Secondary | ICD-10-CM

## 2020-04-16 DIAGNOSIS — E1165 Type 2 diabetes mellitus with hyperglycemia: Secondary | ICD-10-CM | POA: Diagnosis not present

## 2020-04-16 DIAGNOSIS — Z6841 Body Mass Index (BMI) 40.0 and over, adult: Secondary | ICD-10-CM

## 2020-04-16 DIAGNOSIS — E1169 Type 2 diabetes mellitus with other specified complication: Secondary | ICD-10-CM | POA: Diagnosis not present

## 2020-04-18 NOTE — Progress Notes (Signed)
Chief Complaint:   OBESITY Leeza is here to discuss her progress with her obesity treatment plan along with follow-up of her obesity related diagnoses. Thayer is on the Category 3 Plan and keeping a food journal and adhering to recommended goals of 450-600 calories and 40+ grams of protein at supper daily and states she is following her eating plan approximately 70% of the time. Rae states she is doing 0 minutes 0 times per week.  Today's visit was #: 5 Starting weight: 278 lbs Starting date: 02/14/2020 Today's weight: 254 lbs Today's date: 04/17/2020 Total lbs lost to date: 24 Total lbs lost since last in-office visit: 4  Interim History: Welma had a birthday where she went out to dinner a few times. She has been experiencing some hematochezia which she voices she has a GI appointment next week. She doesn't see any obstacles in the upcoming few weeks. She is occasionally feeling cravings for sweets when she had sweets for her birthday.  Subjective:   1. Type 2 diabetes mellitus with hyperglycemia, without long-term current use of insulin ([place]) Maanasa voices she experienced no feeling of hypoglycemia. She is not on medications.  2. Hyperlipidemia associated with type 2 diabetes mellitus ([place]) Yuritza had experienced fairly significant aches and muscle aches, so she stopped Crestor.  Assessment/Plan:   1. Type 2 diabetes mellitus with hyperglycemia, without long-term current use of insulin (HCC) Good blood sugar control is important to decrease the likelihood of diabetic complications such as nephropathy, neuropathy, limb loss, blindness, coronary artery disease, and death. Intensive lifestyle modification including diet, exercise and weight loss are the first line of treatment for diabetes. We will repeat labs in 1.5 months, and Verginia will follow up as directed.  2. Hyperlipidemia associated with type 2 diabetes mellitus ([place]) Cardiovascular risk and specific lipid/LDL goals reviewed.   We discussed several lifestyle modifications today and Nancye will continue to work on diet, exercise and weight loss efforts. We will repeat labs in mid to late September. Orders and follow up as documented in patient record.   Counseling Intensive lifestyle modifications are the first line treatment for this issue. . Dietary changes: Increase soluble fiber. Decrease simple carbohydrates. . Exercise changes: Moderate to vigorous-intensity aerobic activity 150 minutes per week if tolerated. . Lipid-lowering medications: see documented in medical record.  3. Class 3 severe obesity with serious comorbidity and body mass index (BMI) of 40.0 to 44.9 in adult, unspecified obesity type (HCC) Cailyn is currently in the action stage of change. As such, her goal is to continue with weight loss efforts. She has agreed to the Category 3 Plan and keeping a food journal and adhering to recommended goals of 450-600 calories and 40+ grams of protein at supper daily.   Exercise goals: All adults should avoid inactivity. Some physical activity is better than none, and adults who participate in any amount of physical activity gain some health benefits.  Behavioral modification strategies: increasing lean protein intake, increasing vegetables, meal planning and cooking strategies, keeping healthy foods in the home and planning for success.  Tishie has agreed to follow-up with our clinic in 3 weeks. She was informed of the importance of frequent follow-up visits to maximize her success with intensive lifestyle modifications for her multiple health conditions.   Objective:   Blood pressure 115/73, pulse 79, temperature 98.7 F (37.1 C), temperature source Oral, height 5\' 6"  (1.676 m), weight 254 lb (115.2 kg), last menstrual period 03/18/2008, SpO2 97 %. Body mass index  is 41 kg/m.  General: Cooperative, alert, well developed, in no acute distress. HEENT: Conjunctivae and lids unremarkable. Cardiovascular:  Regular rhythm.  Lungs: Normal work of breathing. Neurologic: No focal deficits.   Lab Results  Component Value Date   CREATININE 0.71 02/12/2020   BUN 10 02/12/2020   NA 139 02/12/2020   K 4.5 02/12/2020   CL 104 02/12/2020   CO2 29 02/12/2020   Lab Results  Component Value Date   ALT 23 02/12/2020   AST 18 02/12/2020   ALKPHOS 64 11/06/2016   BILITOT 0.4 02/12/2020   Lab Results  Component Value Date   HGBA1C 6.3 (H) 02/12/2020   HGBA1C 6.9 (H) 11/06/2019   HGBA1C 6.7 (H) 08/07/2019   HGBA1C 6.9 (H) 01/26/2019   HGBA1C 6.4 (H) 09/12/2018   Lab Results  Component Value Date   INSULIN 20.7 02/14/2020   Lab Results  Component Value Date   TSH 1.46 02/12/2020   Lab Results  Component Value Date   CHOL 169 02/12/2020   HDL 56 02/12/2020   LDLCALC 93 02/12/2020   TRIG 104 02/12/2020   CHOLHDL 3.0 02/12/2020   Lab Results  Component Value Date   WBC 6.5 02/12/2020   HGB 12.8 02/12/2020   HCT 38.6 02/12/2020   MCV 81.8 02/12/2020   PLT 275 02/12/2020   Lab Results  Component Value Date   IRON 60 01/14/2018   TIBC 398 01/14/2018   FERRITIN 22 01/14/2018   Attestation Statements:   Reviewed by clinician on day of visit: allergies, medications, problem list, medical history, surgical history, family history, social history, and previous encounter notes.  Time spent on visit including pre-visit chart review and post-visit care and charting was 15 minutes.   I, Trixie Dredge, am acting as transcriptionist for Coralie Common, MD.  I have reviewed the above documentation for accuracy and completeness, and I agree with the above. - Jinny Blossom, MD

## 2020-04-23 DIAGNOSIS — K625 Hemorrhage of anus and rectum: Secondary | ICD-10-CM | POA: Diagnosis not present

## 2020-04-23 DIAGNOSIS — K642 Third degree hemorrhoids: Secondary | ICD-10-CM | POA: Diagnosis not present

## 2020-04-23 DIAGNOSIS — K5900 Constipation, unspecified: Secondary | ICD-10-CM | POA: Diagnosis not present

## 2020-04-23 DIAGNOSIS — K5730 Diverticulosis of large intestine without perforation or abscess without bleeding: Secondary | ICD-10-CM | POA: Diagnosis not present

## 2020-04-29 ENCOUNTER — Other Ambulatory Visit: Payer: Self-pay | Admitting: Internal Medicine

## 2020-04-29 DIAGNOSIS — Z1231 Encounter for screening mammogram for malignant neoplasm of breast: Secondary | ICD-10-CM

## 2020-04-30 ENCOUNTER — Other Ambulatory Visit: Payer: Self-pay

## 2020-04-30 ENCOUNTER — Encounter (INDEPENDENT_AMBULATORY_CARE_PROVIDER_SITE_OTHER): Payer: Self-pay | Admitting: Family Medicine

## 2020-04-30 ENCOUNTER — Ambulatory Visit (INDEPENDENT_AMBULATORY_CARE_PROVIDER_SITE_OTHER): Payer: BC Managed Care – PPO | Admitting: Family Medicine

## 2020-04-30 VITALS — BP 119/72 | HR 90 | Temp 98.0°F | Ht 66.0 in | Wt 251.0 lb

## 2020-04-30 DIAGNOSIS — E559 Vitamin D deficiency, unspecified: Secondary | ICD-10-CM

## 2020-04-30 DIAGNOSIS — Z6841 Body Mass Index (BMI) 40.0 and over, adult: Secondary | ICD-10-CM

## 2020-04-30 DIAGNOSIS — E1165 Type 2 diabetes mellitus with hyperglycemia: Secondary | ICD-10-CM

## 2020-04-30 NOTE — Progress Notes (Signed)
Chief Complaint:   OBESITY Mckenzie Hebert is here to discuss her progress with her obesity treatment plan along with follow-up of her obesity related diagnoses. Mckenzie Hebert is on the Category 3 Plan and keeping a food journal and adhering to recommended goals of 450-600 calories and 40+ grams of protein at supper daily and states she is following her eating plan approximately 90% of the time. Mckenzie Hebert states she is doing 0 minutes 0 times per week.  Today's visit was #: 6 Starting weight: 278 lbs Starting date: 02/14/2020 Today's weight: 251 lbs Today's date: 04/30/2020 Total lbs lost to date: 27  Total lbs lost since last in-office visit: 3  Interim History: Mckenzie Hebert voices that she has an orthopedic appointment tomorrow for another knee injection. She has had 2 birthday parties the last few weeks, so she had eaten off the plan at those times. She has a baby shower to attend this weekend. She hasn't tried to put meals together itself yet.  Subjective:   1. Vitamin D deficiency Mckenzie Hebert is on OTC Vit D, and she notes fatigue. She denies nausea, vomiting, or muscle weakness.  2. Type 2 diabetes mellitus with hyperglycemia, without long-term current use of insulin (HCC) Mckenzie Hebert is not on metformin. Last A1c was 6.3 and insulin 20.7.  Assessment/Plan:   1. Vitamin D deficiency Low Vitamin D level contributes to fatigue and are associated with obesity, breast, and colon cancer. Jacalynn agreed to continue taking OTC Vitamin D, no change in dose. She will follow-up for routine testing of Vitamin D, at least 2-3 times per year to avoid over-replacement.  2. Type 2 diabetes mellitus with hyperglycemia, without long-term current use of insulin (HCC) Good blood sugar control is important to decrease the likelihood of diabetic complications such as nephropathy, neuropathy, limb loss, blindness, coronary artery disease, and death. Intensive lifestyle modification including diet, exercise and weight loss are the first line  of treatment for diabetes. Mckenzie Hebert will continue her Category 3 plan and will follow up as directed.  3. Class 3 severe obesity with serious comorbidity and body mass index (BMI) of 40.0 to 44.9 in adult, unspecified obesity type (HCC) Mckenzie Hebert is currently in the action stage of change. As such, her goal is to continue with weight loss efforts. She has agreed to the Category 3 Plan and keeping a food journal and adhering to recommended goals of 450-600 calories and 40+ grams of protein at supper daily.   Exercise goals: No exercise has been prescribed at this time.  Behavioral modification strategies: increasing lean protein intake, meal planning and cooking strategies, keeping healthy foods in the home and planning for success.  Mckenzie Hebert has agreed to follow-up with our clinic in 2 weeks. She was informed of the importance of frequent follow-up visits to maximize her success with intensive lifestyle modifications for her multiple health conditions.   Objective:   Blood pressure 119/72, pulse 90, temperature 98 F (36.7 C), temperature source Oral, height 5\' 6"  (1.676 m), weight 251 lb (113.9 kg), last menstrual period 03/18/2008, SpO2 98 %. Body mass index is 40.51 kg/m.  General: Cooperative, alert, well developed, in no acute distress. HEENT: Conjunctivae and lids unremarkable. Cardiovascular: Regular rhythm.  Lungs: Normal work of breathing. Neurologic: No focal deficits.   Lab Results  Component Value Date   CREATININE 0.71 02/12/2020   BUN 10 02/12/2020   NA 139 02/12/2020   K 4.5 02/12/2020   CL 104 02/12/2020   CO2 29 02/12/2020   Lab Results  Component Value Date   ALT 23 02/12/2020   AST 18 02/12/2020   ALKPHOS 64 11/06/2016   BILITOT 0.4 02/12/2020   Lab Results  Component Value Date   HGBA1C 6.3 (H) 02/12/2020   HGBA1C 6.9 (H) 11/06/2019   HGBA1C 6.7 (H) 08/07/2019   HGBA1C 6.9 (H) 01/26/2019   HGBA1C 6.4 (H) 09/12/2018   Lab Results  Component Value Date    INSULIN 20.7 02/14/2020   Lab Results  Component Value Date   TSH 1.46 02/12/2020   Lab Results  Component Value Date   CHOL 169 02/12/2020   HDL 56 02/12/2020   LDLCALC 93 02/12/2020   TRIG 104 02/12/2020   CHOLHDL 3.0 02/12/2020   Lab Results  Component Value Date   WBC 6.5 02/12/2020   HGB 12.8 02/12/2020   HCT 38.6 02/12/2020   MCV 81.8 02/12/2020   PLT 275 02/12/2020   Lab Results  Component Value Date   IRON 60 01/14/2018   TIBC 398 01/14/2018   FERRITIN 22 01/14/2018   Attestation Statements:   Reviewed by clinician on day of visit: allergies, medications, problem list, medical history, surgical history, family history, social history, and previous encounter notes.  Time spent on visit including pre-visit chart review and post-visit care and charting was 16 minutes.    I, Trixie Dredge, am acting as transcriptionist for Coralie Common, MD.  I have reviewed the above documentation for accuracy and completeness, and I agree with the above. - Jinny Blossom, MD

## 2020-05-01 DIAGNOSIS — M1712 Unilateral primary osteoarthritis, left knee: Secondary | ICD-10-CM | POA: Diagnosis not present

## 2020-05-14 ENCOUNTER — Other Ambulatory Visit: Payer: Self-pay

## 2020-05-14 MED ORDER — ALBUTEROL SULFATE HFA 108 (90 BASE) MCG/ACT IN AERS: 2.0000 | INHALATION_SPRAY | Freq: Four times a day (QID) | RESPIRATORY_TRACT | 11 refills | Status: DC | PRN

## 2020-05-16 ENCOUNTER — Ambulatory Visit (INDEPENDENT_AMBULATORY_CARE_PROVIDER_SITE_OTHER): Payer: BC Managed Care – PPO | Admitting: Family Medicine

## 2020-05-16 ENCOUNTER — Other Ambulatory Visit: Payer: Self-pay

## 2020-05-16 ENCOUNTER — Encounter (INDEPENDENT_AMBULATORY_CARE_PROVIDER_SITE_OTHER): Payer: Self-pay | Admitting: Family Medicine

## 2020-05-16 VITALS — BP 116/75 | HR 76 | Temp 98.1°F | Ht 66.0 in | Wt 245.0 lb

## 2020-05-16 DIAGNOSIS — K921 Melena: Secondary | ICD-10-CM | POA: Diagnosis not present

## 2020-05-16 DIAGNOSIS — E039 Hypothyroidism, unspecified: Secondary | ICD-10-CM | POA: Diagnosis not present

## 2020-05-16 DIAGNOSIS — Z6839 Body mass index (BMI) 39.0-39.9, adult: Secondary | ICD-10-CM | POA: Diagnosis not present

## 2020-05-17 NOTE — Progress Notes (Signed)
Chief Complaint:   OBESITY Mckenzie Hebert is here to discuss her progress with her obesity treatment plan along with follow-up of her obesity related diagnoses. Mckenzie Hebert is on the Category 3 Plan and keeping a food journal and adhering to recommended goals of 450-600 calories and 40+ grams of protein at supper daily and states she is following her eating plan approximately 75% of the time. Mckenzie Hebert states she is doing 0 minutes 0 times per week.  Today's visit was #: 7 Starting weight: 278 lbs Starting date: 02/14/2020 Today's weight: 245 lbs Today's date: 05/16/2020 Total lbs lost to date: 33 Total lbs lost since last in-office visit: 6  Interim History: Mckenzie Hebert saw her Orthopedist and Gastroenterologist over the last few weeks, and she did not note any changes in her  Current plans for surgery. She voices some financial constraint with coming 2 times per month. She has felt somewhat lightheaded sporadically but no consistency.  Subjective:   1. Hypothyroidism, unspecified type Mckenzie Hebert's last TSH was within normal limits. She is on levothyroxine 175 mcg. She denies cold/heat intolerance or palpitations.  2. Hematochezia Mckenzie Hebert saw her GI provider, and bleeding is hemorrhoidal but she voices her bleeding has stopped.  Assessment/Plan:   1. Hypothyroidism, unspecified type Mckenzie Hebert is with long-standing hypothyroidism, no change to levothyroxine at this time. We will repeat labs in 1 month. Orders and follow up as documented in patient record.  2. Hematochezia Mckenzie Hebert will follow up at her subsequent appointments. If she starts experiencing symptoms again then we will refer to GI.   3. Class 2 severe obesity with serious comorbidity and body mass index (BMI) of 39.0 to 39.9 in adult, unspecified obesity type (HCC) Mckenzie Hebert is currently in the action stage of change. As such, her goal is to continue with weight loss efforts. She has agreed to the Category 3 Plan.   Exercise goals: All adults should avoid  inactivity. Some physical activity is better than none, and adults who participate in any amount of physical activity gain some health benefits.  Behavioral modification strategies: increasing lean protein intake, meal planning and cooking strategies, keeping healthy foods in the home and planning for success.  Mckenzie Hebert has agreed to follow-up with our clinic in 4 weeks. She was informed of the importance of frequent follow-up visits to maximize her success with intensive lifestyle modifications for her multiple health conditions.   Objective:   Blood pressure 116/75, pulse 76, temperature 98.1 F (36.7 C), temperature source Oral, height 5\' 6"  (1.676 m), weight 245 lb (111.1 kg), last menstrual period 03/18/2008, SpO2 96 %. Body mass index is 39.54 kg/m.  General: Cooperative, alert, well developed, in no acute distress. HEENT: Conjunctivae and lids unremarkable. Cardiovascular: Regular rhythm.  Lungs: Normal work of breathing. Neurologic: No focal deficits.   Lab Results  Component Value Date   CREATININE 0.71 02/12/2020   BUN 10 02/12/2020   NA 139 02/12/2020   K 4.5 02/12/2020   CL 104 02/12/2020   CO2 29 02/12/2020   Lab Results  Component Value Date   ALT 23 02/12/2020   AST 18 02/12/2020   ALKPHOS 64 11/06/2016   BILITOT 0.4 02/12/2020   Lab Results  Component Value Date   HGBA1C 6.3 (H) 02/12/2020   HGBA1C 6.9 (H) 11/06/2019   HGBA1C 6.7 (H) 08/07/2019   HGBA1C 6.9 (H) 01/26/2019   HGBA1C 6.4 (H) 09/12/2018   Lab Results  Component Value Date   INSULIN 20.7 02/14/2020   Lab Results  Component Value Date   TSH 1.46 02/12/2020   Lab Results  Component Value Date   CHOL 169 02/12/2020   HDL 56 02/12/2020   LDLCALC 93 02/12/2020   TRIG 104 02/12/2020   CHOLHDL 3.0 02/12/2020   Lab Results  Component Value Date   WBC 6.5 02/12/2020   HGB 12.8 02/12/2020   HCT 38.6 02/12/2020   MCV 81.8 02/12/2020   PLT 275 02/12/2020   Lab Results  Component Value  Date   IRON 60 01/14/2018   TIBC 398 01/14/2018   FERRITIN 22 01/14/2018   Attestation Statements:   Reviewed by clinician on day of visit: allergies, medications, problem list, medical history, surgical history, family history, social history, and previous encounter notes.  Time spent on visit including pre-visit chart review and post-visit care and charting was 16 minutes.    I, Trixie Dredge, am acting as transcriptionist for Coralie Common, MD.  I have reviewed the above documentation for accuracy and completeness, and I agree with the above. - Jinny Blossom, MD

## 2020-05-20 ENCOUNTER — Encounter: Payer: BC Managed Care – PPO | Admitting: Nurse Practitioner

## 2020-05-22 ENCOUNTER — Other Ambulatory Visit: Payer: Self-pay | Admitting: Internal Medicine

## 2020-05-24 ENCOUNTER — Other Ambulatory Visit: Payer: Self-pay

## 2020-05-24 ENCOUNTER — Ambulatory Visit
Admission: RE | Admit: 2020-05-24 | Discharge: 2020-05-24 | Disposition: A | Discharge status: Home / Self Care | Payer: BC Managed Care – PPO | Source: Ambulatory Visit | Attending: Internal Medicine | Admitting: Internal Medicine

## 2020-05-24 DIAGNOSIS — Z1231 Encounter for screening mammogram for malignant neoplasm of breast: Secondary | ICD-10-CM | POA: Diagnosis not present

## 2020-06-03 DIAGNOSIS — M542 Cervicalgia: Secondary | ICD-10-CM | POA: Diagnosis not present

## 2020-06-13 ENCOUNTER — Encounter (INDEPENDENT_AMBULATORY_CARE_PROVIDER_SITE_OTHER): Payer: Self-pay | Admitting: Family Medicine

## 2020-06-13 ENCOUNTER — Ambulatory Visit (INDEPENDENT_AMBULATORY_CARE_PROVIDER_SITE_OTHER): Payer: BC Managed Care – PPO | Admitting: Family Medicine

## 2020-06-13 ENCOUNTER — Other Ambulatory Visit: Payer: Self-pay

## 2020-06-13 VITALS — BP 114/77 | HR 73 | Temp 98.0°F | Ht 66.0 in | Wt 237.0 lb

## 2020-06-13 DIAGNOSIS — E038 Other specified hypothyroidism: Secondary | ICD-10-CM

## 2020-06-13 DIAGNOSIS — E1165 Type 2 diabetes mellitus with hyperglycemia: Secondary | ICD-10-CM

## 2020-06-13 DIAGNOSIS — Z6838 Body mass index (BMI) 38.0-38.9, adult: Secondary | ICD-10-CM | POA: Diagnosis not present

## 2020-06-17 NOTE — Progress Notes (Signed)
Chief Complaint:   OBESITY Mckenzie Hebert is here to discuss her progress with her obesity treatment plan along with follow-up of her obesity related diagnoses. Mckenzie Hebert is on the Category 3 Plan and states she is following her eating plan approximately 80% of the time. [name] states she is doing arm weights for 15 minutes 2 times per week.  Today's visit was #: 8 Starting weight: 278 lbs Starting date: 02/14/2020 Today's weight: 237 lbs Today's date: 06/13/2020 Total lbs lost to date: 41 Total lbs lost since last in-office visit: 8  Interim History: Mckenzie Hebert has been feeling done with meat. She voices she thinks this will be her last time coming. She has been doing some of her own plan, such as cut back on meat significantly. She feels full even with decreased meat quantity. She doesn't feels that bad in terms of knee pain.  Subjective:   1. Other specified hypothyroidism Mckenzie Hebert denies cold/heat intolerance or palpitations. She is on levothyroxine.  2. Type 2 diabetes mellitus with hyperglycemia, without long-term current use of insulin (HCC) Mckenzie Hebert's last A1c was 6.3. She is not on medications.   Assessment/Plan:   1. Other specified hypothyroidism Patient with long-standing hypothyroidism. Caidence will continue levothyroxine, with no change in dose. She will have repeat labs in December with her primary care physician. She appears euthyroid. Orders and follow up as documented in patient record.  Counseling . Good thyroid control is important for overall health. Supratherapeutic thyroid levels are dangerous and will not improve weight loss results. . The correct way to take levothyroxine is fasting, with water, separated by at least 30 minutes from breakfast, and separated by more than 4 hours from calcium, iron, multivitamins, acid reflux medications (PPIs).   2. Type 2 diabetes mellitus with hyperglycemia, without long-term current use of insulin (HCC) Good blood sugar control is important to  decrease the likelihood of diabetic complications such as nephropathy, neuropathy, limb loss, blindness, coronary artery disease, and death. Intensive lifestyle modification including diet, exercise and weight loss are the first line of treatment for diabetes. She will have repeat labs in December with her primary care physician. [name] is to try the Vegetarian plan.  3. Class 2 severe obesity with serious comorbidity and body mass index (BMI) of 38.0 to 38.9 in adult, unspecified obesity type (HCC) Mckenzie Hebert is currently in the action stage of change. As such, her goal is to continue with weight loss efforts. She has agreed to the [place] + 300 calories.   Exercise goals: As is.  Behavioral modification strategies: increasing lean protein intake, meal planning and cooking strategies, keeping healthy foods in the home and planning for success.  Mckenzie Hebert has agreed to follow-up with our clinic in 4 weeks. She was informed of the importance of frequent follow-up visits to maximize her success with intensive lifestyle modifications for her multiple health conditions.   Objective:   Blood pressure 114/77, pulse 73, temperature 98 F (36.7 C), temperature source Oral, height 5\' 6"  (1.676 m), weight 237 lb (107.5 kg), last menstrual period 03/18/2008, SpO2 98 %. Body mass index is 38.25 kg/m.  General: Cooperative, alert, well developed, in no acute distress. HEENT: Conjunctivae and lids unremarkable. Cardiovascular: Regular rhythm.  Lungs: Normal work of breathing. Neurologic: No focal deficits.   Lab Results  Component Value Date   CREATININE 0.71 02/12/2020   BUN 10 02/12/2020   NA 139 02/12/2020   K 4.5 02/12/2020   CL 104 02/12/2020   CO2 29 02/12/2020  Lab Results  Component Value Date   ALT 23 02/12/2020   AST 18 02/12/2020   ALKPHOS 64 11/06/2016   BILITOT 0.4 02/12/2020   Lab Results  Component Value Date   HGBA1C 6.3 (H) 02/12/2020   HGBA1C 6.9 (H) 11/06/2019   HGBA1C  6.7 (H) 08/07/2019   HGBA1C 6.9 (H) 01/26/2019   HGBA1C 6.4 (H) 09/12/2018   Lab Results  Component Value Date   INSULIN 20.7 02/14/2020   Lab Results  Component Value Date   TSH 1.46 02/12/2020   Lab Results  Component Value Date   CHOL 169 02/12/2020   HDL 56 02/12/2020   LDLCALC 93 02/12/2020   TRIG 104 02/12/2020   CHOLHDL 3.0 02/12/2020   Lab Results  Component Value Date   WBC 6.5 02/12/2020   HGB 12.8 02/12/2020   HCT 38.6 02/12/2020   MCV 81.8 02/12/2020   PLT 275 02/12/2020   Lab Results  Component Value Date   IRON 60 01/14/2018   TIBC 398 01/14/2018   FERRITIN 22 01/14/2018   Attestation Statements:   Reviewed by clinician on day of visit: allergies, medications, problem list, medical history, surgical history, family history, social history, and previous encounter notes.  Time spent on visit including pre-visit chart review and post-visit care and charting was 15 minutes.    I, Trixie Dredge, am acting as transcriptionist for Coralie Common, MD.  I have reviewed the above documentation for accuracy and completeness, and I agree with the above. - Jinny Blossom, MD

## 2020-06-25 DIAGNOSIS — L820 Inflamed seborrheic keratosis: Secondary | ICD-10-CM | POA: Diagnosis not present

## 2020-06-25 DIAGNOSIS — L821 Other seborrheic keratosis: Secondary | ICD-10-CM | POA: Diagnosis not present

## 2020-06-25 DIAGNOSIS — D171 Benign lipomatous neoplasm of skin and subcutaneous tissue of trunk: Secondary | ICD-10-CM | POA: Diagnosis not present

## 2020-06-25 DIAGNOSIS — L7211 Pilar cyst: Secondary | ICD-10-CM | POA: Diagnosis not present

## 2020-07-11 ENCOUNTER — Ambulatory Visit (INDEPENDENT_AMBULATORY_CARE_PROVIDER_SITE_OTHER): Payer: BC Managed Care – PPO | Admitting: Family Medicine

## 2020-07-21 ENCOUNTER — Other Ambulatory Visit: Payer: Self-pay | Admitting: Internal Medicine

## 2020-07-26 ENCOUNTER — Other Ambulatory Visit: Payer: BC Managed Care – PPO | Admitting: Internal Medicine

## 2020-07-26 ENCOUNTER — Other Ambulatory Visit: Payer: Self-pay

## 2020-07-26 DIAGNOSIS — E1169 Type 2 diabetes mellitus with other specified complication: Secondary | ICD-10-CM | POA: Diagnosis not present

## 2020-07-26 DIAGNOSIS — E1165 Type 2 diabetes mellitus with hyperglycemia: Secondary | ICD-10-CM

## 2020-07-26 DIAGNOSIS — E039 Hypothyroidism, unspecified: Secondary | ICD-10-CM

## 2020-07-26 DIAGNOSIS — E038 Other specified hypothyroidism: Secondary | ICD-10-CM

## 2020-07-26 DIAGNOSIS — E7849 Other hyperlipidemia: Secondary | ICD-10-CM

## 2020-07-26 DIAGNOSIS — E785 Hyperlipidemia, unspecified: Secondary | ICD-10-CM

## 2020-07-27 LAB — LIPID PANEL
Cholesterol: 223 mg/dL — ABNORMAL HIGH (ref <200)
HDL: 54 mg/dL (ref >=50)
LDL Cholesterol (Calc): 147 mg/dL (calc) — ABNORMAL HIGH
Non-HDL Cholesterol (Calc): 169 mg/dL (calc) — ABNORMAL HIGH (ref <130)
Total CHOL/HDL Ratio: 4.1 (calc) (ref <5.0)
Triglycerides: 107 mg/dL (ref <150)

## 2020-07-27 LAB — HEMOGLOBIN A1C
Hgb A1c MFr Bld: 5.9 % of total Hgb — ABNORMAL HIGH (ref <5.7)
Mean Plasma Glucose: 123 (calc)
eAG (mmol/L): 6.8 (calc)

## 2020-07-27 LAB — HEPATIC FUNCTION PANEL
AG Ratio: 1.6 (calc) (ref 1.0–2.5)
ALT: 24 U/L (ref 6–29)
AST: 19 U/L (ref 10–35)
Albumin: 4.3 g/dL (ref 3.6–5.1)
Alkaline phosphatase (APISO): 56 U/L (ref 37–153)
Bilirubin, Direct: 0.1 mg/dL (ref 0.0–0.2)
Globulin: 2.7 g/dL (calc) (ref 1.9–3.7)
Indirect Bilirubin: 0.4 mg/dL (calc) (ref 0.2–1.2)
Total Bilirubin: 0.5 mg/dL (ref 0.2–1.2)
Total Protein: 7 g/dL (ref 6.1–8.1)

## 2020-07-27 LAB — TSH: TSH: 0.26 mIU/L — ABNORMAL LOW (ref 0.40–4.50)

## 2020-07-30 ENCOUNTER — Other Ambulatory Visit: Payer: Self-pay

## 2020-07-30 ENCOUNTER — Encounter: Payer: Self-pay | Admitting: Internal Medicine

## 2020-07-30 ENCOUNTER — Ambulatory Visit: Payer: BC Managed Care – PPO | Admitting: Internal Medicine

## 2020-07-30 VITALS — BP 110/70 | HR 80 | Temp 98.1°F | Ht 66.0 in | Wt 238.0 lb

## 2020-07-30 DIAGNOSIS — E8881 Metabolic syndrome: Secondary | ICD-10-CM | POA: Diagnosis not present

## 2020-07-30 DIAGNOSIS — E119 Type 2 diabetes mellitus without complications: Secondary | ICD-10-CM

## 2020-07-30 DIAGNOSIS — E782 Mixed hyperlipidemia: Secondary | ICD-10-CM

## 2020-07-30 DIAGNOSIS — F409 Phobic anxiety disorder, unspecified: Secondary | ICD-10-CM

## 2020-07-30 DIAGNOSIS — Z8709 Personal history of other diseases of the respiratory system: Secondary | ICD-10-CM

## 2020-07-30 DIAGNOSIS — F419 Anxiety disorder, unspecified: Secondary | ICD-10-CM

## 2020-07-30 DIAGNOSIS — E039 Hypothyroidism, unspecified: Secondary | ICD-10-CM

## 2020-07-30 DIAGNOSIS — Z6838 Body mass index (BMI) 38.0-38.9, adult: Secondary | ICD-10-CM

## 2020-07-30 DIAGNOSIS — F5105 Insomnia due to other mental disorder: Secondary | ICD-10-CM

## 2020-07-30 MED ORDER — EZETIMIBE 10 MG PO TABS: 10.0000 mg | ORAL_TABLET | Freq: Every day | ORAL | 3 refills | Status: DC

## 2020-07-30 MED ORDER — LEVOTHYROXINE SODIUM 150 MCG PO TABS: 150.0000 ug | ORAL_TABLET | Freq: Every day | ORAL | 0 refills | Status: DC

## 2020-07-30 NOTE — Progress Notes (Signed)
   Subjective:    Patient ID: Mckenzie Hebert, female    DOB: 03-29-1956, 64 y.o.   MRN: [id number]  HPI Patient here today for follow-up on hypothyroidism, impaired glucose tolerance, and hyperlipidemia.  No longer going to [facility].  Think she can continue diet program on her own.  History of impaired glucose tolerance and hemoglobin A1c excellent at 5.9% and was 6.3% in June.  With regard to hyperlipidemia, total cholesterol 223 and LDL cholesterol 147.  Triglycerides are 107 and total cholesterol is 223.  She has been out of Crestor recently and that likely reflects elevated lipid values.  She will get started back on that and will follow up with lipid panel and TSH in about 4 weeks.  She is on levothyroxine 175 mcg daily but TSH is low at 0.26.  Levothyroxine dose will be reduced to 150 mcg daily with follow-up in a few weeks.  Since Crestor made her joints hurt , she quit taking it.  She is agreeable to trying Zetia 10 mg daily.  Current lipid panel off Crestor shows total cholesterol 223 and LDL cholesterol 147.  HDL cholesterol is 54 and triglycerides 107.  Liver functions are normal but has been out of statin medication recently.  High-dose flu vaccine was received on July 12, 2020.  She has had 2 Moderna COVID-19 vaccines.    Review of Systems     Objective:   Physical Exam Weight is 238 pounds and was 281 pounds in June 2021 for total weight loss of 43 pounds.  No thyromegaly.  Chest clear to auscultation.  Cardiac exam regular rate and rhythm.  No lower extremity pitting edema.  Blood pressure 110/70, pulse 80, temperature 98.1 degrees, pulse oximetry 98% BMI 38.41     Assessment & Plan:  I  TSH is low and levothyroxine needs to be reduced from 175 to 150 mcg daily with follow-up in about 6 weeks.  She has been off of Crestor and total cholesterol has gone up from 169 to 223 and from 93 to 147  Triglycerides and HDL are within normal limits.  Trial of  Zetia 10 mg daily and follow-up in 6 months follow-up in June.  Impaired glucose tolerance-hemoglobin A1c excellent at 5.9%.  Foot exam is within normal limits.  History of GE reflux treated with Protonix generic  History of anxiety treated with Xanax up to 1 mg 3 times a day  History of asthma treated with albuterol as needed  BMI 38.41-excellent weight loss efforts.  Keep up the good work.

## 2020-07-30 NOTE — Patient Instructions (Addendum)
Have TSH drawn in 6 weeks on new dose of thyroid replacement.  Take levothyroxine 150 mcg daily.  Take Zetia 10 mg daily and have lipid panel in 6 weeks. Has discontinued Crestor due to joint pain.  It was a pleasure to see you today.  Keep up the good work with weight loss.

## 2020-08-12 ENCOUNTER — Other Ambulatory Visit: Payer: BC Managed Care – PPO | Admitting: Internal Medicine

## 2020-08-13 ENCOUNTER — Ambulatory Visit: Payer: BC Managed Care – PPO | Admitting: Internal Medicine

## 2020-09-02 ENCOUNTER — Other Ambulatory Visit: Payer: Self-pay

## 2020-09-02 ENCOUNTER — Other Ambulatory Visit: Payer: BC Managed Care – PPO | Admitting: Internal Medicine

## 2020-09-02 DIAGNOSIS — E782 Mixed hyperlipidemia: Secondary | ICD-10-CM | POA: Diagnosis not present

## 2020-09-02 DIAGNOSIS — E039 Hypothyroidism, unspecified: Secondary | ICD-10-CM

## 2020-09-03 ENCOUNTER — Telehealth: Payer: Self-pay | Admitting: Internal Medicine

## 2020-09-03 LAB — LIPID PANEL
Cholesterol: 176 mg/dL (ref <200)
HDL: 51 mg/dL (ref >=50)
LDL Cholesterol (Calc): 100 mg/dL (calc) — ABNORMAL HIGH
Non-HDL Cholesterol (Calc): 125 mg/dL (calc) (ref <130)
Total CHOL/HDL Ratio: 3.5 (calc) (ref <5.0)
Triglycerides: 158 mg/dL — ABNORMAL HIGH (ref <150)

## 2020-09-03 LAB — TSH: TSH: 1.53 mIU/L (ref 0.40–4.50)

## 2020-09-03 NOTE — Telephone Encounter (Signed)
Left detailed message.   

## 2020-09-03 NOTE — Telephone Encounter (Signed)
Mckenzie Hebert called back to inquire about her TSH lab results

## 2020-09-09 ENCOUNTER — Other Ambulatory Visit: Payer: BC Managed Care – PPO | Admitting: Internal Medicine

## 2020-09-27 DIAGNOSIS — M170 Bilateral primary osteoarthritis of knee: Secondary | ICD-10-CM | POA: Diagnosis not present

## 2020-09-29 ENCOUNTER — Other Ambulatory Visit: Payer: Self-pay | Admitting: Internal Medicine

## 2020-10-04 ENCOUNTER — Telehealth: Payer: Self-pay | Admitting: Internal Medicine

## 2020-10-04 NOTE — Telephone Encounter (Signed)
Completed and signed Surgery Clearance form for [facility] and faxed back Attn: Glendale Chard [phone], phone [phone]

## 2020-10-24 ENCOUNTER — Other Ambulatory Visit: Payer: Self-pay | Admitting: Internal Medicine

## 2020-10-31 ENCOUNTER — Encounter: Payer: Self-pay | Admitting: Internal Medicine

## 2020-10-31 DIAGNOSIS — H25813 Combined forms of age-related cataract, bilateral: Secondary | ICD-10-CM | POA: Diagnosis not present

## 2020-10-31 DIAGNOSIS — H524 Presbyopia: Secondary | ICD-10-CM | POA: Diagnosis not present

## 2020-10-31 DIAGNOSIS — E119 Type 2 diabetes mellitus without complications: Secondary | ICD-10-CM | POA: Diagnosis not present

## 2020-10-31 DIAGNOSIS — H43813 Vitreous degeneration, bilateral: Secondary | ICD-10-CM | POA: Diagnosis not present

## 2020-10-31 LAB — HM DIABETES EYE EXAM

## 2020-11-04 ENCOUNTER — Telehealth: Payer: Self-pay | Admitting: Internal Medicine

## 2020-11-04 NOTE — Telephone Encounter (Signed)
Mckenzie Hebert [id number]  [name] called to say for the last couple of months she has been having spells of light headed ness and feelings like the air has gone out of her, and about 2 weeks ago she started having some chest pains, would like to come in one afternoon this week for you to check her out.

## 2020-11-04 NOTE — Telephone Encounter (Signed)
scheduled

## 2020-11-04 NOTE — Telephone Encounter (Signed)
OK 

## 2020-11-05 ENCOUNTER — Encounter: Payer: Self-pay | Admitting: Internal Medicine

## 2020-11-05 ENCOUNTER — Ambulatory Visit: Payer: BC Managed Care – PPO | Admitting: Internal Medicine

## 2020-11-05 ENCOUNTER — Other Ambulatory Visit: Payer: Self-pay

## 2020-11-05 VITALS — BP 110/80 | HR 73 | Ht 66.0 in | Wt 246.0 lb

## 2020-11-05 DIAGNOSIS — Z8709 Personal history of other diseases of the respiratory system: Secondary | ICD-10-CM | POA: Diagnosis not present

## 2020-11-05 DIAGNOSIS — E8881 Metabolic syndrome: Secondary | ICD-10-CM | POA: Diagnosis not present

## 2020-11-05 DIAGNOSIS — R002 Palpitations: Secondary | ICD-10-CM

## 2020-11-05 DIAGNOSIS — E782 Mixed hyperlipidemia: Secondary | ICD-10-CM

## 2020-11-05 DIAGNOSIS — F419 Anxiety disorder, unspecified: Secondary | ICD-10-CM

## 2020-11-05 DIAGNOSIS — R55 Syncope and collapse: Secondary | ICD-10-CM | POA: Diagnosis not present

## 2020-11-05 DIAGNOSIS — E119 Type 2 diabetes mellitus without complications: Secondary | ICD-10-CM

## 2020-11-05 NOTE — Progress Notes (Signed)
   Subjective:    Patient ID: Mckenzie Hebert, female    DOB: [birth year], 65 y.o.   MRN: [id number]  Patient is scheduled for left knee arthroplasty by Dr. Maureen Ralphs on April 4th at [place]  HPI 65 year old Female with near syncope episode recently.  She was at work.  She works for [employer].  Goes to work very early in the morning.  Was sitting at her workstation and began to feel as if she might pass out.  Came to and proceeded to continue with her work.  She has a history of asthma, hyperplastic colon polyps, insomnia, anxiety, urticaria, migraine headaches, hypothyroidism, history of Chiari I malformation, GE reflux, obesity and impaired glucose tolerance.  History of palpitations.  Is seen Dr. Buddy Duty, endocrinologist in the past regarding hypothyroidism impaired glucose tolerance-last visit was March 2020.  Is scheduled to have surgery by Dr. Maureen Ralphs on April 4.  She has a history of mixed hyperlipidemia.  Today we drew hemoglobin A1c which was stable at 5.7%, CBC c-Met and TSH.  She is not anemic and electrolytes are normal.  In 2017 also had a near syncopal episode.  Also around that time has some substernal chest pain.  Nuclear stress test was normal October 11, 2015.  Saw Rhonda Barrett in 2018 regarding palpitations.  EKG showed PACs but heart rate was regular on exam at that time.  Social history: She is a widow and resides alone.  Husband died of heart problems.  No children.  She smoked until the age of 83 and then quit.  She works as a Archivist at [employer].  Family history: Parents are deceased.  Mother died at age 66 with history of hypertension, coronary disease, MI status post CABG.  Father died in his 60s with history of lung cancer.  Mother had diabetes and hypertension.  2 sisters.  1 sister with history of diabetes and hypertension.  2 brothers.    Review of Systems see above-has felt dizzy recently over the past couple of  months.  Also complains of some vague chest pain which started some 2 weeks previously.     Objective:   Physical Exam  Blood pressure 120/80 left arm supine standing blood pressure 110/70 pulse is 88 lying and 82 standing.  Skin warm and dry.  No carotid bruits.  Chest clear.  Cardiac exam frequent irregular contractions.  EKG shows PACs and PVCs no lower extremity pitting edema.  Neuro is intact without gross focal deficits.      Assessment & Plan:  Near syncopal episode at work-etiology unclear could be vasovagal or cardiac in nature..  CBC drawn today is normal and c-Met is normal  Scheduled for knee arthroplasty by Dr. Maureen Ralphs on April 4-needs to see Cardiologist as soon as possible.  Hypothyroidism currently on levothyroxine 150 mcg daily and TSH is normal  Irregular cardiac rhythm-EKG shows PACs and PVCs  History of asthma treated with albuterol  GE reflux treated with Protonix  Hyperlipidemia treated with Zetia  Anxiety disorder treated with Xanax  Glucose intolerance-hemoglobin A1c 5.7%  Plan: She will need Cardiac clearance prior to hip surgery.

## 2020-11-06 LAB — HEMOGLOBIN A1C
Hgb A1c MFr Bld: 5.7 % of total Hgb — ABNORMAL HIGH (ref <5.7)
Mean Plasma Glucose: 117 mg/dL
eAG (mmol/L): 6.5 mmol/L

## 2020-11-06 LAB — COMPLETE METABOLIC PANEL WITH GFR
AG Ratio: 1.6 (calc) (ref 1.0–2.5)
ALT: 21 U/L (ref 6–29)
AST: 18 U/L (ref 10–35)
Albumin: 4.4 g/dL (ref 3.6–5.1)
Alkaline phosphatase (APISO): 60 U/L (ref 37–153)
BUN: 18 mg/dL (ref 7–25)
CO2: 27 mmol/L (ref 20–32)
Calcium: 9.5 mg/dL (ref 8.6–10.4)
Chloride: 102 mmol/L (ref 98–110)
Creat: 0.7 mg/dL (ref 0.50–0.99)
GFR, Est African American: 105 mL/min/{1.73_m2} (ref >=60)
GFR, Est Non African American: 91 mL/min/{1.73_m2} (ref >=60)
Globulin: 2.7 g/dL (calc) (ref 1.9–3.7)
Glucose, Bld: 83 mg/dL (ref 65–99)
Potassium: 4.3 mmol/L (ref 3.5–5.3)
Sodium: 138 mmol/L (ref 135–146)
Total Bilirubin: 0.5 mg/dL (ref 0.2–1.2)
Total Protein: 7.1 g/dL (ref 6.1–8.1)

## 2020-11-06 LAB — CBC WITH DIFFERENTIAL/PLATELET
Absolute Monocytes: 502 cells/uL (ref 200–950)
Basophils Absolute: 50 cells/uL (ref 0–200)
Basophils Relative: 0.8 %
Eosinophils Absolute: 180 cells/uL (ref 15–500)
Eosinophils Relative: 2.9 %
HCT: 41.3 % (ref 35.0–45.0)
Hemoglobin: 13.8 g/dL (ref 11.7–15.5)
Lymphs Abs: 1631 cells/uL (ref 850–3900)
MCH: 27.1 pg (ref 27.0–33.0)
MCHC: 33.4 g/dL (ref 32.0–36.0)
MCV: 81 fL (ref 80.0–100.0)
MPV: 10.2 fL (ref 7.5–12.5)
Monocytes Relative: 8.1 %
Neutro Abs: 3838 cells/uL (ref 1500–7800)
Neutrophils Relative %: 61.9 %
Platelets: 289 10*3/uL (ref 140–400)
RBC: 5.1 10*6/uL (ref 3.80–5.10)
RDW: 13 % (ref 11.0–15.0)
Total Lymphocyte: 26.3 %
WBC: 6.2 10*3/uL (ref 3.8–10.8)

## 2020-11-06 LAB — TSH: TSH: 1.45 mIU/L (ref 0.40–4.50)

## 2020-11-10 ENCOUNTER — Encounter: Payer: Self-pay | Admitting: Internal Medicine

## 2020-11-10 NOTE — Patient Instructions (Signed)
Patient is scheduled for orthopedic surgery April 4.  She needs urgent appt with Cardiology due to near syncopal episode and irregular cardiac rhythm.

## 2020-11-11 ENCOUNTER — Encounter: Payer: Self-pay | Admitting: Internal Medicine

## 2020-11-14 ENCOUNTER — Encounter: Payer: Self-pay | Admitting: Internal Medicine

## 2020-11-15 ENCOUNTER — Encounter: Payer: Self-pay | Admitting: Radiology

## 2020-11-15 ENCOUNTER — Encounter: Payer: Self-pay | Admitting: Cardiology

## 2020-11-15 ENCOUNTER — Ambulatory Visit (INDEPENDENT_AMBULATORY_CARE_PROVIDER_SITE_OTHER): Payer: BC Managed Care – PPO

## 2020-11-15 ENCOUNTER — Other Ambulatory Visit: Payer: Self-pay

## 2020-11-15 ENCOUNTER — Ambulatory Visit (INDEPENDENT_AMBULATORY_CARE_PROVIDER_SITE_OTHER): Payer: BC Managed Care – PPO | Admitting: Cardiology

## 2020-11-15 VITALS — BP 120/72 | HR 65 | Ht 67.0 in | Wt 243.4 lb

## 2020-11-15 DIAGNOSIS — R55 Syncope and collapse: Secondary | ICD-10-CM

## 2020-11-15 DIAGNOSIS — Z01810 Encounter for preprocedural cardiovascular examination: Secondary | ICD-10-CM | POA: Diagnosis not present

## 2020-11-15 DIAGNOSIS — E785 Hyperlipidemia, unspecified: Secondary | ICD-10-CM

## 2020-11-15 NOTE — Patient Instructions (Signed)
Medication Instructions:  Your physician recommends that you continue on your current medications as directed. Please refer to the Current Medication list given to you today.  Testing/Procedures: Your physician has requested that you have an echocardiogram ASAP (surgery 4/4). Echocardiography is a painless test that uses sound waves to create images of your heart. It provides your doctor with information about the size and shape of your heart and how well your heart's chambers and valves are working. This procedure takes approximately one hour. There are no restrictions for this procedure.  This will be done at our [facility] location:  [facility] has requested you wear your ZIO patch monitor 14 days.   This is a single patch monitor.  [employer] supplies one patch monitor per enrollment.  Additional stickers are not available.   Please do not apply patch if you will be having a Nuclear Stress Test, Echocardiogram, Cardiac CT, MRI, or Chest Xray during the time frame you would be wearing the monitor. The patch cannot be worn during these tests.  You cannot remove and re-apply the ZIO XT patch monitor.   Your ZIO patch monitor will be sent USPS Priority mail from [facility] directly to your home address. The monitor may also be mailed to a PO BOX if home delivery is not available.   It may take 3-5 days to receive your monitor after you have been enrolled.   Once you have received you monitor, please review enclosed instructions.  Your monitor has already been registered assigning a specific monitor serial # to you.   Applying the monitor   Shave hair from upper left chest.   Hold abrader disc by orange tab.  Rub abrader in 40 strokes over left upper chest as indicated in your monitor instructions.   Clean area with 4 enclosed alcohol pads .  Use all pads to assure are is cleaned thoroughly.  Let dry.    Apply patch as indicated in monitor instructions.  Patch will be place under collarbone on left side of chest with arrow pointing upward.   Rub patch adhesive wings for 2 minutes.Remove white label marked "1".  Remove white label marked "2".  Rub patch adhesive wings for 2 additional minutes.   While looking in a mirror, press and release button in center of patch.  A small green light will flash 3-4 times .  This will be your only indicator the monitor has been turned on.     Do not shower for the first 24 hours.  You may shower after the first 24 hours.   Press button if you feel a symptom. You will hear a small click.  Record Date, Time and Symptom in the Patient Log Book.   When you are ready to remove patch, follow instructions on last 2 pages of Patient Log Book.  Stick patch monitor onto last page of Patient Log Book.   Place Patient Log Book in [place] box.  Use locking tab on box and tape box closed securely.  The [employer] has [employer] on it.  Please place in mailbox as soon as possible.  Your physician should have your test results approximately 7 days after the monitor has been mailed back to [facility].   Call [facility] at [phone] if you have questions regarding your ZIO XT patch monitor.  Call them immediately if you see an  orange light blinking on your monitor.   If your monitor falls off in less than 4 days contact our Monitor department at [phone].  If your monitor becomes loose or falls off after 4 days call [employer] at [phone] for suggestions on securing your monitor.    Follow-Up: At [facility], you and your health needs are our priority.  As part of our continuing mission to provide you with exceptional heart care, we have created designated Provider Care Teams.  These Care Teams include your primary Cardiologist (physician) and Advanced Practice Providers (APPs -  Physician Assistants and Nurse Practitioners) who  all work together to provide you with the care you need, when you need it.  We recommend signing up for the patient portal called "MyChart".  Sign up information is provided on this After Visit Summary.  MyChart is used to connect with patients for Virtual Visits (Telemedicine).  Patients are able to view lab/test results, encounter notes, upcoming appointments, etc.  Non-urgent messages can be sent to your provider as well.   To learn more about what you can do with MyChart, go to [email or web address].    Your next appointment:   3 month(s)  The format for your next appointment:   In Person  Provider:   Oswaldo Milian, MD

## 2020-11-15 NOTE — Progress Notes (Signed)
Enrolled patient for a 14 Zio XT Monitor to be mailed to patients home.  

## 2020-11-15 NOTE — Progress Notes (Signed)
Cardiology Office Note:    Date:  11/17/2020   ID:  Mckenzie Hebert, DOB May 23, 1956, MRN [id number]  PCP:  Elby Showers, MD  Cardiologist:  No primary care provider on file.  Electrophysiologist:  None   Referring MD: Elby Showers, MD   Chief Complaint  Patient presents with  . Near Syncope    History of Present Illness:    Mckenzie Hebert is a 65 y.o. female with a hx of hypothyroidism, prediabetes who is referred by Dr. Renold Genta for evaluation of near syncopal episode.  She is also planning knee surgery in April.  She works as a Sports coach and reports that on 10/10/2020 she was sitting down and felt lightheaded.  She denies any chest pain, dyspnea, palpitations.  States that she felt she was going to pass out, but turned her fan on and started taking deep breaths and symptoms improved.  She reports she lost 50 pounds from June to November last year.  Reports occasional chest pain, which she states occurs a couple times per year and describes as dull aching pain in the center of her chest that can last up to 30 minutes.  She denies any exertional chest pain.  States that she can walk up a flight of stairs without stopping, denies any chest pain or shortness of breath with this.  She smoked in her teens and 48s.  Family history includes mother had CABG and CHF, died during a nuclear stress test.  Lexiscan Myoview on 10/11/2015 showed normal perfusion, EF 64%.     Past Medical History:  Diagnosis Date  . AC (acromioclavicular) joint bone spurs 2008   right foot  . Anxiety   . Asthma   . Back pain   . Back pain   . Bilateral swelling of feet   . Constipation   . Endometrial polyp   . Endometrial polyp   . Heartburn   . History of colon polyps   . Hypothyroidism   . Joint pain   . Joint pain   . Migraines    Dr Catalina Gravel  . Osteoarthritis   . Palpitations    cardiologist-  dr hochrein-- normal myoview 2017, holter 2015 mild ectopy  . PONV (postoperative nausea and  vomiting)   . Pre-diabetes 2019  . SOB (shortness of breath)   . Thyroid disease   . Uterine fibroid   . Vitamin D deficiency     Past Surgical History:  Procedure Laterality Date  . APPENDECTOMY    . CARDIOVASCULAR STRESS TEST  10-11-2015   dr hochrein   normal nuclear study w/ no ischemia/  normal LV function and wall motion, nuclear stress ef 64%  . D & C HYSTERSCOPY W/ RESECTION POLYP  01-16-2010   dr gottsegen  @ [facility]  . DILATATION & CURETTAGE/HYSTEROSCOPY WITH MYOSURE N/A 06/07/2018   Procedure: DILATATION & CURETTAGE/HYSTEROSCOPY WITH MYOSURE;  Surgeon: Anastasio Auerbach, MD;  Location: [facility];  Service: Gynecology;  Laterality: N/A;  request to follow first case in [place] block time  requests one hour  . FOOT SURGERY  2002, 2008   bi-lat , bone spurs, achilles tendon work  . HAMMER TOE SURGERY  11/2018  . RESECTION TUMOR CLAVICLE RADICAL  1985   benign  . TONSILLECTOMY      Current Medications: Current Meds  Medication Sig  . albuterol (VENTOLIN HFA) 108 (90 Base) MCG/ACT inhaler Inhale 2 puffs into the lungs every 6 (six) hours as needed for  wheezing or shortness of breath.  . ALPRAZolam (XANAX) 1 MG tablet TAKE 1 TABLET(1 MG) BY MOUTH THREE TIMES DAILY  . blood glucose meter kit and supplies KIT by Does not apply route daily as needed. Check glucose daily before breakfast and supper.  . Cholecalciferol (VITAMIN D3 PO) Take 4,000 Int'l Units by mouth as needed.  . clobetasol ointment (TEMOVATE) 8.18 % Apply 1 application topically 2 (two) times daily. (Patient taking differently: Apply 1 application topically as needed.)  . clotrimazole-betamethasone (LOTRISONE) cream Apply 1 application topically 2 (two) times daily. (Patient taking differently: Apply 1 application topically as needed.)  . ezetimibe (ZETIA) 10 MG tablet Take 1 tablet (10 mg total) by mouth daily.  . fluocinonide-emollient (LIDEX-E) 0.05 % cream Apply 1 application topically 2  (two) times daily.  Marland Kitchen levothyroxine (SYNTHROID) 150 MCG tablet TAKE 1 TABLET(150 MCG) BY MOUTH DAILY  . Multiple Vitamins-Minerals (CENTRUM SILVER ADULT 50+ PO) Take by mouth.  . pantoprazole (PROTONIX) 40 MG tablet TAKE 1 TABLET BY MOUTH DAILY  . senna (SENOKOT) 8.6 MG tablet Take 1 tablet by mouth daily. 1-4 per day as needed     Allergies:   Patient has no known allergies.   Social History   Socioeconomic History  . Marital status: Single    Spouse name: Not on file  . Number of children: Not on file  . Years of education: Not on file  . Highest education level: Not on file  Occupational History  . Occupation: Histology    Employer: [employer]  Tobacco Use  . Smoking status: Former Smoker    Quit date: 1973    Years since quitting: 49.2  . Smokeless tobacco: Never Used  Vaping Use  . Vaping Use: Never used  Substance and Sexual Activity  . Alcohol use: No    Alcohol/week: 0.0 standard drinks  . Drug use: No  . Sexual activity: Not Currently    Comment: INTERCOURSE AGE 6, SEXUAL PARTNERS LESS THAN 5  Other Topics Concern  . Not on file  Social History Narrative   Lives alone.     Social Determinants of Health   Financial Resource Strain: Not on file  Food Insecurity: Not on file  Transportation Needs: Not on file  Physical Activity: Not on file  Stress: Not on file  Social Connections: Not on file     Family History: The patient'sfamily history includes Alcohol abuse in her father; Autoimmune disease in her sister; Breast cancer in her paternal aunt; Cancer in her father; Depression in her father; Diabetes in her mother and sister; Heart disease (age of onset: 75) in her mother; Hyperlipidemia in her mother; Hypertension in her mother, sister, and sister; Leukemia in her paternal grandmother; Mental retardation in an other family member; Stroke in her mother; Thyroid disease in her sister.  ROS:   Please see the history of present illness.    All  other systems reviewed and are negative.  EKGs/Labs/Other Studies Reviewed:    The following studies were reviewed today:   EKG:  EKG is  ordered today.  The ekg ordered today demonstrates normal sinus rhythm, PVCs, low voltage  Recent Labs: 11/05/2020: ALT 21; BUN 18; Creat 0.70; Hemoglobin 13.8; Platelets 289; Potassium 4.3; Sodium 138; TSH 1.45  Recent Lipid Panel    Component Value Date/Time   CHOL 176 09/02/2020 0950   TRIG 158 (H) 09/02/2020 0950   HDL 51 09/02/2020 0950   CHOLHDL 3.5 09/02/2020 0950   VLDL 17 05/12/2016  0933   LDLCALC 100 (H) 09/02/2020 0950    Physical Exam:    VS:  BP 120/72   Pulse 65   Ht '5\' 7"'  (1.702 m)   Wt 243 lb 6.4 oz (110.4 kg)   LMP 03/18/2008 (LMP Unknown)   BMI 38.12 kg/m     Wt Readings from Last 3 Encounters:  11/15/20 243 lb 6.4 oz (110.4 kg)  11/05/20 246 lb (111.6 kg)  07/30/20 238 lb (108 kg)     GEN: Well nourished, well developed in no acute distress HEENT: Normal NECK: No JVD; No carotid bruits LYMPHATICS: No lymphadenopathy CARDIAC: RRR, no murmurs, rubs, gallops RESPIRATORY:  Clear to auscultation without rales, wheezing or rhonchi  ABDOMEN: Soft, non-tender, non-distended MUSCULOSKELETAL:  No edema; No deformity  SKIN: Warm and dry NEUROLOGIC:  Alert and oriented x 3 PSYCHIATRIC:  Normal affect   ASSESSMENT:    1. Near syncope   2. Pre-operative cardiovascular examination   3. Hyperlipidemia, unspecified hyperlipidemia type    PLAN:    Near syncope: Unclear cause.  Will check echocardiogram to rule out structural heart disease.  Zio patch x14 days to evaluate for arrhythmia  Preop evaluation: Prior to knee surgery.  Functional capacity appears greater than 4 METS, can walk up a flight of stairs without chest pain or shortness of breath.  RCRI score 0.  Checking echocardiogram given recent near syncopal episode as above.  If unremarkable, no further work-up recommended prior to surgery.  Hyperlipidemia: On  Zetia 10 mg daily.  LDL 100 on 09/02/2020  RTC in 3 months   Medication Adjustments/Labs and Tests Ordered: Current medicines are reviewed at length with the patient today.  Concerns regarding medicines are outlined above.  Orders Placed This Encounter  Procedures  . LONG TERM MONITOR (3-14 DAYS)  . EKG 12-Lead  . ECHOCARDIOGRAM COMPLETE   No orders of the defined types were placed in this encounter.   Patient Instructions  Medication Instructions:  Your physician recommends that you continue on your current medications as directed. Please refer to the Current Medication list given to you today.  Testing/Procedures: Your physician has requested that you have an echocardiogram ASAP (surgery 4/4). Echocardiography is a painless test that uses sound waves to create images of your heart. It provides your doctor with information about the size and shape of your heart and how well your heart's chambers and valves are working. This procedure takes approximately one hour. There are no restrictions for this procedure.  This will be done at our [facility] location:  [place] has requested you wear your ZIO patch monitor 14 days.   This is a single patch monitor.  [employer] supplies one patch monitor per enrollment.  Additional stickers are not available.   Please do not apply patch if you will be having a Nuclear Stress Test, Echocardiogram, Cardiac CT, MRI, or Chest Xray during the time frame you would be wearing the monitor. The patch cannot be worn during these tests.  You cannot remove and re-apply the ZIO XT patch monitor.   Your ZIO patch monitor will be sent USPS Priority mail from [facility] directly to your home address. The monitor may also be mailed to a PO BOX if home delivery is not available.   It may take 3-5 days to receive your monitor after you have been enrolled.   Once you have  received you  monitor, please review enclosed instructions.  Your monitor has already been registered assigning a specific monitor serial # to you.   Applying the monitor   Shave hair from upper left chest.   Hold abrader disc by orange tab.  Rub abrader in 40 strokes over left upper chest as indicated in your monitor instructions.   Clean area with 4 enclosed alcohol pads .  Use all pads to assure are is cleaned thoroughly.  Let dry.   Apply patch as indicated in monitor instructions.  Patch will be place under collarbone on left side of chest with arrow pointing upward.   Rub patch adhesive wings for 2 minutes.Remove white label marked "1".  Remove white label marked "2".  Rub patch adhesive wings for 2 additional minutes.   While looking in a mirror, press and release button in center of patch.  A small green light will flash 3-4 times .  This will be your only indicator the monitor has been turned on.     Do not shower for the first 24 hours.  You may shower after the first 24 hours.   Press button if you feel a symptom. You will hear a small click.  Record Date, Time and Symptom in the Patient Log Book.   When you are ready to remove patch, follow instructions on last 2 pages of Patient Log Book.  Stick patch monitor onto last page of Patient Log Book.   Place Patient Log Book in [place] box.  Use locking tab on box and tape box closed securely.  The [employer] has [employer] on it.  Please place in mailbox as soon as possible.  Your physician should have your test results approximately 7 days after the monitor has been mailed back to [facility].   Call [place] at [phone] if you have questions regarding your ZIO XT patch monitor.  Call them immediately if you see an orange light blinking on your monitor.   If your monitor falls off in less than 4 days contact our Monitor department at [phone].  If your monitor becomes loose or falls off  after 4 days call [employer] at [phone] for suggestions on securing your monitor.    Follow-Up: At [facility], you and your health needs are our priority.  As part of our continuing mission to provide you with exceptional heart care, we have created designated Provider Care Teams.  These Care Teams include your primary Cardiologist (physician) and Advanced Practice Providers (APPs -  Physician Assistants and Nurse Practitioners) who all work together to provide you with the care you need, when you need it.  We recommend signing up for the patient portal called "MyChart".  Sign up information is provided on this After Visit Summary.  MyChart is used to connect with patients for Virtual Visits (Telemedicine).  Patients are able to view lab/test results, encounter notes, upcoming appointments, etc.  Non-urgent messages can be sent to your provider as well.   To learn more about what you can do with MyChart, go to [email or web address].    Your next appointment:   3 month(s)  The format for your next appointment:   In Person  Provider:   Oswaldo Milian, MD         Signed, Donato Heinz, MD  11/17/2020 2:23 PM    [place]

## 2020-11-18 ENCOUNTER — Telehealth: Payer: Self-pay

## 2020-11-18 NOTE — Telephone Encounter (Signed)
Patient came into ofc to let Dr Gardiner Rhyme know she would not have her monitor in time to get a full 2 weeks b/4 Echo on 11-29-20.  Per [facility], she is ok NOT to wear it until after her surgery on the 4th.  VB 11-18-2020

## 2020-11-29 ENCOUNTER — Ambulatory Visit (HOSPITAL_COMMUNITY): Payer: BC Managed Care – PPO | Attending: Cardiology

## 2020-11-29 ENCOUNTER — Other Ambulatory Visit: Payer: Self-pay

## 2020-11-29 DIAGNOSIS — R55 Syncope and collapse: Secondary | ICD-10-CM | POA: Insufficient documentation

## 2020-11-29 DIAGNOSIS — Z01810 Encounter for preprocedural cardiovascular examination: Secondary | ICD-10-CM | POA: Diagnosis not present

## 2020-11-29 LAB — ECHOCARDIOGRAM COMPLETE
Area-P 1/2: 2.62 cm2
S' Lateral: 2.6 cm

## 2020-11-29 NOTE — Patient Instructions (Addendum)
DUE TO COVID-19 ONLY ONE VISITOR IS ALLOWED TO COME WITH YOU AND STAY IN THE WAITING ROOM ONLY DURING PRE OP AND PROCEDURE DAY OF SURGERY. THE 1 VISITOR  MAY VISIT WITH YOU AFTER SURGERY IN YOUR PRIVATE ROOM DURING VISITING HOURS ONLY!  YOU NEED TO HAVE A COVID 19 TEST ON__4/1_____ @_2 :30 pm______, THIS TEST MUST BE DONE BEFORE SURGERY,  COVID TESTING SITE [place] [id number], IT IS ON THE RIGHT GOING OUT [address] APPROXIMATELY  2 MINUTES PAST [employer] ON THE RIGHT. ONCE YOUR COVID TEST IS COMPLETED,  PLEASE BEGIN THE QUARANTINE INSTRUCTIONS AS OUTLINED IN YOUR HANDOUT.                Mckenzie Hebert   Your procedure is scheduled on: 12/09/20   Report to [facility] Main  Entrance   Report to admitting at   6:55 AM     Call this number if you have problems the morning of surgery [place], NO CHEWING GUM [place].  No food after midnight.    You may have clear liquid until 6:00 AM.      Nothing by mouth after 6:00 AM.    Take these medicines the morning of surgery with A SIP OF WATER: Levothyroxine, Pantoprazole, Xanax if needed                                                                                                                                 Use your inhaler and bring it with you to the hospital                                 You may not have any metal on your body including hair pins and              piercings  Do not wear jewelry, make-up, lotions, powders or perfumes, deodorant             Do not wear nail polish on your fingernails.  Do not shave  48 hours prior to surgery.     Do not bring valuables to the hospital. [place].  Contacts, dentures or bridgework may not be worn into surgery.       Special Instructions: N/A              Please read over the following fact sheets you  were given: _____________________________________________________________________             [facility] - Preparing for Surgery Before surgery, you can play an important role.  Because skin is not sterile, your skin needs to be as free of germs as  possible.  You can reduce the number of germs on your skin by washing with CHG (chlorahexidine gluconate) soap before surgery.  CHG is an antiseptic cleaner which kills germs and bonds with the skin to continue killing germs even after washing. Please DO NOT use if you have an allergy to CHG or antibacterial soaps.  If your skin becomes reddened/irritated stop using the CHG and inform your nurse when you arrive at Short Stay. Do not shave (including legs and underarms) for at least 48 hours prior to the first CHG shower.   Please follow these instructions carefully:  1.  Shower with CHG Soap the night before surgery and the  morning of Surgery.  2.  If you choose to wash your hair, wash your hair first as usual with your  normal  shampoo.  3.  After you shampoo, rinse your hair and body thoroughly to remove the  shampoo.                                        4.  Use CHG as you would any other liquid soap.  You can apply chg directly  to the skin and wash                       Gently with a scrungie or clean washcloth.  5.  Apply the CHG Soap to your body ONLY FROM THE NECK DOWN.   Do not use on face/ open                           Wound or open sores. Avoid contact with eyes, ears mouth and genitals (private parts).                       Wash face,  Genitals (private parts) with your normal soap.             6.  Wash thoroughly, paying special attention to the area where your surgery  will be performed.  7.  Thoroughly rinse your body with warm water from the neck down.  8.  DO NOT shower/wash with your normal soap after using and rinsing off  the CHG Soap.             9.  Pat yourself dry with a clean towel.            10.  Wear clean pajamas.             11.  Place clean sheets on your bed the night of your first shower and do not  sleep with pets. Day of Surgery : Do not apply any lotions/deodorants the morning of surgery.  Please wear clean clothes to the hospital/surgery center.  FAILURE TO FOLLOW THESE INSTRUCTIONS MAY RESULT IN THE CANCELLATION OF YOUR SURGERY PATIENT SIGNATURE_________________________________  NURSE SIGNATURE__________________________________  ________________________________________________________________________   Mckenzie Hebert  An incentive spirometer is a tool that can help keep your lungs clear and active. This tool measures how well you are filling your lungs with each breath. Taking long deep breaths may help reverse or decrease the chance of developing breathing (pulmonary) problems (especially infection) following:  A long period of time when you are unable to move or be active. BEFORE THE PROCEDURE   If the spirometer includes an indicator to show your best  effort, your nurse or respiratory therapist will set it to a desired goal.  If possible, sit up straight or lean slightly forward. Try not to slouch.  Hold the incentive spirometer in an upright position. INSTRUCTIONS FOR USE  1. Sit on the edge of your bed if possible, or sit up as far as you can in bed or on a chair. 2. Hold the incentive spirometer in an upright position. 3. Breathe out normally. 4. Place the mouthpiece in your mouth and seal your lips tightly around it. 5. Breathe in slowly and as deeply as possible, raising the piston or the ball toward the top of the column. 6. Hold your breath for 3-5 seconds or for as long as possible. Allow the piston or ball to fall to the bottom of the column. 7. Remove the mouthpiece from your mouth and breathe out normally. 8. Rest for a few seconds and repeat Steps 1 through 7 at least 10 times every 1-2 hours when you are awake. Take your time and take a few normal breaths between  deep breaths. 9. The spirometer may include an indicator to show your best effort. Use the indicator as a goal to work toward during each repetition. 10. After each set of 10 deep breaths, practice coughing to be sure your lungs are clear. If you have an incision (the cut made at the time of surgery), support your incision when coughing by placing a pillow or rolled up towels firmly against it. Once you are able to get out of bed, walk around indoors and cough well. You may stop using the incentive spirometer when instructed by your caregiver.  RISKS AND COMPLICATIONS  Take your time so you do not get dizzy or light-headed.  If you are in pain, you may need to take or ask for pain medication before doing incentive spirometry. It is harder to take a deep breath if you are having pain. AFTER USE  Rest and breathe slowly and easily.  It can be helpful to keep track of a log of your progress. Your caregiver can provide you with a simple table to help with this. If you are using the spirometer at home, follow these instructions: [place] IF:   You are having difficultly using the spirometer.  You have trouble using the spirometer as often as instructed.  Your pain medication is not giving enough relief while using the spirometer.  You develop fever of 100.5 F (38.1 C) or higher. SEEK IMMEDIATE MEDICAL CARE IF:   You cough up bloody sputum that had not been present before.  You develop fever of 102 F (38.9 C) or greater.  You develop worsening pain at or near the incision site. MAKE SURE YOU:   Understand these instructions.  Will watch your condition.  Will get help right away if you are not doing well or get worse. Document Released: 01/04/2007 Document Revised: 11/16/2011 Document Reviewed: 03/07/2007 [facility] Patient Information 2014 [place], [place].   ________________________________________________________________________

## 2020-12-02 ENCOUNTER — Other Ambulatory Visit: Payer: Self-pay

## 2020-12-02 ENCOUNTER — Encounter (HOSPITAL_COMMUNITY)
Admission: RE | Admit: 2020-12-02 | Discharge: 2020-12-02 | Disposition: A | Discharge status: Home / Self Care | Payer: BC Managed Care – PPO | Source: Ambulatory Visit | Attending: Orthopedic Surgery | Admitting: Orthopedic Surgery

## 2020-12-02 ENCOUNTER — Encounter (HOSPITAL_COMMUNITY): Payer: Self-pay

## 2020-12-02 DIAGNOSIS — Z01818 Encounter for other preprocedural examination: Secondary | ICD-10-CM | POA: Diagnosis not present

## 2020-12-02 DIAGNOSIS — I4510 Unspecified right bundle-branch block: Secondary | ICD-10-CM | POA: Diagnosis not present

## 2020-12-02 HISTORY — DX: Dyspnea, unspecified: R06.00

## 2020-12-02 LAB — CBC
HCT: 39.7 % (ref 36.0–46.0)
Hemoglobin: 13.2 g/dL (ref 12.0–15.0)
MCH: 27.5 pg (ref 26.0–34.0)
MCHC: 33.2 g/dL (ref 30.0–36.0)
MCV: 82.7 fL (ref 80.0–100.0)
Platelets: 238 10*3/uL (ref 150–400)
RBC: 4.8 MIL/uL (ref 3.87–5.11)
RDW: 13.4 % (ref 11.5–15.5)
WBC: 5.6 10*3/uL (ref 4.0–10.5)
nRBC: 0 % (ref 0.0–0.2)

## 2020-12-02 LAB — COMPREHENSIVE METABOLIC PANEL WITH GFR
ALT: 26 U/L (ref 0–44)
AST: 23 U/L (ref 15–41)
Albumin: 4.1 g/dL (ref 3.5–5.0)
Alkaline Phosphatase: 55 U/L (ref 38–126)
Anion gap: 7 (ref 5–15)
BUN: 18 mg/dL (ref 8–23)
CO2: 27 mmol/L (ref 22–32)
Calcium: 9.3 mg/dL (ref 8.9–10.3)
Chloride: 103 mmol/L (ref 98–111)
Creatinine, Ser: 0.68 mg/dL (ref 0.44–1.00)
GFR, Estimated: >60 mL/min (ref >60)
Glucose, Bld: 144 mg/dL — ABNORMAL HIGH (ref 70–99)
Potassium: 3.9 mmol/L (ref 3.5–5.1)
Sodium: 137 mmol/L (ref 135–145)
Total Bilirubin: 0.7 mg/dL (ref 0.3–1.2)
Total Protein: 7 g/dL (ref 6.5–8.1)

## 2020-12-02 LAB — PROTIME-INR
INR: 1.1 (ref 0.8–1.2)
Prothrombin Time: 13.3 seconds (ref 11.4–15.2)

## 2020-12-02 LAB — SURGICAL PCR SCREEN
MRSA, PCR: NEGATIVE
Staphylococcus aureus: NEGATIVE

## 2020-12-02 LAB — APTT: aPTT: 30 seconds (ref 24–36)

## 2020-12-02 NOTE — Progress Notes (Signed)
COVID Vaccine Completed:Yes Date COVID Vaccine completed:07/12/20 COVID vaccine manufacturer:  Moderna    PCP - Dr. Ezzard Flax Cardiologist - Dr. Percival Spanish  Chest x-ray - no EKG - 12/02/20-chart Stress Test - 2017 ECHO - 11/29/20-epic Cardiac Cath - no Pacemaker/ICD device last checked:NA  Sleep Study - yes- negative findings CPAP - no  Fasting Blood Sugar - NA Checks Blood Sugar _____ times a day  Blood Thinner Instructions:NA Aspirin Instructions: Last Dose:  Anesthesia review:   Patient denies shortness of breath, fever, cough and chest pain at PAT appointment yes  Patient verbalized understanding of instructions that were given to them at the PAT appointment. Patient was also instructed that they will need to review over the PAT instructions again at home before surgery.yes Pt gets SOB walking long distances but not with housework or with ADLs.

## 2020-12-04 NOTE — H&P (Signed)
TOTAL KNEE ADMISSION H&P  Patient is being admitted for left total knee arthroplasty.  Subjective:  Chief Complaint: Left knee pain.  HPI: Mckenzie Hebert, 65 y.o. female has a history of pain and functional disability in the left knee due to arthritis and has failed non-surgical conservative treatments for greater than 12 weeks to include corticosteriod injections and activity modification. Onset of symptoms was gradual, starting several years ago with gradually worsening course since that time. The patient noted no past surgery on the left knee.  Patient currently rates pain in the left knee at 8 out of 10 with activity. Patient has night pain, worsening of pain with activity and weight bearing and pain with passive range of motion. Patient has evidence of periarticular osteophytes and joint space narrowing by imaging studies. There is no active infection.  Patient Active Problem List   Diagnosis Date Noted  . Osteoarthritis of left knee 08/16/2018  . Pain in left knee 07/27/2018  . BMI 40.0-44.9, adult ([place]) 01/14/2018  . Trochanteric bursitis of right hip 11/03/2017  . Chest pain 10/07/2015  . Hypothyroidism 05/14/2015  . Palpitation 07/17/2014  . Controlled diabetes mellitus type II without complication ([place]) 16/06/9603  . Obesity, unspecified 11/05/2013  . Hx of migraine headaches 12/07/2011  . Urticaria 04/04/2011  . Asthma 04/04/2011  . Eczema 04/04/2011  . Anxiety 04/04/2011  . Vitamin D deficiency 04/04/2011  . Hyperplastic colon polyp 04/04/2011  . GE reflux 04/04/2011  . Insomnia 04/04/2011    Past Medical History:  Diagnosis Date  . AC (acromioclavicular) joint bone spurs 2008   right foot  . Anxiety   . Asthma   . Back pain   . Back pain   . Bilateral swelling of feet   . Constipation   . Dyspnea   . Dysrhythmia 2019   PVC  . Endometrial polyp   . Endometrial polyp   . Heartburn   . History of colon polyps   . Hypothyroidism   . Joint pain   .  Joint pain   . Migraines    Dr Catalina Gravel  . Osteoarthritis   . Palpitations    cardiologist-  dr hochrein-- normal myoview 2017, holter 2015 mild ectopy  . PONV (postoperative nausea and vomiting)   . Pre-diabetes 2019  . SOB (shortness of breath)   . Thyroid disease   . Uterine fibroid   . Vitamin D deficiency     Past Surgical History:  Procedure Laterality Date  . APPENDECTOMY    . CARDIOVASCULAR STRESS TEST  10-11-2015   dr hochrein   normal nuclear study w/ no ischemia/  normal LV function and wall motion, nuclear stress ef 64%  . D & C HYSTERSCOPY W/ RESECTION POLYP  01-16-2010   dr gottsegen  @ [facility]  . DILATATION & CURETTAGE/HYSTEROSCOPY WITH MYOSURE N/A 06/07/2018   Procedure: DILATATION & CURETTAGE/HYSTEROSCOPY WITH MYOSURE;  Surgeon: Anastasio Auerbach, MD;  Location: [place];  Service: Gynecology;  Laterality: N/A;  request to follow first case in [place] block time  requests one hour  . FOOT SURGERY  2002, 2008   bi-lat , bone spurs, achilles tendon work  . HAMMER TOE SURGERY  11/2018  . RESECTION TUMOR CLAVICLE RADICAL  1985   benign  . TONSILLECTOMY      Prior to Admission medications   Medication Sig Start Date End Date Taking? Authorizing Provider  albuterol (VENTOLIN HFA) 108 (90 Base) MCG/ACT inhaler Inhale 2 puffs into the lungs  every 6 (six) hours as needed for wheezing or shortness of breath. 05/14/20  Yes Baxley, Cresenciano Lick, MD  ALPRAZolam [name]) 1 MG tablet TAKE 1 TABLET(1 MG) BY MOUTH THREE TIMES DAILY Patient taking differently: Take 0.5-1 mg by mouth 3 (three) times daily as needed for anxiety. 09/29/20  Yes Baxley, Cresenciano Lick, MD  Cholecalciferol (SM VITAMIN D3) 100 MCG (4000 UT) CAPS Take 4,000 Units by mouth daily.   Yes [provider]  clobetasol ointment (TEMOVATE) 4.17 % Apply 1 application topically 2 (two) times daily. Patient taking differently: Apply 1 application topically daily as needed (rash). 09/30/17  Yes Baxley,  Cresenciano Lick, MD  ezetimibe (ZETIA) 10 MG tablet Take 1 tablet (10 mg total) by mouth daily. 07/30/20  Yes Baxley, Cresenciano Lick, MD  levothyroxine (SYNTHROID) 150 MCG tablet TAKE 1 TABLET(150 MCG) BY MOUTH DAILY Patient taking differently: Take 150 mcg by mouth daily before breakfast. 10/24/20  Yes Baxley, Cresenciano Lick, MD  pantoprazole (PROTONIX) 40 MG tablet TAKE 1 TABLET BY MOUTH DAILY Patient taking differently: Take 40 mg by mouth at bedtime. 07/21/20  Yes Baxley, Cresenciano Lick, MD  Polyethyl Glycol-Propyl Glycol (SYSTANE) 0.4-0.3 % GEL ophthalmic gel Place 1 application into both eyes at bedtime as needed (dry eyes).   Yes [provider]  senna (SENOKOT) 8.6 MG tablet Take 2 tablets by mouth at bedtime.   Yes [provider]  blood glucose meter kit and supplies KIT by Does not apply route daily as needed. Check glucose daily before breakfast and supper.    [provider]  clotrimazole-betamethasone (LOTRISONE) cream Apply 1 application topically 2 (two) times daily. Patient not taking: No sig reported 09/30/17   Elby Showers, MD  fluocinonide-emollient (LIDEX-E) 0.05 % cream Apply 1 application topically 2 (two) times daily. Patient not taking: No sig reported 06/26/19   Elby Showers, MD    No Known Allergies  Social History   Socioeconomic History  . Marital status: Widowed    Spouse name: Not on file  . Number of children: Not on file  . Years of education: Not on file  . Highest education level: Not on file  Occupational History  . Occupation: Histology    Employer: [employer]  Tobacco Use  . Smoking status: Former Smoker    Quit date: 1973    Years since quitting: 49.2  . Smokeless tobacco: Never Used  Vaping Use  . Vaping Use: Never used  Substance and Sexual Activity  . Alcohol use: No    Alcohol/week: 0.0 standard drinks  . Drug use: No  . Sexual activity: Not Currently    Comment: INTERCOURSE AGE 30, SEXUAL PARTNERS LESS THAN 5  Other Topics  Concern  . Not on file  Social History Narrative   Lives alone.     Social Determinants of Health   Financial Resource Strain: Not on file  Food Insecurity: Not on file  Transportation Needs: Not on file  Physical Activity: Not on file  Stress: Not on file  Social Connections: Not on file  Intimate Partner Violence: Not on file    Tobacco Use: Medium Risk  . Smoking Tobacco Use: Former Smoker  . Smokeless Tobacco Use: Never Used   Social History   Substance and Sexual Activity  Alcohol Use No  . Alcohol/week: 0.0 standard drinks    Family History  Problem Relation Age of Onset  . Diabetes Mother   . Hypertension Mother   . Heart disease Mother 25  CABG  . Stroke Mother   . Hyperlipidemia Mother   . Cancer Father        lung cancer  . Depression Father   . Alcohol abuse Father   . Diabetes Sister   . Hypertension Sister   . Thyroid disease Sister        hyperthyroidism  . Breast cancer Paternal Aunt   . Hypertension Sister   . Autoimmune disease Sister   . Leukemia Paternal Grandmother   . Mental retardation Other     Review of Systems  Constitutional: Negative for chills and fever.  HENT: Negative for congestion, sore throat and tinnitus.   Eyes: Negative for double vision, photophobia and pain.  Respiratory: Negative for cough, shortness of breath and wheezing.   Cardiovascular: Negative for chest pain, palpitations and orthopnea.  Gastrointestinal: Negative for heartburn, nausea and vomiting.  Genitourinary: Negative for dysuria, frequency and urgency.  Musculoskeletal: Positive for joint pain.  Neurological: Negative for dizziness, weakness and headaches.    Objective:  Physical Exam: Well nourished and well developed. General: Alert and oriented x3, cooperative and pleasant, no acute distress. Head: normocephalic, atraumatic, neck supple. Eyes: EOMI.  Musculoskeletal:  Left Knee Exam:  No effusion present. No swelling present.  The  Range of motion is: 10 to 110 degrees.  Moderate crepitus on range of motion of the knee.  Positive medial joint line tenderness.  Positive lateral joint line tenderness.  The knee is stable.   Calves soft and nontender. Motor function intact in LE. Strength 5/5 LE bilaterally. Neuro: Distal pulses 2+. Sensation to light touch intact in LE.  Imaging Review Plain radiographs demonstrate severe degenerative joint disease of the left knee. The overall alignment is neutral. The bone quality appears to be adequate for age and reported activity level.  Assessment/Plan:  End stage arthritis, left knee   The patient history, physical examination, clinical judgment of the provider and imaging studies are consistent with end stage degenerative joint disease of the left knee and total knee arthroplasty is deemed medically necessary. The treatment options including medical management, injection therapy arthroscopy and arthroplasty were discussed at length. The risks and benefits of total knee arthroplasty were presented and reviewed. The risks due to aseptic loosening, infection, stiffness, patella tracking problems, thromboembolic complications and other imponderables were discussed. The patient acknowledged the explanation, agreed to proceed with the plan and consent was signed. Patient is being admitted for inpatient treatment for surgery, pain control, PT, OT, prophylactic antibiotics, VTE prophylaxis, progressive ambulation and ADLs and discharge planning. The patient is planning to be discharged home.   Patient's anticipated LOS is less than 2 midnights, meeting these requirements: - Younger than 109 - Lives within 1 hour of care - Has a competent adult at home to recover with post-op recover - NO history of  - Chronic pain requiring opiods  - Diabetes  - Coronary Artery Disease  - Heart failure  - Heart attack  - Stroke  - DVT/VTE  - Cardiac arrhythmia  - Respiratory Failure/COPD  - Renal  failure  - Anemia  - Advanced Liver disease  Therapy Plans: outpatient therapy at [facility] Disposition: Home with sister helping Planned DVT Prophylaxis: aspirin 357m BID DME needed: none PCP: Dr. MTommie ArdBaxley (requested cardiac clearance) Cardiologist: Dr. SGardiner Rhyme(echo on 3/25) TXA: IV Allergies: NKDA Anesthesia Concerns: nausea BMI: 38.9 Last HgbA1c: prediabetic, 5.7%  Other: - Staying overnight - Oxycodone is okay - opioids have made her feel depressed in the  past, she will let us know if this occurs  - Patient was instructed on what medications to stop prior to surgery. - Follow-up visit in 2 weeks with Dr. Wynelle Link - Begin physical therapy following surgery - Pre-operative lab work as pre-surgical testing - Prescriptions will be provided in hospital at time of discharge  Theresa Duty, PA-C Orthopedic Surgery [facility] [place]

## 2020-12-06 ENCOUNTER — Other Ambulatory Visit (HOSPITAL_COMMUNITY)
Admission: RE | Admit: 2020-12-06 | Discharge: 2020-12-06 | Disposition: A | Discharge status: Home / Self Care | Payer: BC Managed Care – PPO | Source: Ambulatory Visit | Attending: Orthopedic Surgery | Admitting: Orthopedic Surgery

## 2020-12-06 DIAGNOSIS — Z20822 Contact with and (suspected) exposure to covid-19: Secondary | ICD-10-CM | POA: Insufficient documentation

## 2020-12-06 DIAGNOSIS — Z01812 Encounter for preprocedural laboratory examination: Secondary | ICD-10-CM | POA: Diagnosis not present

## 2020-12-06 LAB — SARS CORONAVIRUS 2 (TAT 6-24 HRS): SARS Coronavirus 2: NEGATIVE

## 2020-12-08 MED ORDER — BUPIVACAINE LIPOSOME 1.3 % IJ SUSP
20.0000 mL | INTRAMUSCULAR | Status: DC
  Filled 2020-12-08: qty 20

## 2020-12-09 ENCOUNTER — Observation Stay (HOSPITAL_COMMUNITY)
Admission: RE | Admit: 2020-12-09 | Discharge: 2020-12-10 | Disposition: A | Discharge status: Home / Self Care | Payer: BC Managed Care – PPO | Attending: Orthopedic Surgery | Admitting: Orthopedic Surgery

## 2020-12-09 ENCOUNTER — Encounter (HOSPITAL_COMMUNITY)
Admission: RE | Disposition: A | Discharge status: Home / Self Care | Payer: Self-pay | Source: Home / Self Care | Attending: Orthopedic Surgery

## 2020-12-09 ENCOUNTER — Encounter (HOSPITAL_COMMUNITY): Payer: Self-pay | Admitting: Orthopedic Surgery

## 2020-12-09 ENCOUNTER — Other Ambulatory Visit: Payer: Self-pay

## 2020-12-09 ENCOUNTER — Ambulatory Visit (HOSPITAL_COMMUNITY): Payer: BC Managed Care – PPO | Admitting: Certified Registered Nurse Anesthetist

## 2020-12-09 DIAGNOSIS — G8918 Other acute postprocedural pain: Secondary | ICD-10-CM | POA: Diagnosis not present

## 2020-12-09 DIAGNOSIS — Z79899 Other long term (current) drug therapy: Secondary | ICD-10-CM | POA: Insufficient documentation

## 2020-12-09 DIAGNOSIS — J45909 Unspecified asthma, uncomplicated: Secondary | ICD-10-CM | POA: Insufficient documentation

## 2020-12-09 DIAGNOSIS — E039 Hypothyroidism, unspecified: Secondary | ICD-10-CM | POA: Diagnosis not present

## 2020-12-09 DIAGNOSIS — E119 Type 2 diabetes mellitus without complications: Secondary | ICD-10-CM | POA: Diagnosis not present

## 2020-12-09 DIAGNOSIS — K219 Gastro-esophageal reflux disease without esophagitis: Secondary | ICD-10-CM | POA: Diagnosis not present

## 2020-12-09 DIAGNOSIS — M1712 Unilateral primary osteoarthritis, left knee: Secondary | ICD-10-CM | POA: Diagnosis not present

## 2020-12-09 DIAGNOSIS — E559 Vitamin D deficiency, unspecified: Secondary | ICD-10-CM | POA: Diagnosis not present

## 2020-12-09 HISTORY — PX: TOTAL KNEE ARTHROPLASTY: SHX125

## 2020-12-09 LAB — TYPE AND SCREEN
ABO/RH(D): A POS
Antibody Screen: NEGATIVE

## 2020-12-09 LAB — ABO/RH: ABO/RH(D): A POS

## 2020-12-09 SURGERY — ARTHROPLASTY, KNEE, TOTAL
Anesthesia: Spinal | Site: Knee | Laterality: Left

## 2020-12-09 MED ORDER — PHENYLEPHRINE 40 MCG/ML (10ML) SYRINGE FOR IV PUSH (FOR BLOOD PRESSURE SUPPORT)
PREFILLED_SYRINGE | INTRAVENOUS | Status: DC | PRN
  Administered 2020-12-09: 160 ug via INTRAVENOUS

## 2020-12-09 MED ORDER — PROPOFOL 1000 MG/100ML IV EMUL
INTRAVENOUS | Status: AC
  Filled 2020-12-09: qty 100

## 2020-12-09 MED ORDER — OXYCODONE HCL 5 MG PO TABS
5.0000 mg | ORAL_TABLET | ORAL | Status: DC | PRN
  Administered 2020-12-09 – 2020-12-10 (×5): 10 mg via ORAL
  Filled 2020-12-09 (×5): qty 2

## 2020-12-09 MED ORDER — ALBUTEROL SULFATE HFA 108 (90 BASE) MCG/ACT IN AERS: 2.0000 | INHALATION_SPRAY | Freq: Four times a day (QID) | RESPIRATORY_TRACT | Status: DC | PRN

## 2020-12-09 MED ORDER — 0.9 % SODIUM CHLORIDE (POUR BTL) OPTIME
TOPICAL | Status: DC | PRN
  Administered 2020-12-09: 1000 mL

## 2020-12-09 MED ORDER — PROPOFOL 10 MG/ML IV BOLUS
INTRAVENOUS | Status: AC
  Filled 2020-12-09: qty 20

## 2020-12-09 MED ORDER — MIDAZOLAM HCL 2 MG/2ML IJ SOLN
1.0000 mg | INTRAMUSCULAR | Status: DC
  Administered 2020-12-09: 2 mg via INTRAVENOUS
  Filled 2020-12-09: qty 2

## 2020-12-09 MED ORDER — ASPIRIN EC 325 MG PO TBEC
325.0000 mg | DELAYED_RELEASE_TABLET | Freq: Two times a day (BID) | ORAL | Status: DC
  Administered 2020-12-10: 325 mg via ORAL
  Filled 2020-12-09: qty 1

## 2020-12-09 MED ORDER — ONDANSETRON HCL 4 MG/2ML IJ SOLN
INTRAMUSCULAR | Status: AC
  Filled 2020-12-09: qty 2

## 2020-12-09 MED ORDER — PHENYLEPHRINE 40 MCG/ML (10ML) SYRINGE FOR IV PUSH (FOR BLOOD PRESSURE SUPPORT)
PREFILLED_SYRINGE | INTRAVENOUS | Status: AC
  Filled 2020-12-09: qty 10

## 2020-12-09 MED ORDER — LEVOTHYROXINE SODIUM 75 MCG PO TABS
150.0000 ug | ORAL_TABLET | Freq: Every day | ORAL | Status: DC
  Administered 2020-12-10: 150 ug via ORAL
  Filled 2020-12-09: qty 2

## 2020-12-09 MED ORDER — PROPOFOL 10 MG/ML IV BOLUS
INTRAVENOUS | Status: DC | PRN
  Administered 2020-12-09: 20 mg via INTRAVENOUS

## 2020-12-09 MED ORDER — EZETIMIBE 10 MG PO TABS
10.0000 mg | ORAL_TABLET | Freq: Every day | ORAL | Status: DC
  Filled 2020-12-09: qty 1

## 2020-12-09 MED ORDER — GABAPENTIN 300 MG PO CAPS
300.0000 mg | ORAL_CAPSULE | Freq: Three times a day (TID) | ORAL | Status: DC
  Administered 2020-12-09 – 2020-12-10 (×3): 300 mg via ORAL
  Filled 2020-12-09 (×3): qty 1

## 2020-12-09 MED ORDER — FLEET ENEMA 7-19 GM/118ML RE ENEM: 1.0000 | ENEMA | Freq: Once | RECTAL | Status: DC | PRN

## 2020-12-09 MED ORDER — CLONIDINE HCL (ANALGESIA) 100 MCG/ML EP SOLN
EPIDURAL | Status: DC | PRN
  Administered 2020-12-09: 50 ug

## 2020-12-09 MED ORDER — ONDANSETRON HCL 4 MG/2ML IJ SOLN
INTRAMUSCULAR | Status: DC | PRN
  Administered 2020-12-09: 4 mg via INTRAVENOUS

## 2020-12-09 MED ORDER — PHENYLEPHRINE HCL-NACL 10-0.9 MG/250ML-% IV SOLN
INTRAVENOUS | Status: DC | PRN
  Administered 2020-12-09: 50 ug/min via INTRAVENOUS

## 2020-12-09 MED ORDER — DEXAMETHASONE SODIUM PHOSPHATE 10 MG/ML IJ SOLN
8.0000 mg | Freq: Once | INTRAMUSCULAR | Status: AC
  Administered 2020-12-09: 8 mg via INTRAVENOUS

## 2020-12-09 MED ORDER — POVIDONE-IODINE 10 % EX SWAB
2.0000 "application " | Freq: Once | CUTANEOUS | Status: AC
  Administered 2020-12-09: 2 via TOPICAL

## 2020-12-09 MED ORDER — ACETAMINOPHEN 10 MG/ML IV SOLN
1000.0000 mg | Freq: Four times a day (QID) | INTRAVENOUS | Status: DC
  Administered 2020-12-09: 1000 mg via INTRAVENOUS
  Filled 2020-12-09: qty 100

## 2020-12-09 MED ORDER — SODIUM CHLORIDE (PF) 0.9 % IJ SOLN
INTRAMUSCULAR | Status: DC | PRN
  Administered 2020-12-09: 60 mL

## 2020-12-09 MED ORDER — PROPOFOL 500 MG/50ML IV EMUL
INTRAVENOUS | Status: DC | PRN
  Administered 2020-12-09: 90 ug/kg/min via INTRAVENOUS

## 2020-12-09 MED ORDER — METHOCARBAMOL 500 MG PO TABS
500.0000 mg | ORAL_TABLET | Freq: Four times a day (QID) | ORAL | Status: DC | PRN
  Administered 2020-12-09 (×2): 500 mg via ORAL
  Filled 2020-12-09 (×2): qty 1

## 2020-12-09 MED ORDER — ORAL CARE MOUTH RINSE: 15.0000 mL | Freq: Once | OROMUCOSAL | Status: AC

## 2020-12-09 MED ORDER — ROPIVACAINE HCL 7.5 MG/ML IJ SOLN
INTRAMUSCULAR | Status: DC | PRN
  Administered 2020-12-09: 20 mL via PERINEURAL

## 2020-12-09 MED ORDER — TRANEXAMIC ACID-NACL 1000-0.7 MG/100ML-% IV SOLN
1000.0000 mg | INTRAVENOUS | Status: AC
  Administered 2020-12-09: 1000 mg via INTRAVENOUS
  Filled 2020-12-09: qty 100

## 2020-12-09 MED ORDER — SODIUM CHLORIDE 0.9 % IR SOLN
Status: DC | PRN
  Administered 2020-12-09: 1000 mL

## 2020-12-09 MED ORDER — CHLORHEXIDINE GLUCONATE 0.12 % MT SOLN
15.0000 mL | Freq: Once | OROMUCOSAL | Status: AC
  Administered 2020-12-09: 15 mL via OROMUCOSAL

## 2020-12-09 MED ORDER — POLYETHYLENE GLYCOL 3350 17 G PO PACK: 17.0000 g | PACK | Freq: Every day | ORAL | Status: DC | PRN

## 2020-12-09 MED ORDER — BISACODYL 10 MG RE SUPP
10.0000 mg | Freq: Every day | RECTAL | Status: DC | PRN
Start: 2020-12-09 — End: 2020-12-10

## 2020-12-09 MED ORDER — SODIUM CHLORIDE 0.9 % IV SOLN: INTRAVENOUS | Status: DC

## 2020-12-09 MED ORDER — ALPRAZOLAM 0.5 MG PO TABS
0.5000 mg | ORAL_TABLET | Freq: Three times a day (TID) | ORAL | Status: DC | PRN
  Administered 2020-12-09: 0.5 mg via ORAL
  Filled 2020-12-09: qty 1

## 2020-12-09 MED ORDER — DEXAMETHASONE SODIUM PHOSPHATE 10 MG/ML IJ SOLN
10.0000 mg | Freq: Once | INTRAMUSCULAR | Status: DC
  Filled 2020-12-09: qty 1

## 2020-12-09 MED ORDER — PANTOPRAZOLE SODIUM 40 MG PO TBEC: 40.0000 mg | DELAYED_RELEASE_TABLET | Freq: Every day | ORAL | Status: DC

## 2020-12-09 MED ORDER — BUPIVACAINE IN DEXTROSE 0.75-8.25 % IT SOLN
INTRATHECAL | Status: DC | PRN
  Administered 2020-12-09: 1.6 mL via INTRATHECAL

## 2020-12-09 MED ORDER — ONDANSETRON HCL 4 MG/2ML IJ SOLN: 4.0000 mg | Freq: Four times a day (QID) | INTRAMUSCULAR | Status: DC | PRN

## 2020-12-09 MED ORDER — LACTATED RINGERS IV SOLN: INTRAVENOUS | Status: DC

## 2020-12-09 MED ORDER — METOCLOPRAMIDE HCL 5 MG/ML IJ SOLN: 5.0000 mg | Freq: Three times a day (TID) | INTRAMUSCULAR | Status: DC | PRN

## 2020-12-09 MED ORDER — BUPIVACAINE LIPOSOME 1.3 % IJ SUSP
INTRAMUSCULAR | Status: DC | PRN
  Administered 2020-12-09: 20 mL

## 2020-12-09 MED ORDER — DEXAMETHASONE SODIUM PHOSPHATE 10 MG/ML IJ SOLN
INTRAMUSCULAR | Status: AC
  Filled 2020-12-09: qty 1

## 2020-12-09 MED ORDER — ONDANSETRON HCL 4 MG/2ML IJ SOLN: 4.0000 mg | Freq: Once | INTRAMUSCULAR | Status: DC | PRN

## 2020-12-09 MED ORDER — ACETAMINOPHEN 500 MG PO TABS
1000.0000 mg | ORAL_TABLET | Freq: Four times a day (QID) | ORAL | Status: AC
  Administered 2020-12-09 – 2020-12-10 (×4): 1000 mg via ORAL
  Filled 2020-12-09 (×5): qty 2

## 2020-12-09 MED ORDER — DIPHENHYDRAMINE HCL 12.5 MG/5ML PO ELIX: 12.5000 mg | ORAL_SOLUTION | ORAL | Status: DC | PRN

## 2020-12-09 MED ORDER — MORPHINE SULFATE (PF) 2 MG/ML IV SOLN: 0.5000 mg | INTRAVENOUS | Status: DC | PRN

## 2020-12-09 MED ORDER — METHOCARBAMOL 500 MG IVPB - SIMPLE MED
500.0000 mg | Freq: Four times a day (QID) | INTRAVENOUS | Status: DC | PRN
  Filled 2020-12-09: qty 50

## 2020-12-09 MED ORDER — PHENOL 1.4 % MT LIQD: 1.0000 | OROMUCOSAL | Status: DC | PRN

## 2020-12-09 MED ORDER — DOCUSATE SODIUM 100 MG PO CAPS
100.0000 mg | ORAL_CAPSULE | Freq: Two times a day (BID) | ORAL | Status: DC
  Administered 2020-12-09 – 2020-12-10 (×3): 100 mg via ORAL
  Filled 2020-12-09 (×3): qty 1

## 2020-12-09 MED ORDER — STERILE WATER FOR IRRIGATION IR SOLN
Status: DC | PRN
  Administered 2020-12-09: 2000 mL

## 2020-12-09 MED ORDER — MENTHOL 3 MG MT LOZG: 1.0000 | LOZENGE | OROMUCOSAL | Status: DC | PRN

## 2020-12-09 MED ORDER — 0.9 % SODIUM CHLORIDE (POUR BTL) OPTIME: TOPICAL | Status: DC | PRN

## 2020-12-09 MED ORDER — TRAMADOL HCL 50 MG PO TABS
50.0000 mg | ORAL_TABLET | Freq: Four times a day (QID) | ORAL | Status: DC | PRN
  Administered 2020-12-09: 100 mg via ORAL
  Filled 2020-12-09: qty 2

## 2020-12-09 MED ORDER — PHENYLEPHRINE 40 MCG/ML (10ML) SYRINGE FOR IV PUSH (FOR BLOOD PRESSURE SUPPORT)
PREFILLED_SYRINGE | INTRAVENOUS | Status: AC
  Filled 2020-12-09: qty 20

## 2020-12-09 MED ORDER — FENTANYL CITRATE (PF) 100 MCG/2ML IJ SOLN: 25.0000 ug | INTRAMUSCULAR | Status: DC | PRN

## 2020-12-09 MED ORDER — ONDANSETRON HCL 4 MG PO TABS: 4.0000 mg | ORAL_TABLET | Freq: Four times a day (QID) | ORAL | Status: DC | PRN

## 2020-12-09 MED ORDER — EPHEDRINE 5 MG/ML INJ
INTRAVENOUS | Status: AC
  Filled 2020-12-09: qty 10

## 2020-12-09 MED ORDER — CEFAZOLIN SODIUM-DEXTROSE 2-4 GM/100ML-% IV SOLN
2.0000 g | INTRAVENOUS | Status: AC
  Administered 2020-12-09: 2 g via INTRAVENOUS
  Filled 2020-12-09: qty 100

## 2020-12-09 MED ORDER — FENTANYL CITRATE (PF) 100 MCG/2ML IJ SOLN
50.0000 ug | INTRAMUSCULAR | Status: DC
  Administered 2020-12-09: 50 ug via INTRAVENOUS
  Filled 2020-12-09: qty 2

## 2020-12-09 MED ORDER — CEFAZOLIN SODIUM-DEXTROSE 2-4 GM/100ML-% IV SOLN
2.0000 g | Freq: Four times a day (QID) | INTRAVENOUS | Status: AC
  Administered 2020-12-09 (×2): 2 g via INTRAVENOUS
  Filled 2020-12-09 (×2): qty 100

## 2020-12-09 MED ORDER — METOCLOPRAMIDE HCL 5 MG PO TABS
5.0000 mg | ORAL_TABLET | Freq: Three times a day (TID) | ORAL | Status: DC | PRN
Start: 2020-12-09 — End: 2020-12-10

## 2020-12-09 SURGICAL SUPPLY — 56 items
ATTUNE MED DOME PAT 38 KNEE (Knees) ×1 IMPLANT
ATTUNE MED DOME PAT 38MM KNEE (Knees) ×1 IMPLANT
ATTUNE PS FEM LT SZ 6 CEM KNEE (Femur) ×2 IMPLANT
ATTUNE PSRP INSR SZ6 10 KNEE (Insert) ×1 IMPLANT
ATTUNE PSRP INSR SZ6 10MM KNEE (Insert) ×1 IMPLANT
BAG SPEC THK2 15X12 ZIP CLS (MISCELLANEOUS)
BAG ZIPLOCK 12X15 (MISCELLANEOUS) ×1 IMPLANT
BASE TIBIAL ROT PLAT SZ 5 KNEE (Knees) IMPLANT
BLADE SAG 18X100X1.27 (BLADE) ×3 IMPLANT
BLADE SAW SGTL 11.0X1.19X90.0M (BLADE) ×3 IMPLANT
BNDG ELASTIC 6X5.8 VLCR STR LF (GAUZE/BANDAGES/DRESSINGS) ×3 IMPLANT
BOWL SMART MIX CTS (DISPOSABLE) ×3 IMPLANT
BSPLAT TIB 5 CMNT ROT PLAT STR (Knees) ×1 IMPLANT
CEMENT HV SMART SET (Cement) ×6 IMPLANT
CLOSURE WOUND 1/2 X4 (GAUZE/BANDAGES/DRESSINGS) ×1
COVER SURGICAL LIGHT HANDLE (MISCELLANEOUS) ×3 IMPLANT
COVER WAND RF STERILE (DRAPES) IMPLANT
CUFF TOURN SGL QUICK 34 (TOURNIQUET CUFF) ×3
CUFF TRNQT CYL 34X4.125X (TOURNIQUET CUFF) ×1 IMPLANT
DECANTER SPIKE VIAL GLASS SM (MISCELLANEOUS) ×3 IMPLANT
DRAPE U-SHAPE 47X51 STRL (DRAPES) ×3 IMPLANT
DRSG AQUACEL AG ADV 3.5X10 (GAUZE/BANDAGES/DRESSINGS) ×3 IMPLANT
DURAPREP 26ML APPLICATOR (WOUND CARE) ×3 IMPLANT
ELECT REM PT RETURN 15FT ADLT (MISCELLANEOUS) ×3 IMPLANT
GLOVE SRG 8 PF TXTR STRL LF DI (GLOVE) ×1 IMPLANT
GLOVE SURG ENC MOIS LTX SZ6.5 (GLOVE) ×1 IMPLANT
GLOVE SURG ENC MOIS LTX SZ8 (GLOVE) ×6 IMPLANT
GLOVE SURG UNDER POLY LF SZ6.5 (GLOVE) ×1 IMPLANT
GLOVE SURG UNDER POLY LF SZ8 (GLOVE) ×3
GLOVE SURG UNDER POLY LF SZ8.5 (GLOVE) ×3 IMPLANT
GOWN STRL REUS W/TWL LRG LVL3 (GOWN DISPOSABLE) ×6 IMPLANT
GOWN STRL REUS W/TWL XL LVL3 (GOWN DISPOSABLE) ×3 IMPLANT
HANDPIECE INTERPULSE COAX TIP (DISPOSABLE) ×3
HOLDER FOLEY CATH W/STRAP (MISCELLANEOUS) ×2 IMPLANT
IMMOBILIZER KNEE 20 (SOFTGOODS) ×3
IMMOBILIZER KNEE 20 THIGH 36 (SOFTGOODS) ×1 IMPLANT
KIT TURNOVER KIT A (KITS) ×3 IMPLANT
MANIFOLD NEPTUNE II (INSTRUMENTS) ×3 IMPLANT
NS IRRIG 1000ML POUR BTL (IV SOLUTION) ×3 IMPLANT
PACK TOTAL KNEE CUSTOM (KITS) ×3 IMPLANT
PADDING CAST COTTON 6X4 STRL (CAST SUPPLIES) ×4 IMPLANT
PENCIL SMOKE EVACUATOR (MISCELLANEOUS) ×3 IMPLANT
PIN DRILL FIX HALF THREAD (BIT) ×2 IMPLANT
PIN STEINMAN FIXATION KNEE (PIN) ×2 IMPLANT
PROTECTOR NERVE ULNAR (MISCELLANEOUS) ×3 IMPLANT
SET HNDPC FAN SPRY TIP SCT (DISPOSABLE) ×1 IMPLANT
STRIP CLOSURE SKIN 1/2X4 (GAUZE/BANDAGES/DRESSINGS) ×3 IMPLANT
SUT MNCRL AB 4-0 PS2 18 (SUTURE) ×3 IMPLANT
SUT STRATAFIX 0 PDS 27 VIOLET (SUTURE) ×3
SUT VIC AB 2-0 CT1 27 (SUTURE) ×9
SUT VIC AB 2-0 CT1 TAPERPNT 27 (SUTURE) ×3 IMPLANT
SUTURE STRATFX 0 PDS 27 VIOLET (SUTURE) ×1 IMPLANT
TIBIAL BASE ROT PLAT SZ 5 KNEE (Knees) ×3 IMPLANT
TRAY FOLEY MTR SLVR 16FR STAT (SET/KITS/TRAYS/PACK) ×3 IMPLANT
WATER STERILE IRR 1000ML POUR (IV SOLUTION) ×6 IMPLANT
WRAP KNEE MAXI GEL POST OP (GAUZE/BANDAGES/DRESSINGS) ×3 IMPLANT

## 2020-12-09 NOTE — Anesthesia Preprocedure Evaluation (Signed)
Anesthesia Evaluation  Patient identified by MRN, date of birth, ID band Patient awake    Reviewed: Allergy & Precautions, NPO status , Patient's Chart, lab work & pertinent test results  History of Anesthesia Complications (+) PONV and history of anesthetic complications  Airway Mallampati: II  TM Distance: >3 FB Neck ROM: Full    Dental  (+) Teeth Intact, Dental Advisory Given   Pulmonary asthma , former smoker,    Pulmonary exam normal breath sounds clear to auscultation       Cardiovascular negative cardio ROS Normal cardiovascular exam Rhythm:Regular Rate:Normal     Neuro/Psych  Headaches, PSYCHIATRIC DISORDERS Anxiety    GI/Hepatic Neg liver ROS, GERD  Medicated,  Endo/Other  diabetes, Type 2Hypothyroidism Obesity   Renal/GU negative Renal ROS     Musculoskeletal  (+) Arthritis  (Left knee), Osteoarthritis,    Abdominal   Peds  Hematology negative hematology ROS (+) Plt 238k   Anesthesia Other Findings Day of surgery medications reviewed with the patient.  Reproductive/Obstetrics                             Anesthesia Physical Anesthesia Plan  ASA: III  Anesthesia Plan: Spinal   Post-op Pain Management:  Regional for Post-op pain   Induction:   PONV Risk Score and Plan: 3 and Propofol infusion, Treatment may vary due to age or medical condition, Midazolam, Dexamethasone and Ondansetron  Airway Management Planned: Nasal Cannula and Natural Airway  Additional Equipment:   Intra-op Plan:   Post-operative Plan:   Informed Consent: I have reviewed the patients History and Physical, chart, labs and discussed the procedure including the risks, benefits and alternatives for the proposed anesthesia with the patient or authorized representative who has indicated his/her understanding and acceptance.     Dental advisory given  Plan Discussed with: CRNA, Anesthesiologist and  Surgeon  Anesthesia Plan Comments:         Anesthesia Quick Evaluation

## 2020-12-09 NOTE — Anesthesia Postprocedure Evaluation (Signed)
Anesthesia Post Note  Patient: Mckenzie Hebert  Procedure(s) Performed: TOTAL KNEE ARTHROPLASTY (Left Knee)     Patient location during evaluation: PACU Anesthesia Type: Spinal Level of consciousness: oriented, awake and alert and awake Pain management: pain level controlled Vital Signs Assessment: post-procedure vital signs reviewed and stable Respiratory status: spontaneous breathing, respiratory function stable and nonlabored ventilation Cardiovascular status: blood pressure returned to baseline and stable Postop Assessment: no headache, no backache, no apparent nausea or vomiting, patient able to bend at knees and spinal receding Anesthetic complications: no   No complications documented.  Last Vitals:  Vitals:   12/09/20 1120 12/09/20 1350  BP: 128/78 137/82  Pulse: 60 88  Resp: 16 20  Temp: (!) 36.4 C (!) 36.4 C  SpO2: 96% 98%    Last Pain:  Vitals:   12/09/20 1350  TempSrc: Oral  PainSc:                  Catalina Gravel

## 2020-12-09 NOTE — Anesthesia Procedure Notes (Signed)
Anesthesia Regional Block: Adductor canal block   Pre-Anesthetic Checklist: ,, timeout performed, Correct Patient, Correct Site, Correct Laterality, Correct Procedure, Correct Position, site marked, Risks and benefits discussed,  Surgical consent,  Pre-op evaluation,  At surgeon's request and post-op pain management  Laterality: Left  Prep: chloraprep       Needles:  Injection technique: Single-shot  Needle Type: Echogenic Needle     Needle Length: 9cm  Needle Gauge: 21     Additional Needles:   Procedures:,,,, ultrasound used (permanent image in chart),,,,  Narrative:  Start time: 12/09/2020 7:37 AM End time: 12/09/2020 7:42 AM Injection made incrementally with aspirations every 5 mL.  Performed by: Personally  Anesthesiologist: Catalina Gravel, MD  Additional Notes: No pain on injection. No increased resistance to injection. Injection made in 5cc increments.  Good needle visualization.  Patient tolerated procedure well.

## 2020-12-09 NOTE — Discharge Instructions (Signed)
 Mckenzie Aluisio, MD Total Joint Specialist [facility] [address] [place], [place] [address] [phone]  TOTAL KNEE REPLACEMENT POSTOPERATIVE DIRECTIONS    Knee Rehabilitation, Guidelines Following Surgery  Results after knee surgery are often greatly improved when you follow the exercise, range of motion and muscle strengthening exercises prescribed by your doctor. Safety measures are also important to protect the knee from further injury. If any of these exercises cause you to have increased pain or swelling in your knee joint, decrease the amount until you are comfortable again and slowly increase them. If you have problems or questions, call your caregiver or physical therapist for advice.   BLOOD CLOT PREVENTION . Take a 325 mg Aspirin two times a day for three weeks following surgery. Then take an 81 mg Aspirin once a day for three weeks. Then discontinue Aspirin. . You may resume your vitamins/supplements upon discharge from the hospital. . Do not take any NSAIDs (Advil, Aleve, Ibuprofen, Meloxicam, etc.) until you have discontinued the 325 mg Aspirin.  HOME CARE INSTRUCTIONS  . Remove items at home which could result in a fall. This includes throw rugs or furniture in walking pathways.  . ICE to the affected knee as much as tolerated. Icing helps control swelling. If the swelling is well controlled you will be more comfortable and rehab easier. Continue to use ice on the knee for pain and swelling from surgery. You may notice swelling that will progress down to the foot and ankle. This is normal after surgery. Elevate the leg when you are not up walking on it.    . Continue to use the breathing machine which will help keep your temperature down. It is common for your temperature to cycle up and down following surgery, especially at night when you are not up moving around and exerting yourself. The breathing machine keeps your lungs expanded and your  temperature down. . Do not place pillow under the operative knee, focus on keeping the knee straight while resting  DIET You may resume your previous home diet once you are discharged from the hospital.  DRESSING / WOUND CARE / SHOWERING . Keep your bulky bandage on for 2 days. On the third post-operative day you may remove the Ace bandage and gauze. There is a waterproof adhesive bandage on your skin which will stay in place until your first follow-up appointment. Once you remove this you will not need to place another bandage . You may begin showering 3 days following surgery, but do not submerge the incision under water.  ACTIVITY For the first 5 days, the key is rest and control of pain and swelling . Do your home exercises twice a day starting on post-operative day 3. On the days you go to physical therapy, just do the home exercises once that day. . You should rest, ice and elevate the leg for 50 minutes out of every hour. Get up and walk/stretch for 10 minutes per hour. After 5 days you can increase your activity slowly as tolerated. . Walk with your walker as instructed. Use the walker until you are comfortable transitioning to a cane. Walk with the cane in the opposite hand of the operative leg. You may discontinue the cane once you are comfortable and walking steadily. . Avoid periods of inactivity such as sitting longer than an hour when not asleep. This helps prevent blood clots.  . You may discontinue the knee immobilizer once you are able to perform a straight   leg raise while lying down. . You may resume a sexual relationship in one month or when given the OK by your doctor.  . You may return to work once you are cleared by your doctor.  . Do not drive a car for 6 weeks or until released by your surgeon.  . Do not drive while taking narcotics.  TED HOSE STOCKINGS Wear the elastic stockings on both legs for three weeks following surgery during the day. You may remove them at night  for sleeping.  WEIGHT BEARING Weight bearing as tolerated with assist device (walker, cane, etc) as directed, use it as long as suggested by your surgeon or therapist, typically at least 4-6 weeks.  POSTOPERATIVE CONSTIPATION PROTOCOL Constipation - defined medically as fewer than three stools per week and severe constipation as less than one stool per week.  One of the most common issues patients have following surgery is constipation.  Even if you have a regular bowel pattern at home, your normal regimen is likely to be disrupted due to multiple reasons following surgery.  Combination of anesthesia, postoperative narcotics, change in appetite and fluid intake all can affect your bowels.  In order to avoid complications following surgery, here are some recommendations in order to help you during your recovery period.  . Colace (docusate) - Pick up an over-the-counter form of Colace or another stool softener and take twice a day as long as you are requiring postoperative pain medications.  Take with a full glass of water daily.  If you experience loose stools or diarrhea, hold the colace until you stool forms back up. If your symptoms do not get better within 1 week or if they get worse, check with your doctor. . Dulcolax (bisacodyl) - Pick up over-the-counter and take as directed by the product packaging as needed to assist with the movement of your bowels.  Take with a full glass of water.  Use this product as needed if not relieved by Colace only.  . MiraLax (polyethylene glycol) - Pick up over-the-counter to have on hand. MiraLax is a solution that will increase the amount of water in your bowels to assist with bowel movements.  Take as directed and can mix with a glass of water, juice, soda, coffee, or tea. Take if you go more than two days without a movement. Do not use MiraLax more than once per day. Call your doctor if you are still constipated or irregular after using this medication for 7 days  in a row.  If you continue to have problems with postoperative constipation, please contact the office for further assistance and recommendations.  If you experience "the worst abdominal pain ever" or develop nausea or vomiting, please contact the office immediatly for further recommendations for treatment.  ITCHING If you experience itching with your medications, try taking only a single pain pill, or even half a pain pill at a time.  You can also use Benadryl over the counter for itching or also to help with sleep.   MEDICATIONS See your medication summary on the "After Visit Summary" that the nursing staff will review with you prior to discharge.  You may have some home medications which will be placed on hold until you complete the course of blood thinner medication.  It is important for you to complete the blood thinner medication as prescribed by your surgeon.  Continue your approved medications as instructed at time of discharge.  PRECAUTIONS . If you experience chest pain or shortness of   breath - call 911 immediately for transfer to the hospital emergency department.  . If you develop a fever greater that 101 F, purulent drainage from wound, increased redness or drainage from wound, foul odor from the wound/dressing, or calf pain - CONTACT YOUR SURGEON.                                                   FOLLOW-UP APPOINTMENTS Make sure you keep all of your appointments after your operation with your surgeon and caregivers. You should call the office at the above phone number and make an appointment for approximately two weeks after the date of your surgery or on the date instructed by your surgeon outlined in the "After Visit Summary".  RANGE OF MOTION AND STRENGTHENING EXERCISES  Rehabilitation of the knee is important following a knee injury or an operation. After just a few days of immobilization, the muscles of the thigh which control the knee become weakened and shrink (atrophy). Knee  exercises are designed to build up the tone and strength of the thigh muscles and to improve knee motion. Often times heat used for twenty to thirty minutes before working out will loosen up your tissues and help with improving the range of motion but do not use heat for the first two weeks following surgery. These exercises can be done on a training (exercise) mat, on the floor, on a table or on a bed. Use what ever works the best and is most comfortable for you Knee exercises include:  . Leg Lifts - While your knee is still immobilized in a splint or cast, you can do straight leg raises. Lift the leg to 60 degrees, hold for 3 sec, and slowly lower the leg. Repeat 10-20 times 2-3 times daily. Perform this exercise against resistance later as your knee gets better.  Javier Docker and Hamstring Sets - Tighten up the muscle on the front of the thigh (Quad) and hold for 5-10 sec. Repeat this 10-20 times hourly. Hamstring sets are done by pushing the foot backward against an object and holding for 5-10 sec. Repeat as with quad sets.   Leg Slides: Lying on your back, slowly slide your foot toward your buttocks, bending your knee up off the floor (only go as far as is comfortable). Then slowly slide your foot back down until your leg is flat on the floor again.  Angel Wings: Lying on your back spread your legs to the side as far apart as you can without causing discomfort.  A rehabilitation program following serious knee injuries can speed recovery and prevent re-injury in the future due to weakened muscles. Contact your doctor or a physical therapist for more information on knee rehabilitation.   POST-OPERATIVE OPIOID TAPER INSTRUCTIONS: . It is important to wean off of your opioid medication as soon as possible. If you do not need pain medication after your surgery it is ok to stop day one. Marland Kitchen Opioids include: o Codeine, Hydrocodone(Norco, Vicodin), Oxycodone(Percocet, oxycontin) and hydromorphone amongst others.   . Long term and even short term use of opiods can cause: o Increased pain response o Dependence o Constipation o Depression o Respiratory depression o And more.  . Withdrawal symptoms can include o Flu like symptoms o Nausea, vomiting o And more . Techniques to manage these symptoms o Hydrate well o Eat regular  healthy meals o Stay active o Use relaxation techniques(deep breathing, meditating, yoga) . Do Not substitute Alcohol to help with tapering . If you have been on opioids for less than two weeks and do not have pain than it is ok to stop all together.  . Plan to wean off of opioids o This plan should start within one week post op of your joint replacement. o Maintain the same interval or time between taking each dose and first decrease the dose.  o Cut the total daily intake of opioids by one tablet each day o Next start to increase the time between doses. o The last dose that should be eliminated is the evening dose.     IF YOU ARE TRANSFERRED TO A SKILLED REHAB FACILITY If the patient is transferred to a skilled rehab facility following release from the hospital, a list of the current medications will be sent to the facility for the patient to continue.  When discharged from the skilled rehab facility, please have the facility set up the patient's [place] prior to being released. Also, the skilled facility will be responsible for providing the patient with their medications at time of release from the facility to include their pain medication, the muscle relaxants, and their blood thinner medication. If the patient is still at the rehab facility at time of the two week follow up appointment, the skilled rehab facility will also need to assist the patient in arranging follow up appointment in our office and any transportation needs.  MAKE SURE YOU:  . Understand these instructions.  . Get help right away if you are not doing well or get worse.   DENTAL  ANTIBIOTICS:  In most cases prophylactic antibiotics for Dental procdeures after total joint surgery are not necessary.  Exceptions are as follows:  1. History of prior total joint infection  2. Severely immunocompromised (Organ Transplant, cancer chemotherapy, Rheumatoid biologic meds such as [place])  3. Poorly controlled diabetes (A1C &gt; 8.0, blood glucose over 200)  If you have one of these conditions, contact your surgeon for an antibiotic prescription, prior to your dental procedure.    Pick up stool softner and laxative for home use following surgery while on pain medications. Do not submerge incision under water. Please use good hand washing techniques while changing dressing each day. May shower starting three days after surgery. Please use a clean towel to pat the incision dry following showers. Continue to use ice for pain and swelling after surgery. Do not use any lotions or creams on the incision until instructed by your surgeon.

## 2020-12-09 NOTE — Interval H&P Note (Signed)
History and Physical Interval Note:  12/09/2020 7:10 AM  Mckenzie Hebert  has presented today for surgery, with the diagnosis of left knee osteoarthritis.  The various methods of treatment have been discussed with the patient and family. After consideration of risks, benefits and other options for treatment, the patient has consented to  Procedure(s) with comments: TOTAL KNEE ARTHROPLASTY (Left) - 53min as a surgical intervention.  The patient's history has been reviewed, patient examined, no change in status, stable for surgery.  I have reviewed the patient's chart and labs.  Questions were answered to the patient's satisfaction.     Pilar Plate Kamarian Sahakian

## 2020-12-09 NOTE — Transfer of Care (Signed)
Immediate Anesthesia Transfer of Care Note  Patient: [name]  Procedure(s) Performed: TOTAL KNEE ARTHROPLASTY (Left Knee)  Patient Location: PACU  Anesthesia Type:Spinal  Level of Consciousness: awake, alert  and oriented  Airway & Oxygen Therapy: Patient Spontanous Breathing and Patient connected to face mask  Post-op Assessment: Report given to RN and Post -op Vital signs reviewed and stable  Post vital signs: Reviewed and stable  Last Vitals:  Vitals Value Taken Time  BP 109/56 12/09/20 1011  Temp 36.6 C 12/09/20 1011  Pulse 73 12/09/20 1018  Resp 13 12/09/20 1018  SpO2 97 % 12/09/20 1018  Vitals shown include unvalidated device data.  Last Pain:  Vitals:   12/09/20 0721  TempSrc: Oral  PainSc: 3       Patients Stated Pain Goal: 3 (32/76/14 7092)  Complications: No complications documented.

## 2020-12-09 NOTE — Evaluation (Signed)
Physical Therapy Evaluation Patient Details Name: Mckenzie Hebert MRN: [id number] DOB: 1956/03/20 Today's Date: 12/09/2020   History of Present Illness  65 y.o. female admitted for L TKA on 12/09/20. PMH includes DM, obesity, asthma.  Clinical Impression  Pt is s/p TKA resulting in the deficits listed below (see PT Problem List). Pt ambulated 31' with RW, no loss of balance. Initiated TKA HEP. Good progress expected.  Pt will benefit from skilled PT to increase their independence and safety with mobility to allow discharge to the venue listed below.      Follow Up Recommendations Outpatient PT;Follow surgeon's recommendation for DC plan and follow-up therapies    Equipment Recommendations  None recommended by PT    Recommendations for Other Services       Precautions / Restrictions Precautions Precautions: Knee Precaution Comments: instructed pt/sister in no pillow under knee Restrictions Weight Bearing Restrictions: No Other Position/Activity Restrictions: WBAT      Mobility  Bed Mobility Overal bed mobility: Modified Independent             General bed mobility comments: HOB up, used bedrail    Transfers Overall transfer level: Needs assistance Equipment used: Rolling walker (2 wheeled) Transfers: Sit to/from Stand Sit to Stand: Min assist         General transfer comment: VCs hand placement, min A to power up  Ambulation/Gait Ambulation/Gait assistance: Min guard Gait Distance (Feet): 80 Feet Assistive device: Rolling walker (2 wheeled) Gait Pattern/deviations: Step-to pattern;Decreased step length - right;Decreased step length - left Gait velocity: decr   General Gait Details: steady, no loss of balance, VCs sequencing  Stairs            Wheelchair Mobility    Modified Rankin (Stroke Patients Only)       Balance Overall balance assessment: Modified Independent                                           Pertinent  Vitals/Pain Pain Assessment: 0-10 Pain Score: 5  Pain Location: L knee Pain Descriptors / Indicators: Sore Pain Intervention(s): Limited activity within patient's tolerance;Monitored during session;Premedicated before session;Ice applied    Home Living Family/patient expects to be discharged to:: Private residence Living Arrangements: Alone Available Help at Discharge: Family;Available PRN/intermittently Type of Home: House Home Access: Stairs to enter Entrance Stairs-Rails: Left Entrance Stairs-Number of Steps: 5 Home Layout: One level Home Equipment: Walker - 2 wheels;Cane - single point;Grab bars - toilet;Toilet riser Additional Comments: 3 falls in past 6 months 2* RLE buckling/tripping on rugs, sister will stay with pt prn    Prior Function Level of Independence: Independent with assistive device(s)         Comments: used SPC     Hand Dominance        Extremity/Trunk Assessment   Upper Extremity Assessment Upper Extremity Assessment: Overall WFL for tasks assessed    Lower Extremity Assessment Lower Extremity Assessment: LLE deficits/detail LLE Deficits / Details: SLR 3/5 LLE, knee AAROM 5-40* LLE Sensation: WNL LLE Coordination: WNL    Cervical / Trunk Assessment Cervical / Trunk Assessment: Normal  Communication   Communication: No difficulties  Cognition Arousal/Alertness: Awake/alert Behavior During Therapy: WFL for tasks assessed/performed Overall Cognitive Status: Within Functional Limits for tasks assessed  General Comments      Exercises Total Joint Exercises Ankle Circles/Pumps: AROM;Both;10 reps;Supine Quad Sets: AROM;Both;5 reps;Supine Heel Slides: AAROM;Left;10 reps;Supine Long Arc Quad: AROM;Left;5 reps;Seated Goniometric ROM: 5-40* AAROM L knee   Assessment/Plan    PT Assessment    PT Problem List         PT Treatment Interventions      PT Goals (Current goals can be  found in the Care Plan section)  Acute Rehab PT Goals 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, adopt rescue dogs PT Goal Formulation: With patient/family Time For Goal Achievement: 12/16/20 Potential to Achieve Goals: Good    Frequency     Barriers to discharge        Co-evaluation               AM-PAC PT "6 Clicks" Mobility  Outcome Measure Help needed turning from your back to your side while in a flat bed without using bedrails?: A Little Help needed moving from lying on your back to sitting on the side of a flat bed without using bedrails?: A Little Help needed moving to and from a bed to a chair (including a wheelchair)?: A Little Help needed standing up from a chair using your arms (e.g., wheelchair or bedside chair)?: A Little Help needed to walk in hospital room?: A Little Help needed climbing 3-5 steps with a railing? : A Little 6 Click Score: 18    End of Session Equipment Utilized During Treatment: Gait belt Activity Tolerance: Patient tolerated treatment well Patient left: in chair;with call bell/phone within reach;with chair alarm set;with family/visitor present Nurse Communication: Mobility status      Time: 7782-4235 PT Time Calculation (min) (ACUTE ONLY): 18 min   Charges:   PT Evaluation $PT Eval Low Complexity: 1 Low         Philomena Doheny PT 12/09/2020  Acute Rehabilitation Services Pager [phone] Office [phone]

## 2020-12-09 NOTE — Anesthesia Procedure Notes (Signed)
Spinal  Patient location during procedure: OR Reason for block: surgical anesthesia Staffing Performed: resident/CRNA  Other anesthesia staff: Rosaland Lao, CRNA Preanesthetic Checklist Completed: patient identified, IV checked, site marked, risks and benefits discussed, surgical consent, monitors and equipment checked, pre-op evaluation and timeout performed Spinal Block Patient position: sitting Prep: DuraPrep Patient monitoring: heart rate, cardiac monitor, continuous pulse ox and blood pressure Approach: midline Location: L3-4 Injection technique: single-shot Needle Needle type: Pencan  Needle gauge: 24 G Needle length: 10 cm Assessment Sensory level: T4 Events: CSF return

## 2020-12-09 NOTE — Progress Notes (Signed)
AssistedDr. Turk with left, ultrasound guided, adductor canal block. Side rails up, monitors on throughout procedure. See vital signs in flow sheet. Tolerated Procedure well.  

## 2020-12-09 NOTE — Progress Notes (Signed)
Orthopedic Tech Progress Note Patient Details:  Mckenzie Hebert [birth year] [id number]      Post Interventions Patient Tolerated: Well Instructions Provided: Care of device   [name] 12/09/2020, 10:45 AM

## 2020-12-09 NOTE — Op Note (Signed)
OPERATIVE REPORT-TOTAL KNEE ARTHROPLASTY   Pre-operative diagnosis- Osteoarthritis  Left knee(s)  Post-operative diagnosis- Osteoarthritis Left knee(s)  Procedure-  Left  Total Knee Arthroplasty  Surgeon- Dione Plover. Maris Abascal, MD  Assistant- Molli Barrows, PA-C   Anesthesia-  Adductor canal block and spinal  EBL-75 mL   Drains None  Tourniquet time-  Total Tourniquet Time Documented: Thigh (Left) - 40 minutes Total: Thigh (Left) - 40 minutes     Complications- None  Condition-PACU - hemodynamically stable.   Brief Clinical Note  Mckenzie Hebert is a 65 y.o. year old female with end stage OA of her left knee with progressively worsening pain and dysfunction. She has constant pain, with activity and at rest and significant functional deficits with difficulties even with ADLs. She has had extensive non-op management including analgesics, injections of cortisone and viscosupplements, and home exercise program, but remains in significant pain with significant dysfunction. Radiographs show bone on bone arthritis medial and patellofemoral. She presents now for left Total Knee Arthroplasty.    Procedure in detail---   The patient is brought into the operating room and positioned supine on the operating table. After successful administration of  Adductor canal block and spinal,   a tourniquet is placed high on the  Left thigh(s) and the lower extremity is prepped and draped in the usual sterile fashion. Time out is performed by the operating team and then the  Left lower extremity is wrapped in Esmarch, knee flexed and the tourniquet inflated to 300 mmHg.       A midline incision is made with a ten blade through the subcutaneous tissue to the level of the extensor mechanism. A fresh blade is used to make a medial parapatellar arthrotomy. Soft tissue over the proximal medial tibia is subperiosteally elevated to the joint line with a knife and into the semimembranosus bursa with a Cobb  elevator. Soft tissue over the proximal lateral tibia is elevated with attention being paid to avoiding the patellar tendon on the tibial tubercle. The patella is everted, knee flexed 90 degrees and the ACL and PCL are removed. Findings are bone on bone medial and patellofemoral with massive global osteophytes.        The drill is used to create a starting hole in the distal femur and the canal is thoroughly irrigated with sterile saline to remove the fatty contents. The 5 degree Left  valgus alignment guide is placed into the femoral canal and the distal femoral cutting block is pinned to remove 9 mm off the distal femur. Resection is made with an oscillating saw.      The tibia is subluxed forward and the menisci are removed. The extramedullary alignment guide is placed referencing proximally at the medial aspect of the tibial tubercle and distally along the second metatarsal axis and tibial crest. The block is pinned to remove 5mm off the more deficient medial  side. Resection is made with an oscillating saw. Size 5is the most appropriate size for the tibia and the proximal tibia is prepared with the modular drill and keel punch for that size.      The femoral sizing guide is placed and size 6 is most appropriate. Rotation is marked off the epicondylar axis and confirmed by creating a rectangular flexion gap at 90 degrees. The size 6 cutting block is pinned in this rotation and the anterior, posterior and chamfer cuts are made with the oscillating saw. The intercondylar block is then placed and that cut is made.  Trial size 5 tibial component, trial size 6 posterior stabilized femur and a 10  mm posterior stabilized rotating platform insert trial is placed. Full extension is achieved with excellent varus/valgus and anterior/posterior balance throughout full range of motion. The patella is everted and thickness measured to be 24  mm. Free hand resection is taken to 14 mm, a 38 template is placed, lug holes  are drilled, trial patella is placed, and it tracks normally. Osteophytes are removed off the posterior femur with the trial in place. All trials are removed and the cut bone surfaces prepared with pulsatile lavage. Cement is mixed and once ready for implantation, the size 5 tibial implant, size  6 posterior stabilized femoral component, and the size 38 patella are cemented in place and the patella is held with the clamp. The trial insert is placed and the knee held in full extension. The Exparel (20 ml mixed with 60 ml saline) is injected into the extensor mechanism, posterior capsule, medial and lateral gutters and subcutaneous tissues.  All extruded cement is removed and once the cement is hard the permanent 10 mm posterior stabilized rotating platform insert is placed into the tibial tray.      The wound is copiously irrigated with saline solution and the extensor mechanism closed with # 0 Stratofix suture. The tourniquet is released for a total tourniquet time of 40  minutes. Flexion against gravity is 140 degrees and the patella tracks normally. Subcutaneous tissue is closed with 2.0 vicryl and subcuticular with running 4.0 Monocryl. The incision is cleaned and dried and steri-strips and a bulky sterile dressing are applied. The limb is placed into a knee immobilizer and the patient is awakened and transported to recovery in stable condition.      Please note that a surgical assistant was a medical necessity for this procedure in order to perform it in a safe and expeditious manner. Surgical assistant was necessary to retract the ligaments and vital neurovascular structures to prevent injury to them and also necessary for proper positioning of the limb to allow for anatomic placement of the prosthesis.   Dione Plover Domique Reardon, MD    12/09/2020, 9:41 AM

## 2020-12-10 ENCOUNTER — Encounter (HOSPITAL_COMMUNITY): Payer: Self-pay | Admitting: Orthopedic Surgery

## 2020-12-10 DIAGNOSIS — J45909 Unspecified asthma, uncomplicated: Secondary | ICD-10-CM | POA: Diagnosis not present

## 2020-12-10 DIAGNOSIS — E119 Type 2 diabetes mellitus without complications: Secondary | ICD-10-CM | POA: Diagnosis not present

## 2020-12-10 DIAGNOSIS — Z79899 Other long term (current) drug therapy: Secondary | ICD-10-CM | POA: Diagnosis not present

## 2020-12-10 DIAGNOSIS — E039 Hypothyroidism, unspecified: Secondary | ICD-10-CM | POA: Diagnosis not present

## 2020-12-10 DIAGNOSIS — M1712 Unilateral primary osteoarthritis, left knee: Secondary | ICD-10-CM | POA: Diagnosis not present

## 2020-12-10 LAB — CBC
HCT: 32.7 % — ABNORMAL LOW (ref 36.0–46.0)
Hemoglobin: 11 g/dL — ABNORMAL LOW (ref 12.0–15.0)
MCH: 27.6 pg (ref 26.0–34.0)
MCHC: 33.6 g/dL (ref 30.0–36.0)
MCV: 82.2 fL (ref 80.0–100.0)
Platelets: 221 10*3/uL (ref 150–400)
RBC: 3.98 MIL/uL (ref 3.87–5.11)
RDW: 13.6 % (ref 11.5–15.5)
WBC: 11.9 10*3/uL — ABNORMAL HIGH (ref 4.0–10.5)
nRBC: 0 % (ref 0.0–0.2)

## 2020-12-10 LAB — BASIC METABOLIC PANEL
Anion gap: 7 (ref 5–15)
BUN: 12 mg/dL (ref 8–23)
CO2: 25 mmol/L (ref 22–32)
Calcium: 8.4 mg/dL — ABNORMAL LOW (ref 8.9–10.3)
Chloride: 104 mmol/L (ref 98–111)
Creatinine, Ser: 0.54 mg/dL (ref 0.44–1.00)
GFR, Estimated: >60 mL/min (ref >60)
Glucose, Bld: 192 mg/dL — ABNORMAL HIGH (ref 70–99)
Potassium: 4.1 mmol/L (ref 3.5–5.1)
Sodium: 136 mmol/L (ref 135–145)

## 2020-12-10 MED ORDER — METHOCARBAMOL 500 MG PO TABS
500.0000 mg | ORAL_TABLET | Freq: Four times a day (QID) | ORAL | 0 refills | Status: DC | PRN
Start: 2020-12-10 — End: 2021-01-30

## 2020-12-10 MED ORDER — TRAMADOL HCL 50 MG PO TABS: 50.0000 mg | ORAL_TABLET | Freq: Four times a day (QID) | ORAL | 0 refills | Status: DC | PRN

## 2020-12-10 MED ORDER — ASPIRIN 325 MG PO TBEC: 325.0000 mg | DELAYED_RELEASE_TABLET | Freq: Two times a day (BID) | ORAL | 0 refills | Status: AC

## 2020-12-10 MED ORDER — GABAPENTIN 300 MG PO CAPS: ORAL_CAPSULE | ORAL | 0 refills | Status: DC

## 2020-12-10 MED ORDER — OXYCODONE HCL 5 MG PO TABS: 5.0000 mg | ORAL_TABLET | Freq: Four times a day (QID) | ORAL | 0 refills | Status: DC | PRN

## 2020-12-10 NOTE — Progress Notes (Signed)
Subjective: 1 Day Post-Op Procedure(s) (LRB): TOTAL KNEE ARTHROPLASTY (Left) Patient reports pain as mild.   Patient seen in rounds by Dr. Wynelle Link. Patient is well, and has had no acute complaints or problems. States she is ready to go home. Denies chest pain or SOB, no issues overnight. Foley catheter to be removed this AM. We will continue therapy today, ambulated 70' yesterday.  Objective: Vital signs in last 24 hours: Temp:  [97.5 F (36.4 C)-98.9 F (37.2 C)] 98.5 F (36.9 C) (04/05 0548) Pulse Rate:  [60-88] 79 (04/05 0548) Resp:  [9-21] 17 (04/05 0548) BP: (97-140)/(56-95) 120/64 (04/05 0548) SpO2:  [94 %-99 %] 98 % (04/05 0548)  Intake/Output from previous day:  Intake/Output Summary (Last 24 hours) at 12/10/2020 0738 Last data filed at 12/10/2020 0610 Gross per 24 hour  Intake 3089.74 ml  Output 4385 ml  Net -1295.26 ml     Intake/Output this shift: No intake/output data recorded.  Labs: Recent Labs    12/10/20 0328  HGB 11.0*   Recent Labs    12/10/20 0328  WBC 11.9*  RBC 3.98  HCT 32.7*  PLT 221   Recent Labs    12/10/20 0328  NA 136  K 4.1  CL 104  CO2 25  BUN 12  CREATININE 0.54  GLUCOSE 192*  CALCIUM 8.4*   No results for input(s): LABPT, INR in the last 72 hours.  Exam: General - Patient is Alert and Oriented Extremity - Neurologically intact Neurovascular intact Sensation intact distally Dorsiflexion/Plantar flexion intact Dressing - dressing C/D/I Motor Function - intact, moving foot and toes well on exam.   Past Medical History:  Diagnosis Date  . AC (acromioclavicular) joint bone spurs 2008   right foot  . Anxiety   . Asthma   . Back pain   . Back pain   . Bilateral swelling of feet   . Constipation   . Dyspnea   . Dysrhythmia 2019   PVC  . Endometrial polyp   . Endometrial polyp   . Heartburn   . History of colon polyps   . Hypothyroidism   . Joint pain   . Joint pain   . Migraines    Dr Catalina Gravel  .  Osteoarthritis   . Palpitations    cardiologist-  dr hochrein-- normal myoview 2017, holter 2015 mild ectopy  . PONV (postoperative nausea and vomiting)   . Pre-diabetes 2019  . SOB (shortness of breath)   . Thyroid disease   . Uterine fibroid   . Vitamin D deficiency     Assessment/Plan: 1 Day Post-Op Procedure(s) (LRB): TOTAL KNEE ARTHROPLASTY (Left) Principal Problem:   Osteoarthritis of left knee Active Problems:   Primary osteoarthritis of left knee  Estimated body mass index is 38.74 kg/m as calculated from the following:   Height as of this encounter: 5\' 6"  (1.676 m).   Weight as of this encounter: 108.9 kg. Advance diet Up with therapy D/C IV fluids   Patient's anticipated LOS is less than 2 midnights, meeting these requirements: - Younger than 54 - Lives within 1 hour of care - Has a competent adult at home to recover with post-op recover - NO history of  - Chronic pain requiring opiods  - Diabetes  - Coronary Artery Disease  - Heart failure  - Heart attack  - Stroke  - DVT/VTE  - Cardiac arrhythmia  - Respiratory Failure/COPD  - Renal failure  - Anemia  - Advanced Liver disease  DVT Prophylaxis -  Aspirin Weight bearing as tolerated. Continue therapy.  Plan is to go Home after hospital stay. Plan for discharge later today once cleared by PT. Scheduled for OPPT at [facility] Follow-up in the office in 2 weeks   The PDMP database was reviewed today prior to any opioid medications being prescribed to this patient.  Theresa Duty, PA-C Orthopedic Surgery [phone] 12/10/2020, 7:38 AM

## 2020-12-10 NOTE — Progress Notes (Addendum)
Patient informed RN that she is missing 2 pills that she had in a baggy in her room. She reported them being 1mg  Xanax.  She reports they went missing when she was walking in the hallway with physical therapy.   She is unsure who might have taken the medications but reports that 3 different people were in her room: PT, PTA, and a person from environmental services.   I informed her that I would inform my leadership, and that we would follow up.   SWhittemore, RN    Patient called back this afternoon after being discharged home, and called to inform me that she found the 2 pills in her room when she was walking to the bathroom.   I appreciate her following up, and that no medication was lost.   Herbalist, Therapist, sports

## 2020-12-10 NOTE — Progress Notes (Signed)
RN reviewed discharge instructions with patient and family. All questions answered.   Paperwork given. Prescriptions electronically sent to patient pharmacy. RN also called pharmacy to help get the prescriptions filled as the patient was receiving notifications that they were not filling her prescriptions.    NT rolled patient down with all belongings to family car.     Benedetto Goad, RN

## 2020-12-10 NOTE — Progress Notes (Signed)
Physical Therapy Treatment Patient Details Name: Mckenzie Hebert MRN: [id number] DOB: [birth year] Today's Date: 12/10/2020    History of Present Illness 65 y.o. female admitted for L TKA on 12/09/20. PMH includes DM, obesity, asthma.    PT Comments    Pt is progressing well with mobility. She ambulated 150' with RW, no loss of balance. Stair training completed. Pt demonstrates good understanding of HEP. From PT standpoint, she is ready to DC home.   Follow Up Recommendations  Outpatient PT;Follow surgeon's recommendation for DC plan and follow-up therapies     Equipment Recommendations  None recommended by PT    Recommendations for Other Services       Precautions / Restrictions Precautions Precautions: Knee Precaution Comments: reviewed no pillow under knee Restrictions Weight Bearing Restrictions: No Other Position/Activity Restrictions: WBAT    Mobility  Bed Mobility Overal bed mobility: Modified Independent             General bed mobility comments: HOB up, used bedrail    Transfers Overall transfer level: Needs assistance Equipment used: Rolling walker (2 wheeled) Transfers: Sit to/from Stand Sit to Stand: Supervision         General transfer comment: VCs hand placement  Ambulation/Gait Ambulation/Gait assistance: Supervision Gait Distance (Feet): 150 Feet Assistive device: Rolling walker (2 wheeled) Gait Pattern/deviations: Step-to pattern;Decreased step length - right;Decreased step length - left Gait velocity: decr   General Gait Details: steady, no loss of balance   Stairs Stairs: Yes Stairs assistance: Min guard Stair Management: One rail Left;Step to pattern;Forwards;With cane Number of Stairs: 3 General stair comments: VCs sequencing, min/guard for safety   Wheelchair Mobility    Modified Rankin (Stroke Patients Only)       Balance Overall balance assessment: Modified Independent                                           Cognition Arousal/Alertness: Awake/alert Behavior During Therapy: WFL for tasks assessed/performed Overall Cognitive Status: Within Functional Limits for tasks assessed                                        Exercises Total Joint Exercises Ankle Circles/Pumps: AROM;Both;10 reps;Supine Quad Sets: AROM;Both;5 reps;Supine Short Arc Quad: AROM;Left;10 reps;Supine Heel Slides: AAROM;Left;10 reps;Supine Hip ABduction/ADduction: AAROM;Left;10 reps;Supine Straight Leg Raises: AROM;Left;10 reps;Supine Long Arc Quad: AROM;Left;5 reps;Seated Knee Flexion: AAROM;Left;10 reps;Seated Goniometric ROM: 5-60* AAROM L knee    General Comments        Pertinent Vitals/Pain Pain Score: 4  Pain Location: L knee Pain Descriptors / Indicators: Sore Pain Intervention(s): Limited activity within patient's tolerance;Monitored during session;Premedicated before session;Ice applied    Home Living                      Prior Function            PT Goals (current goals can now be found in the care plan section) Acute Rehab PT Goals 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, adopt rescue dogs PT Goal Formulation: With patient/family Time For Goal Achievement: 12/16/20 Potential to Achieve Goals: Good Progress towards PT goals: Progressing toward goals    Frequency    7X/week      PT Plan Current plan remains appropriate    Co-evaluation  AM-PAC PT "6 Clicks" Mobility   Outcome Measure  Help needed turning from your back to your side while in a flat bed without using bedrails?: A Little Help needed moving from lying on your back to sitting on the side of a flat bed without using bedrails?: A Little Help needed moving to and from a bed to a chair (including a wheelchair)?: None Help needed standing up from a chair using your arms (e.g., wheelchair or bedside chair)?: None Help needed to walk in hospital room?: None Help  needed climbing 3-5 steps with a railing? : A Little 6 Click Score: 21    End of Session Equipment Utilized During Treatment: Gait belt Activity Tolerance: Patient tolerated treatment well Patient left: in chair;with call bell/phone within reach;with chair alarm set Nurse Communication: Mobility status PT Visit Diagnosis: Muscle weakness (generalized) (M62.81);Difficulty in walking, not elsewhere classified (R26.2);Pain Pain - Right/Left: Left Pain - part of body: Knee     Time: 6237-6283 PT Time Calculation (min) (ACUTE ONLY): 36 min  Charges:  $Gait Training: 8-22 mins $Therapeutic Exercise: 8-22 mins                     Blondell Reveal Kistler PT 12/10/2020  Acute Rehabilitation Services Pager [phone] Office [phone]

## 2020-12-10 NOTE — TOC Transition Note (Signed)
Transition of Care [facility]) - CM/SW Discharge Note  Patient Details  Name: Mckenzie Hebert MRN: [id number] Date of Birth: December 05, 1955  [facility] CM/SW Contact:  Sherie Don, LCSW Phone Number: 12/10/2020, 9:10 AM  Clinical Narrative: Patient expected to discharge today when cleared by PT. CSW met with patient to review discharge plan. Patient to discharge home with OPPT through [facility]. Patient has a rolling walker, cane, and grab bars installed by her high toilet, so patient does not have any DME needs at this time. TOC signing off.  Final next level of care: OP Rehab Barriers to Discharge: No Barriers Identified  Patient Goals and CMS Choice Patient states their goals for this hospitalization and ongoing recovery are:: Discharge home with [facility] CMS Medicare.gov Compare Post Acute Care list provided to:: Patient Choice offered to / list presented to : Patient  Discharge Plan and Services         DME Arranged: N/A DME Agency: NA  Readmission Risk Interventions No flowsheet data found.

## 2020-12-11 NOTE — Discharge Summary (Signed)
Physician Discharge Summary   Patient ID: Mckenzie Hebert MRN: [id number] DOB/AGE: [birth year] 65 y.o.  Admit date: 12/09/2020 Discharge date: 12/10/2020  Primary Diagnosis: Osteoarthritis, left knee   Admission Diagnoses:  Past Medical History:  Diagnosis Date  . AC (acromioclavicular) joint bone spurs 2008   right foot  . Anxiety   . Asthma   . Back pain   . Back pain   . Bilateral swelling of feet   . Constipation   . Dyspnea   . Dysrhythmia 2019   PVC  . Endometrial polyp   . Endometrial polyp   . Heartburn   . History of colon polyps   . Hypothyroidism   . Joint pain   . Joint pain   . Migraines    Dr Catalina Gravel  . Osteoarthritis   . Palpitations    cardiologist-  dr hochrein-- normal myoview 2017, holter 2015 mild ectopy  . PONV (postoperative nausea and vomiting)   . Pre-diabetes 2019  . SOB (shortness of breath)   . Thyroid disease   . Uterine fibroid   . Vitamin D deficiency    Discharge Diagnoses:   Principal Problem:   Osteoarthritis of left knee Active Problems:   Primary osteoarthritis of left knee  Estimated body mass index is 38.74 kg/m as calculated from the following:   Height as of this encounter: '5\' 6"'  (1.676 m).   Weight as of this encounter: 108.9 kg.  Procedure:  Procedure(s) (LRB): TOTAL KNEE ARTHROPLASTY (Left)   Consults: None  HPI:  Mckenzie Hebert is a 65 y.o. year old female with end stage OA of her left knee with progressively worsening pain and dysfunction. She has constant pain, with activity and at rest and significant functional deficits with difficulties even with ADLs. She has had extensive non-op management including analgesics, injections of cortisone and viscosupplements, and home exercise program, but remains in significant pain with significant dysfunction. Radiographs show bone on bone arthritis medial and patellofemoral. She presents now for left Total Knee Arthroplasty.    Laboratory Data: Admission on  12/09/2020, Discharged on 12/10/2020  Component Date Value Ref Range Status  . ABO/RH(D) 12/09/2020    Final                   Value:A POS Performed at [facility], [place] [address], [place], [place] [address]   . WBC 12/10/2020 11.9* 4.0 - 10.5 K/uL Final  . RBC 12/10/2020 3.98  3.87 - 5.11 MIL/uL Final  . Hemoglobin 12/10/2020 11.0* 12.0 - 15.0 g/dL Final  . HCT 12/10/2020 32.7* 36.0 - 46.0 % Final  . MCV 12/10/2020 82.2  80.0 - 100.0 fL Final  . MCH 12/10/2020 27.6  26.0 - 34.0 pg Final  . MCHC 12/10/2020 33.6  30.0 - 36.0 g/dL Final  . RDW 12/10/2020 13.6  11.5 - 15.5 % Final  . Platelets 12/10/2020 221  150 - 400 K/uL Final  . nRBC 12/10/2020 0.0  0.0 - 0.2 % Final   Performed at [facility], [place] [address] [place], [place] [address]  . Sodium 12/10/2020 136  135 - 145 mmol/L Final  . Potassium 12/10/2020 4.1  3.5 - 5.1 mmol/L Final  . Chloride 12/10/2020 104  98 - 111 mmol/L Final  . CO2 12/10/2020 25  22 - 32 mmol/L Final  . Glucose, Bld 12/10/2020 192* 70 - 99 mg/dL Final   Glucose reference range applies only to samples taken after fasting for at least  8 hours.  . BUN 12/10/2020 12  8 - 23 mg/dL Final  . Creatinine, Ser 12/10/2020 0.54  0.44 - 1.00 mg/dL Final  . Calcium 12/10/2020 8.4* 8.9 - 10.3 mg/dL Final  . GFR, Estimated 12/10/2020 >60  >60 mL/min Final   Comment: (NOTE) Calculated using the CKD-EPI Creatinine Equation (2021)   . Anion gap 12/10/2020 7  5 - 15 Final   Performed at [facility], [place] [address], [place], [place] [address]  Hospital Outpatient Visit on 12/06/2020  Component Date Value Ref Range Status  . SARS Coronavirus 2 12/06/2020 NEGATIVE  NEGATIVE Final   Comment: (NOTE) SARS-CoV-2 target nucleic acids are NOT DETECTED.  The SARS-CoV-2 RNA is generally detectable in upper and lower respiratory specimens during the acute phase of infection. Negative results do not preclude SARS-CoV-2  infection, do not rule out co-infections with other pathogens, and should not be used as the sole basis for treatment or other patient management decisions. Negative results must be combined with clinical observations, patient history, and epidemiological information. The expected result is Negative.  Fact Sheet for Patients: SugarRoll.be  Fact Sheet for Healthcare Providers: https://www.woods-mathews.com/  This test is not yet approved or cleared by the [place] FDA and  has been authorized for detection and/or diagnosis of SARS-CoV-2 by FDA under an Emergency Use Authorization (EUA). This EUA will remain  in effect (meaning this test can be used) for the duration of the COVID-19 declaration under Se                          ction 564(b)(1) of the Act, 21 U.S.C. section 360bbb-3(b)(1), unless the authorization is terminated or revoked sooner.  Performed at [facility], [place] [address], [place], [place] [address]   Hospital Outpatient Visit on 12/02/2020  Component Date Value Ref Range Status  . WBC 12/02/2020 5.6  4.0 - 10.5 K/uL Final  . RBC 12/02/2020 4.80  3.87 - 5.11 MIL/uL Final  . Hemoglobin 12/02/2020 13.2  12.0 - 15.0 g/dL Final  . HCT 12/02/2020 39.7  36.0 - 46.0 % Final  . MCV 12/02/2020 82.7  80.0 - 100.0 fL Final  . MCH 12/02/2020 27.5  26.0 - 34.0 pg Final  . MCHC 12/02/2020 33.2  30.0 - 36.0 g/dL Final  . RDW 12/02/2020 13.4  11.5 - 15.5 % Final  . Platelets 12/02/2020 238  150 - 400 K/uL Final  . nRBC 12/02/2020 0.0  0.0 - 0.2 % Final   Performed at [facility], [place] [address], [place], [place] [address]  . Sodium 12/02/2020 137  135 - 145 mmol/L Final  . Potassium 12/02/2020 3.9  3.5 - 5.1 mmol/L Final  . Chloride 12/02/2020 103  98 - 111 mmol/L Final  . CO2 12/02/2020 27  22 - 32 mmol/L Final  . Glucose, Bld 12/02/2020 144* 70 - 99 mg/dL Final   Glucose reference range applies only to  samples taken after fasting for at least 8 hours.  . BUN 12/02/2020 18  8 - 23 mg/dL Final  . Creatinine, Ser 12/02/2020 0.68  0.44 - 1.00 mg/dL Final  . Calcium 12/02/2020 9.3  8.9 - 10.3 mg/dL Final  . Total Protein 12/02/2020 7.0  6.5 - 8.1 g/dL Final  . Albumin 12/02/2020 4.1  3.5 - 5.0 g/dL Final  . AST 12/02/2020 23  15 - 41 U/L Final  . ALT 12/02/2020 26  0 - 44 U/L  Final  . Alkaline Phosphatase 12/02/2020 55  38 - 126 U/L Final  . Total Bilirubin 12/02/2020 0.7  0.3 - 1.2 mg/dL Final  . GFR, Estimated 12/02/2020 >60  >60 mL/min Final   Comment: (NOTE) Calculated using the CKD-EPI Creatinine Equation (2021)   . Anion gap 12/02/2020 7  5 - 15 Final   Performed at [facility], [place] [address] [place], [place] [address]  . Prothrombin Time 12/02/2020 13.3  11.4 - 15.2 seconds Final  . INR 12/02/2020 1.1  0.8 - 1.2 Final   Comment: (NOTE) INR goal varies based on device and disease states. Performed at [facility], [place] [address], [place], [place] [address]   . aPTT 12/02/2020 30  24 - 36 seconds Final   Performed at [facility], [place] [address], [place], [place] [address]  . ABO/RH(D) 12/02/2020 A POS   Final  . Antibody Screen 12/02/2020 NEG   Final  . Sample Expiration 12/02/2020 12/12/2020,2359   Final  . Extend sample reason 12/02/2020    Final                   Value:NO TRANSFUSIONS OR PREGNANCY IN THE PAST 3 MONTHS Performed at [place] [address] [place], [place] [address]   . MRSA, PCR 12/02/2020 NEGATIVE  NEGATIVE Final  . Staphylococcus aureus 12/02/2020 NEGATIVE  NEGATIVE Final   Comment: (NOTE) The Xpert SA Assay (FDA approved for NASAL specimens in patients 46 years of age and older), is one component of a comprehensive surveillance program. It is not intended to diagnose infection nor to guide or monitor treatment. Performed at [facility], [place]  [address], [place], [place] [address]   Appointment on 11/29/2020  Component Date Value Ref Range Status  . Area-P 1/2 11/29/2020 2.62  cm2 Final  . S' Lateral 11/29/2020 2.60  cm Final  Office Visit on 11/05/2020  Component Date Value Ref Range Status  . Hgb A1c MFr Bld 11/05/2020 5.7* <5.7 % of total Hgb Final   Comment: For someone without known diabetes, a hemoglobin  A1c value between 5.7% and 6.4% is consistent with prediabetes and should be confirmed with a  follow-up test. . For someone with known diabetes, a value <7% indicates that their diabetes is well controlled. A1c targets should be individualized based on duration of diabetes, age, comorbid conditions, and other considerations. . This assay result is consistent with an increased risk of diabetes. . Currently, no consensus exists regarding use of hemoglobin A1c for diagnosis of diabetes for children. .   . Mean Plasma Glucose 11/05/2020 117  mg/dL Final  . eAG (mmol/L) 11/05/2020 6.5  mmol/L Final  . WBC 11/05/2020 6.2  3.8 - 10.8 Thousand/uL Final  . RBC 11/05/2020 5.10  3.80 - 5.10 Million/uL Final  . Hemoglobin 11/05/2020 13.8  11.7 - 15.5 g/dL Final  . HCT 11/05/2020 41.3  35.0 - 45.0 % Final  . MCV 11/05/2020 81.0  80.0 - 100.0 fL Final  . MCH 11/05/2020 27.1  27.0 - 33.0 pg Final  . MCHC 11/05/2020 33.4  32.0 - 36.0 g/dL Final  . RDW 11/05/2020 13.0  11.0 - 15.0 % Final  . Platelets 11/05/2020 289  140 - 400 Thousand/uL Final  . MPV 11/05/2020 10.2  7.5 - 12.5 fL Final  . Neutro Abs 11/05/2020 3,838  1,500 - 7,800 cells/uL Final  . Lymphs Abs 11/05/2020 1,631  850 - 3,900 cells/uL  Final  . Absolute Monocytes 11/05/2020 502  200 - 950 cells/uL Final  . Eosinophils Absolute 11/05/2020 180  15 - 500 cells/uL Final  . Basophils Absolute 11/05/2020 50  0 - 200 cells/uL Final  . Neutrophils Relative % 11/05/2020 61.9  % Final  . Total Lymphocyte 11/05/2020 26.3  % Final  . Monocytes Relative 11/05/2020 8.1   % Final  . Eosinophils Relative 11/05/2020 2.9  % Final  . Basophils Relative 11/05/2020 0.8  % Final  . Glucose, Bld 11/05/2020 83  65 - 99 mg/dL Final   Comment: .            Fasting reference interval .   . BUN 11/05/2020 18  7 - 25 mg/dL Final  . Creat 11/05/2020 0.70  0.50 - 0.99 mg/dL Final   Comment: For patients >78 years of age, the reference limit for Creatinine is approximately 13% higher for people identified as African-American. .   . GFR, Est Non African American 11/05/2020 91  > OR = 60 mL/min/1.43m Final  . GFR, Est African American 11/05/2020 105  > OR = 60 mL/min/1.763mFinal  . BUN/Creatinine Ratio 0329/52/8413OT APPLICABLE  6 - 22 (calc) Final  . Sodium 11/05/2020 138  135 - 146 mmol/L Final  . Potassium 11/05/2020 4.3  3.5 - 5.3 mmol/L Final  . Chloride 11/05/2020 102  98 - 110 mmol/L Final  . CO2 11/05/2020 27  20 - 32 mmol/L Final  . Calcium 11/05/2020 9.5  8.6 - 10.4 mg/dL Final  . Total Protein 11/05/2020 7.1  6.1 - 8.1 g/dL Final  . Albumin 11/05/2020 4.4  3.6 - 5.1 g/dL Final  . Globulin 11/05/2020 2.7  1.9 - 3.7 g/dL (calc) Final  . AG Ratio 11/05/2020 1.6  1.0 - 2.5 (calc) Final  . Total Bilirubin 11/05/2020 0.5  0.2 - 1.2 mg/dL Final  . Alkaline phosphatase (APISO) 11/05/2020 60  37 - 153 U/L Final  . AST 11/05/2020 18  10 - 35 U/L Final  . ALT 11/05/2020 21  6 - 29 U/L Final  . TSH 11/05/2020 1.45  0.40 - 4.50 mIU/L Final     X-Rays:ECHOCARDIOGRAM COMPLETE  Result Date: 11/29/2020    ECHOCARDIOGRAM REPORT   Patient Name:   KALYNNOX GIRTENAMILTON Date of Exam: 11/29/2020 Medical Rec #:  [id number]          Height:       67.0 in Accession #:    [id number]         Weight:       243.4 lb Date of Birth:  04/1956/05/11           BSA:          2.198 m Patient Age:    [age]             BP:           120/72 mmHg Patient Gender: F                    HR:           63 bpm. [facility]am [facility]  [facility]: 2D Echo, Cardiac Doppler and Color Doppler  Indications:    R55 Near Syncope; Z01.810 Pre-operative cardiovascular                 examination  History:        Patient has no prior history of Echocardiogram examinations.  Signs/Symptoms:Chest Pain; Risk Factors:Diabetes. Asthma.                 Obesity. Palpitations. Hypothyroidism.  Sonographer:    Diamond Nickel RCS Referring Phys: [id number] [place]  1. Left ventricular ejection fraction, by estimation, is 60 to 65%. The left ventricle has normal function. The left ventricle has no regional wall motion abnormalities. Left ventricular diastolic parameters are consistent with Grade I diastolic dysfunction (impaired relaxation).  2. Right ventricular systolic function is normal. The right ventricular size is normal.  3. The mitral valve is normal in structure. Trivial mitral valve regurgitation. No evidence of mitral stenosis.  4. The aortic valve is tricuspid. Aortic valve regurgitation is not visualized. No aortic stenosis is present.  5. The inferior vena cava is normal in size with greater than 50% respiratory variability, suggesting right atrial pressure of 3 mmHg. FINDINGS  Left Ventricle: Left ventricular ejection fraction, by estimation, is 60 to 65%. The left ventricle has normal function. The left ventricle has no regional wall motion abnormalities. The left ventricular internal cavity size was normal in size. There is  no left ventricular hypertrophy. Left ventricular diastolic parameters are consistent with Grade I diastolic dysfunction (impaired relaxation). Right Ventricle: The right ventricular size is normal. Right ventricular systolic function is normal. Left Atrium: Left atrial size was normal in size. Right Atrium: Right atrial size was normal in size. Pericardium: There is no evidence of pericardial effusion. Mitral Valve: The mitral valve is normal in structure. Mild mitral annular calcification. Trivial mitral valve regurgitation. No evidence of  mitral valve stenosis. Tricuspid Valve: The tricuspid valve is normal in structure. Tricuspid valve regurgitation is trivial. No evidence of tricuspid stenosis. Aortic Valve: The aortic valve is tricuspid. Aortic valve regurgitation is not visualized. No aortic stenosis is present. Pulmonic Valve: The pulmonic valve was normal in structure. Pulmonic valve regurgitation is not visualized. No evidence of pulmonic stenosis. Aorta: The aortic root is normal in size and structure. Venous: The inferior vena cava is normal in size with greater than 50% respiratory variability, suggesting right atrial pressure of 3 mmHg. IAS/Shunts: No atrial level shunt detected by color flow Doppler.  LEFT VENTRICLE PLAX 2D LVIDd:         4.90 cm  Diastology LVIDs:         2.60 cm  LV e' medial:    6.24 cm/s LV PW:         1.00 cm  LV E/e' medial:  12.0 LV IVS:        1.20 cm  LV e' lateral:   8.20 cm/s LVOT diam:     1.95 cm  LV E/e' lateral: 9.1 LV SV:         82 LV SV Index:   37 LVOT Area:     2.99 cm  RIGHT VENTRICLE RV Basal diam:  2.60 cm RV S prime:     13.67 cm/s TAPSE (M-mode): 3.0 cm LEFT ATRIUM             Index       RIGHT ATRIUM           Index LA diam:        4.20 cm 1.91 cm/m  RA Area:     21.00 cm LA Vol (A2C):   51.7 ml 23.52 ml/m RA Volume:   58.20 ml  26.48 ml/m LA Vol (A4C):   43.3 ml 19.70 ml/m LA Biplane Vol: 47.2 ml 21.47 ml/m  AORTIC VALVE LVOT Vmax:   121.00 cm/s LVOT Vmean:  73.600 cm/s LVOT VTI:    0.274 m  AORTA Ao Root diam: 2.30 cm MITRAL VALVE MV Area (PHT): 2.62 cm    SHUNTS MV Decel Time: 289 msec    Systemic VTI:  0.27 m MV E velocity: 74.90 cm/s  Systemic Diam: 1.95 cm MV A velocity: 86.80 cm/s MV E/A ratio:  0.86 Kirk Ruths MD Electronically signed by Kirk Ruths MD Signature Date/Time: 11/29/2020/11:54:39 AM    Final     EKG: Orders placed or performed during the hospital encounter of 12/02/20  . EKG 12 lead per protocol  . EKG 12 lead per protocol     Hospital Course: Juliett Eastburn is a 65 y.o. who was admitted to [facility]. They were brought to the operating room on 12/09/2020 and underwent Procedure(s): TOTAL KNEE ARTHROPLASTY.  Patient tolerated the procedure well and was later transferred to the recovery room and then to the orthopaedic floor for postoperative care. They were given PO and IV analgesics for pain control following their surgery. They were given 24 hours of postoperative antibiotics of  Anti-infectives (From admission, onward)   Start     Dose/Rate Route Frequency Ordered Stop   12/09/20 1430  ceFAZolin (ANCEF) IVPB 2g/100 mL premix        2 g 200 mL/hr over 30 Minutes Intravenous Every 6 hours 12/09/20 1125 12/09/20 2020   12/09/20 0715  ceFAZolin (ANCEF) IVPB 2g/100 mL premix        2 g 200 mL/hr over 30 Minutes Intravenous On call to O.R. 12/09/20 7829 12/09/20 0820     and started on DVT prophylaxis in the form of Aspirin.   PT and OT were ordered for total joint protocol. Discharge planning consulted to help with postop disposition and equipment needs.  Patient had a good night on the evening of surgery. They started to get up OOB with therapy on POD #0. Pt was seen during rounds and was ready to go home pending progress with therapy. She worked with therapy on POD #1 and was meeting her goals. Pt was discharged to home later that day in stable condition.  Diet: Regular diet Activity: WBAT Follow-up: in 2 weeks Disposition: Home with OPPT Discharged Condition: stable   Discharge Instructions    Call MD / Call 911   Complete by: As directed    If you experience chest pain or shortness of breath, CALL 911 and be transported to the hospital emergency room.  If you develope a fever above 101 F, pus (white drainage) or increased drainage or redness at the wound, or calf pain, call your surgeon's office.   Change dressing   Complete by: As directed    You may remove the bulky bandage (ACE wrap and gauze) two days after surgery.  You will have an adhesive waterproof bandage underneath. Leave this in place until your first follow-up appointment.   Constipation Prevention   Complete by: As directed    Drink plenty of fluids.  Prune juice may be helpful.  You may use a stool softener, such as Colace (over the counter) 100 mg twice a day.  Use MiraLax (over the counter) for constipation as needed.   Diet - low sodium heart healthy   Complete by: As directed    Do not put a pillow under the knee. Place it under the heel.   Complete by: As directed    Driving restrictions  Complete by: As directed    No driving for two weeks   TED hose   Complete by: As directed    Use stockings (TED hose) for three weeks on both leg(s).  You may remove them at night for sleeping.   Weight bearing as tolerated   Complete by: As directed      Allergies as of 12/10/2020      Reactions   Percocet [oxycodone-acetaminophen] Nausea Only      Medication List    TAKE these medications   albuterol 108 (90 Base) MCG/ACT inhaler Commonly known as: VENTOLIN HFA Inhale 2 puffs into the lungs every 6 (six) hours as needed for wheezing or shortness of breath.   ALPRAZolam 1 MG tablet Commonly known as: XANAX TAKE 1 TABLET(1 MG) BY MOUTH THREE TIMES DAILY What changed: See the new instructions.   aspirin 325 MG EC tablet Take 1 tablet (325 mg total) by mouth 2 (two) times daily for 20 days. Then take one 81 mg aspirin once a day for three weeks. Then discontinue aspirin.   blood glucose meter kit and supplies Kit by Does not apply route daily as needed. Check glucose daily before breakfast and supper.   clobetasol ointment 0.05 % Commonly known as: TEMOVATE Apply 1 application topically 2 (two) times daily. What changed:   when to take this  reasons to take this   clotrimazole-betamethasone cream Commonly known as: Lotrisone Apply 1 application topically 2 (two) times daily.   ezetimibe 10 MG tablet Commonly known as:  Zetia Take 1 tablet (10 mg total) by mouth daily.   fluocinonide-emollient 0.05 % cream Commonly known as: LIDEX-E Apply 1 application topically 2 (two) times daily.   gabapentin 300 MG capsule Commonly known as: NEURONTIN Take a 300 mg capsule three times a day for two weeks following surgery.Then take a 300 mg capsule two times a day for two weeks. Then take a 300 mg capsule once a day for two weeks. Then discontinue.   levothyroxine 150 MCG tablet Commonly known as: SYNTHROID TAKE 1 TABLET(150 MCG) BY MOUTH DAILY What changed: See the new instructions.   methocarbamol 500 MG tablet Commonly known as: ROBAXIN Take 1 tablet (500 mg total) by mouth every 6 (six) hours as needed for muscle spasms. Notes to patient: We talked about taking this medication, the muscle relaxer, after PT with tylenol (650-1094m).    oxyCODONE 5 MG immediate release tablet Commonly known as: Oxy IR/ROXICODONE Take 1-2 tablets (5-10 mg total) by mouth every 6 (six) hours as needed for severe pain.   pantoprazole 40 MG tablet Commonly known as: PROTONIX TAKE 1 TABLET BY MOUTH DAILY What changed: when to take this   senna 8.6 MG tablet Commonly known as: SENOKOT Take 2 tablets by mouth at bedtime.   SM Vitamin D3 100 MCG (4000 UT) Caps Generic drug: Cholecalciferol Take 4,000 Units by mouth daily.   Systane 0.4-0.3 % Gel ophthalmic gel Generic drug: Polyethyl Glycol-Propyl Glycol Place 1 application into both eyes at bedtime as needed (dry eyes).   traMADol 50 MG tablet Commonly known as: ULTRAM Take 1-2 tablets (50-100 mg total) by mouth every 6 (six) hours as needed for moderate pain.            Discharge Care Instructions  (From admission, onward)         Start     Ordered   12/10/20 0000  Weight bearing as tolerated        12/10/20 0741  12/10/20 0000  Change dressing       Comments: You may remove the bulky bandage (ACE wrap and gauze) two days after surgery. You will have an  adhesive waterproof bandage underneath. Leave this in place until your first follow-up appointment.   12/10/20 0741          Follow-up Information    Gaynelle Arabian, MD. Schedule an appointment as soon as possible for a visit on 12/24/2020.   Specialty: Orthopedic Surgery Contact information: [address] [place] [address] [phone]               Signed: Theresa Duty, PA-C Orthopedic Surgery 12/11/2020, 2:00 PM

## 2020-12-12 DIAGNOSIS — M25662 Stiffness of left knee, not elsewhere classified: Secondary | ICD-10-CM | POA: Diagnosis not present

## 2020-12-12 DIAGNOSIS — M25562 Pain in left knee: Secondary | ICD-10-CM | POA: Diagnosis not present

## 2020-12-16 DIAGNOSIS — M25662 Stiffness of left knee, not elsewhere classified: Secondary | ICD-10-CM | POA: Diagnosis not present

## 2020-12-16 DIAGNOSIS — M25562 Pain in left knee: Secondary | ICD-10-CM | POA: Diagnosis not present

## 2020-12-18 DIAGNOSIS — M25662 Stiffness of left knee, not elsewhere classified: Secondary | ICD-10-CM | POA: Diagnosis not present

## 2020-12-18 DIAGNOSIS — M25562 Pain in left knee: Secondary | ICD-10-CM | POA: Diagnosis not present

## 2020-12-19 NOTE — Telephone Encounter (Signed)
Open in error

## 2020-12-25 DIAGNOSIS — M25562 Pain in left knee: Secondary | ICD-10-CM | POA: Diagnosis not present

## 2020-12-27 DIAGNOSIS — M25662 Stiffness of left knee, not elsewhere classified: Secondary | ICD-10-CM | POA: Diagnosis not present

## 2020-12-30 DIAGNOSIS — M25562 Pain in left knee: Secondary | ICD-10-CM | POA: Diagnosis not present

## 2020-12-30 DIAGNOSIS — M25662 Stiffness of left knee, not elsewhere classified: Secondary | ICD-10-CM | POA: Diagnosis not present

## 2021-01-03 DIAGNOSIS — M25562 Pain in left knee: Secondary | ICD-10-CM | POA: Diagnosis not present

## 2021-01-07 DIAGNOSIS — M25662 Stiffness of left knee, not elsewhere classified: Secondary | ICD-10-CM | POA: Diagnosis not present

## 2021-01-09 DIAGNOSIS — M25562 Pain in left knee: Secondary | ICD-10-CM | POA: Diagnosis not present

## 2021-01-09 DIAGNOSIS — M25662 Stiffness of left knee, not elsewhere classified: Secondary | ICD-10-CM | POA: Diagnosis not present

## 2021-01-10 DIAGNOSIS — R55 Syncope and collapse: Secondary | ICD-10-CM

## 2021-01-14 DIAGNOSIS — Z4789 Encounter for other orthopedic aftercare: Secondary | ICD-10-CM | POA: Diagnosis not present

## 2021-01-14 DIAGNOSIS — M25562 Pain in left knee: Secondary | ICD-10-CM | POA: Diagnosis not present

## 2021-01-16 DIAGNOSIS — M25562 Pain in left knee: Secondary | ICD-10-CM | POA: Diagnosis not present

## 2021-01-18 ENCOUNTER — Other Ambulatory Visit: Payer: Self-pay | Admitting: Internal Medicine

## 2021-01-20 DIAGNOSIS — M25562 Pain in left knee: Secondary | ICD-10-CM | POA: Diagnosis not present

## 2021-01-28 ENCOUNTER — Other Ambulatory Visit: Payer: Self-pay

## 2021-01-28 ENCOUNTER — Other Ambulatory Visit: Payer: BC Managed Care – PPO | Admitting: Internal Medicine

## 2021-01-28 DIAGNOSIS — E782 Mixed hyperlipidemia: Secondary | ICD-10-CM

## 2021-01-28 DIAGNOSIS — Z6838 Body mass index (BMI) 38.0-38.9, adult: Secondary | ICD-10-CM

## 2021-01-28 DIAGNOSIS — E039 Hypothyroidism, unspecified: Secondary | ICD-10-CM | POA: Diagnosis not present

## 2021-01-28 DIAGNOSIS — E1169 Type 2 diabetes mellitus with other specified complication: Secondary | ICD-10-CM

## 2021-01-28 DIAGNOSIS — F419 Anxiety disorder, unspecified: Secondary | ICD-10-CM

## 2021-01-28 DIAGNOSIS — E7849 Other hyperlipidemia: Secondary | ICD-10-CM

## 2021-01-28 DIAGNOSIS — E1165 Type 2 diabetes mellitus with hyperglycemia: Secondary | ICD-10-CM | POA: Diagnosis not present

## 2021-01-28 DIAGNOSIS — E8881 Metabolic syndrome: Secondary | ICD-10-CM | POA: Diagnosis not present

## 2021-01-28 DIAGNOSIS — Z6841 Body Mass Index (BMI) 40.0 and over, adult: Secondary | ICD-10-CM

## 2021-01-28 DIAGNOSIS — R7302 Impaired glucose tolerance (oral): Secondary | ICD-10-CM

## 2021-01-28 DIAGNOSIS — E785 Hyperlipidemia, unspecified: Secondary | ICD-10-CM

## 2021-01-28 DIAGNOSIS — R002 Palpitations: Secondary | ICD-10-CM

## 2021-01-29 LAB — COMPLETE METABOLIC PANEL WITH GFR
AG Ratio: 1.8 (calc) (ref 1.0–2.5)
ALT: 19 U/L (ref 6–29)
AST: 16 U/L (ref 10–35)
Albumin: 4.3 g/dL (ref 3.6–5.1)
Alkaline phosphatase (APISO): 61 U/L (ref 37–153)
BUN: 15 mg/dL (ref 7–25)
CO2: 29 mmol/L (ref 20–32)
Calcium: 9.4 mg/dL (ref 8.6–10.4)
Chloride: 103 mmol/L (ref 98–110)
Creat: 0.64 mg/dL (ref 0.50–0.99)
GFR, Est African American: 109 mL/min/{1.73_m2} (ref >=60)
GFR, Est Non African American: 94 mL/min/{1.73_m2} (ref >=60)
Globulin: 2.4 g/dL (calc) (ref 1.9–3.7)
Glucose, Bld: 103 mg/dL — ABNORMAL HIGH (ref 65–99)
Potassium: 4.7 mmol/L (ref 3.5–5.3)
Sodium: 138 mmol/L (ref 135–146)
Total Bilirubin: 0.5 mg/dL (ref 0.2–1.2)
Total Protein: 6.7 g/dL (ref 6.1–8.1)

## 2021-01-29 LAB — CBC WITH DIFFERENTIAL/PLATELET
Absolute Monocytes: 352 cells/uL (ref 200–950)
Basophils Absolute: 31 cells/uL (ref 0–200)
Basophils Relative: 0.6 %
Eosinophils Absolute: 133 cells/uL (ref 15–500)
Eosinophils Relative: 2.6 %
HCT: 40.9 % (ref 35.0–45.0)
Hemoglobin: 13.5 g/dL (ref 11.7–15.5)
Lymphs Abs: 1153 cells/uL (ref 850–3900)
MCH: 27.2 pg (ref 27.0–33.0)
MCHC: 33 g/dL (ref 32.0–36.0)
MCV: 82.5 fL (ref 80.0–100.0)
MPV: 9.2 fL (ref 7.5–12.5)
Monocytes Relative: 6.9 %
Neutro Abs: 3432 cells/uL (ref 1500–7800)
Neutrophils Relative %: 67.3 %
Platelets: 261 10*3/uL (ref 140–400)
RBC: 4.96 10*6/uL (ref 3.80–5.10)
RDW: 14.2 % (ref 11.0–15.0)
Total Lymphocyte: 22.6 %
WBC: 5.1 10*3/uL (ref 3.8–10.8)

## 2021-01-29 LAB — MICROALBUMIN / CREATININE URINE RATIO
Creatinine, Urine: 107 mg/dL (ref 20–275)
Microalb Creat Ratio: 4 mcg/mg creat (ref <30)
Microalb, Ur: 0.4 mg/dL

## 2021-01-29 LAB — LIPID PANEL
Cholesterol: 203 mg/dL — ABNORMAL HIGH (ref <200)
HDL: 55 mg/dL (ref >=50)
LDL Cholesterol (Calc): 127 mg/dL (calc) — ABNORMAL HIGH
Non-HDL Cholesterol (Calc): 148 mg/dL (calc) — ABNORMAL HIGH (ref <130)
Total CHOL/HDL Ratio: 3.7 (calc) (ref <5.0)
Triglycerides: 103 mg/dL (ref <150)

## 2021-01-29 LAB — HEMOGLOBIN A1C
Hgb A1c MFr Bld: 5.7 % of total Hgb — ABNORMAL HIGH (ref <5.7)
Mean Plasma Glucose: 117 mg/dL
eAG (mmol/L): 6.5 mmol/L

## 2021-01-29 LAB — TSH: TSH: 1.2 mIU/L (ref 0.40–4.50)

## 2021-01-30 ENCOUNTER — Other Ambulatory Visit: Payer: Self-pay

## 2021-01-30 ENCOUNTER — Encounter: Payer: Self-pay | Admitting: Internal Medicine

## 2021-01-30 ENCOUNTER — Ambulatory Visit (INDEPENDENT_AMBULATORY_CARE_PROVIDER_SITE_OTHER): Payer: BC Managed Care – PPO | Admitting: Internal Medicine

## 2021-01-30 VITALS — BP 110/60 | HR 64 | Ht 67.0 in | Wt 239.0 lb

## 2021-01-30 DIAGNOSIS — Z8709 Personal history of other diseases of the respiratory system: Secondary | ICD-10-CM

## 2021-01-30 DIAGNOSIS — R002 Palpitations: Secondary | ICD-10-CM | POA: Diagnosis not present

## 2021-01-30 DIAGNOSIS — E8881 Metabolic syndrome: Secondary | ICD-10-CM

## 2021-01-30 DIAGNOSIS — F409 Phobic anxiety disorder, unspecified: Secondary | ICD-10-CM

## 2021-01-30 DIAGNOSIS — E119 Type 2 diabetes mellitus without complications: Secondary | ICD-10-CM | POA: Diagnosis not present

## 2021-01-30 DIAGNOSIS — Z6837 Body mass index (BMI) 37.0-37.9, adult: Secondary | ICD-10-CM

## 2021-01-30 DIAGNOSIS — E782 Mixed hyperlipidemia: Secondary | ICD-10-CM

## 2021-01-30 DIAGNOSIS — Z0000 Encounter for general adult medical examination without abnormal findings: Secondary | ICD-10-CM | POA: Diagnosis not present

## 2021-01-30 DIAGNOSIS — F419 Anxiety disorder, unspecified: Secondary | ICD-10-CM | POA: Diagnosis not present

## 2021-01-30 DIAGNOSIS — E039 Hypothyroidism, unspecified: Secondary | ICD-10-CM

## 2021-01-30 DIAGNOSIS — F5105 Insomnia due to other mental disorder: Secondary | ICD-10-CM

## 2021-01-30 DIAGNOSIS — Z8639 Personal history of other endocrine, nutritional and metabolic disease: Secondary | ICD-10-CM

## 2021-01-30 DIAGNOSIS — Z96651 Presence of right artificial knee joint: Secondary | ICD-10-CM

## 2021-01-30 DIAGNOSIS — E1169 Type 2 diabetes mellitus with other specified complication: Secondary | ICD-10-CM

## 2021-01-30 DIAGNOSIS — E785 Hyperlipidemia, unspecified: Secondary | ICD-10-CM

## 2021-01-30 DIAGNOSIS — F3289 Other specified depressive episodes: Secondary | ICD-10-CM

## 2021-01-30 DIAGNOSIS — R7302 Impaired glucose tolerance (oral): Secondary | ICD-10-CM

## 2021-01-30 LAB — POCT URINALYSIS DIPSTICK
Appearance: NEGATIVE
Bilirubin, UA: NEGATIVE
Blood, UA: NEGATIVE
Glucose, UA: NEGATIVE
Ketones, UA: NEGATIVE
Leukocytes, UA: NEGATIVE
Nitrite, UA: NEGATIVE
Odor: NEGATIVE
Protein, UA: NEGATIVE
Spec Grav, UA: 1.01 (ref 1.010–1.025)
Urobilinogen, UA: 0.2 E.U./dL
pH, UA: 6.5 (ref 5.0–8.0)

## 2021-01-30 NOTE — Progress Notes (Signed)
Subjective:    Patient ID: Mckenzie Hebert, female    DOB: [birth year], 65 y.o.   MRN: [id number]  HPI 65 year old Female for health maintenance exam and evaluation of medical issues.  Had total right knee arthroplasty by Dr. Maureen Ralphs in April 2022.  Has done well.  She has a history of hysteroscopic resection of an endometrial polyp and leiomyoma October 2019 by Dr. Phineas Real.  History of asthma, eczema, hyperplastic colon polyps, insomnia, anxiety, urticaria, history of migraine headaches, hypothyroidism, vitamin D deficiency, GE reflux, obesity, impaired glucose tolerance.  History of Chiari I malformation.  History of functional constipation.  She had H. pylori in 2009.  Angioedema of the lip 2010.  Herpes zoster 2011.  Colonoscopy done in 2017.  Had laparoscopic appendectomy 1995.  History of asthma treated with Advair and Singulair.  History of palpitations and has had cardiac evaluation this past Spring.  Had near syncopal episode in March.  Has seen cardiologist and had normal Myoview study.  Monitor that she wore for 14 days showed frequent PVCs, frequent PACs and 31 episodes of SVT the longest lasting 14 seconds.  Recent Echocardiogram showed grade 1 diastolic dysfunction.  She has mild elevation of LDL at 127.  Her blood pressure is stable.  Cardiologist not sure what cause of syncope which could have been but she was started on metoprolol 25 mg twice daily for dysrhythmia.  She is on Zetia 10 mg daily.  Sleep study recommended for daytime somnolence.  She is a widow.  Husband died of heart problems.  No children.  She smoked until the age of 89 and then quit.  No alcohol consumption.  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  Resides alone.  Lives in the [place] area.  Family history: Parents are deceased.  Mother died at age 7 with history of hypertension, diabetes, coronary artery disease, MI status post CABG.  Father died in his 9s with  history of lung cancer.  2 sisters.  1 sister with history of diabetes and hypertension.  2 brothers.      Review of Systems     Objective:   Physical Exam Blood pressure 110/60 pulse 64 pulse oximetry 98% weight 239 pounds BMI 37.43  Skin: Warm and dry.  Nodes none.  TMs are clear.  Pharynx is clear.  Neck supple without JVD thyromegaly or carotid bruits.  No thyromegaly.  Chest is clear to auscultation.  Cardiac exam reveals extrasystoles at times but not very frequent today.  No murmur appreciated.  Abdomen obese soft nondistended without hepatosplenomegaly masses or tenderness.  Pap was taken in 2021.  Bimanual exam is normal.  Mood affect and judgment are normal.  No gross focal deficits on brief neurological exam.       Assessment & Plan:  Cardiac dysrhythmia followed by Dr. Gardiner Rhyme treated with metoprolol  Obesity-she previously was going to [facility].  Has not been since October 2021 but did lose weight . Last year weight 281 pounds and BMI was 45.35  GE reflux treated with generic Protonix  Had colonoscopy 2017  History of asthma treated with inhalers-mainly albuterol  History of migraine headaches  History of constipation  Impaired glucose tolerance-stable  History of eczema treated with Temovate or Lidex cream   History of vitamin D deficiency-level was not checked due to expense.  Patient will take over-the-counter vitamin D supplement  Hypothyroidism treated with thyroid replacement medication and TSH is stable  Anxiety and  insomnia treated with Xanax  Mixed hyperlipidemia treated with Crestor-dose is currently 10 mg daily  Plan: She will continue current medications and follow-up here in 6 months.

## 2021-01-30 NOTE — Patient Instructions (Addendum)
It was a pleasure to see you today. Continue same meds and follow up in 6 months. Follow up with Cardiologist regarding heart monitor results.  I am pleased with your weight loss efforts.

## 2021-02-11 ENCOUNTER — Other Ambulatory Visit: Payer: BC Managed Care – PPO | Admitting: Internal Medicine

## 2021-02-14 ENCOUNTER — Encounter: Payer: BC Managed Care – PPO | Admitting: Internal Medicine

## 2021-02-16 IMAGING — MG DIGITAL SCREENING BILAT W/ TOMO W/ CAD
8 series · 8 of 24 positions shown · non-contrast
Comparison: Previous exam(s).

CLINICAL DATA: Screening.

EXAM:
DIGITAL SCREENING BILATERAL MAMMOGRAM WITH TOMO AND CAD

[R CC synth-2D]
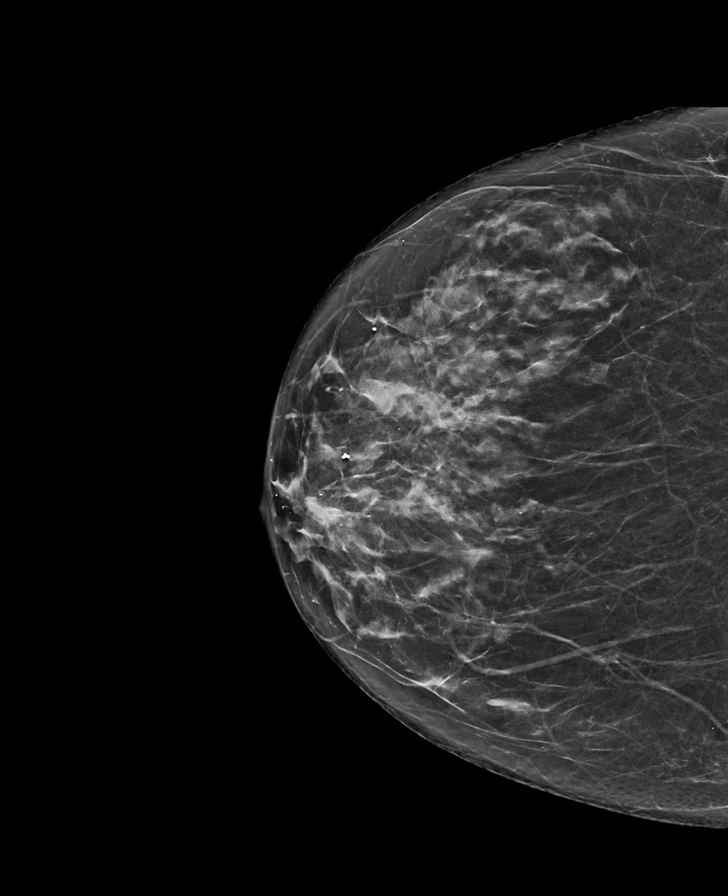

[L CC synth-2D]
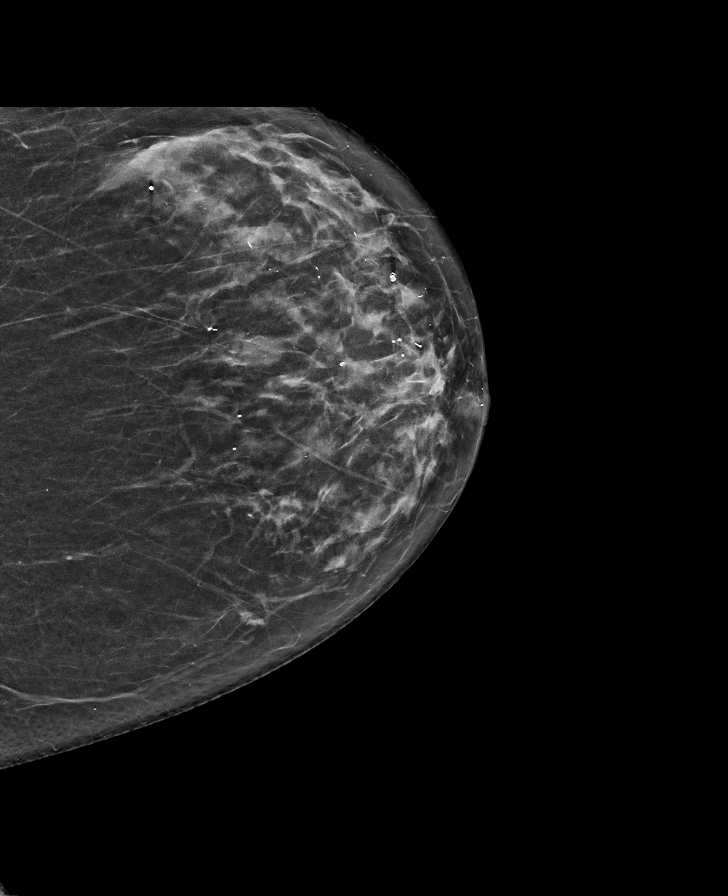

[L MLO synth-2D]
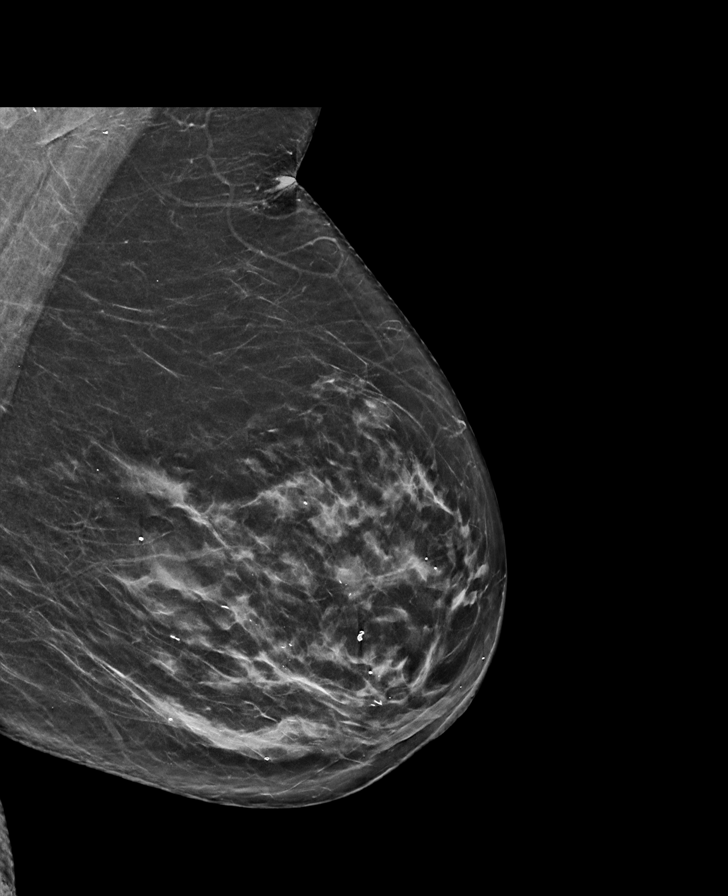

[R MLO synth-2D]
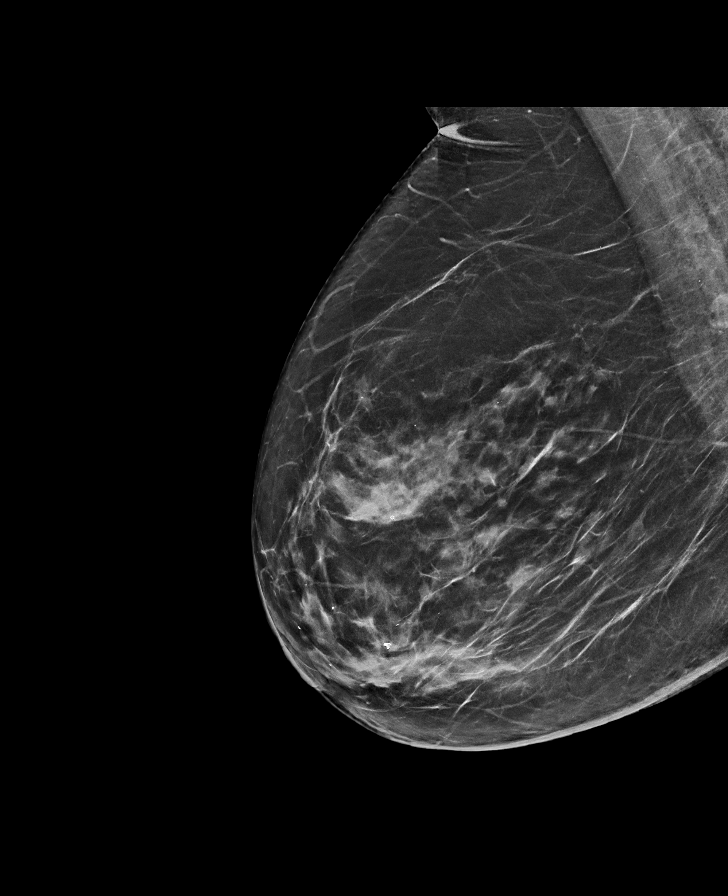

[L CC tomo · tomo slice 32/63.0]
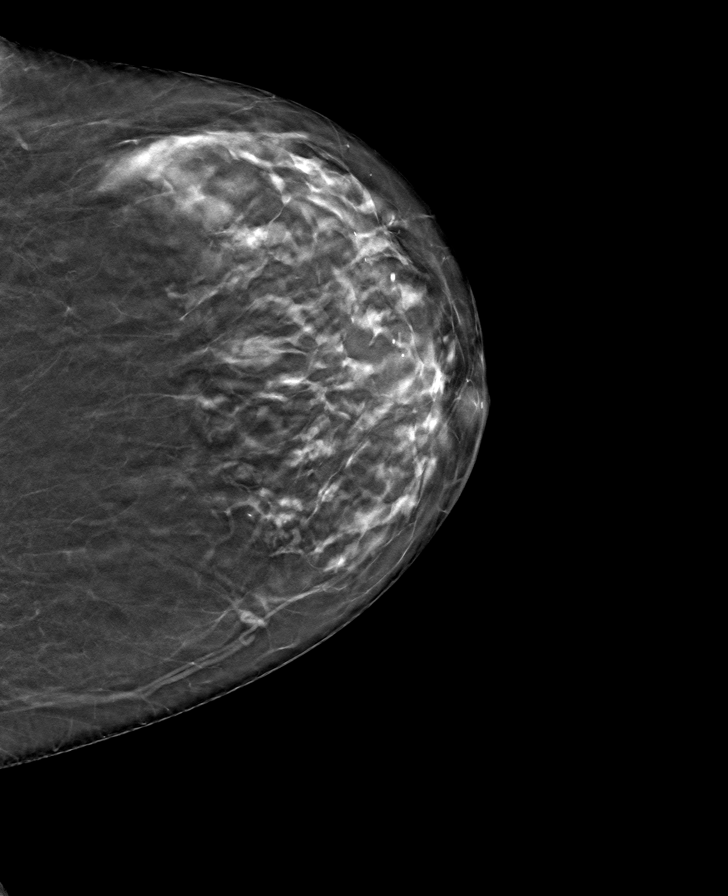

[L MLO tomo · tomo slice 35/69.0]
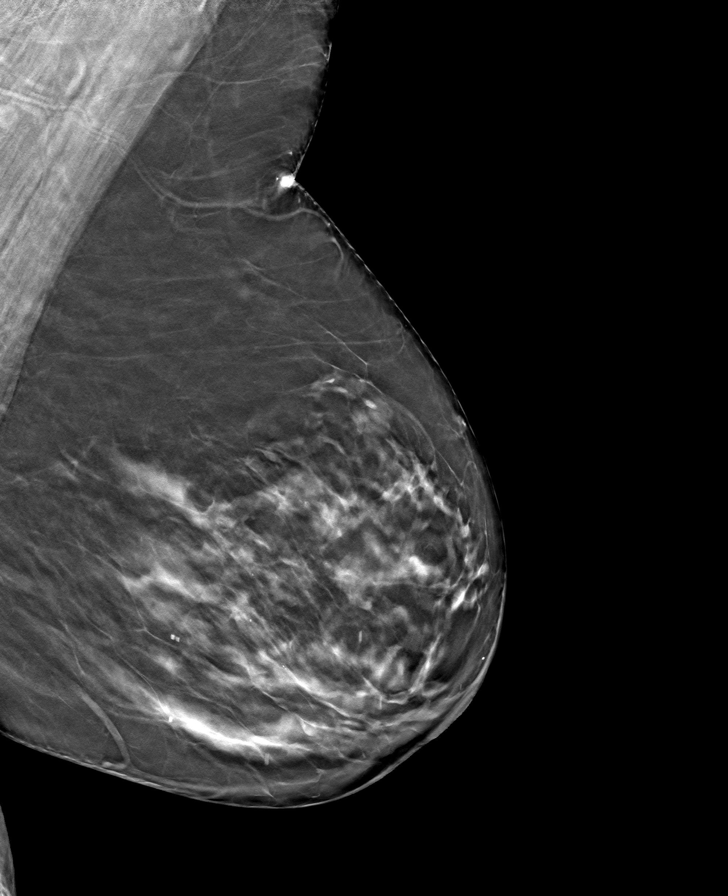

[R CC tomo · tomo slice 33/65.0]
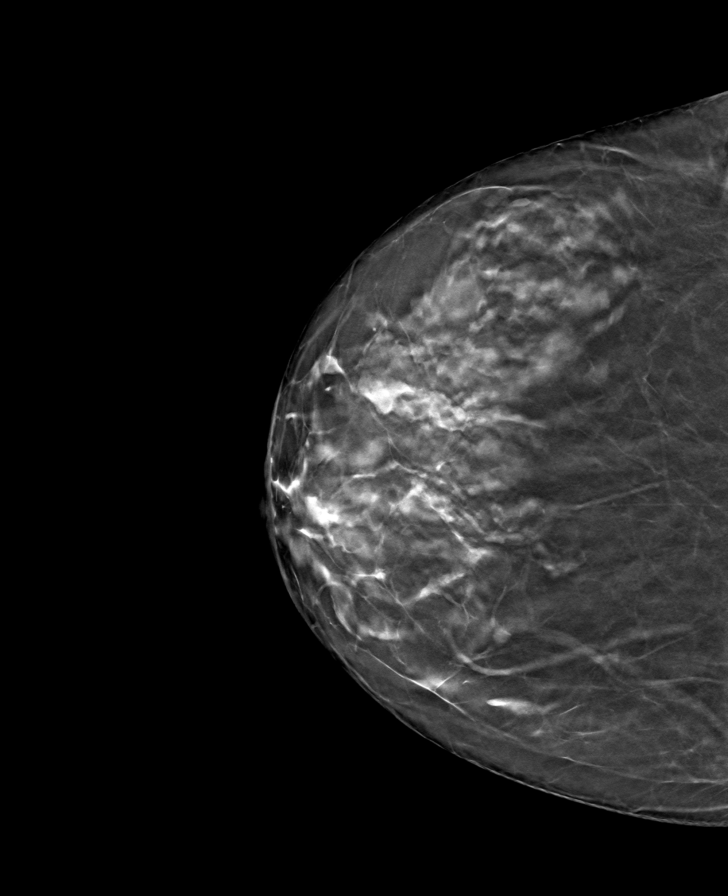

[R MLO tomo · tomo slice 39/78.0]
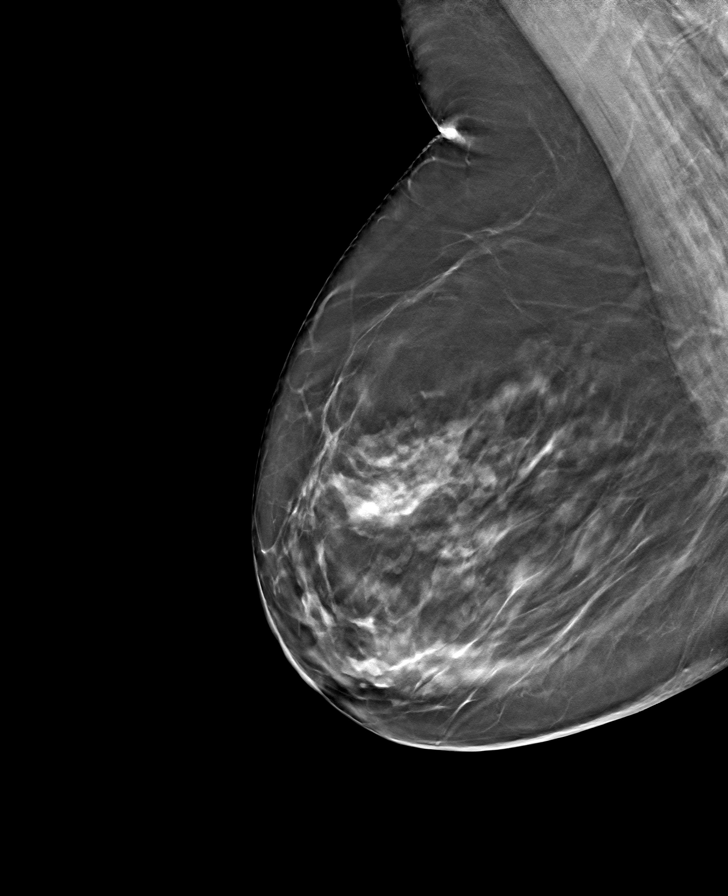

[8 of 24 positions shown; findings below may reference images not displayed]

ACR Breast Density Category c: The breast tissue is heterogeneously
dense, which may obscure small masses.
FINDINGS: There are no findings suspicious for malignancy. Images were
processed with CAD.
IMPRESSION: No mammographic evidence of malignancy. A result letter of this
screening mammogram will be mailed directly to the patient.

RECOMMENDATION:
Screening mammogram in one year. (Code:[id number])

BI-RADS CATEGORY  1: Negative.

## 2021-02-16 NOTE — Progress Notes (Signed)
Cardiology Office Note:    Date:  02/18/2021   ID:  Mckenzie Hebert, DOB 1956/02/11, MRN [id number]  PCP:  Mckenzie Showers, MD  Cardiologist:  None  Electrophysiologist:  None   Referring MD: Mckenzie Showers, MD   No chief complaint on file.   History of Present Illness:    Mckenzie Hebert is a 65 y.o. female with a hx of hypothyroidism, prediabetes, frequent PACs/PVCs who presents for follow-up.  She was referred by Dr. Renold Hebert for evaluation of near syncopal episode, initially seen on 11/15/2020.  She works as a Sports coach and reports that on 10/10/2020 she was sitting down and felt lightheaded.  She denies any chest pain, dyspnea, palpitations.  States that she felt she was going to pass out, but turned her fan on and started taking deep breaths and symptoms improved.  She reports she lost 50 pounds from June to November last year.  Reports occasional chest pain, which she states occurs a couple times per year and describes as dull aching pain in the center of her chest that can last up to 30 minutes.  She denies any exertional chest pain.  States that she can walk up a flight of stairs without stopping, denies any chest pain or shortness of breath with this.  She smoked in her teens and 58s.  Family history includes mother had CABG and CHF, died during a nuclear stress test.  Lexiscan Myoview on 10/11/2015 showed normal perfusion, EF 64%.  Echocardiogram on 11/29/2020 showed normal biventricular function, no significant valvular disease.  Zio patch x14 days on 01/30/2021 showed frequent PVCs (10% of beats), frequent PACs (10%), 31 episodes of SVT with longest lasting 14 seconds.  Since last clinic visit, she reports she has been doing okay.  She underwent knee surgery, has been recovering well.  She denies any chest pain.  Reports intermittent dyspnea which improved with inhaler use, which she attributes to her asthma.  She denies any lightheadedness or syncope.  Reports rare palpitations.     Past Medical History:  Diagnosis Date   AC (acromioclavicular) joint bone spurs 2008   right foot   Anxiety    Asthma    Back pain    Back pain    Bilateral swelling of feet    Constipation    Dyspnea    Dysrhythmia 2019   PVC   Endometrial polyp    Endometrial polyp    Heartburn    History of colon polyps    Hypothyroidism    Joint pain    Joint pain    Migraines    Dr Mckenzie Hebert   Osteoarthritis    Palpitations    cardiologist-  dr hochrein-- normal myoview 2017, holter 2015 mild ectopy   PONV (postoperative nausea and vomiting)    Pre-diabetes 2019   SOB (shortness of breath)    Thyroid disease    Uterine fibroid    Vitamin D deficiency     Past Surgical History:  Procedure Laterality Date   APPENDECTOMY     CARDIOVASCULAR STRESS TEST  10-11-2015   dr hochrein   normal nuclear study w/ no ischemia/  normal LV function and wall motion, nuclear stress ef 64%   D & C HYSTERSCOPY W/ RESECTION POLYP  01-16-2010   dr gottsegen  @ [facility] N/A 06/07/2018   Procedure: [place];  Surgeon: Mckenzie Auerbach, MD;  Location: [facility];  Service:  Gynecology;  Laterality: N/A;  request to follow first case in [place] block time  requests one hour   FOOT SURGERY  2002, 2008   bi-lat , bone spurs, achilles tendon work   HAMMER TOE SURGERY  11/2018   RESECTION TUMOR [facility]   benign   TONSILLECTOMY     TOTAL KNEE ARTHROPLASTY Left 12/09/2020   Procedure: TOTAL KNEE ARTHROPLASTY;  Surgeon: Mckenzie Arabian, MD;  Location: [facility];  Service: Orthopedics;  Laterality: Left;  76mn    Current Medications: Current Meds  Medication Sig   albuterol (VENTOLIN HFA) 108 (90 Base) MCG/ACT inhaler Inhale 2 puffs into the lungs every 6 (six) hours as needed for wheezing or shortness of breath.   ALPRAZolam (XANAX) 1 MG tablet TAKE 1 TABLET(1 MG) BY MOUTH THREE TIMES  DAILY (Patient taking differently: Take 0.5-1 mg by mouth 3 (three) times daily as needed for anxiety.)   blood glucose meter kit and supplies KIT by Does not apply route daily as needed. Check glucose daily before breakfast and supper.   Cholecalciferol (SM VITAMIN D3) 100 MCG (4000 UT) CAPS Take 4,000 Units by mouth daily.   clobetasol ointment (TEMOVATE) 00.30% Apply 1 application topically 2 (two) times daily. (Patient taking differently: Apply 1 application topically daily as needed (rash).)   clotrimazole-betamethasone (LOTRISONE) cream Apply 1 application topically 2 (two) times daily.   ezetimibe (ZETIA) 10 MG tablet Take 1 tablet (10 mg total) by mouth daily.   fluocinonide-emollient (LIDEX-E) 0.05 % cream Apply 1 application topically 2 (two) times daily.   levothyroxine (SYNTHROID) 150 MCG tablet TAKE 1 TABLET(150 MCG) BY MOUTH DAILY   metoprolol tartrate (LOPRESSOR) 25 MG tablet Take 1 tablet (25 mg total) by mouth 2 (two) times daily.   pantoprazole (PROTONIX) 40 MG tablet TAKE 1 TABLET BY MOUTH DAILY (Patient taking differently: Take 40 mg by mouth at bedtime.)   Polyethyl Glycol-Propyl Glycol (SYSTANE) 0.4-0.3 % GEL ophthalmic gel Place 1 application into both eyes at bedtime as needed (dry eyes).   senna (SENOKOT) 8.6 MG tablet Take 2 tablets by mouth at bedtime.     Allergies:   Percocet [oxycodone-acetaminophen]   Social History   Socioeconomic History   Marital status: Widowed    Spouse name: Not on file   Number of children: Not on file   Years of education: Not on file   Highest education level: Not on file  Occupational History   Occupation: Histology    Employer: [employer]  Tobacco Use   Smoking status: Former    Pack years: 0.00    Types: Cigarettes    Quit date: 1973    Years since quitting: 49.4   Smokeless tobacco: Never  Vaping Use   Vaping Use: Never used  Substance and Sexual Activity   Alcohol use: No    Alcohol/week: 0.0 standard drinks    Drug use: No   Sexual activity: Not Currently    Comment: INTERCOURSE AGE 9, SEXUAL PARTNERS LESS THAN 5  Other Topics Concern   Not on file  Social History Narrative   Lives alone.     Social Determinants of Health   Financial Resource Strain: Not on file  Food Insecurity: Not on file  Transportation Needs: Not on file  Physical Activity: Not on file  Stress: Not on file  Social Connections: Not on file     Family History: The patient'sfamily history includes Alcohol abuse in her father; Autoimmune disease in her sister;  Breast cancer in her paternal aunt; Cancer in her father; Depression in her father; Diabetes in her mother and sister; Heart disease (age of onset: 54) in her mother; Hyperlipidemia in her mother; Hypertension in her mother, sister, and sister; Leukemia in her paternal grandmother; Mental retardation in an other family member; Stroke in her mother; Thyroid disease in her sister.  ROS:   Please see the history of present illness.    All other systems reviewed and are negative.  EKGs/Labs/Other Studies Reviewed:    The following studies were reviewed today:   EKG:  EKG is  ordered today.  The ekg ordered today demonstrates normal sinus rhythm, PVCs/PACs, rate 76, incomplete right bundle branch block  Recent Labs: 01/28/2021: ALT 19; BUN 15; Creat 0.64; Hemoglobin 13.5; Platelets 261; Potassium 4.7; Sodium 138; TSH 1.20  Recent Lipid Panel    Component Value Date/Time   CHOL 203 (H) 01/28/2021 0919   TRIG 103 01/28/2021 0919   HDL 55 01/28/2021 0919   CHOLHDL 3.7 01/28/2021 0919   VLDL 17 05/12/2016 0933   LDLCALC 127 (H) 01/28/2021 0919    Physical Exam:    VS:  BP 126/84   Pulse 76   Ht _0  (1.702 m)   Wt 242 lb 6.4 oz (110 kg)   LMP 03/18/2008 (LMP Unknown)   SpO2 94%   BMI 37.97 kg/m     Wt Readings from Last 3 Encounters:  02/18/21 242 lb 6.4 oz (110 kg)  01/30/21 239 lb (108.4 kg)  12/09/20 240 lb (108.9 kg)     GEN: Well  nourished, well developed in no acute distress HEENT: Normal NECK: No JVD; No carotid bruits CARDIAC: Irregular, normal rate, no murmurs RESPIRATORY:  Clear to auscultation without rales, wheezing or rhonchi  ABDOMEN: Soft, non-tender, non-distended MUSCULOSKELETAL:  No edema; No deformity  SKIN: Warm and dry NEUROLOGIC:  Alert and oriented x 3 PSYCHIATRIC:  Normal affect   ASSESSMENT:    1. PVC's (premature ventricular contractions)   2. Premature atrial contractions   3. Hyperlipidemia, unspecified hyperlipidemia type   4. Daytime somnolence   5. Near syncope     PLAN:    Near syncope: Unclear cause.  Echocardiogram on 11/29/2020 showed normal biventricular function, no significant valvular disease.  Zio patch x14 days on 01/30/2021 showed frequent PVCs (10% of beats), frequent PACs (10%), 31 episodes of SVT with longest lasting 14 seconds.  Start metoprolol 25 mg twice daily  Frequent PACs/PVCs: Zio patch x14 days on 01/30/2021 showed frequent PVCs (10% of beats), frequent PACs (10%), 31 episodes of SVT with longest lasting 14 seconds.   - Start metoprolol 25 mg twice daily  Hyperlipidemia: On Zetia 10 mg daily.  LDL 127 on 01/28/21.  10-year ASCVD risk score 5%.  Recommend calcium score for further risk ratification.  Daytime somnolence: Will check sleep study  RTC in 3 months   Medication Adjustments/Labs and Tests Ordered: Current medicines are reviewed at length with the patient today.  Concerns regarding medicines are outlined above.  Orders Placed This Encounter  Procedures   CT CARDIAC SCORING (SELF PAY ONLY)   EKG 12-Lead   Home sleep test    Meds ordered this encounter  Medications   metoprolol tartrate (LOPRESSOR) 25 MG tablet    Sig: Take 1 tablet (25 mg total) by mouth 2 (two) times daily.    Dispense:  180 tablet    Refill:  3     There are no Patient Instructions on file  for this visit.   Signed, Donato Heinz, MD  02/18/2021 11:02 AM     [place]

## 2021-02-18 ENCOUNTER — Ambulatory Visit: Payer: BC Managed Care – PPO | Admitting: Cardiology

## 2021-02-18 ENCOUNTER — Other Ambulatory Visit: Payer: Self-pay

## 2021-02-18 VITALS — BP 126/84 | HR 76 | Ht 67.0 in | Wt 242.4 lb

## 2021-02-18 DIAGNOSIS — I491 Atrial premature depolarization: Secondary | ICD-10-CM

## 2021-02-18 DIAGNOSIS — R400 Somnolence: Secondary | ICD-10-CM

## 2021-02-18 DIAGNOSIS — R55 Syncope and collapse: Secondary | ICD-10-CM

## 2021-02-18 DIAGNOSIS — E785 Hyperlipidemia, unspecified: Secondary | ICD-10-CM | POA: Diagnosis not present

## 2021-02-18 DIAGNOSIS — I493 Ventricular premature depolarization: Secondary | ICD-10-CM

## 2021-02-18 MED ORDER — METOPROLOL TARTRATE 25 MG PO TABS: 25.0000 mg | ORAL_TABLET | Freq: Two times a day (BID) | ORAL | 3 refills | Status: DC

## 2021-02-18 NOTE — Patient Instructions (Signed)
Medication Instructions:  START metoprolol tartrate (Lopressor) 25 mg two times daily  *If you need a refill on your cardiac medications before your next appointment, please call your pharmacy*  Testing/Procedures: Your physician has recommended that you have a sleep study (home sleep test). This test records several body functions during sleep, including: brain activity, eye movement, oxygen and carbon dioxide blood levels, heart rate and rhythm, breathing rate and rhythm, the flow of air through your mouth and nose, snoring, body muscle movements, and chest and belly movement.  CT coronary calcium score. This test is done at [address]. This is $99 out of pocket.   Coronary CalciumScan A coronary calcium scan is an imaging test used to look for deposits of calcium and other fatty materials (plaques) in the inner lining of the blood vessels of the heart (coronary arteries). These deposits of calcium and plaques can partly clog and narrow the coronary arteries without producing any symptoms or warning signs. This puts a person at risk for a heart attack. This test can detect these deposits before symptoms develop. Tell a health care provider about: Any allergies you have. All medicines you are taking, including vitamins, herbs, eye drops, creams, and over-the-counter medicines. Any problems you or family members have had with anesthetic medicines. Any blood disorders you have. Any surgeries you have had. Any medical conditions you have. Whether you are pregnant or may be pregnant. What are the risks? Generally, this is a safe procedure. However, problems may occur, including: Harm to a pregnant woman and her unborn baby. This test involves the use of radiation. Radiation exposure can be dangerous to a pregnant woman and her unborn baby. If you are pregnant, you generally should not have this procedure done. Slight increase in the risk of cancer. This is because of the  radiation involved in the test. What happens before the procedure? No preparation is needed for this procedure. What happens during the procedure? You will undress and remove any jewelry around your neck or chest. You will put on a hospital gown. Sticky electrodes will be placed on your chest. The electrodes will be connected to an electrocardiogram (ECG) machine to record a tracing of the electrical activity of your heart. A CT scanner will take pictures of your heart. During this time, you will be asked to lie still and hold your breath for 2-3 seconds while a picture of your heart is being taken. The procedure may vary among health care providers and hospitals. What happens after the procedure? You can get dressed. You can return to your normal activities. It is up to you to get the results of your test. Ask your health care provider, or the department that is doing the test, when your results will be ready. Summary A coronary calcium scan is an imaging test used to look for deposits of calcium and other fatty materials (plaques) in the inner lining of the blood vessels of the heart (coronary arteries). Generally, this is a safe procedure. Tell your health care provider if you are pregnant or may be pregnant. No preparation is needed for this procedure. A CT scanner will take pictures of your heart. You can return to your normal activities after the scan is done. This information is not intended to replace advice given to you by your health care provider. Make sure you discuss any questions you have with your health care provider. Document Released: 02/20/2008 Document Revised: 07/13/2016 Document Reviewed: 07/13/2016 Elsevier Interactive Patient  Education  2017 [place]: At [facility], you and your health needs are our priority.  As part of our continuing mission to provide you with exceptional heart care, we have created designated Provider Care Teams.  These Care  Teams include your primary Cardiologist (physician) and Advanced Practice Providers (APPs -  Physician Assistants and Nurse Practitioners) who all work together to provide you with the care you need, when you need it.  We recommend signing up for the patient portal called "MyChart".  Sign up information is provided on this After Visit Summary.  MyChart is used to connect with patients for Virtual Visits (Telemedicine).  Patients are able to view lab/test results, encounter notes, upcoming appointments, etc.  Non-urgent messages can be sent to your provider as well.   To learn more about what you can do with MyChart, go to [email or web address].    Your next appointment:   3 month(s)  The format for your next appointment:   In Person  Provider:   Oswaldo Milian, MD

## 2021-02-26 ENCOUNTER — Telehealth: Payer: Self-pay | Admitting: Cardiology

## 2021-02-26 NOTE — Telephone Encounter (Signed)
Returned call to patient, patient reports she started metoprolol 1 week ago (started at [facility] 6/14 with Dr. Gardiner Rhyme).  She states this is her last week out of work and she has noticed increased fatigue.  She states she gets up at 430 AM every morning and has been taking a 2 hour nap in the evenings which is unusual for her.   She believes this may be a side effect and is worried about this effecting her at work.     She does not check her BP at home.    She would like to try this for another week and call back to update [place].  Advised would make Dr. Gardiner Rhyme aware and await for call back next week for update.    Patient also inquired about the sleep study. Advised this must be approved by insurance prior to scheduling.   Advised once approved, sleep coordinator will call to arrange.   Advised if she does not hear from sleep coordinator within 2 weeks, please call to follow up.

## 2021-02-26 NOTE — Telephone Encounter (Signed)
Pt c/o medication issue:  1. Name of Medication: metoprolol tartrate (LOPRESSOR) 25 MG tablet  2. How are you currently taking this medication (dosage and times per day)? 1 tablet twice a day  3. Are you having a reaction (difficulty breathing--STAT)? no  4. What is your medication issue? Patient states she has been on the medication for a week and was told to let the office know if it made her tired. She states it has been making her more tired in the afternoon and she has been sleeping for about 2 hours every afternoon. She states she is out on FMLA, but next weeks she will be back and have to wake up at 4:30 am. She states she also has not heard yet about the sleep study.

## 2021-02-27 ENCOUNTER — Inpatient Hospital Stay: Admission: RE | Admit: 2021-02-27 | Payer: Self-pay | Source: Ambulatory Visit

## 2021-02-27 ENCOUNTER — Other Ambulatory Visit: Payer: Self-pay

## 2021-02-27 ENCOUNTER — Ambulatory Visit (INDEPENDENT_AMBULATORY_CARE_PROVIDER_SITE_OTHER)
Admission: RE | Admit: 2021-02-27 | Discharge: 2021-02-27 | Disposition: A | Discharge status: Home / Self Care | Payer: Self-pay | Source: Ambulatory Visit | Attending: Cardiology | Admitting: Cardiology

## 2021-02-27 DIAGNOSIS — R079 Chest pain, unspecified: Secondary | ICD-10-CM

## 2021-02-28 ENCOUNTER — Telehealth: Payer: Self-pay | Admitting: *Deleted

## 2021-02-28 NOTE — Telephone Encounter (Signed)
Prior Authorization for [employer] sent to [place] via [employer] [id number].

## 2021-02-28 NOTE — Telephone Encounter (Signed)
-----   Message from Silverio Lay, RN sent at 02/18/2021  1:41 PM EDT ----- Regarding: HST HST ordered per Dr. Gardiner Rhyme  Thanks!

## 2021-03-11 ENCOUNTER — Telehealth: Payer: Self-pay | Admitting: Cardiology

## 2021-03-11 MED ORDER — ROSUVASTATIN CALCIUM 10 MG PO TABS: 10.0000 mg | ORAL_TABLET | Freq: Every day | ORAL | 3 refills | Status: DC

## 2021-03-11 NOTE — Telephone Encounter (Signed)
LVM and sent my chart message with results and recommendations. Rosuvastatin, 10mg  tablet, sent in to pts preferred pharmacy.

## 2021-03-11 NOTE — Telephone Encounter (Signed)
Follow Up:    Pt is returning Hayley's call from Friday(03-07-21), concerning her CT results.

## 2021-03-17 MED ORDER — ROSUVASTATIN CALCIUM 10 MG PO TABS: 10.0000 mg | ORAL_TABLET | Freq: Every day | ORAL | 3 refills | Status: DC

## 2021-03-17 NOTE — Addendum Note (Signed)
Addended by: Patria Mane A on: 03/17/2021 04:40 PM   Modules accepted: Orders

## 2021-03-17 NOTE — Telephone Encounter (Signed)
Returned call to patient to discuss-patient reports being on rosuvastatin a few years ago by her PCP and was unable to tolerate due to knee pain.   At that time, PCP started her on Zetia instead and has been taking since.  She is willing to retrial to see if she tolerates this.   Advised if she experienced symptoms after 1-2 weeks to stop medication and call to make [place] aware so we can discuss alternatives.  Patient verbalized understanding.

## 2021-03-17 NOTE — Telephone Encounter (Signed)
Pt is returning call from 03/15/21 regarding the new medicine. Please advise pt further

## 2021-03-26 ENCOUNTER — Other Ambulatory Visit: Payer: Self-pay | Admitting: Internal Medicine

## 2021-04-02 NOTE — Telephone Encounter (Signed)
Had to redo PA via web portal.  Per web portal no PA is required. Reference # [id number]. Appointment scheduled and patient left message of details.

## 2021-04-08 NOTE — Addendum Note (Signed)
Addended by: Elby Showers on: 04/08/2021 09:58 AM   Modules accepted: Level of Service

## 2021-04-10 ENCOUNTER — Other Ambulatory Visit: Payer: Self-pay | Admitting: Internal Medicine

## 2021-04-10 DIAGNOSIS — Z1231 Encounter for screening mammogram for malignant neoplasm of breast: Secondary | ICD-10-CM

## 2021-04-16 ENCOUNTER — Other Ambulatory Visit: Payer: Self-pay | Admitting: Internal Medicine

## 2021-04-24 DIAGNOSIS — K5900 Constipation, unspecified: Secondary | ICD-10-CM | POA: Diagnosis not present

## 2021-04-24 DIAGNOSIS — K219 Gastro-esophageal reflux disease without esophagitis: Secondary | ICD-10-CM | POA: Diagnosis not present

## 2021-04-24 DIAGNOSIS — K5730 Diverticulosis of large intestine without perforation or abscess without bleeding: Secondary | ICD-10-CM | POA: Diagnosis not present

## 2021-04-24 DIAGNOSIS — Z1211 Encounter for screening for malignant neoplasm of colon: Secondary | ICD-10-CM | POA: Diagnosis not present

## 2021-05-05 ENCOUNTER — Ambulatory Visit (HOSPITAL_BASED_OUTPATIENT_CLINIC_OR_DEPARTMENT_OTHER): Payer: BC Managed Care – PPO | Admitting: Cardiovascular Disease

## 2021-05-05 ENCOUNTER — Other Ambulatory Visit: Payer: Self-pay

## 2021-05-05 DIAGNOSIS — R400 Somnolence: Secondary | ICD-10-CM

## 2021-05-05 DIAGNOSIS — H04123 Dry eye syndrome of bilateral lacrimal glands: Secondary | ICD-10-CM | POA: Diagnosis not present

## 2021-05-07 ENCOUNTER — Telehealth: Payer: Self-pay | Admitting: Cardiology

## 2021-05-07 NOTE — Telephone Encounter (Signed)
Pt c/o medication issue:  1. Name of Medication: Rosuvastatin, Metoprolol   2. How are you currently taking this medication (dosage and times per day)? Rosuvastatin once a day and the Metoprolol BIB  3. Are you having a reaction (difficulty breathing--STAT)? No   4. What is your medication issue? Anxiety sick really nervos and forget full

## 2021-05-07 NOTE — Telephone Encounter (Signed)
Spoke to patient she states since starting medication  1 to 2 months ago. She states  she has been feeling anxious and memory issues Patient says she limited caffeine  but drink little around noon  because she fells fatigue    Symptoms  of head rush and cold chills.  She get up about 3 am in the morning  to start her day.She try to sleep about 8 pm . She is in the process of having sleep studies. Patient states she was just concerned  due to being depressed after husband die,but able to function   Rn informed patient will defer to Dr Gardiner Rhyme

## 2021-05-08 NOTE — Telephone Encounter (Signed)
We can try decreasing the metoprolol dose to see if it helps, can change from lopressor 25 mg BID to toprol XL 25 mg daily

## 2021-05-09 ENCOUNTER — Ambulatory Visit (HOSPITAL_BASED_OUTPATIENT_CLINIC_OR_DEPARTMENT_OTHER): Payer: BC Managed Care – PPO | Attending: Cardiology | Admitting: Cardiovascular Disease

## 2021-05-09 DIAGNOSIS — G478 Other sleep disorders: Secondary | ICD-10-CM

## 2021-05-09 DIAGNOSIS — R001 Bradycardia, unspecified: Secondary | ICD-10-CM | POA: Insufficient documentation

## 2021-05-09 DIAGNOSIS — G4730 Sleep apnea, unspecified: Secondary | ICD-10-CM | POA: Diagnosis not present

## 2021-05-09 DIAGNOSIS — R0683 Snoring: Secondary | ICD-10-CM | POA: Insufficient documentation

## 2021-05-09 DIAGNOSIS — G4733 Obstructive sleep apnea (adult) (pediatric): Secondary | ICD-10-CM | POA: Diagnosis not present

## 2021-05-09 NOTE — Telephone Encounter (Signed)
Attempted to call patient, left message for patient to call back to office.   

## 2021-05-10 ENCOUNTER — Other Ambulatory Visit: Payer: Self-pay | Admitting: Internal Medicine

## 2021-05-13 ENCOUNTER — Telehealth: Payer: Self-pay | Admitting: Internal Medicine

## 2021-05-13 MED ORDER — METOPROLOL SUCCINATE ER 25 MG PO TB24: 25.0000 mg | ORAL_TABLET | Freq: Every day | ORAL | 5 refills | Status: DC

## 2021-05-13 NOTE — Telephone Encounter (Signed)
Lm to call back ./cy 

## 2021-05-13 NOTE — Telephone Encounter (Signed)
Patient was returning call to the office from Friday. Please leave detailed message if the patient is not available

## 2021-05-13 NOTE — Telephone Encounter (Signed)
Patient called w/MD instructions for med change. Reviewed in detail that this is a different formulation of metoprolol and she will STOP lopressor and started one daily meto succinate '25mg'$ . Rx(s) sent to pharmacy electronically.

## 2021-05-13 NOTE — Telephone Encounter (Signed)
Pt unable to do that time, set her up for Thursday

## 2021-05-13 NOTE — Telephone Encounter (Signed)
Pt calling and said she wanted to see if she can be seen this afternoon, pt said she has a sinus headache for about a week now and wanted to get it checked out. Please advise [phone]

## 2021-05-13 NOTE — Telephone Encounter (Signed)
   Pt is returning call, she said to call her on her home phone and cell#, she is off now and can definitely answer her phones

## 2021-05-15 ENCOUNTER — Encounter: Payer: Self-pay | Admitting: Internal Medicine

## 2021-05-15 ENCOUNTER — Other Ambulatory Visit: Payer: Self-pay

## 2021-05-15 ENCOUNTER — Ambulatory Visit (INDEPENDENT_AMBULATORY_CARE_PROVIDER_SITE_OTHER): Payer: BC Managed Care – PPO | Admitting: Internal Medicine

## 2021-05-15 VITALS — BP 136/80 | HR 69 | Ht 67.0 in | Wt 244.0 lb

## 2021-05-15 DIAGNOSIS — Z7729 Contact with and (suspected ) exposure to other hazardous substances: Secondary | ICD-10-CM | POA: Diagnosis not present

## 2021-05-15 DIAGNOSIS — J00 Acute nasopharyngitis [common cold]: Secondary | ICD-10-CM

## 2021-05-15 MED ORDER — METHYLPREDNISOLONE ACETATE 80 MG/ML IJ SUSP
80.0000 mg | Freq: Once | INTRAMUSCULAR | Status: AC
  Administered 2021-05-15: 80 mg via INTRAMUSCULAR

## 2021-05-15 NOTE — Progress Notes (Signed)
   Subjective:    Patient ID: Mckenzie Hebert, female    DOB: Aug 06, 1956, 65 y.o.   MRN: [id number]  HPI 65 year old Female says she was cleaning the blinds in her home recently. They were very dusty.  Has developed burning in her eyes and nasal congestion.  Thinks it may be related to dust exposure.  No fever or chills.  No cough.  No known COVID-19 exposure.    Review of Systems right sided headache, nasal congestion. No fever or chills.     Objective:   Physical Exam  BP 136/80 pulse 69 pulse ox 95% weight 244 pounds.  TMs are clear.  Slightly boggy nasal mucosa.  Neck is supple.  Pharynx is clear.  Chest clear.      Assessment & Plan:  Likely had dust exposure and has probable allergic rhinitis due to dust  Plan: Depo-Medrol 80 mg IM.  Call if not improving.  May take Zyrtec for a few days.  Consider Zithromax Z-PAK if not improving.

## 2021-05-15 NOTE — Patient Instructions (Signed)
Depomedrol 80 mg IM given in office which hopefully will help with headache eye irritation and nasal congestion. Call if not improving by Monday or sooner if worse. May take Zyrtec daily for a few days. Call if symptoms not improving by Monday or sooner if worse.

## 2021-05-22 MED ORDER — AZITHROMYCIN 250 MG PO TABS: ORAL_TABLET | ORAL | 0 refills | Status: AC

## 2021-05-22 NOTE — Telephone Encounter (Signed)
Mckenzie Hebert called to say her head is still stuff. She is taking Zyrtec

## 2021-05-22 NOTE — Addendum Note (Signed)
Addended by: Amado Coe on: 05/22/2021 04:49 PM   Modules accepted: Orders

## 2021-05-22 NOTE — Telephone Encounter (Signed)
Zpak sent to pharmacy.  Patient informed to call back in 7-10 days if no better.

## 2021-05-23 ENCOUNTER — Encounter (HOSPITAL_BASED_OUTPATIENT_CLINIC_OR_DEPARTMENT_OTHER): Payer: Self-pay | Admitting: Cardiovascular Disease

## 2021-05-23 NOTE — Procedures (Signed)
     Patient Name: Mckenzie, Hebert Date: 05/09/2021 Gender: Female D.O.B: 17-Jun-1956 Age (years): 65 Referring Provider: Oswaldo Milian Height (inches): 92 Interpreting Physician: Shelva Majestic MD, ABSM Weight (lbs): 243 RPSGT: Jacolyn Reedy BMI: 38 MRN: [id number] Neck Size: <br>  CLINICAL INFORMATION Sleep Study Type: HST  Indication for sleep study: Daytime somnolence, snoring  Epworth Sleepiness Score: 5  SLEEP STUDY TECHNIQUE A multi-channel overnight portable sleep study was performed. The channels recorded were: nasal airflow, thoracic respiratory movement, and oxygen saturation with a pulse oximetry. Snoring was also monitored.  MEDICATIONS albuterol (VENTOLIN HFA) 108 (90 Base) MCG/ACT inhaler ALPRAZolam (XANAX) 1 MG tablet azithromycin (ZITHROMAX) 250 MG tablet blood glucose meter kit and supplies KIT Cholecalciferol (SM VITAMIN D3) 100 MCG (4000 UT) CAPS clobetasol ointment (TEMOVATE) 0.05 % clotrimazole-betamethasone (LOTRISONE) cream ezetimibe (ZETIA) 10 MG tablet fluocinonide-emollient (LIDEX-E) 0.05 % cream levothyroxine (SYNTHROID) 150 MCG tablet metoprolol succinate (TOPROL XL) 25 MG 24 hr tablet pantoprazole (PROTONIX) 40 MG tablet Polyethyl Glycol-Propyl Glycol (SYSTANE) 0.4-0.3 % GEL ophthalmic gel rosuvastatin (CRESTOR) 10 MG tablet senna (SENOKOT) 8.6 MG tablet  Patient self administered medications include: N/A.  SLEEP ARCHITECTURE Patient was studied for 425.2 minutes. The sleep efficiency was 100.0 % and the patient was supine for 69.2%. The arousal index was 0.0 per hour.  RESPIRATORY PARAMETERS The overall AHI was 4.8 per hour, with a central apnea index of 0 per hour.  The oxygen nadir was 89% during sleep.  CARDIAC DATA Mean heart rate during sleep was 52.6 bpm.  IMPRESSIONS - Increased upper airway resistance syndrome (UARS) without definitive obstructive sleep apnea overall (AHI 4.8/h); however, mild sleep apnea  with supine sleep (AHI 6.1/h).  There is a positional component with non-supine sleep AHI 1.8/h. - Mild oxygen desaturation to a nadir of 89%. - Patient snored for 84.1 minutes (19.8% of sleep). - Average heart rate 52.6, but periods of bradycardia with pulse in the 40s, 30s and nadir of 28.  DIAGNOSIS - UARS - Sleep apnea, unspecified - Bradycardia  RECOMMENDATIONS - At present there is no indication for CPAP. - Effort should be made to optimize nasal and oropharyngeal patency. - Consider alternatives fof the treatment of snoring. - Avoid alcohol, sedatives and other CNS depressants that may worsen sleep apnea and disrupt normal sleep architecture. - Sleep hygiene should be reviewed to assess factors that may improve sleep quality. - Weight management (BMI 38) and regular exercise should be initiated or continued. - Consider a sleep clinic evaluation or reevaluation in the future is symptoms progress.   [Electronically signed] 05/23/2021 04:21 PM  Shelva Majestic MD, [facility], [place], American Board of Sleep Medicine   NPI: [id number] [place] PH: [phone]   FX: [phone] [place]

## 2021-05-26 ENCOUNTER — Telehealth: Payer: Self-pay | Admitting: *Deleted

## 2021-05-26 NOTE — Telephone Encounter (Signed)
-----   Message from Troy Sine, MD sent at 05/23/2021  4:29 PM EDT ----- Mariann Laster, please notify pt of results

## 2021-05-26 NOTE — Telephone Encounter (Signed)
Patient notified of sleep study results via [place].

## 2021-05-30 ENCOUNTER — Other Ambulatory Visit: Payer: Self-pay

## 2021-05-30 ENCOUNTER — Ambulatory Visit
Admission: RE | Admit: 2021-05-30 | Discharge: 2021-05-30 | Disposition: A | Discharge status: Home / Self Care | Payer: BC Managed Care – PPO | Source: Ambulatory Visit | Attending: Internal Medicine | Admitting: Internal Medicine

## 2021-05-30 DIAGNOSIS — Z1231 Encounter for screening mammogram for malignant neoplasm of breast: Secondary | ICD-10-CM | POA: Diagnosis not present

## 2021-06-23 ENCOUNTER — Ambulatory Visit: Payer: BC Managed Care – PPO | Admitting: Cardiology

## 2021-07-03 NOTE — Progress Notes (Signed)
Cardiology Office Note:    Date:  07/04/2021   ID:  Mckenzie Hebert, DOB 30-Oct-1955, MRN [id number]  PCP:  Elby Showers, MD  Cardiologist:  None  Electrophysiologist:  None   Referring MD: Elby Showers, MD   Chief Complaint  Patient presents with   Follow-up    3 months.   Dizziness          History of Present Illness:    Mckenzie Hebert is a 65 y.o. female with a hx of hypothyroidism, prediabetes, frequent PACs/PVCs who presents for follow-up.  She was referred by Dr. Renold Genta for evaluation of near syncopal episode, initially seen on 11/15/2020.  She works as a Sports coach and reports that on 10/10/2020 she was sitting down and felt lightheaded.  She denies any chest pain, dyspnea, palpitations.  States that she felt she was going to pass out, but turned her fan on and started taking deep breaths and symptoms improved.  She reports she lost 50 pounds from June to November last year.  Reports occasional chest pain, which she states occurs a couple times per year and describes as dull aching pain in the center of her chest that can last up to 30 minutes.  She denies any exertional chest pain.  States that she can walk up a flight of stairs without stopping, denies any chest pain or shortness of breath with this.  She smoked in her teens and 3s.  Family history includes mother had CABG and CHF, died during a nuclear stress test.  Lexiscan Myoview on 10/11/2015 showed normal perfusion, EF 64%.  Echocardiogram on 11/29/2020 showed normal biventricular function, no significant valvular disease.  Zio patch x14 days on 01/30/2021 showed frequent PVCs (10% of beats), frequent PACs (10%), 31 episodes of SVT with longest lasting 14 seconds.  Calcium score 353 (94th percentile) on 01/30/2021.  Since last clinic visit, she reports that she has been doing okay.  She felt lightheaded on metoprolol tartrate, but has improved since switching to metoprolol succinate.  She denies any more syncope or  presyncopal episodes.  Does report intermittent chest pain that occurs after eating.  Denies any exertional chest pain.  She is walking 30 minutes on the weekends.  Denies any dyspnea or palpitations.  Has had swelling in left leg intermittently since her knee surgery.    Past Medical History:  Diagnosis Date   AC (acromioclavicular) joint bone spurs 2008   right foot   Anxiety    Asthma    Back pain    Back pain    Bilateral swelling of feet    Constipation    Dyspnea    Dysrhythmia 2019   PVC   Endometrial polyp    Endometrial polyp    Heartburn    History of colon polyps    Hypothyroidism    Joint pain    Joint pain    Migraines    Dr Catalina Gravel   Osteoarthritis    Palpitations    cardiologist-  dr hochrein-- normal myoview 2017, holter 2015 mild ectopy   PONV (postoperative nausea and vomiting)    Pre-diabetes 2019   SOB (shortness of breath)    Thyroid disease    Uterine fibroid    Vitamin D deficiency     Past Surgical History:  Procedure Laterality Date   APPENDECTOMY     CARDIOVASCULAR STRESS TEST  10-11-2015   dr hochrein   normal nuclear study w/ no ischemia/  normal LV function and wall  motion, nuclear stress ef 64%   D & C HYSTERSCOPY W/ RESECTION POLYP  01-16-2010   dr gottsegen  @ [facility]   DILATATION & CURETTAGE/HYSTEROSCOPY WITH MYOSURE N/A 06/07/2018   Procedure: [place];  Surgeon: Anastasio Auerbach, MD;  [facility];  Service: Gynecology;  Laterality: N/A;  request to follow first case in [place] block time  requests one hour   FOOT SURGERY  2002, 2008   bi-lat , bone spurs, achilles tendon work   HAMMER TOE SURGERY  11/2018   RESECTION TUMOR [place]   benign   TONSILLECTOMY     TOTAL KNEE ARTHROPLASTY Left 12/09/2020   Procedure: TOTAL KNEE ARTHROPLASTY;  Surgeon: Gaynelle Arabian, MD;  Location: [facility];  Service: Orthopedics;  Laterality: Left;  54mn    Current  Medications: Current Meds  Medication Sig   albuterol (VENTOLIN HFA) 108 (90 Base) MCG/ACT inhaler INHALE 2 PUFFS INTO THE LUNGS EVERY 6 HOURS AS NEEDED FOR WHEEZING OR SHORTNESS OF BREATH   ALPRAZolam (XANAX) 1 MG tablet TAKE 1 TABLET(1 MG) BY MOUTH THREE TIMES DAILY   blood glucose meter kit and supplies KIT by Does not apply route daily as needed. Check glucose daily before breakfast and supper.   Cholecalciferol (SM VITAMIN D3) 100 MCG (4000 UT) CAPS Take 4,000 Units by mouth daily.   clobetasol ointment (TEMOVATE) 01.88% Apply 1 application topically 2 (two) times daily. (Patient taking differently: Apply 1 application topically daily as needed (rash).)   clotrimazole-betamethasone (LOTRISONE) cream Apply 1 application topically 2 (two) times daily.   ezetimibe (ZETIA) 10 MG tablet Take 1 tablet (10 mg total) by mouth daily.   fluocinonide-emollient (LIDEX-E) 0.05 % cream Apply 1 application topically 2 (two) times daily.   levothyroxine (SYNTHROID) 150 MCG tablet TAKE 1 TABLET(150 MCG) BY MOUTH DAILY   metoprolol succinate (TOPROL XL) 25 MG 24 hr tablet Take 1 tablet (25 mg total) by mouth daily.   pantoprazole (PROTONIX) 40 MG tablet TAKE 1 TABLET BY MOUTH DAILY (Patient taking differently: Take 40 mg by mouth at bedtime.)   Polyethyl Glycol-Propyl Glycol (SYSTANE) 0.4-0.3 % GEL ophthalmic gel Place 1 application into both eyes at bedtime as needed (dry eyes).   senna (SENOKOT) 8.6 MG tablet Take 2 tablets by mouth at bedtime.     Allergies:   Percocet [oxycodone-acetaminophen]   Social History   Socioeconomic History   Marital status: Widowed    Spouse name: Not on file   Number of children: Not on file   Years of education: Not on file   Highest education level: Not on file  Occupational History   Occupation: Histology    Employer: [employer]  Tobacco Use   Smoking status: Former    Types: Cigarettes    Quit date: 1973    Years since quitting: 49.8   Smokeless  tobacco: Never  Vaping Use   Vaping Use: Never used  Substance and Sexual Activity   Alcohol use: No    Alcohol/week: 0.0 standard drinks   Drug use: No   Sexual activity: Not Currently    Comment: INTERCOURSE AGE 20, SEXUAL PARTNERS LESS THAN 5  Other Topics Concern   Not on file  Social History Narrative   Lives alone.     Social Determinants of Health   Financial Resource Strain: Not on file  Food Insecurity: Not on file  Transportation Needs: Not on file  Physical Activity: Not on file  Stress: Not on file  Social Connections: Not on file     Family History: The patient'sfamily history includes Alcohol abuse in her father; Autoimmune disease in her sister; Breast cancer in her paternal aunt; Cancer in her father; Depression in her father; Diabetes in her mother and sister; Heart disease (age of onset: 46) in her mother; Hyperlipidemia in her mother; Hypertension in her mother, sister, and sister; Leukemia in her paternal grandmother; Mental retardation in an other family member; Stroke in her mother; Thyroid disease in her sister.  ROS:   Please see the history of present illness.    All other systems reviewed and are negative.  EKGs/Labs/Other Studies Reviewed:    The following studies were reviewed today:   EKG:  EKG is  ordered today.  The ekg ordered today demonstrates normal sinus rhythm, PACs, rate 62,   Recent Labs: 01/28/2021: ALT 19; BUN 15; Creat 0.64; Hemoglobin 13.5; Platelets 261; Potassium 4.7; Sodium 138; TSH 1.20  Recent Lipid Panel    Component Value Date/Time   CHOL 203 (H) 01/28/2021 0919   TRIG 103 01/28/2021 0919   HDL 55 01/28/2021 0919   CHOLHDL 3.7 01/28/2021 0919   VLDL 17 05/12/2016 0933   LDLCALC 127 (H) 01/28/2021 0919    Physical Exam:    VS:  BP 128/78 (BP Location: Left Arm, Patient Position: Sitting, Cuff Size: Large)   Pulse 62   Ht _0  (1.702 m)   Wt 245 lb (111.1 kg)   LMP 03/18/2008 (LMP Unknown)   BMI 38.37 kg/m      Wt Readings from Last 3 Encounters:  07/04/21 245 lb (111.1 kg)  05/15/21 244 lb (110.7 kg)  05/06/21 243 lb (110.2 kg)     GEN: Well nourished, well developed in no acute distress HEENT: Normal NECK: No JVD; No carotid bruits CARDIAC: Irregular, normal rate, no murmurs RESPIRATORY:  Clear to auscultation without rales, wheezing or rhonchi  ABDOMEN: Soft, non-tender, non-distended MUSCULOSKELETAL:  No edema; No deformity  SKIN: Warm and dry NEUROLOGIC:  Alert and oriented x 3 PSYCHIATRIC:  Normal affect   ASSESSMENT:    1. Near syncope   2. PVC's (premature ventricular contractions)   3. Premature atrial contractions   4. Hyperlipidemia, unspecified hyperlipidemia type   5. Dilatation of aorta (HCC)   6. Daytime somnolence      PLAN:    Near syncope: Unclear cause.  Echocardiogram on 11/29/2020 showed normal biventricular function, no significant valvular disease.  Zio patch x14 days on 01/30/2021 showed frequent PVCs (10% of beats), frequent PACs (10%), 31 episodes of SVT with longest lasting 14 seconds.   -Started Toprol-XL 25 mg daily  Frequent PACs/PVCs: Zio patch x14 days on 01/30/2021 showed frequent PVCs (10% of beats), frequent PACs (10%), 31 episodes of SVT with longest lasting 14 seconds.   -Started Toprol-XL 25 mg daily.  We will repeat Zio patch x3 days to monitor for improvement since starting metoprolol  Hyperlipidemia: On Zetia 10 mg daily.  LDL 127 on 01/28/21.  10-year ASCVD risk score 5%.  Calcium score 353 (94th percentile) on 01/30/2021.  Started rosuvastatin 10 mg daily.  Will recheck lipid panel  Dilated aorta: Dilated ascending aorta measured 41 mm on calcium score 02/27/2021.  Recommend CTA chest in 1 year to follow.  Daytime somnolence: Sleep study 05/09/2021 showed mild OSA, no indication for CPAP at this time  RTC in 6 months   Medication Adjustments/Labs and Tests Ordered: Current medicines are reviewed at length with  the patient today.  Concerns  regarding medicines are outlined above.  Orders Placed This Encounter  Procedures   CT ANGIO CHEST AORTA W/CM & OR WO/CM   Lipid panel   LONG TERM MONITOR (3-14 DAYS)   EKG 12-Lead     No orders of the defined types were placed in this encounter.    Patient Instructions  Medication Instructions:  Your physician recommends that you continue on your current medications as directed. Please refer to the Current Medication list given to you today.  *If you need a refill on your cardiac medications before your next appointment, please call your pharmacy*   Lab Work: Lipid panel today  If you have labs (blood work) drawn today and your tests are completely normal, you will receive your results only by: [place] (if you have MyChart) OR A paper copy in the mail If you have any lab test that is abnormal or we need to change your treatment, we will call you to review the results.   Testing/Procedures: CTA chest/aorta due in 1 year  ZIO XT- Long Term Monitor Instructions   Your physician has requested you wear a ZIO patch monitor for _3__ days.  This is a single patch monitor.   [employer] supplies one patch monitor per enrollment. Additional stickers are not available. Please do not apply patch if you will be having a Nuclear Stress Test, Echocardiogram, Cardiac CT, MRI, or Chest Xray during the period you would be wearing the monitor. The patch cannot be worn during these tests. You cannot remove and re-apply the ZIO XT patch monitor.  Your ZIO patch monitor will be sent Fed Ex from [employer] directly to your home address. It may take 3-5 days to receive your monitor after you have been enrolled.  Once you have received your monitor, please review the enclosed instructions. Your monitor has already been registered assigning a specific monitor serial # to you.  Billing and Patient Assistance Program Information   We have supplied [employer] with any of your insurance  information on file for billing purposes. [employer] offers a sliding scale Patient Assistance Program for patients that do not have insurance, or whose insurance does not completely cover the cost of the ZIO monitor.   You must apply for the Patient Assistance Program to qualify for this discounted rate.     To apply, please call [employer] at [phone], select option 4, then select option 2, and ask to apply for Patient Assistance Program.  Theodore Demark will ask your household income, and how many people are in your household.  They will quote your out-of-pocket cost based on that information.  [employer] will also be able to set up a 5-month interest-free payment plan if needed.  Applying the monitor   Shave hair from upper left chest.  Hold abrader disc by orange tab. Rub abrader in 40 strokes over the upper left chest as indicated in your monitor instructions.  Clean area with 4 enclosed alcohol pads. Let dry.  Apply patch as indicated in monitor instructions. Patch will be placed under collarbone on left side of chest with arrow pointing upward.  Rub patch adhesive wings for 2 minutes. Remove white label marked "1". Remove the white label marked "2". Rub patch adhesive wings for 2 additional minutes.  While looking in a mirror, press and release button in center of patch. A small green light will flash 3-4 times. This will be your only indicator that the monitor has been turned on. ?  Do not shower for the first 24 hours. You may shower after the first 24 hours.  Press the button if you feel a symptom. You will hear a small click. Record Date, Time and Symptom in the Patient Logbook.  When you are ready to remove the patch, follow instructions on the last 2 pages of the Patient Logbook. Stick patch monitor onto the last page of Patient Logbook.  Place Patient Logbook in the blue and white box.  Use locking tab on box and tape box closed securely.  The blue and white box has prepaid postage on it. Please  place it in the mailbox as soon as possible. Your physician should have your test results approximately 7 days after the monitor has been mailed back to [facility].  Call [place] at [phone] if you have questions regarding your ZIO XT patch monitor. Call them immediately if you see an orange light blinking on your monitor.  If your monitor falls off in less than 4 days, contact our Monitor department at [phone]. ?If your monitor becomes loose or falls off after 4 days call [employer] at [phone] for suggestions on securing your monitor.?  Follow-Up: At [facility], you and your health needs are our priority.  As part of our continuing mission to provide you with exceptional heart care, we have created designated Provider Care Teams.  These Care Teams include your primary Cardiologist (physician) and Advanced Practice Providers (APPs -  Physician Assistants and Nurse Practitioners) who all work together to provide you with the care you need, when you need it.  We recommend signing up for the patient portal called "MyChart".  Sign up information is provided on this After Visit Summary.  MyChart is used to connect with patients for Virtual Visits (Telemedicine).  Patients are able to view lab/test results, encounter notes, upcoming appointments, etc.  Non-urgent messages can be sent to your provider as well.   To learn more about what you can do with MyChart, go to [email or web address].    Your next appointment:   6 month(s)  The format for your next appointment:   In Person  Provider:   Oswaldo Milian, MD    Signed, Donato Heinz, MD  07/04/2021 3:34 PM    [place]

## 2021-07-04 ENCOUNTER — Encounter: Payer: Self-pay | Admitting: Cardiology

## 2021-07-04 ENCOUNTER — Ambulatory Visit (INDEPENDENT_AMBULATORY_CARE_PROVIDER_SITE_OTHER): Payer: BC Managed Care – PPO

## 2021-07-04 ENCOUNTER — Other Ambulatory Visit: Payer: Self-pay

## 2021-07-04 ENCOUNTER — Ambulatory Visit (INDEPENDENT_AMBULATORY_CARE_PROVIDER_SITE_OTHER): Payer: BC Managed Care – PPO | Admitting: Cardiology

## 2021-07-04 VITALS — BP 128/78 | HR 62 | Ht 67.0 in | Wt 245.0 lb

## 2021-07-04 DIAGNOSIS — I493 Ventricular premature depolarization: Secondary | ICD-10-CM

## 2021-07-04 DIAGNOSIS — I491 Atrial premature depolarization: Secondary | ICD-10-CM | POA: Diagnosis not present

## 2021-07-04 DIAGNOSIS — E785 Hyperlipidemia, unspecified: Secondary | ICD-10-CM

## 2021-07-04 DIAGNOSIS — R55 Syncope and collapse: Secondary | ICD-10-CM

## 2021-07-04 DIAGNOSIS — I77819 Aortic ectasia, unspecified site: Secondary | ICD-10-CM

## 2021-07-04 DIAGNOSIS — R400 Somnolence: Secondary | ICD-10-CM

## 2021-07-04 NOTE — Progress Notes (Unsigned)
Enrolled patient for a 3 day Zio XT monitor to be mailed to patients home  

## 2021-07-04 NOTE — Patient Instructions (Signed)
Medication Instructions:  Your physician recommends that you continue on your current medications as directed. Please refer to the Current Medication list given to you today.  *If you need a refill on your cardiac medications before your next appointment, please call your pharmacy*   Lab Work: Lipid panel today  If you have labs (blood work) drawn today and your tests are completely normal, you will receive your results only by: [place] (if you have MyChart) OR A paper copy in the mail If you have any lab test that is abnormal or we need to change your treatment, we will call you to review the results.   Testing/Procedures: CTA chest/aorta due in 1 year  ZIO XT- Long Term Monitor Instructions   Your physician has requested you wear a ZIO patch monitor for _3__ days.  This is a single patch monitor.   [employer] supplies one patch monitor per enrollment. Additional stickers are not available. Please do not apply patch if you will be having a Nuclear Stress Test, Echocardiogram, Cardiac CT, MRI, or Chest Xray during the period you would be wearing the monitor. The patch cannot be worn during these tests. You cannot remove and re-apply the ZIO XT patch monitor.  Your ZIO patch monitor will be sent Fed Ex from [employer] directly to your home address. It may take 3-5 days to receive your monitor after you have been enrolled.  Once you have received your monitor, please review the enclosed instructions. Your monitor has already been registered assigning a specific monitor serial # to you.  Billing and Patient Assistance Program Information   We have supplied [employer] with any of your insurance information on file for billing purposes. [employer] offers a sliding scale Patient Assistance Program for patients that do not have insurance, or whose insurance does not completely cover the cost of the ZIO monitor.   You must apply for the Patient Assistance Program to qualify for this  discounted rate.     To apply, please call [employer] at [phone], select option 4, then select option 2, and ask to apply for Patient Assistance Program.  Theodore Demark will ask your household income, and how many people are in your household.  They will quote your out-of-pocket cost based on that information.  [employer] will also be able to set up a 70-month, interest-free payment plan if needed.  Applying the monitor   Shave hair from upper left chest.  Hold abrader disc by orange tab. Rub abrader in 40 strokes over the upper left chest as indicated in your monitor instructions.  Clean area with 4 enclosed alcohol pads. Let dry.  Apply patch as indicated in monitor instructions. Patch will be placed under collarbone on left side of chest with arrow pointing upward.  Rub patch adhesive wings for 2 minutes. Remove white label marked "1". Remove the white label marked "2". Rub patch adhesive wings for 2 additional minutes.  While looking in a mirror, press and release button in center of patch. A small green light will flash 3-4 times. This will be your only indicator that the monitor has been turned on. ?  Do not shower for the first 24 hours. You may shower after the first 24 hours.  Press the button if you feel a symptom. You will hear a small click. Record Date, Time and Symptom in the Patient Logbook.  When you are ready to remove the patch, follow instructions on the last 2 pages of the Patient Logbook. Stick patch monitor  onto the last page of Patient Logbook.  Place Patient Logbook in the blue and white box.  Use locking tab on box and tape box closed securely.  The blue and white box has prepaid postage on it. Please place it in the mailbox as soon as possible. Your physician should have your test results approximately 7 days after the monitor has been mailed back to [facility].  Call [place] at [phone] if you have questions regarding your ZIO XT patch monitor. Call  them immediately if you see an orange light blinking on your monitor.  If your monitor falls off in less than 4 days, contact our Monitor department at [phone]. ?If your monitor becomes loose or falls off after 4 days call [employer] at [phone] for suggestions on securing your monitor.?  Follow-Up: At [facility], you and your health needs are our priority.  As part of our continuing mission to provide you with exceptional heart care, we have created designated Provider Care Teams.  These Care Teams include your primary Cardiologist (physician) and Advanced Practice Providers (APPs -  Physician Assistants and Nurse Practitioners) who all work together to provide you with the care you need, when you need it.  We recommend signing up for the patient portal called "MyChart".  Sign up information is provided on this After Visit Summary.  MyChart is used to connect with patients for Virtual Visits (Telemedicine).  Patients are able to view lab/test results, encounter notes, upcoming appointments, etc.  Non-urgent messages can be sent to your provider as well.   To learn more about what you can do with MyChart, go to [email or web address].    Your next appointment:   6 month(s)  The format for your next appointment:   In Person  Provider:   Oswaldo Milian, MD

## 2021-07-05 LAB — LIPID PANEL
Chol/HDL Ratio: 2.1 ratio (ref 0.0–4.4)
Cholesterol, Total: 132 mg/dL (ref 100–199)
HDL: 63 mg/dL (ref >39)
LDL Chol Calc (NIH): 53 mg/dL (ref 0–99)
Triglycerides: 82 mg/dL (ref 0–149)
VLDL Cholesterol Cal: 16 mg/dL (ref 5–40)

## 2021-07-08 DIAGNOSIS — I491 Atrial premature depolarization: Secondary | ICD-10-CM | POA: Diagnosis not present

## 2021-07-08 DIAGNOSIS — I493 Ventricular premature depolarization: Secondary | ICD-10-CM

## 2021-07-09 ENCOUNTER — Telehealth: Payer: Self-pay

## 2021-07-09 NOTE — Telephone Encounter (Signed)
Patient noticed a knot on her shoulder that is causing pain into her arm. Doesn't remember injuring it. She would like an appt after 230 one day for you to evaluate. Please advise.

## 2021-07-10 NOTE — Telephone Encounter (Signed)
Patient called and got an appt with ortho yesterday scheduled for next week.

## 2021-07-12 ENCOUNTER — Other Ambulatory Visit: Payer: Self-pay | Admitting: Internal Medicine

## 2021-07-16 ENCOUNTER — Other Ambulatory Visit: Payer: Self-pay | Admitting: Internal Medicine

## 2021-07-17 DIAGNOSIS — M25511 Pain in right shoulder: Secondary | ICD-10-CM | POA: Diagnosis not present

## 2021-07-29 ENCOUNTER — Other Ambulatory Visit: Payer: Self-pay

## 2021-07-29 ENCOUNTER — Other Ambulatory Visit: Payer: BC Managed Care – PPO | Admitting: Internal Medicine

## 2021-07-29 DIAGNOSIS — E1169 Type 2 diabetes mellitus with other specified complication: Secondary | ICD-10-CM

## 2021-07-29 DIAGNOSIS — E1165 Type 2 diabetes mellitus with hyperglycemia: Secondary | ICD-10-CM | POA: Diagnosis not present

## 2021-07-29 DIAGNOSIS — E785 Hyperlipidemia, unspecified: Secondary | ICD-10-CM

## 2021-07-29 DIAGNOSIS — E559 Vitamin D deficiency, unspecified: Secondary | ICD-10-CM

## 2021-07-29 DIAGNOSIS — E039 Hypothyroidism, unspecified: Secondary | ICD-10-CM | POA: Diagnosis not present

## 2021-07-30 LAB — HEMOGLOBIN A1C
Hgb A1c MFr Bld: 6.1 % of total Hgb — ABNORMAL HIGH (ref <5.7)
Mean Plasma Glucose: 128 mg/dL
eAG (mmol/L): 7.1 mmol/L

## 2021-07-30 LAB — LIPID PANEL
Cholesterol: 124 mg/dL (ref <200)
HDL: 62 mg/dL (ref >=50)
LDL Cholesterol (Calc): 46 mg/dL (calc)
Non-HDL Cholesterol (Calc): 62 mg/dL (calc) (ref <130)
Total CHOL/HDL Ratio: 2 (calc) (ref <5.0)
Triglycerides: 81 mg/dL (ref <150)

## 2021-07-30 LAB — VITAMIN D 25 HYDROXY (VIT D DEFICIENCY, FRACTURES): Vit D, 25-Hydroxy: 31 ng/mL (ref 30–100)

## 2021-07-30 LAB — MICROALBUMIN / CREATININE URINE RATIO
Creatinine, Urine: 91 mg/dL (ref 20–275)
Microalb, Ur: <0.2 mg/dL

## 2021-07-30 LAB — TSH: TSH: 0.9 mIU/L (ref 0.40–4.50)

## 2021-08-05 ENCOUNTER — Other Ambulatory Visit: Payer: Self-pay

## 2021-08-05 ENCOUNTER — Ambulatory Visit: Payer: BC Managed Care – PPO | Admitting: Internal Medicine

## 2021-08-05 ENCOUNTER — Ambulatory Visit (INDEPENDENT_AMBULATORY_CARE_PROVIDER_SITE_OTHER): Payer: BC Managed Care – PPO | Admitting: Internal Medicine

## 2021-08-05 ENCOUNTER — Encounter: Payer: Self-pay | Admitting: Internal Medicine

## 2021-08-05 VITALS — BP 122/72 | HR 69 | Ht 67.0 in | Wt 248.0 lb

## 2021-08-05 DIAGNOSIS — Z8639 Personal history of other endocrine, nutritional and metabolic disease: Secondary | ICD-10-CM | POA: Diagnosis not present

## 2021-08-05 DIAGNOSIS — E039 Hypothyroidism, unspecified: Secondary | ICD-10-CM

## 2021-08-05 DIAGNOSIS — J0100 Acute maxillary sinusitis, unspecified: Secondary | ICD-10-CM

## 2021-08-05 DIAGNOSIS — Z6838 Body mass index (BMI) 38.0-38.9, adult: Secondary | ICD-10-CM

## 2021-08-05 DIAGNOSIS — F419 Anxiety disorder, unspecified: Secondary | ICD-10-CM

## 2021-08-05 DIAGNOSIS — E782 Mixed hyperlipidemia: Secondary | ICD-10-CM

## 2021-08-05 DIAGNOSIS — E1169 Type 2 diabetes mellitus with other specified complication: Secondary | ICD-10-CM

## 2021-08-05 DIAGNOSIS — F5105 Insomnia due to other mental disorder: Secondary | ICD-10-CM

## 2021-08-05 DIAGNOSIS — Z96651 Presence of right artificial knee joint: Secondary | ICD-10-CM

## 2021-08-05 DIAGNOSIS — Z8719 Personal history of other diseases of the digestive system: Secondary | ICD-10-CM

## 2021-08-05 DIAGNOSIS — F409 Phobic anxiety disorder, unspecified: Secondary | ICD-10-CM

## 2021-08-05 DIAGNOSIS — J22 Unspecified acute lower respiratory infection: Secondary | ICD-10-CM

## 2021-08-05 DIAGNOSIS — R002 Palpitations: Secondary | ICD-10-CM

## 2021-08-05 DIAGNOSIS — E785 Hyperlipidemia, unspecified: Secondary | ICD-10-CM

## 2021-08-05 MED ORDER — AZITHROMYCIN 250 MG PO TABS
ORAL_TABLET | ORAL | 1 refills | Status: AC
Start: 2021-08-05 — End: 2021-08-10

## 2021-08-05 NOTE — Progress Notes (Signed)
   Subjective:    Patient ID: Mckenzie Hebert, female    DOB: May 01, 1956, 65 y.o.   MRN: [id number]  HPI 65 year old Female seen for 46-month follow-up.  She has a history of PVCs and is followed by Dr. Edmund Hilda, Cardiologist.  History of near syncope in February 2022.  Myoview scan in 2017 was normal.  She was switched from metoprolol to tartrate to metoprolol succinate and has had less lightheadedness.  Does have some intermittent chest pain after eating.  No exertional chest pain.  Tries to walk on weekends.  Had sleep study by Dr. Claiborne Billings showing mild O2 desat and snoring.  Patient had periods of bradycardia with pulse in the 40s, 30s and a nadir of 28.  He indicated there was no reason for CPAP at the present time.  Advised weight loss.  She has a history of hypothyroidism and TSH is normal on thyroid replacement medication.  She has impaired glucose tolerance and hemoglobin A1c is 6.1%.  We should consider metformin treatment for her.  She is on levothyroxine 150 mcg daily for hypothyroidism and TSH is normal at 0.90.  Lipid panel normal on statin Zetia and Crestor.  She should continue these 2 medications.  Her vitamin D level is low normal at 31 and 2 years ago was 28.  She should take 4000 to 5000 units daily vitamin D3.  GE reflux treated with Protonix.  Review of Systems see above.  Says she has had recent sinusitis and recent lower respiratory infection.     Objective:   Physical Exam Vital signs reviewed.  Blood pressure excellent 122/72 pulse 69 pulse oximetry 96% weight 248 pounds BMI 38.84  Pharynx is clear.  TMs are clear.  Neck is supple.  No JVD thyromegaly or carotid bruits.  Chest is clear to auscultation.  Cardiac exam regular rate and rhythm without ectopy.  No lower extremity pitting edema.      Assessment & Plan:  Protracted sinusitis and recent lower respiratory infection.  Have given her a tapering course of methylprednisolone 4 mg tablets starting with 6  tablets day 1 and decreasing by 1 tablet daily.  She has requested Hycodan to have on hand for cough.  She was not given antibiotic today.  If not improving she will be referred to Allergist.  Has Ventolin inhaler on hand.  Also prescribe Zithromax.  Palpitations treated with metoprolol  Hypothyroidism treated with levothyroxine 150 mcg daily and TSH is excellent at 0.90  Type 2 diabetes mellitus-hemoglobin A1c has increased to 6.1%.  Has not wanted to be on medication for diabetes but we might need to consider that in the near future.  Work on diet exercise and weight loss.  Hyperlipidemia treated with Crestor 10 mg daily and lipid panel is excellent.  Anxiety treated with Xanax up to 1 mg 3 times a day if needed  History of palpitations treated with metoprolol  Plan: Follow-up in June for Welcome to Medicare physical exam and health maintenance examination as well as evaluation of medical issues.

## 2021-08-05 NOTE — Patient Instructions (Addendum)
Take Zithromax Z Pak as directed. Take Prednisone in tapering course as directed 6-5-4-3-2-1.  Take Hycodan sparingly for cough.  Continue levothyroxine 150 mcg daily.  Continue metoprolol for palpitations.  Continue Crestor 10 mg daily.  Try to walk some for exercise and lose some weight.  Follow-up in June.

## 2021-08-06 MED ORDER — METHYLPREDNISOLONE 4 MG PO TABS: ORAL_TABLET | ORAL | 0 refills | Status: DC

## 2021-08-06 MED ORDER — HYDROCODONE BIT-HOMATROP MBR 5-1.5 MG/5ML PO SOLN: 5.0000 mL | Freq: Three times a day (TID) | ORAL | 0 refills | Status: DC | PRN

## 2021-08-08 DIAGNOSIS — Z1211 Encounter for screening for malignant neoplasm of colon: Secondary | ICD-10-CM | POA: Diagnosis not present

## 2021-08-08 DIAGNOSIS — D122 Benign neoplasm of ascending colon: Secondary | ICD-10-CM | POA: Diagnosis not present

## 2021-08-08 DIAGNOSIS — K635 Polyp of colon: Secondary | ICD-10-CM | POA: Diagnosis not present

## 2021-08-08 DIAGNOSIS — K5730 Diverticulosis of large intestine without perforation or abscess without bleeding: Secondary | ICD-10-CM | POA: Diagnosis not present

## 2021-08-08 DIAGNOSIS — D125 Benign neoplasm of sigmoid colon: Secondary | ICD-10-CM | POA: Diagnosis not present

## 2021-08-08 LAB — HM COLONOSCOPY

## 2021-08-19 ENCOUNTER — Telehealth: Payer: Self-pay | Admitting: Cardiology

## 2021-08-19 MED ORDER — METOPROLOL SUCCINATE ER 25 MG PO TB24: 37.5000 mg | ORAL_TABLET | Freq: Every day | ORAL | 3 refills | Status: DC

## 2021-08-19 NOTE — Telephone Encounter (Signed)
Donato Heinz, MD  07/27/2021  6:17 PM EST     Improvement in SVT burden and PVCs, though continues to have frequent PACs.  If she is agreeable, recommend increasing metoprolol dose to 37.5 mg daily  The patient has been notified of the result and verbalized understanding.  All questions (if any) were answered. Antonieta Iba, RN 08/19/2021 8:24 AM  Patient is agreeable to try increasing her metoprolol to 37.5 mg daily. She will let us know if she is unable to tolerate it.

## 2021-08-19 NOTE — Telephone Encounter (Signed)
    Pt is returning call to get heart monitor result 

## 2021-09-10 DIAGNOSIS — Z0289 Encounter for other administrative examinations: Secondary | ICD-10-CM

## 2021-09-17 ENCOUNTER — Other Ambulatory Visit: Payer: Self-pay

## 2021-09-17 ENCOUNTER — Other Ambulatory Visit (INDEPENDENT_AMBULATORY_CARE_PROVIDER_SITE_OTHER): Payer: Self-pay

## 2021-09-17 ENCOUNTER — Encounter (INDEPENDENT_AMBULATORY_CARE_PROVIDER_SITE_OTHER): Payer: Self-pay | Admitting: Family Medicine

## 2021-09-17 ENCOUNTER — Ambulatory Visit (INDEPENDENT_AMBULATORY_CARE_PROVIDER_SITE_OTHER): Payer: Medicare HMO | Admitting: Family Medicine

## 2021-09-17 VITALS — BP 117/83 | HR 61 | Temp 98.2°F | Ht 67.0 in | Wt 251.0 lb

## 2021-09-17 DIAGNOSIS — R002 Palpitations: Secondary | ICD-10-CM | POA: Diagnosis not present

## 2021-09-17 DIAGNOSIS — E559 Vitamin D deficiency, unspecified: Secondary | ICD-10-CM

## 2021-09-17 DIAGNOSIS — E039 Hypothyroidism, unspecified: Secondary | ICD-10-CM

## 2021-09-17 DIAGNOSIS — Z1331 Encounter for screening for depression: Secondary | ICD-10-CM

## 2021-09-17 DIAGNOSIS — E1169 Type 2 diabetes mellitus with other specified complication: Secondary | ICD-10-CM | POA: Diagnosis not present

## 2021-09-17 DIAGNOSIS — E1165 Type 2 diabetes mellitus with hyperglycemia: Secondary | ICD-10-CM

## 2021-09-17 DIAGNOSIS — E785 Hyperlipidemia, unspecified: Secondary | ICD-10-CM | POA: Diagnosis not present

## 2021-09-17 DIAGNOSIS — R5383 Other fatigue: Secondary | ICD-10-CM

## 2021-09-17 DIAGNOSIS — Z6839 Body mass index (BMI) 39.0-39.9, adult: Secondary | ICD-10-CM

## 2021-09-17 DIAGNOSIS — R0602 Shortness of breath: Secondary | ICD-10-CM | POA: Diagnosis not present

## 2021-09-18 LAB — CBC WITH DIFFERENTIAL/PLATELET
Basophils Absolute: 0 10*3/uL (ref 0.0–0.2)
Basos: 1 %
EOS (ABSOLUTE): 0.3 10*3/uL (ref 0.0–0.4)
Eos: 6 %
Hematocrit: 39.6 % (ref 34.0–46.6)
Hemoglobin: 13.6 g/dL (ref 11.1–15.9)
Immature Grans (Abs): 0 10*3/uL (ref 0.0–0.1)
Immature Granulocytes: 0 %
Lymphocytes Absolute: 1.2 10*3/uL (ref 0.7–3.1)
Lymphs: 23 %
MCH: 28.5 pg (ref 26.6–33.0)
MCHC: 34.3 g/dL (ref 31.5–35.7)
MCV: 83 fL (ref 79–97)
Monocytes Absolute: 0.4 10*3/uL (ref 0.1–0.9)
Monocytes: 7 %
Neutrophils Absolute: 3.3 10*3/uL (ref 1.4–7.0)
Neutrophils: 63 %
Platelets: 239 10*3/uL (ref 150–450)
RBC: 4.77 x10E6/uL (ref 3.77–5.28)
RDW: 13 % (ref 11.7–15.4)
WBC: 5.2 10*3/uL (ref 3.4–10.8)

## 2021-09-18 LAB — COMPREHENSIVE METABOLIC PANEL
ALT: 35 IU/L — ABNORMAL HIGH (ref 0–32)
AST: 30 IU/L (ref 0–40)
Albumin/Globulin Ratio: 1.9 (ref 1.2–2.2)
Albumin: 4.6 g/dL (ref 3.8–4.8)
Alkaline Phosphatase: 66 IU/L (ref 44–121)
BUN/Creatinine Ratio: 19 (ref 12–28)
BUN: 13 mg/dL (ref 8–27)
Bilirubin Total: 0.6 mg/dL (ref 0.0–1.2)
CO2: 25 mmol/L (ref 20–29)
Calcium: 9.2 mg/dL (ref 8.7–10.3)
Chloride: 100 mmol/L (ref 96–106)
Creatinine, Ser: 0.7 mg/dL (ref 0.57–1.00)
Globulin, Total: 2.4 g/dL (ref 1.5–4.5)
Glucose: 100 mg/dL — ABNORMAL HIGH (ref 70–99)
Potassium: 4.5 mmol/L (ref 3.5–5.2)
Sodium: 141 mmol/L (ref 134–144)
Total Protein: 7 g/dL (ref 6.0–8.5)
eGFR: 95 mL/min/{1.73_m2} (ref >59)

## 2021-09-18 LAB — FOLATE: Folate: 15.6 ng/mL (ref >3.0)

## 2021-09-18 LAB — T3: T3, Total: 92 ng/dL (ref 71–180)

## 2021-09-18 LAB — INSULIN, RANDOM: INSULIN: 9.7 u[IU]/mL (ref 2.6–24.9)

## 2021-09-18 LAB — T4, FREE: Free T4: 1.61 ng/dL (ref 0.82–1.77)

## 2021-09-18 LAB — VITAMIN B12: Vitamin B-12: 610 pg/mL (ref 232–1245)

## 2021-09-22 NOTE — Progress Notes (Signed)
Chief Complaint:   OBESITY Mckenzie Hebert (MR# [id number]) is a 66 y.o. female who presents for evaluation and treatment of obesity and related comorbidities. Current BMI is Body mass index is 39.31 kg/m. Mckenzie Hebert has been struggling with her weight for many years and has been unsuccessful in either losing weight, maintaining weight loss, or reaching her healthy weight goal.  Mckenzie Hebert is currently in the action stage of change and ready to dedicate time achieving and maintaining a healthier weight. Mckenzie Hebert is interested in becoming our patient and working on intensive lifestyle modifications including (but not limited to) diet and exercise for weight loss.  Pt got her knee replaced since her last OV 06/13/2020. She also saw cardiology and was placed on metoprolol and statin. She is working 6a-2:30p. She is occasionally doing take out or fast food. Cup of cereal (Whole Grain Cheerios) with milk (not Fairlife) + fruit (1/2 cup) (feel satisfied) + coffee. ~9:30- a yogurt +/- nutri-grain bar or fiber one; Sandwich- protein (2 oz) + cheese + mayo (satisfied); Snack- fruit (apple or tangerine); Supper varies from take out to whatever she can get from the fridge; After dinner- sugar free popsicle.  Mckenzie Hebert's habits were reviewed today and are as follows: she struggles with family and or coworkers weight loss sabotage, her desired weight loss is 91 lbs, she started gaining weight after her husband passed, her heaviest weight ever was 288 pounds, she has significant food cravings issues, she snacks frequently in the evenings, she wakes up frequently in the middle of the night to eat, she is frequently drinking liquids with calories, she frequently makes poor food choices, she frequently eats larger portions than normal, she has binge eating behaviors, and she struggles with emotional eating.  Depression Screen Mckenzie Hebert's Food and Mood (modified PHQ-9) score was 20.  Depression screen PHQ 2/9 09/17/2021   Decreased Interest 3  Down, Depressed, Hopeless 3  PHQ - 2 Score 6  Altered sleeping 3  Tired, decreased energy 3  Change in appetite 3  Feeling bad or failure about yourself  3  Trouble concentrating 1  Moving slowly or fidgety/restless 1  Suicidal thoughts 0  PHQ-9 Score 20  Difficult doing work/chores Not difficult at all  Some recent data might be hidden   Subjective:   1. Other fatigue Mckenzie Hebert admits to daytime somnolence and admits to waking up still tired. Patent has a history of symptoms of daytime fatigue, morning fatigue, and morning headache. Mckenzie Hebert generally gets  ?  hours of sleep per night, and states that she has poor sleep quality. Snoring "I have no idea" present. Apneic episodes are present. Epworth Sleepiness Score is 9. Significant increase in fatigue since initial appt.  2. SOBOE (shortness of breath on exertion) Mckenzie Hebert Pulse notes increasing shortness of breath with exercising and seems to be worsening over time with weight gain. She notes getting out of breath sooner with activity than she used to. This has gotten worse recently. [name] denies shortness of breath at rest or orthopnea.  3. Hyperlipidemia associated with type 2 diabetes mellitus ([place]) Pt is on Zetia and rosuvastatin. She sees cardiology. Pt has an LDL of 46, HDL 62, and total cholesterol 124.  4. Type 2 diabetes mellitus with hyperglycemia, without long-term current use of insulin (HCC) Pt's last A1c was 6.1 (prior 5.7). She is not on meds. Her last eye exam was last February.  5. Hypothyroidism, unspecified type She is on levothyroxine. Pt denies cold or hot  intolerances or palpitations. Her last TSH was 0.9.  6. Vitamin D deficiency Pt is on OTC Vit D 4,000 IU daily. Her last Vit D level was 31 and she reports fatigue.  7. Palpitations Pt sees cardiology (Dr. Gardiner Rhyme). She is on metoprolol.  Assessment/Plan:   1. Other fatigue [name] does feel that her weight is causing her energy to be lower  than it should be. Fatigue may be related to obesity, depression or many other causes. Labs will be ordered, and in the meanwhile, Mckenzie Hebert will focus on self care including making healthy food choices, increasing physical activity and focusing on stress reduction. Check labs today.  - Vitamin B12 - Folate  2. SOBOE (shortness of breath on exertion) Mckenzie Hebert does feel that she gets out of breath more easily that she used to when she exercises. Mckenzie Hebert's shortness of breath appears to be obesity related and exercise induced. She has agreed to work on weight loss and gradually increase exercise to treat her exercise induced shortness of breath. Will continue to monitor closely. Check labs today.  - CBC with Differential/Platelet  3. Hyperlipidemia associated with type 2 diabetes mellitus ([place]) Cardiovascular risk and specific lipid/LDL goals reviewed.  We discussed several lifestyle modifications today and Mckenzie Hebert will continue to work on diet, exercise and weight loss efforts. Orders and follow up as documented in patient record.   Counseling Intensive lifestyle modifications are the first line treatment for this issue. Dietary changes: Increase soluble fiber. Decrease simple carbohydrates. Exercise changes: Moderate to vigorous-intensity aerobic activity 150 minutes per week if tolerated. Lipid-lowering medications: see documented in medical record. Check labs today.  - Comprehensive metabolic panel - Insulin, random  4. Type 2 diabetes mellitus with hyperglycemia, without long-term current use of insulin (HCC) Good blood sugar control is important to decrease the likelihood of diabetic complications such as nephropathy, neuropathy, limb loss, blindness, coronary artery disease, and death. Intensive lifestyle modification including diet, exercise and weight loss are the first line of treatment for diabetes. Check labs today.  5. Hypothyroidism, unspecified type Patient with long-standing  hypothyroidism, on levothyroxine therapy. She appears euthyroid. Orders and follow up as documented in patient record.  Counseling Good thyroid control is important for overall health. Supratherapeutic thyroid levels are dangerous and will not improve weight loss results. The correct way to take levothyroxine is fasting, with water, separated by at least 30 minutes from breakfast, and separated by more than 4 hours from calcium, iron, multivitamins, acid reflux medications (PPIs).  Check labs today.  - T4, free - T3  6. Vitamin D deficiency Low Vitamin D level contributes to fatigue and are associated with obesity, breast, and colon cancer. She agrees to continue to take OTC Vitamin D 4,000 IU daily and will follow-up for routine testing of Vitamin D, at least 2-3 times per year to avoid over-replacement. Repeat labs in March. Not at goal yet.  7. Palpitations Follow up with Dr. Gardiner Rhyme. Continue current meds.  8. Depression screening Mckenzie Hebert had a positive depression screening. Depression is commonly associated with obesity and often results in emotional eating behaviors. We will monitor this closely and work on CBT to help improve the non-hunger eating patterns. Referral to Psychology may be required if no improvement is seen as she continues in our clinic.  9. Obesity with current BMI of 39.4  Mckenzie Hebert is currently in the action stage of change and her goal is to continue with weight loss efforts. I recommend Mckenzie Hebert begin the structured treatment plan as  follows:  She has agreed to the Category 4 Plan.  Exercise goals: No exercise has been prescribed at this time.   Behavioral modification strategies: increasing lean protein intake, meal planning and cooking strategies, keeping healthy foods in the home, and planning for success.  She was informed of the importance of frequent follow-up visits to maximize her success with intensive lifestyle modifications for her multiple health  conditions. She was informed we would discuss her lab results at her next visit unless there is a critical issue that needs to be addressed sooner. Mckenzie Hebert agreed to keep her next visit at the agreed upon time to discuss these results.  Objective:   Blood pressure 117/83, pulse 61, temperature 98.2 F (36.8 C), height 5\' 7"  (1.702 m), weight 251 lb (113.9 kg), last menstrual period 03/18/2008, SpO2 96 %. Body mass index is 39.31 kg/m.  EKG: Normal sinus rhythm, rate 62 (07/04/2021).  Indirect Calorimeter completed today shows a VO2 of 291 and a [name] of 2002.  Her calculated basal metabolic rate is 9758 thus her basal metabolic rate is better than expected.  General: Cooperative, alert, well developed, in no acute distress. HEENT: Conjunctivae and lids unremarkable. Cardiovascular: Regular rhythm.  Lungs: Normal work of breathing. Neurologic: No focal deficits.   Lab Results  Component Value Date   CREATININE 0.70 09/17/2021   BUN 13 09/17/2021   NA 141 09/17/2021   K 4.5 09/17/2021   CL 100 09/17/2021   CO2 25 09/17/2021   Lab Results  Component Value Date   ALT 35 (H) 09/17/2021   AST 30 09/17/2021   ALKPHOS 66 09/17/2021   BILITOT 0.6 09/17/2021   Lab Results  Component Value Date   HGBA1C 6.1 (H) 07/29/2021   HGBA1C 5.7 (H) 01/28/2021   HGBA1C 5.7 (H) 11/05/2020   HGBA1C 5.9 (H) 07/26/2020   HGBA1C 6.3 (H) 02/12/2020   Lab Results  Component Value Date   INSULIN 9.7 09/17/2021   INSULIN 20.7 02/14/2020   Lab Results  Component Value Date   TSH 0.90 07/29/2021   Lab Results  Component Value Date   CHOL 124 07/29/2021   HDL 62 07/29/2021   LDLCALC 46 07/29/2021   TRIG 81 07/29/2021   CHOLHDL 2.0 07/29/2021   Lab Results  Component Value Date   WBC 5.2 09/17/2021   HGB 13.6 09/17/2021   HCT 39.6 09/17/2021   MCV 83 09/17/2021   PLT 239 09/17/2021   Lab Results  Component Value Date   IRON 60 01/14/2018   TIBC 398 01/14/2018   FERRITIN 22 01/14/2018     Attestation Statements:   Reviewed by clinician on day of visit: allergies, medications, problem list, medical history, surgical history, family history, social history, and previous encounter notes.  Time spent on visit including pre-visit chart review and post-visit charting and care was 45 minutes.   Coral Ceo, CMA, am acting as transcriptionist for Coralie Common, MD.   I have reviewed the above documentation for accuracy and completeness, and I agree with the above. - Coralie Common, MD

## 2021-10-01 ENCOUNTER — Encounter (INDEPENDENT_AMBULATORY_CARE_PROVIDER_SITE_OTHER): Payer: Self-pay | Admitting: Family Medicine

## 2021-10-01 ENCOUNTER — Other Ambulatory Visit: Payer: Self-pay

## 2021-10-01 ENCOUNTER — Ambulatory Visit (INDEPENDENT_AMBULATORY_CARE_PROVIDER_SITE_OTHER): Payer: Medicare HMO | Admitting: Family Medicine

## 2021-10-01 VITALS — BP 117/75 | HR 60 | Temp 97.8°F | Ht 67.0 in | Wt 246.0 lb

## 2021-10-01 DIAGNOSIS — E785 Hyperlipidemia, unspecified: Secondary | ICD-10-CM

## 2021-10-01 DIAGNOSIS — E038 Other specified hypothyroidism: Secondary | ICD-10-CM

## 2021-10-01 DIAGNOSIS — E559 Vitamin D deficiency, unspecified: Secondary | ICD-10-CM | POA: Diagnosis not present

## 2021-10-01 DIAGNOSIS — Z6838 Body mass index (BMI) 38.0-38.9, adult: Secondary | ICD-10-CM

## 2021-10-01 DIAGNOSIS — E1165 Type 2 diabetes mellitus with hyperglycemia: Secondary | ICD-10-CM | POA: Diagnosis not present

## 2021-10-01 DIAGNOSIS — E669 Obesity, unspecified: Secondary | ICD-10-CM

## 2021-10-01 DIAGNOSIS — E1169 Type 2 diabetes mellitus with other specified complication: Secondary | ICD-10-CM

## 2021-10-01 MED ORDER — VITAMIN D (ERGOCALCIFEROL) 1.25 MG (50000 UNIT) PO CAPS: 50000.0000 [IU] | ORAL_CAPSULE | ORAL | 0 refills | Status: DC

## 2021-10-02 NOTE — Progress Notes (Signed)
Chief Complaint:   OBESITY Mckenzie Hebert is here to discuss her progress with her obesity treatment plan along with follow-up of her obesity related diagnoses. Mckenzie Hebert is on the Category 4 Plan and states she is following her eating plan approximately 80% of the time. Mckenzie Hebert states she is not currently exercising.  Today's visit was #: 2 Starting weight: 251 lbs Starting date: 09/17/2021 Today's weight: 246 lbs Today's date: 10/01/2021 Total lbs lost to date: 5 Total lbs lost since last in-office visit: 5  Interim History: Pt did eat all food she had previously at home before getting on plan. She did experience constipation- had 4 days without a BM, and finally had one yesterday, but is still feeing bloated. Pt has a family member with brain cancer who just transitioned to hospice. Pt went out to eat twice. She notes no upcoming obstacles.   Subjective:   1. Type 2 diabetes mellitus with hyperglycemia, without long-term current use of insulin (Mckenzie Hebert) Discussed labs with patient today. Pt's last A1c was 6.1 with an insulin level of 9.7. She is not meds and is still eating some indulgent food with increased fatigue.  2. Vitamin D deficiency Discussed labs with patient today. Pt's Vit D level is 31. She is on OTC Vit D 4K IU daily.  3. Hyperlipidemia associated with type 2 diabetes mellitus (Mckenzie Hebert) Discussed labs with patient today. Pt is on Lipitor and Zetia. All cholesterol levels are within normal limits for DM.  4. Other specified hypothyroidism Discussed labs with patient today. Pt denies cold or hot intolerances or palpitations. She is on Synthroid 150 mcg daily.  Assessment/Plan:   1. Type 2 diabetes mellitus with hyperglycemia, without long-term current use of insulin (H[facility]) Good blood sugar control is important to decrease the likelihood of diabetic complications such as nephropathy, neuropathy, limb loss, blindness, coronary artery disease, and death. Intensive lifestyle  modification including diet, exercise and weight loss are the first line of treatment for diabetes. F/u labs in 3 months.  2. Vitamin D deficiency Low Vitamin D level contributes to fatigue and are associated with obesity, breast, and colon cancer. She agrees to increase to prescription Vitamin D 50,000 IU every week and will follow-up for routine testing of Vitamin D, at least 2-3 times per year to avoid over-replacement.  Start- Vitamin D, Ergocalciferol, (DRISDOL) 1.25 MG (50000 UNIT) CAPS capsule; Take 1 capsule (50,000 Units total) by mouth every 7 (seven) days.  Dispense: 4 capsule; Refill: 0  3. Hyperlipidemia associated with type 2 diabetes mellitus ([place]) Cardiovascular risk and specific lipid/LDL goals reviewed.  We discussed several lifestyle modifications today and Mckenzie Hebert will continue to work on diet, exercise and weight loss efforts. Orders and follow up as documented in patient record. Continue current treatment plan. Repeat labs in 3 months.  Counseling Intensive lifestyle modifications are the first line treatment for this issue. Dietary changes: Increase soluble fiber. Decrease simple carbohydrates. Exercise changes: Moderate to vigorous-intensity aerobic activity 150 minutes per week if tolerated. Lipid-lowering medications: see documented in medical record.  4. Other specified hypothyroidism Patient with long-standing hypothyroidism, on levothyroxine therapy. She appears euthyroid. Orders and follow up as documented in patient record. Continue Synthroid at current dose.  Counseling Good thyroid control is important for overall health. Supratherapeutic thyroid levels are dangerous and will not improve weight loss results. The correct way to take levothyroxine is fasting, with water, separated by at least 30 minutes from breakfast, and separated by more than 4 hours from calcium,  iron, multivitamins, acid reflux medications (PPIs).   5. Obesity with current BMI of 38.7 Mckenzie Hebert  is currently in the action stage of change. As such, her goal is to continue with weight loss efforts. She has agreed to the Category 4 Plan.   Exercise goals: No exercise has been prescribed at this time.  Behavioral modification strategies: increasing lean protein intake, meal planning and cooking strategies, keeping healthy foods in the home, and emotional eating strategies.  Mckenzie Hebert has agreed to follow-up with our clinic in 2-3 weeks. She was informed of the importance of frequent follow-up visits to maximize her success with intensive lifestyle modifications for her multiple health conditions.   Objective:   Blood pressure 117/75, pulse 60, temperature 97.8 F (36.6 C), height 5\' 7"  (1.702 m), weight 246 lb (111.6 kg), last menstrual period 03/18/2008, SpO2 96 %. Body mass index is 38.53 kg/m.  General: Cooperative, alert, well developed, in no acute distress. HEENT: Conjunctivae and lids unremarkable. Cardiovascular: Regular rhythm.  Lungs: Normal work of breathing. Neurologic: No focal deficits.   Lab Results  Component Value Date   CREATININE 0.70 09/17/2021   BUN 13 09/17/2021   NA 141 09/17/2021   K 4.5 09/17/2021   CL 100 09/17/2021   CO2 25 09/17/2021   Lab Results  Component Value Date   ALT 35 (H) 09/17/2021   AST 30 09/17/2021   ALKPHOS 66 09/17/2021   BILITOT 0.6 09/17/2021   Lab Results  Component Value Date   HGBA1C 6.1 (H) 07/29/2021   HGBA1C 5.7 (H) 01/28/2021   HGBA1C 5.7 (H) 11/05/2020   HGBA1C 5.9 (H) 07/26/2020   HGBA1C 6.3 (H) 02/12/2020   Lab Results  Component Value Date   INSULIN 9.7 09/17/2021   INSULIN 20.7 02/14/2020   Lab Results  Component Value Date   TSH 0.90 07/29/2021   Lab Results  Component Value Date   CHOL 124 07/29/2021   HDL 62 07/29/2021   LDLCALC 46 07/29/2021   TRIG 81 07/29/2021   CHOLHDL 2.0 07/29/2021   Lab Results  Component Value Date   VD25OH 31 07/29/2021   VD25OH 28 (L) 01/26/2019   VD25OH 24 (L)  01/10/2018   Lab Results  Component Value Date   WBC 5.2 09/17/2021   HGB 13.6 09/17/2021   HCT 39.6 09/17/2021   MCV 83 09/17/2021   PLT 239 09/17/2021   Lab Results  Component Value Date   IRON 60 01/14/2018   TIBC 398 01/14/2018   FERRITIN 22 01/14/2018    Attestation Statements:   Reviewed by clinician on day of visit: allergies, medications, problem list, medical history, surgical history, family history, social history, and previous encounter notes.  Coral Ceo, CMA, am acting as transcriptionist for Coralie Common, MD.   I have reviewed the above documentation for accuracy and completeness, and I agree with the above. - Coralie Common, MD

## 2021-10-14 ENCOUNTER — Telehealth: Payer: Self-pay | Admitting: Internal Medicine

## 2021-10-14 MED ORDER — LEVOTHYROXINE SODIUM 150 MCG PO TABS: ORAL_TABLET | ORAL | 1 refills | Status: DC

## 2021-10-14 NOTE — Telephone Encounter (Signed)
Medication sent.

## 2021-10-14 NOTE — Telephone Encounter (Signed)
Mckenzie Hebert [phone]  [place]  Mckenzie Hebert has changed pharmacies and needs below medication sent to new pharmacy below.  levothyroxine (SYNTHROID) 150 MCG tablet  [id number] [address] [place], [place] - [address] Phone:  [phone]  Fax:  [phone]

## 2021-10-16 ENCOUNTER — Ambulatory Visit (INDEPENDENT_AMBULATORY_CARE_PROVIDER_SITE_OTHER): Payer: Medicare HMO | Admitting: Adult Health

## 2021-10-16 ENCOUNTER — Encounter (INDEPENDENT_AMBULATORY_CARE_PROVIDER_SITE_OTHER): Payer: Self-pay | Admitting: Adult Health

## 2021-10-16 ENCOUNTER — Other Ambulatory Visit: Payer: Self-pay

## 2021-10-16 VITALS — BP 137/76 | HR 60 | Temp 98.0°F | Ht 67.0 in | Wt 247.0 lb

## 2021-10-16 DIAGNOSIS — Z6838 Body mass index (BMI) 38.0-38.9, adult: Secondary | ICD-10-CM | POA: Diagnosis not present

## 2021-10-16 DIAGNOSIS — F3289 Other specified depressive episodes: Secondary | ICD-10-CM

## 2021-10-16 DIAGNOSIS — E1165 Type 2 diabetes mellitus with hyperglycemia: Secondary | ICD-10-CM

## 2021-10-16 DIAGNOSIS — R69 Illness, unspecified: Secondary | ICD-10-CM | POA: Diagnosis not present

## 2021-10-16 DIAGNOSIS — I493 Ventricular premature depolarization: Secondary | ICD-10-CM | POA: Diagnosis not present

## 2021-10-16 DIAGNOSIS — E559 Vitamin D deficiency, unspecified: Secondary | ICD-10-CM | POA: Diagnosis not present

## 2021-10-16 DIAGNOSIS — E669 Obesity, unspecified: Secondary | ICD-10-CM

## 2021-10-16 DIAGNOSIS — Z6841 Body Mass Index (BMI) 40.0 and over, adult: Secondary | ICD-10-CM

## 2021-10-16 MED ORDER — VITAMIN D (ERGOCALCIFEROL) 1.25 MG (50000 UNIT) PO CAPS: 50000.0000 [IU] | ORAL_CAPSULE | ORAL | 0 refills | Status: DC
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
## 2021-10-20 DIAGNOSIS — F32A Depression, unspecified: Secondary | ICD-10-CM | POA: Insufficient documentation

## 2021-10-20 NOTE — Progress Notes (Signed)
Chief Complaint:   OBESITY Mckenzie Hebert is here to discuss her progress with her obesity treatment plan along with follow-up of her obesity related diagnoses. Mckenzie Hebert is on the Category 4 Plan and states she is following her eating plan approximately 30% of the time. Mckenzie Hebert states she is biking for 20 minutes 2 times per week.  Today's visit was #: 3 Starting weight: 251 lbs Starting date: 09/17/2021 Today's weight: 247 lbs Today's date: 10/16/2021 Total lbs lost to date: 4 lbs Total lbs lost since last in-office visit: 0  Interim History:  Mckenzie Hebert says her family members have brought her food-15 beans soup, beans with ham/potatoes. She is grateful for the meals, however they are not within Cat 4 prescribed meal plan.  She is the youngest of 5 children.   Of note - she is here for restart of HWW program.  Weight when she stopped program on 06/13/2020 - 237 pounds.  In April 2022 - left total knee replacement and is now resuming regular OVs with HWW.  Subjective:   1. Type 2 diabetes mellitus with hyperglycemia, without long-term current use of insulin ([place]) Metformin caused suicidal ideations - last dose 05/2017. Diet controlled - A1c 6.1 - at goal.  2. Vitamin D deficiency On 07/29/2021, vitamin D level  - 31 - well below goal of 50-70. She is currently taking prescription ergocalciferol 50,000 IU each week. She denies nausea, vomiting or muscle weakness.  3. PVC (premature ventricular contraction) Cardiology increased Toprol XL from 25 mg to 37.5 mg due to results from Holter study on 07/18/2021 - improvement in SVT burden and PVCs with frequent PAC.   4. Other depression, with emotional eating Triggers to increase eating - sister, work stress, stress with upcoming retirement. She denies SI/HI.  Assessment/Plan:   1. Type 2 diabetes mellitus with hyperglycemia, without long-term current use of insulin ([facility]) Continue Category 4 meal plan.  2. Vitamin D deficiency Refill  ergocalciferol 50,000 IU once weekly.  - Refill Vitamin D, Ergocalciferol, (DRISDOL) 1.25 MG (50000 UNIT) CAPS capsule; Take 1 capsule (50,000 Units total) by mouth every 7 (seven) days.  Dispense: 4 capsule; Refill: 0  3. PVC (premature ventricular contraction) Continue BB per Cardiology.  4. Other depression, with emotional eating Referral to Dr. Mallie Mussel.  5. Obesity with current BMI of 38.8  Mckenzie Hebert is currently in the action stage of change. As such, her goal is to continue with weight loss efforts. She has agreed to the Category 4 Plan.   Exercise goals:  As is.  Behavioral modification strategies: increasing lean protein intake, decreasing simple carbohydrates, meal planning and cooking strategies, keeping healthy foods in the home, dealing with family or coworker sabotage, and planning for success.  Mckenzie Hebert has agreed to follow-up with our clinic in 2 weeks. She was informed of the importance of frequent follow-up visits to maximize her success with intensive lifestyle modifications for her multiple health conditions.   Objective:   Blood pressure 137/76, Hebert 60, temperature 98 F (36.7 C), height 5\' 7"  (1.702 m), weight 247 lb (112 kg), last menstrual period 03/18/2008, SpO2 97 %. Body mass index is 38.69 kg/m.  General: Cooperative, alert, well developed, in no acute distress. HEENT: Conjunctivae and lids unremarkable. Cardiovascular: Regular rhythm.  Lungs: Normal work of breathing. Neurologic: No focal deficits.   Lab Results  Component Value Date   CREATININE 0.70 09/17/2021   BUN 13 09/17/2021   NA 141 09/17/2021   K 4.5 09/17/2021   CL  100 09/17/2021   CO2 25 09/17/2021   Lab Results  Component Value Date   ALT 35 (H) 09/17/2021   AST 30 09/17/2021   ALKPHOS 66 09/17/2021   BILITOT 0.6 09/17/2021   Lab Results  Component Value Date   HGBA1C 6.1 (H) 07/29/2021   HGBA1C 5.7 (H) 01/28/2021   HGBA1C 5.7 (H) 11/05/2020   HGBA1C 5.9 (H) 07/26/2020   HGBA1C  6.3 (H) 02/12/2020   Lab Results  Component Value Date   INSULIN 9.7 09/17/2021   INSULIN 20.7 02/14/2020   Lab Results  Component Value Date   TSH 0.90 07/29/2021   Lab Results  Component Value Date   CHOL 124 07/29/2021   HDL 62 07/29/2021   LDLCALC 46 07/29/2021   TRIG 81 07/29/2021   CHOLHDL 2.0 07/29/2021   Lab Results  Component Value Date   VD25OH 31 07/29/2021   VD25OH 28 (L) 01/26/2019   VD25OH 24 (L) 01/10/2018   Lab Results  Component Value Date   WBC 5.2 09/17/2021   HGB 13.6 09/17/2021   HCT 39.6 09/17/2021   MCV 83 09/17/2021   PLT 239 09/17/2021   Lab Results  Component Value Date   IRON 60 01/14/2018   TIBC 398 01/14/2018   FERRITIN 22 01/14/2018   Obesity Behavioral Intervention:   Approximately 15 minutes were spent on the discussion below.  ASK: We discussed the diagnosis of obesity with Mckenzie Hebert today and Yerlin agreed to give [place] permission to discuss obesity behavioral modification therapy today.  ASSESS: Mckenzie Hebert has the diagnosis of obesity and her BMI today is 38.8. Mckenzie Hebert is in the action stage of change.   ADVISE: Mckenzie Hebert was educated on the multiple health risks of obesity as well as the benefit of weight loss to improve her health. She was advised of the need for long term treatment and the importance of lifestyle modifications to improve her current health and to decrease her risk of future health problems.  AGREE: Multiple dietary modification options and treatment options were discussed and Mckenzie Hebert agreed to follow the recommendations documented in the above note.  ARRANGE: Mckenzie Hebert was educated on the importance of frequent visits to treat obesity as outlined per CMS and USPSTF guidelines and agreed to schedule her next follow up appointment today.  Attestation Statements:   Reviewed by clinician on day of visit: allergies, medications, problem list, medical history, surgical history, family history, social history, and previous encounter  notes.  I, Water quality scientist, CMA, am acting as Location manager for [name], NP.  I have reviewed the above documentation for accuracy and completeness, and I agree with the above. -  Bryker Fletchall d. Izzy Courville, NP-C

## 2021-10-21 ENCOUNTER — Telehealth (INDEPENDENT_AMBULATORY_CARE_PROVIDER_SITE_OTHER): Payer: Medicare HMO | Admitting: Psychology

## 2021-10-21 DIAGNOSIS — R69 Illness, unspecified: Secondary | ICD-10-CM | POA: Diagnosis not present

## 2021-10-21 DIAGNOSIS — F5089 Other specified eating disorder: Secondary | ICD-10-CM

## 2021-10-28 NOTE — Progress Notes (Incomplete)
?  Office: [phone]  /  Fax: [phone] ? ? ? ?Date: November 11, 2021   ?Appointment Start Time: *** ?Duration: *** minutes ?Provider: Glennie Isle, Psy.D. ?Type of Session: Individual Therapy  ?Location of Patient: {gbptloc:23249} ?Location of Provider: Provider's Home (private office) ?Type of Contact: Telepsychological Visit via MyChart Video Visit ? ?Session Content: Mckenzie Hebert is a 66 y.o. female presenting for a follow-up appointment to address the previously established treatment goal of increasing coping skills.Today's appointment was a telepsychological visit due to COVID-19. [name] provided verbal consent for today's telepsychological appointment and she is aware she is responsible for securing confidentiality on her end of the session. Prior to proceeding with today's appointment, Rosanna's physical location at the time of this appointment was obtained as well a phone number she could be reached at in the event of technical difficulties. [name] and this provider participated in today's telepsychological service.  ? ?This provider conducted a brief check-in. *** [name] was receptive to today's appointment as evidenced by openness to sharing, responsiveness to feedback, and {gbreceptiveness:23401}. ? ?Mental Status Examination:  ?Appearance: {Appearance:22431} ?Behavior: {Behavior:22445} ?Mood: {gbmood:21757} ?Affect: {Affect:22436} ?Speech: {Speech:22432} ?Eye Contact: {Eye Contact:22433} ?Psychomotor Activity: {Motor Activity:22434} ?Gait: {gbgait:23404} ?Thought Process: {thought process:22448}  ?Thought Content/Perception: {disturbances:22451} ?Orientation: {Orientation:22437} ?Memory/Concentration: {gbcognition:22449} ?Insight: {Insight:22446} ?Judgment: {Insight:22446} ? ?Interventions:  ?{Interventions for Progress Notes:23405} ? ?DSM-5 Diagnosis(es): F50.89 Other Specified Feeding or Eating Disorder, Emotional Eating Behaviors ? ?Treatment Goal & Progress: During the initial appointment with this provider,  the following treatment goal was established: increase coping skills. [name] has demonstrated progress in her goal as evidenced by {gbtxprogress:22839}. [name] also {gbtxprogress2:22951}. ? ?Plan: The next appointment will be scheduled in {gbweeks:21758}, which will be {gbtxmodality:23402}. The next session will focus on {Plan for Next Appointment:23400}. ? ?

## 2021-11-04 DIAGNOSIS — H25013 Cortical age-related cataract, bilateral: Secondary | ICD-10-CM | POA: Diagnosis not present

## 2021-11-04 DIAGNOSIS — H524 Presbyopia: Secondary | ICD-10-CM | POA: Diagnosis not present

## 2021-11-06 ENCOUNTER — Ambulatory Visit (INDEPENDENT_AMBULATORY_CARE_PROVIDER_SITE_OTHER): Payer: Medicare HMO | Admitting: Adult Health

## 2021-11-10 DIAGNOSIS — F411 Generalized anxiety disorder: Secondary | ICD-10-CM | POA: Diagnosis not present

## 2021-11-10 DIAGNOSIS — R69 Illness, unspecified: Secondary | ICD-10-CM | POA: Diagnosis not present

## 2021-11-11 ENCOUNTER — Telehealth (INDEPENDENT_AMBULATORY_CARE_PROVIDER_SITE_OTHER): Payer: Medicare HMO | Admitting: Psychology

## 2021-11-24 ENCOUNTER — Other Ambulatory Visit (INDEPENDENT_AMBULATORY_CARE_PROVIDER_SITE_OTHER): Payer: Self-pay | Admitting: Adult Health

## 2021-11-24 DIAGNOSIS — E559 Vitamin D deficiency, unspecified: Secondary | ICD-10-CM

## 2021-11-25 DIAGNOSIS — F411 Generalized anxiety disorder: Secondary | ICD-10-CM | POA: Diagnosis not present

## 2021-11-25 DIAGNOSIS — R69 Illness, unspecified: Secondary | ICD-10-CM | POA: Diagnosis not present

## 2021-11-25 NOTE — Telephone Encounter (Signed)
LAST APPOINTMENT DATE: 10/16/21 ?NEXT APPOINTMENT DATE: 01/29/22 ? ? ?[id number] [id number]?Phone: [phone]: [phone]? ?Patient is requesting a refill of the following medications: ?Pending Prescriptions:                       Disp   Refills ?  Vitamin D, Ergocalciferol, (DRISDOL) 1.25 *4 caps*0       ?Sig: Take 1 capsule (50,000 Units total) by mouth every 7 ?         (seven) days. ? ? ?Date last filled: 10/16/21 ?Previously prescribed by KValetta Fuller? ?Lab Results ?     Component                Value               Date                 ?     HGBA1C                   6.1 (H)             07/29/2021           ?     HGBA1C                   5.7 (H)             01/28/2021           ?     HGBA1C                   5.7 (H)             11/05/2020           ?Lab Results ?     Component                Value               Date                 ?     MICROALBUR               <0.2                07/29/2021           ?     [place]                 46                  07/29/2021           ?     CREATININE               0.70                09/17/2021           ?Lab Results ?     Component                Value               Date                 ?     VD25OH                   31  07/29/2021           ?     VD25OH                   28 (L)              01/26/2019           ?     VD25OH                   24 (L)              01/10/2018           ? ?BP Readings from Last 3 Encounters: ?10/16/21 : 137/76 ?10/01/21 : 117/75 ?09/17/21 : 117/83 ?

## 2021-12-08 ENCOUNTER — Ambulatory Visit (INDEPENDENT_AMBULATORY_CARE_PROVIDER_SITE_OTHER): Payer: Medicare HMO | Admitting: Nurse Practitioner

## 2021-12-21 ENCOUNTER — Other Ambulatory Visit (INDEPENDENT_AMBULATORY_CARE_PROVIDER_SITE_OTHER): Payer: Self-pay | Admitting: Adult Health

## 2021-12-21 DIAGNOSIS — E559 Vitamin D deficiency, unspecified: Secondary | ICD-10-CM

## 2021-12-24 ENCOUNTER — Encounter (INDEPENDENT_AMBULATORY_CARE_PROVIDER_SITE_OTHER): Payer: Self-pay | Admitting: Nurse Practitioner

## 2021-12-24 ENCOUNTER — Ambulatory Visit (INDEPENDENT_AMBULATORY_CARE_PROVIDER_SITE_OTHER): Payer: Medicare HMO | Admitting: Nurse Practitioner

## 2021-12-24 VITALS — BP 114/68 | HR 61 | Temp 97.8°F | Ht 67.0 in | Wt 256.0 lb

## 2021-12-24 DIAGNOSIS — E669 Obesity, unspecified: Secondary | ICD-10-CM | POA: Diagnosis not present

## 2021-12-24 DIAGNOSIS — Z6841 Body Mass Index (BMI) 40.0 and over, adult: Secondary | ICD-10-CM | POA: Diagnosis not present

## 2021-12-24 DIAGNOSIS — E1165 Type 2 diabetes mellitus with hyperglycemia: Secondary | ICD-10-CM | POA: Diagnosis not present

## 2021-12-24 DIAGNOSIS — E559 Vitamin D deficiency, unspecified: Secondary | ICD-10-CM | POA: Diagnosis not present

## 2021-12-24 DIAGNOSIS — I493 Ventricular premature depolarization: Secondary | ICD-10-CM

## 2021-12-26 ENCOUNTER — Telehealth: Payer: Self-pay | Admitting: Cardiology

## 2021-12-26 ENCOUNTER — Encounter: Payer: Self-pay | Admitting: Cardiology

## 2021-12-26 NOTE — Telephone Encounter (Signed)
Error

## 2021-12-26 NOTE — Telephone Encounter (Signed)
Ok

## 2021-12-26 NOTE — Telephone Encounter (Signed)
Patient is requesting a provider switch from Dr. Gardiner Rhyme to Dr. Percival Spanish due to Dr. Percival Spanish being her original cardiologist.  ?

## 2021-12-31 NOTE — Progress Notes (Signed)
? ? ? ?Chief Complaint:  ? ?OBESITY ?Mckenzie Hebert is here to discuss her progress with her obesity treatment plan along with follow-up of her obesity related diagnoses. Mckenzie Hebert is on the Category 4 Plan and states she is following her eating plan approximately 80% of the time. Mckenzie Hebert states she is cycling 20 minutes 3 times per week. ? ?Today's visit was #: 4 ?Starting weight: 251 lbs ?Starting date: 09/17/2021 ?Today's weight: 256 lbs ?Today's date: 12/24/2021 ?Total lbs lost to date: 0 ?Total lbs lost since last in-office visit: 0 ? ?Interim History: Mckenzie Hebert was last seen on 10/16/21. She does well with breakfast and lunch, but is struggling with snacking after dinner. She is snacking on Nutrigrain bars. Mckenzie Hebert was eating more carbs and pizza, but has gotten back on track over the past 2 weeks and feels that she is doing better. ? ?Subjective:  ? ?1. Type 2 diabetes mellitus with hyperglycemia, without long-term current use of insulin (Mckenzie Hebert) ?Mckenzie Hebert 's last A1c was 6.1. She has taken metformin in the past and stopped due to side effects. ? ?2. Vitamin D deficiency ?Mckenzie Hebert is taking vitamin D 50,000IU weekly. Her last vitamin D level is 31.0. She denies side effects, such as nausea, vomiting, or muscle weakness. ? ?3. PVC (premature ventricular contraction) ?Mckenzie Hebert is taking Toprol XL 37.'5mg'$ . She reports fatigue and weight gain since it was increased. ? ?Assessment/Plan:  ? ?1. Type 2 diabetes mellitus with hyperglycemia, without long-term current use of insulin (Mckenzie Hebert) ?Mckenzie Hebert plans to have labs rechecked with her PCP in May. ? ?2. Vitamin D deficiency ?Mckenzie Hebert would like to restart taking Vitamin D 4,000 IU daily. She plans to have labs rescheduled next month with her PCP. ? ?3. PVC (premature ventricular contraction) ?Mckenzie Hebert agrees to continue to follow up with Cardiology.  Continue meds as directed.   ? ?4. Obesity with current BMI of 40.2 ?Mckenzie Hebert is currently in the action stage of change. As such, her goal is to continue with weight  loss efforts. She has agreed to the Category 4 Plan.  ? ?Exercise goals:  As is. ? ?Behavioral modification strategies: increasing lean protein intake, increasing water intake, no skipping meals, and meal planning and cooking strategies. ? ?Mckenzie Hebert has agreed to follow-up with our clinic in 4 weeks. She was informed of the importance of frequent follow-up visits to maximize her success with intensive lifestyle modifications for her multiple health conditions.  ? ?Mckenzie Hebert was informed we would discuss her lab results at her next visit unless there is a critical issue that needs to be addressed sooner. Mckenzie Hebert agreed to keep her next visit at the agreed upon time to discuss these results. ? ?Objective:  ? ?Blood pressure 114/68, Hebert 61, temperature 97.8 ?F (36.6 ?C), height '5\' 7"'$  (1.702 m), weight 256 lb (116.1 kg), last menstrual period 03/18/2008, SpO2 96 %. ?Body mass index is 40.1 kg/m?. ? ?General: Cooperative, alert, well developed, in no acute distress. ?HEENT: Conjunctivae and lids unremarkable. ?Cardiovascular: Regular rhythm.  ?Lungs: Normal work of breathing. ?Neurologic: No focal deficits.  ? ?Lab Results  ?Component Value Date  ? CREATININE 0.70 09/17/2021  ? BUN 13 09/17/2021  ? NA 141 09/17/2021  ? K 4.5 09/17/2021  ? CL 100 09/17/2021  ? CO2 25 09/17/2021  ? ?Lab Results  ?Component Value Date  ? ALT 35 (H) 09/17/2021  ? AST 30 09/17/2021  ? ALKPHOS 66 09/17/2021  ? BILITOT 0.6 09/17/2021  ? ?Lab Results  ?Component Value Date  ? HGBA1C 6.1 (  H) 07/29/2021  ? HGBA1C 5.7 (H) 01/28/2021  ? HGBA1C 5.7 (H) 11/05/2020  ? HGBA1C 5.9 (H) 07/26/2020  ? HGBA1C 6.3 (H) 02/12/2020  ? ?Lab Results  ?Component Value Date  ? INSULIN 9.7 09/17/2021  ? INSULIN 20.7 02/14/2020  ? ?Lab Results  ?Component Value Date  ? TSH 0.90 07/29/2021  ? ?Lab Results  ?Component Value Date  ? CHOL 124 07/29/2021  ? HDL 62 07/29/2021  ? [place] 46 07/29/2021  ? TRIG 81 07/29/2021  ? CHOLHDL 2.0 07/29/2021  ? ?Lab Results  ?Component Value  Date  ? VD25OH 31 07/29/2021  ? VD25OH 28 (L) 01/26/2019  ? VD25OH 24 (L) 01/10/2018  ? ?Lab Results  ?Component Value Date  ? WBC 5.2 09/17/2021  ? HGB 13.6 09/17/2021  ? HCT 39.6 09/17/2021  ? MCV 83 09/17/2021  ? PLT 239 09/17/2021  ? ?Lab Results  ?Component Value Date  ? IRON 60 01/14/2018  ? TIBC 398 01/14/2018  ? FERRITIN 22 01/14/2018  ? ? ?Obesity Behavioral Intervention:  ? ?Approximately 15 minutes were spent on the discussion below. ? ?ASK: ?We discussed the diagnosis of obesity with Mckenzie Hebert today and Mckenzie Hebert agreed to give [place] permission to discuss obesity behavioral modification therapy today. ? ?ASSESS: ?Mckenzie Hebert has the diagnosis of obesity and her BMI today is 40.2. Mckenzie Hebert is in the action stage of change.  ? ?ADVISE: ?Mckenzie Hebert was educated on the multiple health risks of obesity as well as the benefit of weight loss to improve her health. She was advised of the need for long term treatment and the importance of lifestyle modifications to improve her current health and to decrease her risk of future health problems. ? ?AGREE: ?Multiple dietary modification options and treatment options were discussed and Mckenzie Hebert agreed to follow the recommendations documented in the above note. ? ?ARRANGE: ?Mckenzie Hebert was educated on the importance of frequent visits to treat obesity as outlined per CMS and USPSTF guidelines and agreed to schedule her next follow up appointment today. ? ?Attestation Statements:  ? ?Reviewed by clinician on day of visit: allergies, medications, problem list, medical history, surgical history, family history, social history, and previous encounter notes. ? ?Time spent on visit including pre-visit chart review and post-visit care and charting was 30 minutes.  ? ?I, Marcille Blanco, CMA, am acting as transcriptionist for Everardo Pacific, [place] ? ?I have reviewed the above documentation for accuracy and completeness, and I agree with the above. Everardo Pacific, FNP  ?

## 2022-01-01 ENCOUNTER — Encounter: Payer: Self-pay | Admitting: Internal Medicine

## 2022-01-01 ENCOUNTER — Ambulatory Visit (INDEPENDENT_AMBULATORY_CARE_PROVIDER_SITE_OTHER): Payer: Medicare HMO | Admitting: Internal Medicine

## 2022-01-01 VITALS — BP 108/68 | HR 68 | Temp 98.6°F

## 2022-01-01 DIAGNOSIS — E1169 Type 2 diabetes mellitus with other specified complication: Secondary | ICD-10-CM | POA: Diagnosis not present

## 2022-01-01 DIAGNOSIS — E785 Hyperlipidemia, unspecified: Secondary | ICD-10-CM

## 2022-01-01 DIAGNOSIS — E039 Hypothyroidism, unspecified: Secondary | ICD-10-CM

## 2022-01-01 DIAGNOSIS — U071 COVID-19: Secondary | ICD-10-CM | POA: Diagnosis not present

## 2022-01-01 LAB — POC COVID19 BINAXNOW: SARS Coronavirus 2 Ag: POSITIVE — AB

## 2022-01-01 MED ORDER — AZITHROMYCIN 250 MG PO TABS: ORAL_TABLET | ORAL | 0 refills | Status: AC

## 2022-01-01 MED ORDER — HYDROCODONE BIT-HOMATROP MBR 5-1.5 MG/5ML PO SOLN: 5.0000 mL | Freq: Four times a day (QID) | ORAL | 0 refills | Status: DC | PRN

## 2022-01-01 MED ORDER — METHYLPREDNISOLONE ACETATE 80 MG/ML IJ SUSP
80.0000 mg | Freq: Once | INTRAMUSCULAR | Status: AC
Start: 2022-01-01 — End: 2022-01-01
  Administered 2022-01-01: 80 mg via INTRAMUSCULAR

## 2022-01-01 NOTE — Progress Notes (Signed)
? ?  Subjective:  ? ? Patient ID: Mckenzie Hebert, female    DOB: [birth year], 66 y.o.   MRN: [id number] ? ?HPI 66 year old Female seen acutely today for respiratory infection symptoms.  Patient says [id number] has been going around her office.   Our records indicate she has had 2 COVID-19 vaccines in 2021.  Up until now, she has not contracted COVID-19 virus infection. ? ?Patient has been going to [facility] for weight loss.  She started in January 2023.  Thus far ,she has lost 5 pounds. ? ?She falls asleep in the early evening.  She wakes up around 3 AM to take medication.  At 4:30 AM she leaves home for work.  Starts work at 6 AM. ? ?Has developed respiratory congestion.  Has had cough and slight sore throat.  No documented fever or shaking chills. ? ?She had maxillary sinusitis at the time of her 38-monthrecheck in November 2022.  She has a history of PVCs and is followed by Dr. SGardiner Rhyme Cardiologist. ? ?She has a history of impaired glucose tolerance, hypothyroidism, hyperlipidemia as well as vitamin D deficiency.  History of GE reflux treated with PPI. ? ?Review of Systems denies shaking chills, nausea vomiting or headache.  Does have malaise and fatigue. ? ?   ?Objective:  ? Physical Exam ?Blood pressure 108/68 pulse 68 temperature 98.6 degrees pulse oximetry 96% ? ?Skin: Warm and dry.  Nodes none.  TMs are slightly dull bilaterally but not red.  Pharynx slightly injected.  Neck is supple.  Chest has occasional scattered inspiratory light wheezing without rales. ? ?Rapid COVID test is positive ? ? ? ?   ?Assessment & Plan:  ?Acute COVID-19 virus infection ? ?Plan: Patient has been taken out of work and may return on April 27.  We have discussed Paxlovid treatment versus antibiotic and cough medication.  We have decided to prescribe Hycodan 1 teaspoon every 6 hours as needed for cough and Zithromax Z-PAK 2 tablets day 1 followed by 1 tablet days 2 through 5.  Rest and drink plenty  of fluids.  Monitor pulse oximetry and walk some to prevent atelectasis.  Call if symptoms not improving or if symptoms worsen.  Patient was given Depo-Medrol 80 mg IM today for congestion. ? ?

## 2022-01-01 NOTE — Patient Instructions (Addendum)
Out of work until May 4th.  Rest and drink fluids.  Walk some to prevent atelectasis.  Take Zithromax Z-PAK 2 tabs day 1 followed by 1 tab days 2 through 5.  Hycodan 1 teaspoon every 6 hours if needed for cough.  Call if symptoms not improving or if symptoms worsen.  She was given Depo-Medrol 80 mg IM today for congestion. ?

## 2022-01-03 ENCOUNTER — Other Ambulatory Visit: Payer: Self-pay | Admitting: Cardiology

## 2022-01-06 ENCOUNTER — Telehealth: Payer: Self-pay | Admitting: Internal Medicine

## 2022-01-06 NOTE — Telephone Encounter (Signed)
Mckenzie Hebert is calling to say she is feeling some better. However she still hears a raspiness in her head and chest and mucus is a light brownish color. She is wandering if she needs any other medication and if she should go back to work on Thursday. She never did get cough medicine because drug store did not have it, so she took something her sister had.  ?

## 2022-01-07 NOTE — Telephone Encounter (Signed)
Talked with Shanicka she is going to hold off taking anything else right now, and see if she gets better. Will call back if she changes her mind, still has not got the cough medicine. As it stands now she is going to try to go back to work tomorrow. ?

## 2022-01-19 ENCOUNTER — Ambulatory Visit (INDEPENDENT_AMBULATORY_CARE_PROVIDER_SITE_OTHER): Payer: Medicare HMO | Admitting: Nurse Practitioner

## 2022-01-19 ENCOUNTER — Encounter (INDEPENDENT_AMBULATORY_CARE_PROVIDER_SITE_OTHER): Payer: Self-pay | Admitting: Nurse Practitioner

## 2022-01-19 VITALS — BP 117/60 | HR 60 | Temp 98.2°F | Ht 67.0 in | Wt 260.0 lb

## 2022-01-19 DIAGNOSIS — E1165 Type 2 diabetes mellitus with hyperglycemia: Secondary | ICD-10-CM

## 2022-01-19 DIAGNOSIS — E669 Obesity, unspecified: Secondary | ICD-10-CM

## 2022-01-19 DIAGNOSIS — Z6841 Body Mass Index (BMI) 40.0 and over, adult: Secondary | ICD-10-CM | POA: Diagnosis not present

## 2022-01-19 DIAGNOSIS — R748 Abnormal levels of other serum enzymes: Secondary | ICD-10-CM | POA: Diagnosis not present

## 2022-01-21 NOTE — Progress Notes (Signed)
? ? ? ?Chief Complaint:  ? ?OBESITY ?Mckenzie Hebert is here to discuss her progress with her obesity treatment plan along with follow-up of her obesity related diagnoses. Mckenzie Hebert is on the Category 4 Plan and states she is following her eating plan approximately 20% of the time. Mckenzie Hebert states she is doing 0 minutes 0 times per week. ? ?Today's visit was #: 5 ?Starting weight: 251 lbs ?Starting date: 09/17/2021 ?Today's weight: 260 lbs ?Today's date: 01/19/2022 ?Total lbs lost to date: 0 ?Total lbs lost since last in-office visit: 0 ? ?Interim History: Mckenzie Hebert had COVID since her last visit. She feels 95% better now. She has a residual dry cough. She hasn't been able to exercise since being diagnosed with COVID. She didn't feel well to follow the plan and got discouraged after getting COVID. Now since she is feeling better she plans to get back on track. She is drinking water daily. She is struggling drinking enough at work.  ? ?Subjective:  ? ?1. Type 2 diabetes mellitus with hyperglycemia, without long-term current use of insulin ([place]) ?Mckenzie Hebert's last A1C was 6.1. She is not currently on any medications. She took Metformin in the past and stopped due to side effects. She has never been on a GLP-1. ? ?2. Elevated liver enzymes ?Mckenzie Hebert's last ALT was 35. She denies abdominal pain.  ? ?Assessment/Plan:  ? ?1. Type 2 diabetes mellitus with hyperglycemia, without long-term current use of insulin ([place]) ?Mckenzie Hebert is seeing her primary care physician for follow up and labs this month. Good blood sugar control is important to decrease the likelihood of diabetic complications such as nephropathy, neuropathy, limb loss, blindness, coronary artery disease, and death. Intensive lifestyle modification including diet, exercise and weight loss are the first line of treatment for diabetes.  ? ?2. Elevated liver enzymes ?Mckenzie Hebert is seeing her primary care physician for follow up and labs this month. We discussed the likely diagnosis of non-alcoholic fatty  liver disease today and how this condition is obesity related. Mckenzie Hebert was educated the importance of weight loss. Mckenzie Hebert agreed to continue with her weight loss efforts with healthier diet and exercise as an essential part of her treatment plan.  ? ?3. Obesity with current BMI of 40.8 ?Mckenzie Hebert is currently in the action stage of change. As such, her goal is to continue with weight loss efforts. She has agreed to the Category 4 Plan.  ? ?Exercise goals:  Mckenzie Hebert will start exercising as tolerated.  ? ?Behavioral modification strategies: increasing lean protein intake, increasing water intake, and no skipping meals. ? ?Mckenzie Hebert has agreed to follow-up with our clinic in 4 weeks. She was informed of the importance of frequent follow-up visits to maximize her success with intensive lifestyle modifications for her multiple health conditions.  ? ?Objective:  ? ?Blood pressure 117/60, pulse 60, temperature 98.2 ?F (36.8 ?C), height '5\' 7"'$  (1.702 m), weight 260 lb (117.9 kg), last menstrual period 03/18/2008, SpO2 97 %. ?Body mass index is 40.72 kg/m?. ? ?General: Cooperative, alert, well developed, in no acute distress. ?HEENT: Conjunctivae and lids unremarkable. ?Cardiovascular: Regular rhythm.  ?Lungs: Normal work of breathing. ?Neurologic: No focal deficits.  ? ?Lab Results  ?Component Value Date  ? CREATININE 0.70 09/17/2021  ? BUN 13 09/17/2021  ? NA 141 09/17/2021  ? K 4.5 09/17/2021  ? CL 100 09/17/2021  ? CO2 25 09/17/2021  ? ?Lab Results  ?Component Value Date  ? ALT 35 (H) 09/17/2021  ? AST 30 09/17/2021  ? ALKPHOS 66 09/17/2021  ?  BILITOT 0.6 09/17/2021  ? ?Lab Results  ?Component Value Date  ? HGBA1C 6.1 (H) 07/29/2021  ? HGBA1C 5.7 (H) 01/28/2021  ? HGBA1C 5.7 (H) 11/05/2020  ? HGBA1C 5.9 (H) 07/26/2020  ? HGBA1C 6.3 (H) 02/12/2020  ? ?Lab Results  ?Component Value Date  ? INSULIN 9.7 09/17/2021  ? INSULIN 20.7 02/14/2020  ? ?Lab Results  ?Component Value Date  ? TSH 0.90 07/29/2021  ? ?Lab Results  ?Component Value  Date  ? CHOL 124 07/29/2021  ? HDL 62 07/29/2021  ? [place] 46 07/29/2021  ? TRIG 81 07/29/2021  ? CHOLHDL 2.0 07/29/2021  ? ?Lab Results  ?Component Value Date  ? VD25OH 31 07/29/2021  ? VD25OH 28 (L) 01/26/2019  ? VD25OH 24 (L) 01/10/2018  ? ?Lab Results  ?Component Value Date  ? WBC 5.2 09/17/2021  ? HGB 13.6 09/17/2021  ? HCT 39.6 09/17/2021  ? MCV 83 09/17/2021  ? PLT 239 09/17/2021  ? ?Lab Results  ?Component Value Date  ? IRON 60 01/14/2018  ? TIBC 398 01/14/2018  ? FERRITIN 22 01/14/2018  ? ?Attestation Statements:  ? ?Reviewed by clinician on day of visit: allergies, medications, problem list, medical history, surgical history, family history, social history, and previous encounter notes. ? ?Time spent on visit including pre-visit chart review and post-visit care and charting was 30 minutes.  ? ?I, Lizbeth Bark, RMA, am acting as Location manager for Everardo Pacific, FNP.  ? ?I have reviewed the above documentation for accuracy and completeness, and I agree with the above. Everardo Pacific, FNP  ?

## 2022-01-23 ENCOUNTER — Encounter: Payer: Self-pay | Admitting: Internal Medicine

## 2022-01-23 ENCOUNTER — Ambulatory Visit (INDEPENDENT_AMBULATORY_CARE_PROVIDER_SITE_OTHER): Payer: Medicare HMO | Admitting: Internal Medicine

## 2022-01-23 ENCOUNTER — Telehealth: Payer: Self-pay

## 2022-01-23 ENCOUNTER — Ambulatory Visit
Admission: RE | Admit: 2022-01-23 | Discharge: 2022-01-23 | Disposition: A | Discharge status: Home / Self Care | Payer: Self-pay | Source: Ambulatory Visit | Attending: Internal Medicine | Admitting: Internal Medicine

## 2022-01-23 VITALS — BP 118/72 | HR 67 | Temp 98.5°F

## 2022-01-23 DIAGNOSIS — R059 Cough, unspecified: Secondary | ICD-10-CM

## 2022-01-23 DIAGNOSIS — U071 COVID-19: Secondary | ICD-10-CM

## 2022-01-23 DIAGNOSIS — J22 Unspecified acute lower respiratory infection: Secondary | ICD-10-CM

## 2022-01-23 DIAGNOSIS — J4531 Mild persistent asthma with (acute) exacerbation: Secondary | ICD-10-CM | POA: Diagnosis not present

## 2022-01-23 MED ORDER — AZITHROMYCIN 250 MG PO TABS: ORAL_TABLET | ORAL | 0 refills | Status: AC

## 2022-01-23 MED ORDER — METHYLPREDNISOLONE ACETATE 80 MG/ML IJ SUSP
80.0000 mg | Freq: Once | INTRAMUSCULAR | Status: AC
  Administered 2022-01-23: 80 mg via INTRAMUSCULAR

## 2022-01-23 MED ORDER — ALBUTEROL SULFATE (2.5 MG/3ML) 0.083% IN NEBU
2.5000 mg | INHALATION_SOLUTION | Freq: Once | RESPIRATORY_TRACT | Status: AC
Start: 2022-01-23 — End: 2022-01-23
  Administered 2022-01-23: 2.5 mg via RESPIRATORY_TRACT

## 2022-01-23 MED ORDER — CEFTRIAXONE SODIUM 1 G IJ SOLR
1.0000 g | Freq: Once | INTRAMUSCULAR | Status: AC
  Administered 2022-01-23: 1 g via INTRAMUSCULAR

## 2022-01-23 MED ORDER — BENZONATATE 100 MG PO CAPS: 100.0000 mg | ORAL_CAPSULE | Freq: Three times a day (TID) | ORAL | 0 refills | Status: DC | PRN

## 2022-01-23 NOTE — Telephone Encounter (Addendum)
Mckenzie Hebert called to say she has a light brown cloudy mucus now now, and a rattling in her chest. Scheduled her to be seen at 2:30 today when she gets off work. Just has not gotten over COVID from April

## 2022-01-23 NOTE — Telephone Encounter (Signed)
Kendra from GI called with results on chest Xray: No active cardiopulmonary disease. Patient has been notified along with Dr Renold Genta.

## 2022-01-23 NOTE — Progress Notes (Signed)
   Subjective:    Patient ID: Mckenzie Hebert, female    DOB: 1956/05/21, 66 y.o.   MRN: [id number]  HPI 66 year old Female seen today for persistent cough.  She was diagnosed on April 27 with acute COVID-19 virus infection.  Several colleagues in the office and had COVID-19.  Up until now she had never contracted COVID-19 virus infection.  I records indicate she had 2 COVID-19 vaccines in 2021.  Patient has fatigue and persistent cough.  She started taking prednisone this morning that she had leftover from another prescription.  She has a history of obesity, impaired glucose tolerance, hypothyroidism, hyperlipidemia, GE reflux and vitamin D deficiency.  History of PVCs and followed by [facility].    Review of Systems light brown sputum production and rattling in her chest.     Objective:   Physical Exam Blood pressure 118/72 pulse 67 temperature 98.5 degrees pulse oximetry 96% on room air. She looks fatigued.  Slightly tachypneic.  TMs are clear.  Pharynx is slightly injected.  Neck supple. Chest reveals no distinct rales but cannot exclude occult pneumonia     Assessment & Plan:  History of COVID-19 virus infection with protracted cough and congestion  Type 2 diabetes mellitus-has upcoming physical exam and hemoglobin A1c will be checked in late May  Acute lower respiratory infection likely secondary bacterial infection from COVID-19  Reactive airways disease  Plan: Was given 1 g IM Rocephin in the office and started on Zithromax Z-PAK 2 tabs day 1 followed by 1 tab days 2 through 5.  May take Tessalon Perles 100 mg 3 times a day as needed for cough and use albuterol nebulizer treatment as needed.  Depo-Medrol 80 mg IM.  Chest x-ray obtained and result is negative.

## 2022-01-29 ENCOUNTER — Other Ambulatory Visit: Payer: Medicare HMO

## 2022-01-29 DIAGNOSIS — E1169 Type 2 diabetes mellitus with other specified complication: Secondary | ICD-10-CM | POA: Diagnosis not present

## 2022-01-29 DIAGNOSIS — E559 Vitamin D deficiency, unspecified: Secondary | ICD-10-CM | POA: Diagnosis not present

## 2022-01-29 DIAGNOSIS — E039 Hypothyroidism, unspecified: Secondary | ICD-10-CM | POA: Diagnosis not present

## 2022-01-29 DIAGNOSIS — F419 Anxiety disorder, unspecified: Secondary | ICD-10-CM | POA: Diagnosis not present

## 2022-01-29 DIAGNOSIS — E1165 Type 2 diabetes mellitus with hyperglycemia: Secondary | ICD-10-CM

## 2022-01-29 DIAGNOSIS — R69 Illness, unspecified: Secondary | ICD-10-CM | POA: Diagnosis not present

## 2022-01-29 DIAGNOSIS — E785 Hyperlipidemia, unspecified: Secondary | ICD-10-CM | POA: Diagnosis not present

## 2022-01-30 LAB — COMPLETE METABOLIC PANEL WITH GFR
AG Ratio: 1.6 (calc) (ref 1.0–2.5)
ALT: 61 U/L — ABNORMAL HIGH (ref 6–29)
AST: 32 U/L (ref 10–35)
Albumin: 4.2 g/dL (ref 3.6–5.1)
Alkaline phosphatase (APISO): 61 U/L (ref 37–153)
BUN: 15 mg/dL (ref 7–25)
CO2: 29 mmol/L (ref 20–32)
Calcium: 9.1 mg/dL (ref 8.6–10.4)
Chloride: 102 mmol/L (ref 98–110)
Creat: 0.69 mg/dL (ref 0.50–1.05)
Globulin: 2.6 g/dL (calc) (ref 1.9–3.7)
Glucose, Bld: 100 mg/dL — ABNORMAL HIGH (ref 65–99)
Potassium: 4.7 mmol/L (ref 3.5–5.3)
Sodium: 139 mmol/L (ref 135–146)
Total Bilirubin: 0.5 mg/dL (ref 0.2–1.2)
Total Protein: 6.8 g/dL (ref 6.1–8.1)
eGFR: 96 mL/min/{1.73_m2} (ref >=60)

## 2022-01-30 LAB — CBC WITH DIFFERENTIAL/PLATELET
Absolute Monocytes: 445 cells/uL (ref 200–950)
Basophils Absolute: 22 cells/uL (ref 0–200)
Basophils Relative: 0.3 %
Eosinophils Absolute: 292 cells/uL (ref 15–500)
Eosinophils Relative: 4 %
HCT: 41.7 % (ref 35.0–45.0)
Hemoglobin: 13.5 g/dL (ref 11.7–15.5)
Lymphs Abs: 1891 cells/uL (ref 850–3900)
MCH: 27.7 pg (ref 27.0–33.0)
MCHC: 32.4 g/dL (ref 32.0–36.0)
MCV: 85.6 fL (ref 80.0–100.0)
MPV: 9.7 fL (ref 7.5–12.5)
Monocytes Relative: 6.1 %
Neutro Abs: 4650 cells/uL (ref 1500–7800)
Neutrophils Relative %: 63.7 %
Platelets: 255 10*3/uL (ref 140–400)
RBC: 4.87 10*6/uL (ref 3.80–5.10)
RDW: 13.2 % (ref 11.0–15.0)
Total Lymphocyte: 25.9 %
WBC: 7.3 10*3/uL (ref 3.8–10.8)

## 2022-01-30 LAB — LIPID PANEL
Cholesterol: 131 mg/dL (ref <200)
HDL: 74 mg/dL (ref >=50)
LDL Cholesterol (Calc): 41 mg/dL (calc)
Non-HDL Cholesterol (Calc): 57 mg/dL (calc) (ref <130)
Total CHOL/HDL Ratio: 1.8 (calc) (ref <5.0)
Triglycerides: 75 mg/dL (ref <150)

## 2022-01-30 LAB — HEMOGLOBIN A1C
Hgb A1c MFr Bld: 6.3 % of total Hgb — ABNORMAL HIGH (ref <5.7)
Mean Plasma Glucose: 134 mg/dL
eAG (mmol/L): 7.4 mmol/L

## 2022-01-30 LAB — VITAMIN D 25 HYDROXY (VIT D DEFICIENCY, FRACTURES): Vit D, 25-Hydroxy: 42 ng/mL (ref 30–100)

## 2022-01-30 LAB — MICROALBUMIN, URINE: Microalb, Ur: <0.2 mg/dL

## 2022-01-30 LAB — TSH: TSH: 2.25 mIU/L (ref 0.40–4.50)

## 2022-02-03 ENCOUNTER — Ambulatory Visit (INDEPENDENT_AMBULATORY_CARE_PROVIDER_SITE_OTHER): Payer: Medicare HMO | Admitting: Internal Medicine

## 2022-02-03 ENCOUNTER — Encounter: Payer: Self-pay | Admitting: Internal Medicine

## 2022-02-03 VITALS — BP 110/72 | HR 61 | Temp 97.8°F | Ht 66.5 in | Wt 262.0 lb

## 2022-02-03 DIAGNOSIS — E039 Hypothyroidism, unspecified: Secondary | ICD-10-CM | POA: Diagnosis not present

## 2022-02-03 DIAGNOSIS — Z0000 Encounter for general adult medical examination without abnormal findings: Secondary | ICD-10-CM | POA: Diagnosis not present

## 2022-02-03 DIAGNOSIS — I493 Ventricular premature depolarization: Secondary | ICD-10-CM | POA: Diagnosis not present

## 2022-02-03 DIAGNOSIS — E1165 Type 2 diabetes mellitus with hyperglycemia: Secondary | ICD-10-CM

## 2022-02-03 DIAGNOSIS — E785 Hyperlipidemia, unspecified: Secondary | ICD-10-CM | POA: Diagnosis not present

## 2022-02-03 DIAGNOSIS — Z96651 Presence of right artificial knee joint: Secondary | ICD-10-CM | POA: Diagnosis not present

## 2022-02-03 DIAGNOSIS — R69 Illness, unspecified: Secondary | ICD-10-CM | POA: Diagnosis not present

## 2022-02-03 DIAGNOSIS — F419 Anxiety disorder, unspecified: Secondary | ICD-10-CM

## 2022-02-03 DIAGNOSIS — E1169 Type 2 diabetes mellitus with other specified complication: Secondary | ICD-10-CM

## 2022-02-03 LAB — POCT URINALYSIS DIPSTICK
Bilirubin, UA: NEGATIVE
Blood, UA: NEGATIVE
Glucose, UA: NEGATIVE
Ketones, UA: NEGATIVE
Leukocytes, UA: NEGATIVE
Nitrite, UA: NEGATIVE
Protein, UA: NEGATIVE
Spec Grav, UA: <=1.005 — AB (ref 1.010–1.025)
Urobilinogen, UA: 0.2 E.U./dL
pH, UA: 5 (ref 5.0–8.0)

## 2022-02-03 NOTE — Progress Notes (Addendum)
Subjective:    Patient ID: Mckenzie Hebert, female    DOB: [birth year], 66 y.o.   MRN: [id number]  HPI 66 year old Female seen for Welcome to Medicare exam. Recently had Covid-19 and was on steroids for respiratory congestion. Hgb AIC is 6.3%.  History of left knee arthroplasty by Dr. Reynaldo Minium April 2022.  History of hysteroscopic resection of endometrial polyp and leiomyoma October 2019 by Dr. Phineas Real.  History of asthma, eczema, hyperplastic colon polyps, insomnia, anxiety, urticaria, history of migraine headaches, hypothyroidism, vitamin D deficiency, GE reflux, obesity, impaired glucose tolerance.  History of Chiari I malformation.  History of functional constipation.  She had H. pylori in 2009.  Angioedema of the lip 2010.  Herpes zoster 2011.  Had colonoscopy done in 2017.  Had laparoscopic appendectomy 1995.  History of asthma treated with Advair and Singulair.  History of palpitations and has had cardiac evaluation spring 2022.  Had near syncope episode March 2022.  Saw cardiologist and had normal Myoview study.  Monitor that she wore for 14 days showed frequent PVCs, frequent PACs and 31 episodes of SVT with the longest lasting 14 seconds.  Recent echocardiogram showed grade 1 diastolic dysfunction.  History of mild elevation of LDL.  Cardiologist is not sure what caused her syncope but she has been started as of last year follow-up metoprolol 25 mg twice daily for dysrhythmia.  She is on Zetia 10 mg daily.  Sleep study recommended for daytime somnolence.  Social history: She is a widow.  Husband died of heart problems.  No children.  She smoked until the age of 34 and then quit.  No alcohol consumption.  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  Resides alone.  Lives in the [place] area.  Para  Family history: Parents are deceased.  Mother died at age 45 with history of hypertension, diabetes, coronary disease, MI s/p CABG.  Father died in his 44s  with history of lung cancer.  2 sisters.  1 sister with history of diabetes and hypertension.  2 brothers.    Review of Systems denies chest pain, abdominal pain, has some fatigue status post COVID     Objective:   Physical Exam Blood pressure is excellent at 110/72, pulse 61, temperature 97.8 degrees, weight 262 pounds BMI 41.65 Skin: Warm and dry.  No cervical adenopathy or thyromegaly.  TMs clear.  Chest clear.  Cardiac exam: Occasional extrasystoles.  Mostly regular rate and rhythm.  Breasts are without masses.  Abdomen is obese soft nondistended without hepatosplenomegaly masses or  tenderness.  No pitting edema of the lower extremities.  Affect thought and judgment are normal.  Brief neurological exam is intact without focal deficits.      Assessment & Plan:  BMI 41.65-she will try to work on diet exercise and weight loss.  Not interested in gastric bypass surgery.  Cardiac dysrhythmia-continue metoprolol 25 mg daily.  This appears to be stable and she is followed by Cardiology  History of COVID-19-slowly improving  GE reflux treated with PPI-Protonix 40 mg daily  History of asthma treated with  albuterol  History of migraine headaches  History of constipation  Impaired glucose tolerance-hemoglobin A1c 6.3% and will be reassessed in 6 months.  May need additional medication for glucose control  Hyperlipidemia treated with Crestor 10 mg daily and Zetia per cardiologist  Hypothyroidism-treated with thyroid replacement medication and stable  Plan: Follow-up in 6 months.  Work on diet exercise and weight loss.  Continue current  medications.  Fatigue from COVID will likely improve within a few weeks.

## 2022-02-03 NOTE — Progress Notes (Unsigned)
     Annual Wellness Visit     Patient: Mckenzie Hebert, Female    DOB: 1956-03-31, 66 y.o.   MRN: [id number] Visit Date: 02/03/2022  Chief Complaint  Patient presents with   [facility]   Annual Exam   Subjective    Mckenzie Hebert is a 66 y.o. female who presents today for her Annual Wellness Visit.  HPI   Social History   Social History Narrative   Lives alone.      Patient Care Team: Elby Showers, MD as PCP - General (Internal Medicine)  Review of Systems   Objective    Vitals: BP 110/72   Pulse 61   Temp 97.8 F (36.6 C) (Tympanic)   Ht 5' 6.5" (1.689 m)   Wt 262 lb (118.8 kg)   LMP 03/18/2008 (LMP Unknown)   SpO2 97%   BMI 41.65 kg/m   Physical Exam   Most recent functional status assessment:    02/03/2022   10:11 AM  In your present state of health, do you have any difficulty performing the following activities:  Hearing? 0  Vision? 0  Difficulty concentrating or making decisions? 0  Walking or climbing stairs? 0  Dressing or bathing? 0  Doing errands, shopping? 0  Preparing Food and eating ? N  Using the Toilet? N  In the past six months, have you accidently leaked urine? N  Do you have problems with loss of bowel control? N  Managing your Medications? N  Managing your Finances? N  Housekeeping or managing your Housekeeping? N   Most recent fall risk assessment:    02/03/2022   10:11 AM  Fall Risk   Falls in the past year? 0  Number falls in past yr: 0  Injury with Fall? 0  Risk for fall due to : No Fall Risks  Follow up Falls evaluation completed    Most recent depression screenings:    02/03/2022   10:11 AM 09/17/2021    9:01 AM  PHQ 2/9 Scores  PHQ - 2 Score 0 6  PHQ- 9 Score  20   Most recent cognitive screening:    02/03/2022   10:12 AM  6CIT Screen  What Year? 0 points  What month? 0 points  What time? 0 points  Count back from 20 0 points  Months in reverse 0 points  Repeat phrase 0 points   Total Score 0 points       Assessment & Plan     Annual wellness visit done today including the all of the following: Reviewed patient's Family Medical History Reviewed and updated list of patient's medical providers Assessment of cognitive impairment was done Assessed patient's functional ability Established a written schedule for health screening [place] Completed and Reviewed  Discussed health benefits of physical activity, and encouraged her to engage in regular exercise appropriate for her age and condition.         {provider attestation***:1}   Angus Seller, CMA

## 2022-02-03 NOTE — Patient Instructions (Signed)
RTC in 3 months for follow up. Discussed Prevnar 20, tetanus u[date and Shingrix vaccine to be considered. Has elevated LFTs but had Covid recently and was on steroids. Hgb AIC up as well. Recheck in 3 months. Do not feel she is extremely depressed just tired from having Covid-19.

## 2022-02-03 NOTE — Patient Instructions (Addendum)
Chest x-ray is negative.  Given 1 g IM Rocephin in the office and started on Zithromax Z-PAK.  May take Tessalon Perles up to 3 times daily as needed for cough and use albuterol nebulizer treatment as needed.  Depo-Medrol 80 mg IM.

## 2022-02-05 ENCOUNTER — Other Ambulatory Visit: Payer: BC Managed Care – PPO | Admitting: Internal Medicine

## 2022-02-06 ENCOUNTER — Ambulatory Visit: Payer: BC Managed Care – PPO | Admitting: Internal Medicine

## 2022-02-06 DIAGNOSIS — M25561 Pain in right knee: Secondary | ICD-10-CM | POA: Diagnosis not present

## 2022-02-06 DIAGNOSIS — Z96652 Presence of left artificial knee joint: Secondary | ICD-10-CM | POA: Diagnosis not present

## 2022-02-18 ENCOUNTER — Other Ambulatory Visit: Payer: Self-pay | Admitting: Internal Medicine

## 2022-02-19 ENCOUNTER — Ambulatory Visit (INDEPENDENT_AMBULATORY_CARE_PROVIDER_SITE_OTHER): Payer: Medicare HMO | Admitting: Cardiology

## 2022-02-19 ENCOUNTER — Encounter: Payer: Self-pay | Admitting: Cardiology

## 2022-02-19 VITALS — BP 110/80 | HR 64 | Ht 67.0 in | Wt 265.4 lb

## 2022-02-19 DIAGNOSIS — R55 Syncope and collapse: Secondary | ICD-10-CM

## 2022-02-19 DIAGNOSIS — I491 Atrial premature depolarization: Secondary | ICD-10-CM

## 2022-02-19 DIAGNOSIS — I493 Ventricular premature depolarization: Secondary | ICD-10-CM

## 2022-02-19 DIAGNOSIS — I77819 Aortic ectasia, unspecified site: Secondary | ICD-10-CM | POA: Diagnosis not present

## 2022-02-19 DIAGNOSIS — I2510 Atherosclerotic heart disease of native coronary artery without angina pectoris: Secondary | ICD-10-CM

## 2022-02-19 MED ORDER — METOPROLOL SUCCINATE ER 25 MG PO TB24: 25.0000 mg | ORAL_TABLET | Freq: Every day | ORAL | 3 refills | Status: DC

## 2022-02-19 NOTE — Patient Instructions (Signed)
Medication Instructions:  DECREASE metoprolol to 25 mg daily  *If you need a refill on your cardiac medications before your next appointment, please call your pharmacy*  Follow-Up: At [facility], you and your health needs are our priority.  As part of our continuing mission to provide you with exceptional heart care, we have created designated Provider Care Teams.  These Care Teams include your primary Cardiologist (physician) and Advanced Practice Providers (APPs -  Physician Assistants and Nurse Practitioners) who all work together to provide you with the care you need, when you need it.  We recommend signing up for the patient portal called "MyChart".  Sign up information is provided on this After Visit Summary.  MyChart is used to connect with patients for Virtual Visits (Telemedicine).  Patients are able to view lab/test results, encounter notes, upcoming appointments, etc.  Non-urgent messages can be sent to your provider as well.   To learn more about what you can do with MyChart, go to [email or web address].    Your next appointment:   6 month(s)  The format for your next appointment:   In Person  Provider:   Dr. Gardiner Rhyme  Important Information About Sugar

## 2022-02-19 NOTE — Addendum Note (Signed)
Addended by: Patria Mane A on: 02/19/2022 04:01 PM   Modules accepted: Orders

## 2022-02-19 NOTE — Progress Notes (Signed)
[facility] Office Note:    Date:  02/19/2022   ID:  Mckenzie Hebert, DOB 1956-01-03, MRN [id number]  PCP:  Mckenzie Showers, MD  Cardiologist:  None  Electrophysiologist:  None   Referring MD: Mckenzie Showers, MD   No chief complaint on file.    History of Present Illness:    Mckenzie Hebert is a 66 y.o. female with a hx of hypothyroidism, prediabetes, frequent PACs/PVCs who presents for follow-up.  She was referred by Dr. Renold Hebert for evaluation of near syncopal episode, initially seen on 11/15/2020.  She works as a Sports coach and reports that on 10/10/2020 she was sitting down and felt lightheaded.  She denies any chest pain, dyspnea, palpitations.  States that she felt she was going to pass out, but turned her fan on and started taking deep breaths and symptoms improved.  She reports she lost 50 pounds from June to November last year.  Reports occasional chest pain, which she states occurs a couple times per year and describes as dull aching pain in the center of her chest that can last up to 30 minutes.  She denies any exertional chest pain.  States that she can walk up a flight of stairs without stopping, denies any chest pain or shortness of breath with this.  She smoked in her teens and 69s.  Family history includes mother had CABG and CHF, died during a nuclear stress test.  Lexiscan Myoview on 10/11/2015 showed normal perfusion, EF 64%.  Echocardiogram on 11/29/2020 showed normal biventricular function, no significant valvular disease.  Zio patch x14 days on 01/30/2021 showed frequent PVCs (10% of beats), frequent PACs (10%), 31 episodes of SVT with longest lasting 14 seconds.  Calcium score 353 (94th percentile) on 01/30/2021.  Repeat Zio patch x3 days on metoprolol showed 4 episodes of SVT (longest lasting 7 beats), frequent PACs (15% of beats), occasional PVCs (4% of beats).  Since last clinic visit, she reports that she is doing okay.  Denies any further syncope or near syncopal  episodes.  Has had some lightheadedness, particularly when he had COVID-19 infection in April.  She was treated with steroids and also took a course of antibiotics.  Reports dyspnea has resolved.  She denies any chest pain, lower extremity edema, or palpitations.  She has not been exercising.  Reports has been feeling significantly fatigued since starting metoprolol in particular since dose was increased to 37.5 mg daily.     Wt Readings from Last 3 Encounters:  02/19/22 265 lb 6.4 oz (120.4 kg)  02/03/22 262 lb (118.8 kg)  01/19/22 260 lb (117.9 kg)       Past Medical History:  Diagnosis Date   AC (acromioclavicular) joint bone spurs 2008   right foot   Acid reflux    Anxiety    Asthma    Back pain    Back pain    Bilateral swelling of feet    Constipation    Dyspnea    Dysrhythmia 2019   PVC   Endometrial polyp    Endometrial polyp    Heartburn    History of colon polyps    Hypothyroidism    Joint pain    Joint pain    Migraines    Dr Mckenzie Hebert   Osteoarthritis    PAC (premature atrial contraction)    Palpitations    cardiologist-  dr hochrein-- normal myoview 2017, holter 2015 mild ectopy   PONV (postoperative nausea and vomiting)    Pre-diabetes  2019   PVC (premature ventricular contraction)    SOB (shortness of breath)    Thyroid disease    Uterine fibroid    Vitamin D deficiency     Past Surgical History:  Procedure Laterality Date   APPENDECTOMY     CARDIOVASCULAR STRESS TEST  10-11-2015   dr hochrein   normal nuclear study w/ no ischemia/  normal LV function and wall motion, nuclear stress ef 64%   D & C HYSTERSCOPY W/ RESECTION POLYP  01-16-2010   dr Mckenzie Hebert  @ [facility]   DILATATION & CURETTAGE/HYSTEROSCOPY WITH MYOSURE N/A 06/07/2018   Procedure: [place];  Surgeon: Mckenzie Auerbach, MD;  Location: [place];  Service: Gynecology;  Laterality: N/A;  request to follow first case in [place] block  time  requests one hour   FOOT SURGERY  2002, 2008   bi-lat , bone spurs, achilles tendon work   HAMMER TOE SURGERY  11/2018   RESECTION TUMOR [place]   benign   TONSILLECTOMY     TOTAL KNEE ARTHROPLASTY Left 12/09/2020   Procedure: TOTAL KNEE ARTHROPLASTY;  Surgeon: Mckenzie Arabian, MD;  Location: [facility];  Service: Orthopedics;  Laterality: Left;  75mn    Current Medications: Current Meds  Medication Sig   albuterol (VENTOLIN HFA) 108 (90 Base) MCG/ACT inhaler INHALE 2 PUFFS EVERY 6 HRS AS NEEDED   ALPRAZolam (XANAX) 1 MG tablet TAKE 1 TABLET(1 MG) BY MOUTH THREE TIMES DAILY (Patient taking differently: Take 1 mg by mouth as needed for sleep.)   blood glucose meter kit and supplies KIT by Does not apply route daily as needed. Check glucose daily before breakfast and supper.   Cholecalciferol (SM VITAMIN D3) 100 MCG (4000 UT) CAPS Take 4,000 Units by mouth daily.   clobetasol ointment (TEMOVATE) 03.55% Apply 1 application topically 2 (two) times daily.   clotrimazole-betamethasone (LOTRISONE) cream Apply 1 application topically 2 (two) times daily.   docusate sodium (COLACE) 100 MG capsule Take 100 mg by mouth 2 (two) times daily.   ezetimibe (ZETIA) 10 MG tablet TAKE 1 TABLET(10 MG) BY MOUTH DAILY   fluocinonide-emollient (LIDEX-E) 0.05 % cream Apply 1 application topically 2 (two) times daily.   levothyroxine (SYNTHROID) 150 MCG tablet TAKE 1 TABLET(150 MCG) BY MOUTH DAILY   pantoprazole (PROTONIX) 40 MG tablet TAKE 1 TABLET BY MOUTH DAILY   rosuvastatin (CRESTOR) 10 MG tablet TAKE 1 TABLET BY MOUTH EVERY DAY   [DISCONTINUED] metoprolol succinate (TOPROL XL) 25 MG 24 hr tablet Take 1.5 tablets (37.5 mg total) by mouth daily.     Allergies:   Percocet [oxycodone-acetaminophen]   Social History   Socioeconomic History   Marital status: Widowed    Spouse name: Not on file   Number of children: Not on file   Years of education: Not on file   Highest education level:  Not on file  Occupational History   Occupation: Histology    Employer: [employer]  Tobacco Use   Smoking status: Former    Types: Cigarettes    Quit date: 1973    Years since quitting: 50.4   Smokeless tobacco: Never  Vaping Use   Vaping Use: Never used  Substance and Sexual Activity   Alcohol use: No    Alcohol/week: 0.0 standard drinks of alcohol   Drug use: No   Sexual activity: Not Currently    Comment: INTERCOURSE AGE 14, SEXUAL PARTNERS LESS THAN 5  Other Topics  Concern   Not on file  Social History Narrative   Lives alone.     Social Determinants of Health   Financial Resource Strain: Not on file  Food Insecurity: Not on file  Transportation Needs: Not on file  Physical Activity: Not on file  Stress: Not on file  Social Connections: Not on file     Family History: The patient'sfamily history includes Alcohol abuse in her father; Autoimmune disease in her sister; Breast cancer in her paternal aunt; Cancer in her father; Depression in her father; Diabetes in her mother and sister; Heart disease (age of onset: 6) in her mother; Hyperlipidemia in her mother; Hypertension in her mother, sister, and sister; Leukemia in her paternal grandmother; Mental retardation in an other family member; Stroke in her mother; Thyroid disease in her sister.  ROS:   Please see the history of present illness.    All other systems reviewed and are negative.  EKGs/Labs/Other Studies Reviewed:    The following studies were reviewed today:   EKG:  EKG is  ordered today.  The ekg ordered today demonstrates normal sinus rhythm, PACs, rate 62,   Recent Labs: 01/29/2022: ALT 61; BUN 15; Creat 0.69; Hemoglobin 13.5; Platelets 255; Potassium 4.7; Sodium 139; TSH 2.25  Recent Lipid Panel    Component Value Date/Time   CHOL 131 01/29/2022 0916   CHOL 132 07/04/2021 1609   TRIG 75 01/29/2022 0916   HDL 74 01/29/2022 0916   HDL 63 07/04/2021 1609   CHOLHDL 1.8 01/29/2022 0916    VLDL 17 05/12/2016 0933   LDLCALC 41 01/29/2022 0916    Physical Exam:    VS:  BP 110/80 (BP Location: Left Arm)   Pulse 64   Ht _0  (1.702 m)   Wt 265 lb 6.4 oz (120.4 kg)   LMP 03/18/2008 (LMP Unknown)   SpO2 95%   BMI 41.57 kg/m     Wt Readings from Last 3 Encounters:  02/19/22 265 lb 6.4 oz (120.4 kg)  02/03/22 262 lb (118.8 kg)  01/19/22 260 lb (117.9 kg)     GEN: Well nourished, well developed in no acute distress HEENT: Normal NECK: No JVD; No carotid bruits CARDIAC: Irregular, normal rate, no murmurs RESPIRATORY:  Clear to auscultation without rales, wheezing or rhonchi  ABDOMEN: Soft, non-tender, non-distended MUSCULOSKELETAL:  No edema; No deformity  SKIN: Warm and dry NEUROLOGIC:  Alert and oriented x 3 PSYCHIATRIC:  Normal affect   ASSESSMENT:    1. PVC's (premature ventricular contractions)   2. Premature atrial contractions   3. Near syncope   4. Coronary artery disease involving native coronary artery of native heart without angina pectoris   5. Dilatation of aorta (HCC)   6. Morbid obesity ([place])       PLAN:    Near syncope: Unclear cause.  Echocardiogram on 11/29/2020 showed normal biventricular function, no significant valvular disease.  Zio patch x14 days on 01/30/2021 showed frequent PVCs (10% of beats), frequent PACs (10%), 31 episodes of SVT with longest lasting 14 seconds.   -Continue Toprol-XL  Frequent PACs/PVCs: Zio patch x14 days on 01/30/2021 showed frequent PVCs (10% of beats), frequent PACs (10%), 31 episodes of SVT with longest lasting 14 seconds.  Repeat Zio patch x3 days on metoprolol showed 4 episodes of SVT (longest lasting 7 beats), frequent PACs (15% of beats), occasional PVCs (4% of beats). -Continue Toprol-XL.  She reports significant fatigue on 37.5 mg daily, will reduce dose back to 25 mg daily  Hyperlipidemia: On Zetia 10 mg daily.  LDL 127 on 01/28/21.  10-year ASCVD risk score 5%.  Calcium score 353 (94th percentile) on  01/30/2021.  Started rosuvastatin 10 mg daily.  LDL 41 on 01/29/2022  Dilated aorta: Dilated ascending aorta measured 41 mm on calcium score 02/27/2021.  Recommend CTA chest in 1 year to follow.  Daytime somnolence: Sleep study 05/09/2021 showed mild OSA, no indication for CPAP at this time  Morbid obesity: Body mass index is 41.57 kg/m.  Follow[facility] with [facility]   RTC in 6 months   Medication Adjustments/Labs and Tests Ordered: Current medicines are reviewed at length with the patient today.  Concerns regarding medicines are outlined above.  No orders of the defined types were placed in this encounter.   Meds ordered this encounter  Medications   metoprolol succinate (TOPROL XL) 25 MG 24 hr tablet    Sig: Take 1 tablet (25 mg total) by mouth daily.    Dispense:  90 tablet    Refill:  3    Dose decrease      Patient Instructions  Medication Instructions:  DECREASE metoprolol to 25 mg daily  *If you need a refill on your cardiac medications before your next appointment, please call your pharmacy*  Follow-Up: At [facility], you and your health needs are our priority.  As part of our continuing mission to provide you with exceptional heart care, we have created designated Provider Care Teams.  These Care Teams include your primary Cardiologist (physician) and Advanced Practice Providers (APPs -  Physician Assistants and Nurse Practitioners) who all work together to provide you with the care you need, when you need it.  We recommend signing up for the patient portal called "MyChart".  Sign up information is provided on this After Visit Summary.  MyChart is used to connect with patients for Virtual Visits (Telemedicine).  Patients are able to view lab/test results, encounter notes, upcoming appointments, etc.  Non-urgent messages can be sent to your provider as well.   To learn more about what you can do with MyChart, go to [email or web address].    Your next  appointment:   6 month(s)  The format for your next appointment:   In Person  Provider:   Dr. Gardiner Rhyme  Important Information About Sugar         Signed, Donato Heinz, MD  02/19/2022 3:50 PM    [facility] apnea you

## 2022-02-20 LAB — BASIC METABOLIC PANEL
BUN/Creatinine Ratio: 21 (ref 12–28)
BUN: 15 mg/dL (ref 8–27)
CO2: 21 mmol/L (ref 20–29)
Calcium: 9.5 mg/dL (ref 8.7–10.3)
Chloride: 99 mmol/L (ref 96–106)
Creatinine, Ser: 0.73 mg/dL (ref 0.57–1.00)
Glucose: 77 mg/dL (ref 70–99)
Potassium: 4.5 mmol/L (ref 3.5–5.2)
Sodium: 139 mmol/L (ref 134–144)
eGFR: 91 mL/min/{1.73_m2} (ref >59)

## 2022-02-23 ENCOUNTER — Ambulatory Visit (INDEPENDENT_AMBULATORY_CARE_PROVIDER_SITE_OTHER): Payer: Medicare HMO | Admitting: Nurse Practitioner

## 2022-02-25 ENCOUNTER — Encounter: Payer: Self-pay | Admitting: *Deleted

## 2022-03-19 ENCOUNTER — Ambulatory Visit
Admission: RE | Admit: 2022-03-19 | Discharge: 2022-03-19 | Disposition: A | Discharge status: Home / Self Care | Payer: Medicare HMO | Source: Ambulatory Visit | Attending: Cardiology | Admitting: Cardiology

## 2022-03-19 DIAGNOSIS — I77819 Aortic ectasia, unspecified site: Secondary | ICD-10-CM | POA: Diagnosis not present

## 2022-03-19 DIAGNOSIS — I2510 Atherosclerotic heart disease of native coronary artery without angina pectoris: Secondary | ICD-10-CM | POA: Diagnosis not present

## 2022-03-19 DIAGNOSIS — I700 Atherosclerosis of aorta: Secondary | ICD-10-CM | POA: Diagnosis not present

## 2022-03-19 DIAGNOSIS — D1800 Hemangioma unspecified site: Secondary | ICD-10-CM | POA: Diagnosis not present

## 2022-03-19 MED ORDER — IOPAMIDOL (ISOVUE-370) INJECTION 76%
75.0000 mL | Freq: Once | INTRAVENOUS | Status: AC | PRN
Start: 2022-03-19 — End: 2022-03-19
  Administered 2022-03-19: 75 mL via INTRAVENOUS

## 2022-03-23 ENCOUNTER — Encounter: Payer: Self-pay | Admitting: *Deleted

## 2022-03-29 ENCOUNTER — Other Ambulatory Visit: Payer: Self-pay | Admitting: Internal Medicine

## 2022-04-12 ENCOUNTER — Other Ambulatory Visit: Payer: Self-pay | Admitting: Internal Medicine

## 2022-04-15 ENCOUNTER — Encounter (INDEPENDENT_AMBULATORY_CARE_PROVIDER_SITE_OTHER): Payer: Self-pay

## 2022-04-27 ENCOUNTER — Other Ambulatory Visit: Payer: Self-pay | Admitting: Internal Medicine

## 2022-04-27 DIAGNOSIS — Z1231 Encounter for screening mammogram for malignant neoplasm of breast: Secondary | ICD-10-CM

## 2022-05-05 ENCOUNTER — Other Ambulatory Visit (INDEPENDENT_AMBULATORY_CARE_PROVIDER_SITE_OTHER): Payer: Medicare HMO

## 2022-05-05 DIAGNOSIS — E1169 Type 2 diabetes mellitus with other specified complication: Secondary | ICD-10-CM

## 2022-05-05 DIAGNOSIS — E785 Hyperlipidemia, unspecified: Secondary | ICD-10-CM | POA: Diagnosis not present

## 2022-05-05 DIAGNOSIS — E1165 Type 2 diabetes mellitus with hyperglycemia: Secondary | ICD-10-CM

## 2022-05-05 NOTE — Addendum Note (Signed)
Addended by: Geradine Girt D on: 05/05/2022 09:15 AM   Modules accepted: Orders

## 2022-05-06 LAB — HEPATIC FUNCTION PANEL
AG Ratio: 1.7 (calc) (ref 1.0–2.5)
ALT: 41 U/L — ABNORMAL HIGH (ref 6–29)
AST: 36 U/L — ABNORMAL HIGH (ref 10–35)
Albumin: 4.2 g/dL (ref 3.6–5.1)
Alkaline phosphatase (APISO): 52 U/L (ref 37–153)
Bilirubin, Direct: 0.1 mg/dL (ref 0.0–0.2)
Globulin: 2.5 g/dL (calc) (ref 1.9–3.7)
Indirect Bilirubin: 0.4 mg/dL (calc) (ref 0.2–1.2)
Total Bilirubin: 0.5 mg/dL (ref 0.2–1.2)
Total Protein: 6.7 g/dL (ref 6.1–8.1)

## 2022-05-06 LAB — HEMOGLOBIN A1C
Hgb A1c MFr Bld: 6.1 % of total Hgb — ABNORMAL HIGH (ref <5.7)
Mean Plasma Glucose: 128 mg/dL
eAG (mmol/L): 7.1 mmol/L

## 2022-05-12 ENCOUNTER — Ambulatory Visit (INDEPENDENT_AMBULATORY_CARE_PROVIDER_SITE_OTHER): Payer: Medicare HMO | Admitting: Internal Medicine

## 2022-05-12 ENCOUNTER — Encounter: Payer: Self-pay | Admitting: Internal Medicine

## 2022-05-12 VITALS — BP 122/76 | HR 62 | Temp 97.5°F | Ht 67.0 in | Wt 275.0 lb

## 2022-05-12 DIAGNOSIS — E1165 Type 2 diabetes mellitus with hyperglycemia: Secondary | ICD-10-CM | POA: Diagnosis not present

## 2022-05-12 DIAGNOSIS — F5105 Insomnia due to other mental disorder: Secondary | ICD-10-CM

## 2022-05-12 DIAGNOSIS — F419 Anxiety disorder, unspecified: Secondary | ICD-10-CM

## 2022-05-12 DIAGNOSIS — F409 Phobic anxiety disorder, unspecified: Secondary | ICD-10-CM

## 2022-05-12 DIAGNOSIS — E785 Hyperlipidemia, unspecified: Secondary | ICD-10-CM | POA: Diagnosis not present

## 2022-05-12 DIAGNOSIS — Z23 Encounter for immunization: Secondary | ICD-10-CM

## 2022-05-12 DIAGNOSIS — M25561 Pain in right knee: Secondary | ICD-10-CM | POA: Diagnosis not present

## 2022-05-12 DIAGNOSIS — Z8719 Personal history of other diseases of the digestive system: Secondary | ICD-10-CM | POA: Diagnosis not present

## 2022-05-12 DIAGNOSIS — I493 Ventricular premature depolarization: Secondary | ICD-10-CM | POA: Diagnosis not present

## 2022-05-12 DIAGNOSIS — E039 Hypothyroidism, unspecified: Secondary | ICD-10-CM

## 2022-05-12 DIAGNOSIS — Z6841 Body Mass Index (BMI) 40.0 and over, adult: Secondary | ICD-10-CM

## 2022-05-12 DIAGNOSIS — R748 Abnormal levels of other serum enzymes: Secondary | ICD-10-CM

## 2022-05-12 DIAGNOSIS — E1169 Type 2 diabetes mellitus with other specified complication: Secondary | ICD-10-CM

## 2022-05-12 DIAGNOSIS — Z96651 Presence of right artificial knee joint: Secondary | ICD-10-CM | POA: Diagnosis not present

## 2022-05-12 DIAGNOSIS — R69 Illness, unspecified: Secondary | ICD-10-CM | POA: Diagnosis not present

## 2022-05-12 NOTE — Progress Notes (Signed)
   Subjective:    Patient ID: Mckenzie Hebert, female    DOB: 1956-05-20, 66 y.o.   MRN: [id number]  HPI 66 year old Female seen for follow up.  She was seen here in May for Welcome to Medicare physical exam.  She has a history of asthma, eczema, hyperplastic colon polyps, insomnia, anxiety, urticaria, migraine headaches, hypothyroidism, vitamin D deficiency, GE reflux, obesity, impaired glucose tolerance, history of Chiari I malformation and functional constipation.  She had H. pylori in 2009.  Herpes zoster in 2011.  Angioedema of the lip 2010.  Had right knee arthroplasty by Dr. Maureen Ralphs in April 2022  History of hysteroscopic resection of endometrial polyp and leiomyoma April 2019 by Dr. Phineas Real.  Had laparoscopic appendectomy 1995.  She believes Metoprolol has made her tired and hungry.    Review of Systems no recent issues with asthma.  Palpitations were evaluated in the Spring 2022 cardiology and she was found to have frequent PVCs and PACs with SVT and she had had near syncope in March 2022.  She was started on metoprolol for dysrhythmia.  She takes Zetia for hyperlipidemia.     Objective:   Physical Exam  Chest clear to auscultation.  No thyromegaly or carotid bruits. Cor: occasional irregular contraction.  Trace lower extremity edema.     Assessment & Plan:  In May 2022 her hemoglobin A1c was 5.7% but by November it had increased to 6.1% and in May 2023 was 6.3%.  She has been working with her diet and it is now 6.1%.  She does not want to be on glucose lowering medication.  She wants to continue to work with diet and exercise.  Regarding hyperlipidemia she has been on Zetia 10 mg daily and rosuvastatin 10 mg daily.  Regarding hypothyroidism, TSH was checked in May and was 2.25  GE reflux treated with Protonix 40 mg daily  History of PACs and PVCs followed by Dr. Alda Lea have history of COVID-19 in April treated with steroids and antibiotics  History of dilated  ascending aorta on calcium scoring June 2022.  Due for repeat CTA chest which was done in July 2023.  No evidence of thoracic aortic aneurysm or dissection.  Plan: Vaccines discussed.  Continue current medications.  Flu vaccine given today. Follow-up in March 2024.

## 2022-05-12 NOTE — Patient Instructions (Addendum)
Labs are stable. Continue current meds. May take either Benadryl or Xanax for sleep but not both. RTC in 6 months. Flu vaccine given.  Continue to work on diet exercise and weight loss.  May want to take new COVID variant vaccine in late September when it is available.  Consider pneumococcal 20 vaccine.

## 2022-05-20 DIAGNOSIS — M25561 Pain in right knee: Secondary | ICD-10-CM | POA: Diagnosis not present

## 2022-06-01 ENCOUNTER — Ambulatory Visit
Admission: RE | Admit: 2022-06-01 | Discharge: 2022-06-01 | Disposition: A | Discharge status: Home / Self Care | Payer: Medicare HMO | Source: Ambulatory Visit | Attending: Internal Medicine | Admitting: Internal Medicine

## 2022-06-01 DIAGNOSIS — Z1231 Encounter for screening mammogram for malignant neoplasm of breast: Secondary | ICD-10-CM | POA: Diagnosis not present

## 2022-08-10 ENCOUNTER — Other Ambulatory Visit: Payer: Self-pay | Admitting: Internal Medicine

## 2022-08-11 ENCOUNTER — Telehealth: Payer: Self-pay | Admitting: Internal Medicine

## 2022-08-11 ENCOUNTER — Encounter: Payer: Self-pay | Admitting: Internal Medicine

## 2022-08-11 MED ORDER — ALPRAZOLAM 1 MG PO TABS: 1.0000 mg | ORAL_TABLET | ORAL | 0 refills | Status: DC | PRN

## 2022-08-11 NOTE — Telephone Encounter (Signed)
Refilled Xanax to take at bedtime as needed. # 90 tabs with no refill. MJB, MD

## 2022-08-11 NOTE — Telephone Encounter (Signed)
Mckenzie Hebert [phone]  [place] needs new prescription sent to new pharmacy for below medication.   ALPRAZolam [name]) 1 MG tablet    [id number] [facility] [address] Phone: [phone] Fax: [phone]

## 2022-08-26 ENCOUNTER — Encounter: Payer: Self-pay | Admitting: Cardiology

## 2022-08-26 ENCOUNTER — Ambulatory Visit: Payer: Medicare HMO | Attending: Cardiology | Admitting: Cardiology

## 2022-08-26 VITALS — BP 146/90 | HR 68 | Ht 67.5 in | Wt 292.6 lb

## 2022-08-26 DIAGNOSIS — Z0289 Encounter for other administrative examinations: Secondary | ICD-10-CM

## 2022-08-26 DIAGNOSIS — I491 Atrial premature depolarization: Secondary | ICD-10-CM

## 2022-08-26 DIAGNOSIS — I493 Ventricular premature depolarization: Secondary | ICD-10-CM

## 2022-08-26 DIAGNOSIS — I10 Essential (primary) hypertension: Secondary | ICD-10-CM

## 2022-08-26 DIAGNOSIS — R55 Syncope and collapse: Secondary | ICD-10-CM | POA: Diagnosis not present

## 2022-08-26 MED ORDER — LOSARTAN POTASSIUM 25 MG PO TABS: 25.0000 mg | ORAL_TABLET | Freq: Every day | ORAL | 3 refills | Status: DC

## 2022-08-26 NOTE — Patient Instructions (Signed)
Medication Instructions:  START Losartan 25 mg daily  Please check your blood pressure at home daily, write it down.  Call the office or send message via Mychart with the readings in 2 weeks for Dr. Gardiner Rhyme to review.   *If you need a refill on your cardiac medications before your next appointment, please call your pharmacy*   Lab Work Return for labs in 1 week: CMET, CBC  Our in office lab hours are Monday-Friday 8:00-4:00, closed for lunch 12:45-1:45 pm.  No appointment needed.  [facility] locations:   [place] ([place]) - [address] [place] [address] - [address] [place] - [address] ([facility])  Follow-Up: At [facility], you and your health needs are our priority.  As part of our continuing mission to provide you with exceptional heart care, we have created designated Provider Care Teams.  These Care Teams include your primary Cardiologist (physician) and Advanced Practice Providers (APPs -  Physician Assistants and Nurse Practitioners) who all work together to provide you with the care you need, when you need it.  We recommend signing up for the patient portal called "MyChart".  Sign up information is provided on this After Visit Summary.  MyChart is used to connect with patients for Virtual Visits (Telemedicine).  Patients are able to view lab/test results, encounter notes, upcoming appointments, etc.  Non-urgent messages can be sent to your provider as well.   To learn more about what you can do with MyChart, go to [email or web address].    Your next appointment:   4 month(s)  The format for your next appointment:   In Person  Provider:   Dr. Gardiner Rhyme Other  Instructions Call [facility] to [facility]

## 2022-08-26 NOTE — Progress Notes (Signed)
[facility] Office Note:    Date:  08/26/2022   ID:  Mckenzie Hebert, DOB 08/10/1956, MRN [id number]  PCP:  Elby Showers, MD  Cardiologist:  None  Electrophysiologist:  None   Referring MD: Elby Showers, MD   Chief Complaint  Patient presents with   Hypertension     History of Present Illness:    Mckenzie Hebert is a 66 y.o. female with a hx of hypothyroidism, prediabetes, frequent PACs/PVCs who presents for follow-up.  She was referred by Dr. Renold Genta for evaluation of near syncopal episode, initially seen on 11/15/2020.  She works as a Sports coach and reports that on 10/10/2020 she was sitting down and felt lightheaded.  She denies any chest pain, dyspnea, palpitations.  States that she felt she was going to pass out, but turned her fan on and started taking deep breaths and symptoms improved.  She reports she lost 50 pounds from June to November last year.  Reports occasional chest pain, which she states occurs a couple times per year and describes as dull aching pain in the center of her chest that can last up to 30 minutes.  She denies any exertional chest pain.  States that she can walk up a flight of stairs without stopping, denies any chest pain or shortness of breath with this.  She smoked in her teens and 49s.  Family history includes mother had CABG and CHF, died during a nuclear stress test.  Lexiscan Myoview on 10/11/2015 showed normal perfusion, EF 64%.  Echocardiogram on 11/29/2020 showed normal biventricular function, no significant valvular disease.  Zio patch x14 days on 01/30/2021 showed frequent PVCs (10% of beats), frequent PACs (10%), 31 episodes of SVT with longest lasting 14 seconds.  Calcium score 353 (94th percentile) on 01/30/2021.  Repeat Zio patch x3 days on metoprolol showed 4 episodes of SVT (longest lasting 7 beats), frequent PACs (15% of beats), occasional PVCs (4% of beats).  Since last clinic visit, she reports he has been doing okay.  She retired in  October.  Has been noticing she bruises easily. Denies any chest pain, dyspnea, lightheadedness, syncope, lower extremity edema.  Reports rare palpitations.  Has not been exercising, has gained 27 pounds since last clinic visit 02/19/2022.   Wt Readings from Last 3 Encounters:  08/26/22 292 lb 9.6 oz (132.7 kg)  05/12/22 275 lb (124.7 kg)  02/19/22 265 lb 6.4 oz (120.4 kg)   BP Readings from Last 3 Encounters:  08/26/22 (!) 146/90  05/12/22 122/76  02/19/22 110/80       Past Medical History:  Diagnosis Date   AC (acromioclavicular) joint bone spurs 2008   right foot   Acid reflux    Anxiety    Asthma    Back pain    Back pain    Bilateral swelling of feet    Constipation    Dyspnea    Dysrhythmia 2019   PVC   Endometrial polyp    Endometrial polyp    Heartburn    History of colon polyps    Hypothyroidism    Joint pain    Joint pain    Migraines    Dr Catalina Gravel   Osteoarthritis    PAC (premature atrial contraction)    Palpitations    cardiologist-  dr hochrein-- normal myoview 2017, holter 2015 mild ectopy   PONV (postoperative nausea and vomiting)    Pre-diabetes 2019   PVC (premature ventricular contraction)    SOB (shortness of breath)  Thyroid disease    Uterine fibroid    Vitamin D deficiency     Past Surgical History:  Procedure Laterality Date   APPENDECTOMY     CARDIOVASCULAR STRESS TEST  10-11-2015   dr hochrein   normal nuclear study w/ no ischemia/  normal LV function and wall motion, nuclear stress ef 64%   D & C HYSTERSCOPY W/ RESECTION POLYP  01-16-2010   dr gottsegen  @ [facility] N/A 06/07/2018   Procedure: DILATATION & CURETTAGE/HYSTEROSCOPY WITH MYOSURE;  Surgeon: Anastasio Auerbach, MD;  Location: [facility];  Service: Gynecology;  Laterality: N/A;  request to follow first case in [place] block time  requests one hour   FOOT SURGERY  2002, 2008   bi-lat , bone spurs,  achilles tendon work   HAMMER TOE SURGERY  11/2018   RESECTION TUMOR [place]   benign   TONSILLECTOMY     TOTAL KNEE ARTHROPLASTY Left 12/09/2020   Procedure: TOTAL KNEE ARTHROPLASTY;  Surgeon: Gaynelle Arabian, MD;  Location: [facility];  Service: Orthopedics;  Laterality: Left;  8mn    Current Medications: Current Meds  Medication Sig   albuterol (VENTOLIN HFA) 108 (90 Base) MCG/ACT inhaler INHALE 2 PUFFS EVERY 6 HRS AS NEEDED   ALPRAZolam (XANAX) 1 MG tablet Take 1 tablet (1 mg total) by mouth as needed for sleep.   blood glucose meter kit and supplies KIT by Does not apply route daily as needed. Check glucose daily before breakfast and supper.   Cholecalciferol (SM VITAMIN D3) 100 MCG (4000 UT) CAPS Take 4,000 Units by mouth daily.   clobetasol ointment (TEMOVATE) 01.94% Apply 1 application topically 2 (two) times daily.   clotrimazole-betamethasone (LOTRISONE) cream Apply 1 application topically 2 (two) times daily.   docusate sodium (COLACE) 100 MG capsule Take 100 mg by mouth 2 (two) times daily.   ezetimibe (ZETIA) 10 MG tablet TAKE 1 TABLET BY MOUTH EVERY DAY   fluocinonide-emollient (LIDEX-E) 0.05 % cream Apply 1 application topically 2 (two) times daily.   levothyroxine (SYNTHROID) 150 MCG tablet TAKE 1 TABLET(150 MCG) BY MOUTH DAILY   losartan (COZAAR) 25 MG tablet Take 1 tablet (25 mg total) by mouth daily.   metoprolol succinate (TOPROL XL) 25 MG 24 hr tablet Take 1 tablet (25 mg total) by mouth daily.   pantoprazole (PROTONIX) 40 MG tablet TAKE 1 TABLET BY MOUTH EVERY DAY   rosuvastatin (CRESTOR) 10 MG tablet TAKE 1 TABLET BY MOUTH EVERY DAY     Allergies:   Percocet [oxycodone-acetaminophen]   Social History   Socioeconomic History   Marital status: Widowed    Spouse name: Not on file   Number of children: Not on file   Years of education: Not on file   Highest education level: Not on file  Occupational History   Occupation: Histology    Employer:   [employer]  Tobacco Use   Smoking status: Former    Types: Cigarettes    Quit date: 1973    Years since quitting: 51.0   Smokeless tobacco: Never  Vaping Use   Vaping Use: Never used  Substance and Sexual Activity   Alcohol use: No    Alcohol/week: 0.0 standard drinks of alcohol   Drug use: No   Sexual activity: Not Currently    Comment: INTERCOURSE AGE 59, SEXUAL PARTNERS LESS THAN 5  Other Topics Concern   Not on file  Social History Narrative  Lives alone.     Social Determinants of Health   Financial Resource Strain: Not on file  Food Insecurity: Not on file  Transportation Needs: Not on file  Physical Activity: Not on file  Stress: Not on file  Social Connections: Not on file     Family History: The patient'sfamily history includes Alcohol abuse in her father; Autoimmune disease in her sister; Breast cancer in her paternal aunt; Cancer in her father; Depression in her father; Diabetes in her mother and sister; Heart disease (age of onset: 67) in her mother; Hyperlipidemia in her mother; Hypertension in her mother, sister, and sister; Leukemia in her paternal grandmother; Mental retardation in an other family member; Stroke in her mother; Thyroid disease in her sister.  ROS:   Please see the history of present illness.    All other systems reviewed and are negative.  EKGs/Labs/Other Studies Reviewed:    The following studies were reviewed today:   EKG:   08/26/2022: Normal sinus rhythm, incomplete right bundle branch block, right axis deviation, rate 68  Recent Labs: 01/29/2022: Hemoglobin 13.5; Platelets 255; TSH 2.25 02/19/2022: BUN 15; Creatinine, Ser 0.73; Potassium 4.5; Sodium 139 05/05/2022: ALT 41  Recent Lipid Panel    Component Value Date/Time   CHOL 131 01/29/2022 0916   CHOL 132 07/04/2021 1609   TRIG 75 01/29/2022 0916   HDL 74 01/29/2022 0916   HDL 63 07/04/2021 1609   CHOLHDL 1.8 01/29/2022 0916   VLDL 17 05/12/2016 0933   LDLCALC  41 01/29/2022 0916    Physical Exam:    VS:  BP (!) 146/90   Pulse 68   Ht 5' 7.5" (1.715 m)   Wt 292 lb 9.6 oz (132.7 kg)   LMP 03/18/2008 (LMP Unknown)   BMI 45.15 kg/m     Wt Readings from Last 3 Encounters:  08/26/22 292 lb 9.6 oz (132.7 kg)  05/12/22 275 lb (124.7 kg)  02/19/22 265 lb 6.4 oz (120.4 kg)     GEN: Well nourished, well developed in no acute distress HEENT: Normal NECK: No JVD; No carotid bruits CARDIAC: Irregular, normal rate, no murmurs RESPIRATORY:  Clear to auscultation without rales, wheezing or rhonchi  ABDOMEN: Soft, non-tender, non-distended MUSCULOSKELETAL:  No edema; No deformity  SKIN: Warm and dry NEUROLOGIC:  Alert and oriented x 3 PSYCHIATRIC:  Normal affect   ASSESSMENT:    1. Near syncope   2. Morbid obesity ([place])   3. Premature atrial contractions   4. PVC's (premature ventricular contractions)   5. Essential hypertension     PLAN:    Near syncope: Unclear cause.  Echocardiogram on 11/29/2020 showed normal biventricular function, no significant valvular disease.  Zio patch x14 days on 01/30/2021 showed frequent PVCs (10% of beats), frequent PACs (10%), 31 episodes of SVT with longest lasting 14 seconds.   -Continue Toprol-XL  Frequent PACs/PVCs: Zio patch x14 days on 01/30/2021 showed frequent PVCs (10% of beats), frequent PACs (10%), 31 episodes of SVT with longest lasting 14 seconds.  Repeat Zio patch x3 days on metoprolol showed 4 episodes of SVT (longest lasting 7 beats), frequent PACs (15% of beats), occasional PVCs (4% of beats). -Continue Toprol-XL.  She reports significant fatigue on 37.5 mg daily, reduced dose back to 25 mg daily  Hyperlipidemia: On Zetia 10 mg daily.  LDL 127 on 01/28/21.  10-year ASCVD risk score 5%.  Calcium score 353 (94th percentile) on 01/30/2021.  Started rosuvastatin 10 mg daily.  LDL 41 on 01/29/2022  Hypertension:  On Toprol-XL 25 mg daily.  BP elevated, will add losartan 25 mg daily.  Asked to check BP  daily for next week and let us know results.  Check BMET in 1 week  Dilated aorta: Dilated ascending aorta measured 41 mm on calcium score 02/27/2021.  CTA chest 03/2022 showed no aortic aneurysm  Daytime somnolence: Sleep study 05/09/2021 showed mild OSA, no indication for CPAP at this time  Morbid obesity: Body mass index is 45.15 kg/m.  Previously lost 30 pounds following with healthy weight and wellness but has gained weight back.  Recommend reestablishing with healthy weight and wellness  Pulmonary nodule: 5 mm pulmonary nodule in CTA chest 03/2022.  Given lack of risk factors, no follow-up needed   RTC in 6 months   Medication Adjustments/Labs and Tests Ordered: Current medicines are reviewed at length with the patient today.  Concerns regarding medicines are outlined above.  Orders Placed This Encounter  Procedures   CBC   Comprehensive metabolic panel   EKG 51-WCHE    Meds ordered this encounter  Medications   losartan (COZAAR) 25 MG tablet    Sig: Take 1 tablet (25 mg total) by mouth daily.    Dispense:  90 tablet    Refill:  3      Patient Instructions  Medication Instructions:  START Losartan 25 mg daily  Please check your blood pressure at home daily, write it down.  Call the office or send message via Mychart with the readings in 2 weeks for Dr. Gardiner Rhyme to review.   *If you need a refill on your cardiac medications before your next appointment, please call your pharmacy*   Lab Work Return for labs in 1 week: CMET, CBC  Our in office lab hours are Monday-Friday 8:00-4:00, closed for lunch 12:45-1:45 pm.  No appointment needed.  [facility] locations:   [place] ([place] - [address] [place] [address] [place] - [address] ([facility])  Follow-Up: At [facility], you and your health needs are our priority.  As part of our continuing mission to provide you with exceptional heart care, we have created designated Provider Care Teams.  These Care Teams include your primary Cardiologist (physician) and Advanced Practice Providers (APPs -  Physician Assistants and Nurse Practitioners) who all work together to provide you with the care you need, when you need it.  We recommend signing up for the patient portal called "MyChart".  Sign up information is provided on this After Visit Summary.  MyChart is used to connect with patients for Virtual Visits (Telemedicine).  Patients are able to view lab/test results, encounter notes, upcoming appointments, etc.  Non-urgent messages can be sent to your provider as well.   To learn more about what you can do with MyChart, go to [email or web address].    Your next appointment:   4 month(s)  The format for your next appointment:   In Person  Provider:   Dr. Gardiner Rhyme Other Instructions Call [facility] to [facility]         Signed, Donato Heinz, MD  08/26/2022 5:07 PM  [facility] apnea you

## 2022-09-03 DIAGNOSIS — I491 Atrial premature depolarization: Secondary | ICD-10-CM | POA: Diagnosis not present

## 2022-09-03 DIAGNOSIS — R55 Syncope and collapse: Secondary | ICD-10-CM | POA: Diagnosis not present

## 2022-09-03 DIAGNOSIS — I10 Essential (primary) hypertension: Secondary | ICD-10-CM | POA: Diagnosis not present

## 2022-09-03 DIAGNOSIS — I493 Ventricular premature depolarization: Secondary | ICD-10-CM | POA: Diagnosis not present

## 2022-09-04 LAB — CBC
Hematocrit: 40.7 % (ref 34.0–46.6)
Hemoglobin: 13.7 g/dL (ref 11.1–15.9)
MCH: 28 pg (ref 26.6–33.0)
MCHC: 33.7 g/dL (ref 31.5–35.7)
MCV: 83 fL (ref 79–97)
Platelets: 266 10*3/uL (ref 150–450)
RBC: 4.89 x10E6/uL (ref 3.77–5.28)
RDW: 13.5 % (ref 11.7–15.4)
WBC: 7 10*3/uL (ref 3.4–10.8)

## 2022-09-04 LAB — COMPREHENSIVE METABOLIC PANEL
ALT: 28 IU/L (ref 0–32)
AST: 25 IU/L (ref 0–40)
Albumin/Globulin Ratio: 1.6 (ref 1.2–2.2)
Albumin: 4.3 g/dL (ref 3.9–4.9)
Alkaline Phosphatase: 71 IU/L (ref 44–121)
BUN/Creatinine Ratio: 19 (ref 12–28)
BUN: 13 mg/dL (ref 8–27)
Bilirubin Total: 0.4 mg/dL (ref 0.0–1.2)
CO2: 20 mmol/L (ref 20–29)
Calcium: 9.6 mg/dL (ref 8.7–10.3)
Chloride: 98 mmol/L (ref 96–106)
Creatinine, Ser: 0.67 mg/dL (ref 0.57–1.00)
Globulin, Total: 2.7 g/dL (ref 1.5–4.5)
Glucose: 182 mg/dL — ABNORMAL HIGH (ref 70–99)
Potassium: 4.6 mmol/L (ref 3.5–5.2)
Sodium: 137 mmol/L (ref 134–144)
Total Protein: 7 g/dL (ref 6.0–8.5)
eGFR: 96 mL/min/{1.73_m2} (ref >59)

## 2022-09-08 ENCOUNTER — Ambulatory Visit (INDEPENDENT_AMBULATORY_CARE_PROVIDER_SITE_OTHER): Payer: Medicare HMO | Admitting: Adult Health

## 2022-09-08 ENCOUNTER — Encounter (INDEPENDENT_AMBULATORY_CARE_PROVIDER_SITE_OTHER): Payer: Self-pay | Admitting: Adult Health

## 2022-09-08 VITALS — BP 133/87 | HR 69 | Temp 98.0°F | Ht 67.0 in | Wt 295.0 lb

## 2022-09-08 DIAGNOSIS — R748 Abnormal levels of other serum enzymes: Secondary | ICD-10-CM

## 2022-09-08 DIAGNOSIS — Z6841 Body Mass Index (BMI) 40.0 and over, adult: Secondary | ICD-10-CM | POA: Diagnosis not present

## 2022-09-08 DIAGNOSIS — I10 Essential (primary) hypertension: Secondary | ICD-10-CM | POA: Diagnosis not present

## 2022-09-08 DIAGNOSIS — E559 Vitamin D deficiency, unspecified: Secondary | ICD-10-CM

## 2022-09-08 DIAGNOSIS — Z6839 Body mass index (BMI) 39.0-39.9, adult: Secondary | ICD-10-CM

## 2022-09-08 DIAGNOSIS — R0602 Shortness of breath: Secondary | ICD-10-CM | POA: Diagnosis not present

## 2022-09-08 DIAGNOSIS — E669 Obesity, unspecified: Secondary | ICD-10-CM | POA: Diagnosis not present

## 2022-09-08 DIAGNOSIS — E1165 Type 2 diabetes mellitus with hyperglycemia: Secondary | ICD-10-CM | POA: Diagnosis not present

## 2022-09-09 LAB — INSULIN, RANDOM: INSULIN: 26 u[IU]/mL — ABNORMAL HIGH (ref 2.6–24.9)

## 2022-09-09 LAB — HEMOGLOBIN A1C
Est. average glucose Bld gHb Est-mCnc: 157 mg/dL
Hgb A1c MFr Bld: 7.1 % — ABNORMAL HIGH (ref 4.8–5.6)

## 2022-09-09 LAB — VITAMIN D 25 HYDROXY (VIT D DEFICIENCY, FRACTURES): Vit D, 25-Hydroxy: 30 ng/mL (ref 30.0–100.0)

## 2022-09-09 NOTE — Progress Notes (Unsigned)
Chief Complaint:   OBESITY Mckenzie Hebert is here to discuss her progress with her obesity treatment plan along with follow-up of her obesity related diagnoses. Mckenzie Hebert is on the Category 4 Plan and states she is following her eating plan approximately 0% of the time. [name] states she is not exercising.  Today's visit was #: 6 Starting weight: 251 LBS Starting date: 09/17/2021 Today's weight: 295 LBS Today's date: 09/08/2022 Total lbs lost to date: 0 Total lbs lost since last in-office visit: +35 LBS  Interim History: ***  Subjective:   1. Elevated liver enzymes 09/03/2022, CMP, LFTs within normal limits.  Patient denies RUQ pain.  2. Type 2 diabetes mellitus with hyperglycemia, without long-term current use of insulin (HCC) Metformin intolerant.  Currently she is not on any antidiabetes prescription medications.  Not checking blood glucose at home.  A1c***  3. SOBOE (shortness of breath on exertion) ***  4. Essential hypertension 08/26/2022, Dr. Su Grand, cardiology started losartan 25 mg daily.  Continue on metoprolol XL L 25 mg daily.  09/03/2022 CMP was stable.  5. Vitamin D deficiency She is OTC vitamin D3 2000 IU 2 tabs daily equaling 1000 IU daily per Dr. Renold Genta.  Per patient ergocalciferol causes nausea and vomiting.  Assessment/Plan:   1. Elevated liver enzymes Continue to weight loss efforts and avoid hepatotoxic substances.  2. Type 2 diabetes mellitus with hyperglycemia, without long-term current use of insulin ([place]) Check labs.  - Hemoglobin A1c - Insulin, random  3. SOBOE (shortness of breath on exertion) Check IC today.  4. Essential hypertension Continue antihypertensive therapy.  5. Vitamin D deficiency Check labs today.  - VITAMIN D 25 Hydroxy (Vit-D Deficiency, Fractures)  6. Obesity, current BMI 46.3 Mckenzie Hebert is currently in the action stage of change. As such, her goal is to continue with weight loss efforts. She has agreed to the Category 4 Plan.    Exercise goals: Older adults should follow the adult guidelines. When older adults cannot meet the adult guidelines, they should be as physically active as their abilities and conditions will allow.   Behavioral modification strategies: increasing lean protein intake, decreasing simple carbohydrates, meal planning and cooking strategies, keeping healthy foods in the home, and planning for success.  [name] has agreed to follow-up with our clinic in 2 weeks. She was informed of the importance of frequent follow-up visits to maximize her success with intensive lifestyle modifications for her multiple health conditions.   Mckenzie Hebert was informed we would discuss her lab results at her next visit unless there is a critical issue that needs to be addressed sooner. Mckenzie Hebert agreed to keep her next visit at the agreed upon time to discuss these results.  Objective:   Blood pressure 133/87, pulse 69, temperature 98 F (36.7 C), height '5\' 7"'$  (1.702 m), weight 295 lb (133.8 kg), last menstrual period 03/18/2008, SpO2 96 %. Body mass index is 46.2 kg/m.  General: Cooperative, alert, well developed, in no acute distress. HEENT: Conjunctivae and lids unremarkable. Cardiovascular: Regular rhythm.  Lungs: Normal work of breathing. Neurologic: No focal deficits.   Lab Results  Component Value Date   CREATININE 0.67 09/03/2022   BUN 13 09/03/2022   NA 137 09/03/2022   K 4.6 09/03/2022   CL 98 09/03/2022   CO2 20 09/03/2022   Lab Results  Component Value Date   ALT 28 09/03/2022   AST 25 09/03/2022   ALKPHOS 71 09/03/2022   BILITOT 0.4 09/03/2022   Lab Results  Component Value  Date   HGBA1C 7.1 (H) 09/08/2022   HGBA1C 6.1 (H) 05/05/2022   HGBA1C 6.3 (H) 01/29/2022   HGBA1C 6.1 (H) 07/29/2021   HGBA1C 5.7 (H) 01/28/2021   Lab Results  Component Value Date   INSULIN 26.0 (H) 09/08/2022   INSULIN 9.7 09/17/2021   INSULIN 20.7 02/14/2020   Lab Results  Component Value Date   TSH 2.25  01/29/2022   Lab Results  Component Value Date   CHOL 131 01/29/2022   HDL 74 01/29/2022   LDLCALC 41 01/29/2022   TRIG 75 01/29/2022   CHOLHDL 1.8 01/29/2022   Lab Results  Component Value Date   VD25OH 30.0 09/08/2022   VD25OH 42 01/29/2022   VD25OH 31 07/29/2021   Lab Results  Component Value Date   WBC 7.0 09/03/2022   HGB 13.7 09/03/2022   HCT 40.7 09/03/2022   MCV 83 09/03/2022   PLT 266 09/03/2022   Lab Results  Component Value Date   IRON 60 01/14/2018   TIBC 398 01/14/2018   FERRITIN 22 01/14/2018   Attestation Statements:   Reviewed by clinician on day of visit: allergies, medications, problem list, medical history, surgical history, family history, social history, and previous encounter notes.  I, Davy Pique, RMA, am acting as Location manager for [name], NP.  I have reviewed the above documentation for accuracy and completeness, and I agree with the above. -  ***

## 2022-09-14 ENCOUNTER — Ambulatory Visit (INDEPENDENT_AMBULATORY_CARE_PROVIDER_SITE_OTHER): Payer: Medicare HMO | Admitting: Adult Health

## 2022-09-22 ENCOUNTER — Ambulatory Visit (INDEPENDENT_AMBULATORY_CARE_PROVIDER_SITE_OTHER): Payer: Medicare HMO | Admitting: Adult Health

## 2022-09-22 ENCOUNTER — Encounter (INDEPENDENT_AMBULATORY_CARE_PROVIDER_SITE_OTHER): Payer: Self-pay | Admitting: Adult Health

## 2022-09-22 VITALS — BP 107/58 | HR 71 | Temp 98.4°F | Ht 67.0 in | Wt 286.0 lb

## 2022-09-22 DIAGNOSIS — Z7984 Long term (current) use of oral hypoglycemic drugs: Secondary | ICD-10-CM | POA: Diagnosis not present

## 2022-09-22 DIAGNOSIS — E1165 Type 2 diabetes mellitus with hyperglycemia: Secondary | ICD-10-CM | POA: Diagnosis not present

## 2022-09-22 DIAGNOSIS — E669 Obesity, unspecified: Secondary | ICD-10-CM

## 2022-09-22 DIAGNOSIS — E559 Vitamin D deficiency, unspecified: Secondary | ICD-10-CM | POA: Diagnosis not present

## 2022-09-22 DIAGNOSIS — Z6841 Body Mass Index (BMI) 40.0 and over, adult: Secondary | ICD-10-CM

## 2022-09-22 DIAGNOSIS — I152 Hypertension secondary to endocrine disorders: Secondary | ICD-10-CM

## 2022-09-22 DIAGNOSIS — E1159 Type 2 diabetes mellitus with other circulatory complications: Secondary | ICD-10-CM | POA: Diagnosis not present

## 2022-09-22 MED ORDER — METFORMIN HCL 500 MG PO TABS: ORAL_TABLET | ORAL | 0 refills | Status: DC

## 2022-09-29 NOTE — Progress Notes (Signed)
Chief Complaint:   OBESITY Mckenzie Hebert is here to discuss her progress with her obesity treatment plan along with follow-up of her obesity related diagnoses. Mckenzie Hebert is on the Category 4 Plan and states she is following her eating plan approximately 70% of the time.  Today's visit was #: 7 Starting weight: 251 LBS Starting date: 09/17/2021 Today's weight: 286 LBS Today's date: 09/22/2022 Total lbs lost to date: 0 Total lbs lost since last in-office visit: 9 LBS  Interim History:  Mckenzie Hebert cleaned out the kitchen of all the foods that were "off plan".  She feels emotionally better and is ready to "focus on me". She has walked "just one" since last OV at [facility].  Subjective:   1. Type 2 diabetes mellitus with hyperglycemia, without long-term current use of insulin (HCC) Worsening.  Discussed labs with patient today. Lab Results  Component Value Date   HGBA1C 7.1 (H) 09/08/2022   HGBA1C 6.1 (H) 05/05/2022   HGBA1C 6.3 (H) 01/29/2022    Patient is not on any antidiabetic medications.   Metformin caused her to be foggy headed.   Patient was working third shift at the time of Metformin therapy. Mckenzie Hebert is now retired. 09/03/22 CMP GFR 96 Discussed risks/benefits of restarting Metformin therapy. She denies family hx of MEN 2 or MTC. She denies personal hx of pancreatitis. Discussed risks/benefits of GLP-1 therapy. She prefers to restart low dose Metformin.  2. Vitamin D deficiency Work doing.  Discussed labs with patient today. 09/08/2022, vitamin D level was 30.0.   Patient is currently on OTC vitamin D3 4000 IU daily. Per patient ergocalciferol caused nausea and vomiting.   3. Hypertension associated with type 2 diabetes mellitus ([place]) Discussed labs with patient today. On 09/08/2022, CMP, electrolytes, kidney function stable.   Blood pressure is at goal at office visit.  Assessment/Plan:   1. Type 2 diabetes mellitus with hyperglycemia, without long-term current use of  insulin (HCC) Restart low-dose metformin 500 mg half tab with breakfast.  Restart- metFORMIN (GLUCOPHAGE) 500 MG tablet; 1/2 tab with breakfast daily  Dispense: 30 tablet; Refill: 0 Remain on low dose until f/[facility] at [facility].  2. Vitamin D deficiency Increase OTC from 4000 to 5000 IU daily.  3. Hypertension associated with type 2 diabetes mellitus ([place]) Continue antihypertensive therapy per cardiology. Limit Na+ intake.  4. Obesity, current BMI 44.8 Mckenzie Hebert is currently in the action stage of change. As such, her goal is to continue with weight loss efforts. She has agreed to the Category 4 Plan.   Exercise goals:  Increase daily activity as tolerated.  Behavioral modification strategies: increasing lean protein intake, decreasing simple carbohydrates, meal planning and cooking strategies, keeping healthy foods in the home, and planning for success.  Mckenzie Hebert has agreed to follow-up with our clinic in 3 weeks. She was informed of the importance of frequent follow-up visits to maximize her success with intensive lifestyle modifications for her multiple health conditions.   Objective:   Blood pressure (!) 107/58, pulse 71, temperature 98.4 F (36.9 C), height '5\' 7"'$  (1.702 m), weight 286 lb (129.7 kg), last menstrual period 03/18/2008, SpO2 97 %. Body mass index is 44.79 kg/m.  General: Cooperative, alert, well developed, in no acute distress. HEENT: Conjunctivae and lids unremarkable. Cardiovascular: Regular rhythm.  Lungs: Normal work of breathing. Neurologic: No focal deficits.   Lab Results  Component Value Date   CREATININE 0.67 09/03/2022   BUN 13 09/03/2022   NA 137 09/03/2022   K  4.6 09/03/2022   CL 98 09/03/2022   CO2 20 09/03/2022   Lab Results  Component Value Date   ALT 28 09/03/2022   AST 25 09/03/2022   ALKPHOS 71 09/03/2022   BILITOT 0.4 09/03/2022   Lab Results  Component Value Date   HGBA1C 7.1 (H) 09/08/2022   HGBA1C 6.1 (H) 05/05/2022   HGBA1C 6.3 (H)  01/29/2022   HGBA1C 6.1 (H) 07/29/2021   HGBA1C 5.7 (H) 01/28/2021   Lab Results  Component Value Date   INSULIN 26.0 (H) 09/08/2022   INSULIN 9.7 09/17/2021   INSULIN 20.7 02/14/2020   Lab Results  Component Value Date   TSH 2.25 01/29/2022   Lab Results  Component Value Date   CHOL 131 01/29/2022   HDL 74 01/29/2022   LDLCALC 41 01/29/2022   TRIG 75 01/29/2022   CHOLHDL 1.8 01/29/2022   Lab Results  Component Value Date   VD25OH 30.0 09/08/2022   VD25OH 42 01/29/2022   VD25OH 31 07/29/2021   Lab Results  Component Value Date   WBC 7.0 09/03/2022   HGB 13.7 09/03/2022   HCT 40.7 09/03/2022   MCV 83 09/03/2022   PLT 266 09/03/2022   Lab Results  Component Value Date   IRON 60 01/14/2018   TIBC 398 01/14/2018   FERRITIN 22 01/14/2018   Attestation Statements:   Reviewed by clinician on day of visit: allergies, medications, problem list, medical history, surgical history, family history, social history, and previous encounter notes.  I, Mckenzie Hebert, RMA, am acting as Location manager for Mckenzie Marble, NP.  I have reviewed the above documentation for accuracy and completeness, and I agree with the above. -  Mckenzie Hebert d. Mckenzie Nickson, NP-C

## 2022-10-13 ENCOUNTER — Encounter (INDEPENDENT_AMBULATORY_CARE_PROVIDER_SITE_OTHER): Payer: Self-pay | Admitting: Adult Health

## 2022-10-13 ENCOUNTER — Ambulatory Visit (INDEPENDENT_AMBULATORY_CARE_PROVIDER_SITE_OTHER): Payer: Medicare HMO | Admitting: Adult Health

## 2022-10-13 VITALS — BP 135/88 | HR 64 | Temp 98.5°F | Ht 67.0 in | Wt 283.0 lb

## 2022-10-13 DIAGNOSIS — Z6841 Body Mass Index (BMI) 40.0 and over, adult: Secondary | ICD-10-CM | POA: Diagnosis not present

## 2022-10-13 DIAGNOSIS — E1165 Type 2 diabetes mellitus with hyperglycemia: Secondary | ICD-10-CM

## 2022-10-13 DIAGNOSIS — Z7984 Long term (current) use of oral hypoglycemic drugs: Secondary | ICD-10-CM | POA: Diagnosis not present

## 2022-10-13 DIAGNOSIS — E669 Obesity, unspecified: Secondary | ICD-10-CM | POA: Diagnosis not present

## 2022-10-13 MED ORDER — METFORMIN HCL 500 MG PO TABS: ORAL_TABLET | ORAL | 0 refills | Status: DC

## 2022-10-22 NOTE — Progress Notes (Unsigned)
Chief Complaint:   OBESITY Mckenzie Hebert is here to discuss her progress with her obesity treatment plan along with follow-up of her obesity related diagnoses. Mckenzie Hebert is on the Category 4 Plan and states she is following her eating plan approximately 85% of the time. Mckenzie Hebert states she is bike riding 30 minutes 2 times per week.  Today's visit was #: 8 Starting weight: 251 LBS Starting date: 09/17/2021 Today's weight: 283 LBS Today's date: 10/13/2022 Total lbs lost to date: 0 Total lbs lost since last in-office visit: 3 LBS  Interim History: ***  Subjective:   1. Type 2 diabetes mellitus with hyperglycemia, without long-term current use of insulin (HCC) A1c*** Patient restarted on low-dose metformin 500 mg half tab at last office visit on 09/22/2022, at breakfast.  Patient denies GI upset.  On 09/03/2022, CMP, GFR was 96.  Assessment/Plan:   1. Type 2 diabetes mellitus with hyperglycemia, without long-term current use of insulin (HCC) Refill- metFORMIN (GLUCOPHAGE) 500 MG tablet; 1 tab with breakfast daily  Dispense: 30 tablet; Refill: 0  2. Obesity, current BMI 44.3 Mckenzie Hebert is currently in the action stage of change. As such, her goal is to continue with weight loss efforts. She has agreed to the Category 4 Plan.   Exercise goals:  As is.  Behavioral modification strategies: increasing lean protein intake, decreasing simple carbohydrates, meal planning and cooking strategies, keeping healthy foods in the home, better snacking choices, emotional eating strategies, and planning for success.  Mckenzie Hebert has agreed to follow-up with our clinic in 3 weeks. She was informed of the importance of frequent follow-up visits to maximize her success with intensive lifestyle modifications for her multiple health conditions.   Objective:   Blood pressure 135/88, pulse 64, temperature 98.5 F (36.9 C), height 5' 7"$  (1.702 m), weight 283 lb (128.4 kg), last menstrual period 03/18/2008, SpO2 97 %. Body mass  index is 44.32 kg/m.  General: Cooperative, alert, well developed, in no acute distress. HEENT: Conjunctivae and lids unremarkable. Cardiovascular: Regular rhythm.  Lungs: Normal work of breathing. Neurologic: No focal deficits.   Lab Results  Component Value Date   CREATININE 0.67 09/03/2022   BUN 13 09/03/2022   NA 137 09/03/2022   K 4.6 09/03/2022   CL 98 09/03/2022   CO2 20 09/03/2022   Lab Results  Component Value Date   ALT 28 09/03/2022   AST 25 09/03/2022   ALKPHOS 71 09/03/2022   BILITOT 0.4 09/03/2022   Lab Results  Component Value Date   HGBA1C 7.1 (H) 09/08/2022   HGBA1C 6.1 (H) 05/05/2022   HGBA1C 6.3 (H) 01/29/2022   HGBA1C 6.1 (H) 07/29/2021   HGBA1C 5.7 (H) 01/28/2021   Lab Results  Component Value Date   INSULIN 26.0 (H) 09/08/2022   INSULIN 9.7 09/17/2021   INSULIN 20.7 02/14/2020   Lab Results  Component Value Date   TSH 2.25 01/29/2022   Lab Results  Component Value Date   CHOL 131 01/29/2022   HDL 74 01/29/2022   LDLCALC 41 01/29/2022   TRIG 75 01/29/2022   CHOLHDL 1.8 01/29/2022   Lab Results  Component Value Date   VD25OH 30.0 09/08/2022   VD25OH 42 01/29/2022   VD25OH 31 07/29/2021   Lab Results  Component Value Date   WBC 7.0 09/03/2022   HGB 13.7 09/03/2022   HCT 40.7 09/03/2022   MCV 83 09/03/2022   PLT 266 09/03/2022   Lab Results  Component Value Date   IRON 60 01/14/2018  TIBC 398 01/14/2018   FERRITIN 22 01/14/2018    Attestation Statements:   Reviewed by clinician on day of visit: allergies, medications, problem list, medical history, surgical history, family history, social history, and previous encounter notes.  I, Davy Pique, RMA, am acting as Location manager for [name], NP.  I have reviewed the above documentation for accuracy and completeness, and I agree with the above. -  ***

## 2022-11-03 ENCOUNTER — Encounter (INDEPENDENT_AMBULATORY_CARE_PROVIDER_SITE_OTHER): Payer: Self-pay | Admitting: Adult Health

## 2022-11-03 ENCOUNTER — Ambulatory Visit (INDEPENDENT_AMBULATORY_CARE_PROVIDER_SITE_OTHER): Payer: Medicare HMO | Admitting: Adult Health

## 2022-11-03 VITALS — BP 120/82 | HR 62 | Temp 98.4°F | Ht 67.0 in | Wt 282.0 lb

## 2022-11-03 DIAGNOSIS — Z6841 Body Mass Index (BMI) 40.0 and over, adult: Secondary | ICD-10-CM | POA: Diagnosis not present

## 2022-11-03 DIAGNOSIS — E1165 Type 2 diabetes mellitus with hyperglycemia: Secondary | ICD-10-CM | POA: Diagnosis not present

## 2022-11-03 DIAGNOSIS — F439 Reaction to severe stress, unspecified: Secondary | ICD-10-CM | POA: Diagnosis not present

## 2022-11-03 DIAGNOSIS — E559 Vitamin D deficiency, unspecified: Secondary | ICD-10-CM

## 2022-11-03 DIAGNOSIS — R69 Illness, unspecified: Secondary | ICD-10-CM | POA: Diagnosis not present

## 2022-11-03 DIAGNOSIS — E669 Obesity, unspecified: Secondary | ICD-10-CM

## 2022-11-03 DIAGNOSIS — E66812 Obesity, class 2: Secondary | ICD-10-CM

## 2022-11-03 DIAGNOSIS — Z7984 Long term (current) use of oral hypoglycemic drugs: Secondary | ICD-10-CM | POA: Diagnosis not present

## 2022-11-03 MED ORDER — METFORMIN HCL 500 MG PO TABS: ORAL_TABLET | ORAL | 0 refills | Status: DC

## 2022-11-03 NOTE — Progress Notes (Addendum)
Patient Care Team: Elby Showers, MD as PCP - General (Internal Medicine)  Visit Date: 11/10/22  Subjective:    Patient ID: Mckenzie Hebert , Female   DOB: 02/25/1956, 67 y.o.    MRN: [id number]   66 y.o. Female presents today for a 6 month follow-up. Patient has a past medical history of acromioclavicular joint bone spurs, GE reflux, anxiety, asthma, back pain, LE edema, constipation, dyspnea,  hypothyroidism, migraines, osteoarthritis, premature atrial/ventricular contraction, palpitations, impaired glucose tolerance, shortness of breath, hypothyroidism, and Vitamin D deficiency.  History of asthma treated with Ventolin inhaler.  History of glucose intolerance treated with Metformin 500 mg daily with breakfast. HGBA1c at 6.8% on 11/05/22. Goes to [facility] every 2-3 weeks.   History of hyperlipidemia treated with Rosuvastatin10 mg daily and Zetia 10 mg daily. HDL was  low at 47 on 11/05/22, lipid panel otherwise normal.  History of hypertension treated with Toprol XL 25 mg daily, Cozaar 25 mg daily. Blood pressure at 120/76 today.  History of GERD treated with Protonix 40 mg daily.  History of Vitamin D deficiency treated with Vitamin D3 4,000 units daily.  History of hypothyroidism treated with Levothyroxine 150 mcg daily. TSH normal at 2.06 on 11/05/22.  Being seen at [facility].   Followed by Dr. Gardiner Rhyme, Cardiologist for PVCs, PACs and CAD.Zio patch May 2022 showed PACs, SVT 14 seconds, PVCs. Echocardiogram showed no valvular disease.  Having osteoarthritis symptoms right knee joint and has seen orthopedist for injections. Hx left TKA by Dr. Maureen Ralphs in April 2022.   Past Medical History:  Diagnosis Date   AC (acromioclavicular) joint bone spurs 2008   right foot   Acid reflux    Anxiety    Asthma    Back pain    Back pain    Bilateral swelling of feet    Constipation    Dyspnea    Dysrhythmia 2019   PVC   Endometrial  polyp    Endometrial polyp    Heartburn    History of colon polyps    Hypothyroidism    Joint pain    Joint pain    Migraines    Dr Catalina Gravel   Osteoarthritis    PAC (premature atrial contraction)    Palpitations    cardiologist-  dr hochrein-- normal myoview 2017, holter 2015 mild ectopy   PONV (postoperative nausea and vomiting)    Pre-diabetes 2019   PVC (premature ventricular contraction)    SOB (shortness of breath)    Thyroid disease    Uterine fibroid    Vitamin D deficiency      Family History  Problem Relation Age of Onset   Diabetes Mother    Hypertension Mother    Heart disease Mother 76       CABG   Stroke Mother    Hyperlipidemia Mother    Cancer Father        lung cancer   Depression Father    Alcohol abuse Father    Diabetes Sister    Hypertension Sister    Thyroid disease Sister        hyperthyroidism   Breast cancer Paternal Aunt    Hypertension Sister    Autoimmune disease Sister    Leukemia Paternal Grandmother    Mental retardation Other     Social History   Social History Narrative   Lives alone.        Review of Systems  Constitutional:  Negative for fever and malaise/fatigue.  HENT:  Negative for congestion.   Eyes:  Negative for blurred vision.  Respiratory:  Negative for cough and shortness of breath.   Cardiovascular:  Negative for chest pain, palpitations and leg swelling.  Gastrointestinal:  Negative for vomiting.  Musculoskeletal:  Negative for back pain.  Skin:  Negative for rash.  Neurological:  Negative for loss of consciousness and headaches.        Objective:   Vitals: BP 120/76 (BP Location: Left Arm, Patient Position: Sitting, Cuff Size: Large)   Pulse 69   Temp 98.2 F (36.8 C) (Tympanic)   Resp 18   Ht '5\' 7"'$  (1.702 m)   Wt 285 lb (129.3 kg)   LMP 03/18/2008 (LMP Unknown)   SpO2 98%   BMI 44.64 kg/m    Physical Exam Vitals and nursing note reviewed.  Constitutional:      General: She is not in acute  distress.    Appearance: Normal appearance. She is not toxic-appearing.  HENT:     Head: Normocephalic and atraumatic.  Cardiovascular:     Rate and Rhythm: Normal rate and regular rhythm. No extrasystoles are present.    Heart sounds: Normal heart sounds. No murmur heard.    No gallop.  Pulmonary:     Effort: Pulmonary effort is normal. No respiratory distress.     Breath sounds: Normal breath sounds. No wheezing or rales.  Skin:    General: Skin is warm and dry.  Neurological:     Mental Status: She is alert and oriented to person, place, and time. Mental status is at baseline.  Psychiatric:        Mood and Affect: Mood normal.        Behavior: Behavior normal.        Thought Content: Thought content normal.        Judgment: Judgment normal.       Results:   Studies obtained and personally reviewed by me:   Labs:       Component Value Date/Time   NA 137 09/03/2022 1158   K 4.6 09/03/2022 1158   CL 98 09/03/2022 1158   CO2 20 09/03/2022 1158   GLUCOSE 182 (H) 09/03/2022 1158   GLUCOSE 100 (H) 01/29/2022 0916   BUN 13 09/03/2022 1158   CREATININE 0.67 09/03/2022 1158   CREATININE 0.69 01/29/2022 0916   CALCIUM 9.6 09/03/2022 1158   PROT 6.9 11/05/2022 0916   PROT 7.0 09/03/2022 1158   ALBUMIN 4.3 09/03/2022 1158   AST 18 11/05/2022 0916   ALT 20 11/05/2022 0916   ALKPHOS 71 09/03/2022 1158   BILITOT 0.6 11/05/2022 0916   BILITOT 0.4 09/03/2022 1158   GFRNONAA 94 01/28/2021 0919   GFRAA 109 01/28/2021 0919     Lab Results  Component Value Date   WBC 7.0 09/03/2022   HGB 13.7 09/03/2022   HCT 40.7 09/03/2022   MCV 83 09/03/2022   PLT 266 09/03/2022    Lab Results  Component Value Date   CHOL 104 11/05/2022   HDL 47 (L) 11/05/2022   LDLCALC 37 11/05/2022   TRIG 113 11/05/2022   CHOLHDL 2.2 11/05/2022    Lab Results  Component Value Date   HGBA1C 6.8 (H) 11/05/2022     Lab Results  Component Value Date   TSH 2.06 11/05/2022       Assessment & Plan:   Glucose intolerance: treated with Glucophage 500 mg daily with breakfast. HGBA1c at 6.8% on 11/05/22.  Goes to [facility] every 2-3 weeks.   Hyperlipidemia: treated with Crestor 10 mg daily, Zetia 10 mg daily. HDL low at 47 on 11/05/22, lipid panel otherwise normal.  Hypertension: treated with Toprol XL 25 mg daily, Cozaar 25 mg daily. Blood pressure at 120/76 today.  Acid reflux: treated with Protonix 40 mg daily.  Vitamin D deficiency: treated with Vitamin D3 4,000 units daily.  Hypothyroidism: treated with Synthroid 150 mcg daily. TSH normal at 2.06 on 11/05/22.  Vaccine Counseling: UTD on flu vaccine. Will go to the pharmacy to receive tetanus vaccine. Administered pneumococcal 20 vaccine in office today.  RTC in 6 months for medicare wellness visit. Have Tdap at local pharamcy. No change in medications.    I,Alexander Ruley,acting as a Education administrator for Elby Showers, MD.,have documented all relevant documentation on the behalf of Elby Showers, MD,as directed by  Elby Showers, MD while in the presence of Elby Showers, MD.   I, Elby Showers, MD, have reviewed all documentation for this visit. The documentation on 11/10/22 for the exam, diagnosis, procedures, and orders are all accurate and complete.

## 2022-11-03 NOTE — Progress Notes (Signed)
Chief Complaint:   OBESITY Mckenzie Hebert is here to discuss her progress with her obesity treatment plan along with follow-up of her obesity related diagnoses. Mckenzie Hebert is on the Category 4 Plan and states she is following her eating plan approximately 50% of the time. Mckenzie Hebert.  Today's visit was #: 9 Starting weight: 278 lbs Starting date: 02/14/2020 Today's weight: 282 lbs Today's date: 11/03/2022 Total lbs lost to date: + 4 lbs Total lbs lost since last in-office visit: + 1 lb  Interim History:  Mckenzie Hebert will have fasting labs completed 11/05/22 per PCP. She has OV with PCP for 6 month f/u on 11/10/22 with PCP/Dr. Renold Genta  She endorses increase in stress at home, from the following events: 1) Her sister "Butch Penny" continues to be an emotional and financial strain. Mckenzie Hebert is the youngest of 5 siblings and she and Butch Penny are only 17-18 months apart in age. Butch Penny lives 10 mins from [place] and if she doesn't answer Mckenzie phone calls, she will simply show up at her house. 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 The dog vomited at his home, after returning from Mckenzie Hebert's home. The dog's owner refused to pay for the life saving surgery and Mckenzie Hebert felt obligated to pay [phone] in [employer]. Per pt, the dog has made a full recovery.  Due to this situation, Mckenzie Hebert will refuse future Foster/Dog sitting opportunities.  Of Note- Mckenzie Hebert is one of 5 children. She is the youngest of the group.  Subjective:   1. Type 2 diabetes mellitus with hyperglycemia, without long-term current use of insulin (HCC) Lab Results  Component Value Date   HGBA1C 7.1 (H) 09/08/2022   HGBA1C 6.1 (H) 05/05/2022   HGBA1C 6.3 (H) 01/29/2022   Started on Metformin '500mg'$  1/2 tab QD- 09/22/22 Increased to Metformin '500mg'$  full tab - 10/13/22 She denies GI  upset, unsure if Metformin is reducing sugar/CHO cravings.  2. Vitamin D deficiency  Latest Reference Range & Units 09/08/22 09:39  Vitamin D, 25-Hydroxy 30.0 - 100.0 ng/mL 30.0  She is on OTC Vit D3 4,000 IU QD. Per Pt- she is unable to tolerate Ergocalciferol therapy.  3. Stress at home She endorses increased stress within her family and recently with a former work Social worker. She was dog sitting for her colleague.   Per pt- the dog allegedly consumed something that caused intestinal distress, requiring surgical intervention. Mckenzie Hebert ultimately paid for all Vet expenses for fear of litigation.  Her sister "Butch Penny" relies on Mckenzie Hebert for emotional and financial support.  Mckenzie Hebert is suffering from Alzheimer's.  She denies SI/HI She is not on daily antidepressant, Xanax '1mg'$  PRN QHS- insomnia. PDMP reviewed- last Xanax '1mg'$  RF 08/11/22 for 90 count  She had one Video MyChart visit with Dr. Mallie Mussel and per pt she prefers not to continue therapy with this provider.  She has been seen at [facility] and had a positive experience.  Assessment/Plan:   1. Type 2 diabetes mellitus with hyperglycemia, without long-term current use of insulin (HCC) Refill Metformin '500mg'$  QD Disp 30 RF 0  2. Vitamin D deficiency Continue OTC Vit D3 supplementation.  3. Stress at home Reestablish with [facility]- contact information provided to pt.  Spend time with pets and positive friends/family.  Limit contact with "Butch Penny" if able.  4.  Obesity, current BMI 44.2 Mckenzie Hebert is currently in the action stage of change. As such, her goal is to continue with weight loss efforts. She has agreed to the Category 4 Plan.   Exercise goals: Older adults should follow the adult guidelines. When older adults cannot meet the adult guidelines, they should be as physically active as their abilities and conditions will allow.   Behavioral modification strategies:  increasing lean protein intake, decreasing simple carbohydrates, increasing vegetables, increasing water intake, decreasing sodium intake, increasing high fiber foods, decreasing eating out, no skipping meals, meal planning and cooking strategies, keeping healthy foods in the home, ways to avoid boredom eating, ways to avoid night time snacking, better snacking choices, emotional eating strategies, avoiding temptations, and planning for success.  Mckenzie Hebert has agreed to follow-up with our clinic in 3 weeks. She was informed of the importance of frequent follow-up visits to maximize her success with intensive lifestyle modifications for her multiple health conditions.   Objective:   Blood pressure 120/82, pulse 62, temperature 98.4 F (36.9 C), height '5\' 7"'$  (1.702 m), weight 282 lb (127.9 kg), last menstrual period 03/18/2008, SpO2 98 %. Body mass index is 44.17 kg/m.  General: Cooperative, alert, well developed, in no acute distress. HEENT: Conjunctivae and lids unremarkable. Cardiovascular: Regular rhythm.  Lungs: Normal work of breathing. Neurologic: No focal deficits.   Lab Results  Component Value Date   CREATININE 0.67 09/03/2022   BUN 13 09/03/2022   NA 137 09/03/2022   K 4.6 09/03/2022   CL 98 09/03/2022   CO2 20 09/03/2022   Lab Results  Component Value Date   ALT 28 09/03/2022   AST 25 09/03/2022   ALKPHOS 71 09/03/2022   BILITOT 0.4 09/03/2022   Lab Results  Component Value Date   HGBA1C 7.1 (H) 09/08/2022   HGBA1C 6.1 (H) 05/05/2022   HGBA1C 6.3 (H) 01/29/2022   HGBA1C 6.1 (H) 07/29/2021   HGBA1C 5.7 (H) 01/28/2021   Lab Results  Component Value Date   INSULIN 26.0 (H) 09/08/2022   INSULIN 9.7 09/17/2021   INSULIN 20.7 02/14/2020   Lab Results  Component Value Date   TSH 2.25 01/29/2022   Lab Results  Component Value Date   CHOL 131 01/29/2022   HDL 74 01/29/2022   LDLCALC 41 01/29/2022   TRIG 75 01/29/2022   CHOLHDL 1.8 01/29/2022   Lab Results   Component Value Date   VD25OH 30.0 09/08/2022   VD25OH 42 01/29/2022   VD25OH 31 07/29/2021   Lab Results  Component Value Date   WBC 7.0 09/03/2022   HGB 13.7 09/03/2022   HCT 40.7 09/03/2022   MCV 83 09/03/2022   PLT 266 09/03/2022   Lab Results  Component Value Date   IRON 60 01/14/2018   TIBC 398 01/14/2018   FERRITIN 22 01/14/2018   Attestation Statements:   Reviewed by clinician on day of visit: allergies, medications, problem list, medical history, surgical history, family history, social history, and previous encounter notes.  I have reviewed the above documentation for accuracy and completeness, and I agree with the above. -  Cadyn Fann d. Collie Kittel, NP-C

## 2022-11-05 ENCOUNTER — Other Ambulatory Visit: Payer: Medicare HMO

## 2022-11-05 DIAGNOSIS — E1165 Type 2 diabetes mellitus with hyperglycemia: Secondary | ICD-10-CM | POA: Diagnosis not present

## 2022-11-05 DIAGNOSIS — E1169 Type 2 diabetes mellitus with other specified complication: Secondary | ICD-10-CM | POA: Diagnosis not present

## 2022-11-05 DIAGNOSIS — E039 Hypothyroidism, unspecified: Secondary | ICD-10-CM

## 2022-11-05 DIAGNOSIS — E785 Hyperlipidemia, unspecified: Secondary | ICD-10-CM | POA: Diagnosis not present

## 2022-11-06 LAB — HEPATIC FUNCTION PANEL
AG Ratio: 1.6 (calc) (ref 1.0–2.5)
ALT: 20 U/L (ref 6–29)
AST: 18 U/L (ref 10–35)
Albumin: 4.2 g/dL (ref 3.6–5.1)
Alkaline phosphatase (APISO): 60 U/L (ref 37–153)
Bilirubin, Direct: 0.1 mg/dL (ref 0.0–0.2)
Globulin: 2.7 g/dL (calc) (ref 1.9–3.7)
Indirect Bilirubin: 0.5 mg/dL (calc) (ref 0.2–1.2)
Total Bilirubin: 0.6 mg/dL (ref 0.2–1.2)
Total Protein: 6.9 g/dL (ref 6.1–8.1)

## 2022-11-06 LAB — LIPID PANEL
Cholesterol: 104 mg/dL (ref <200)
HDL: 47 mg/dL — ABNORMAL LOW (ref >=50)
LDL Cholesterol (Calc): 37 mg/dL (calc)
Non-HDL Cholesterol (Calc): 57 mg/dL (calc) (ref <130)
Total CHOL/HDL Ratio: 2.2 (calc) (ref <5.0)
Triglycerides: 113 mg/dL (ref <150)

## 2022-11-06 LAB — TSH: TSH: 2.06 mIU/L (ref 0.40–4.50)

## 2022-11-06 LAB — HEMOGLOBIN A1C
Hgb A1c MFr Bld: 6.8 % of total Hgb — ABNORMAL HIGH (ref <5.7)
Mean Plasma Glucose: 148 mg/dL
eAG (mmol/L): 8.2 mmol/L

## 2022-11-06 LAB — MICROALBUMIN / CREATININE URINE RATIO
Creatinine, Urine: 179 mg/dL (ref 20–275)
Microalb Creat Ratio: 2 mcg/mg creat (ref <30)
Microalb, Ur: 0.4 mg/dL

## 2022-11-10 ENCOUNTER — Encounter: Payer: Self-pay | Admitting: Internal Medicine

## 2022-11-10 ENCOUNTER — Ambulatory Visit (INDEPENDENT_AMBULATORY_CARE_PROVIDER_SITE_OTHER): Payer: Medicare HMO | Admitting: Internal Medicine

## 2022-11-10 ENCOUNTER — Other Ambulatory Visit: Payer: Self-pay | Admitting: Cardiology

## 2022-11-10 VITALS — BP 120/76 | HR 69 | Temp 98.2°F | Resp 18 | Ht 67.0 in | Wt 285.0 lb

## 2022-11-10 DIAGNOSIS — Z96652 Presence of left artificial knee joint: Secondary | ICD-10-CM

## 2022-11-10 DIAGNOSIS — F419 Anxiety disorder, unspecified: Secondary | ICD-10-CM

## 2022-11-10 DIAGNOSIS — E1159 Type 2 diabetes mellitus with other circulatory complications: Secondary | ICD-10-CM

## 2022-11-10 DIAGNOSIS — E1169 Type 2 diabetes mellitus with other specified complication: Secondary | ICD-10-CM | POA: Diagnosis not present

## 2022-11-10 DIAGNOSIS — M1711 Unilateral primary osteoarthritis, right knee: Secondary | ICD-10-CM | POA: Diagnosis not present

## 2022-11-10 DIAGNOSIS — R69 Illness, unspecified: Secondary | ICD-10-CM | POA: Diagnosis not present

## 2022-11-10 DIAGNOSIS — Z23 Encounter for immunization: Secondary | ICD-10-CM | POA: Diagnosis not present

## 2022-11-10 DIAGNOSIS — I152 Hypertension secondary to endocrine disorders: Secondary | ICD-10-CM | POA: Diagnosis not present

## 2022-11-10 DIAGNOSIS — Z8719 Personal history of other diseases of the digestive system: Secondary | ICD-10-CM

## 2022-11-10 DIAGNOSIS — E039 Hypothyroidism, unspecified: Secondary | ICD-10-CM | POA: Diagnosis not present

## 2022-11-10 DIAGNOSIS — Z6841 Body Mass Index (BMI) 40.0 and over, adult: Secondary | ICD-10-CM

## 2022-11-10 DIAGNOSIS — E785 Hyperlipidemia, unspecified: Secondary | ICD-10-CM

## 2022-11-10 NOTE — Patient Instructions (Addendum)
It was a pleasure to see you today. RTC in 6 months. Continue diet and exercise efforts. Continue cardiology follow up. Vaccines discussed. Pneumococcal 20 vaccine given today. RTC in 6 months for medicare wellness visit.

## 2022-11-11 DIAGNOSIS — H43811 Vitreous degeneration, right eye: Secondary | ICD-10-CM | POA: Diagnosis not present

## 2022-11-11 DIAGNOSIS — H52203 Unspecified astigmatism, bilateral: Secondary | ICD-10-CM | POA: Diagnosis not present

## 2022-11-11 DIAGNOSIS — H524 Presbyopia: Secondary | ICD-10-CM | POA: Diagnosis not present

## 2022-11-11 DIAGNOSIS — E119 Type 2 diabetes mellitus without complications: Secondary | ICD-10-CM | POA: Diagnosis not present

## 2022-11-11 DIAGNOSIS — H25813 Combined forms of age-related cataract, bilateral: Secondary | ICD-10-CM | POA: Diagnosis not present

## 2022-11-11 LAB — HM DIABETES EYE EXAM

## 2022-11-12 ENCOUNTER — Encounter: Payer: Self-pay | Admitting: Internal Medicine

## 2022-11-16 ENCOUNTER — Ambulatory Visit: Payer: Medicare HMO | Admitting: Podiatry

## 2022-11-18 DIAGNOSIS — F411 Generalized anxiety disorder: Secondary | ICD-10-CM | POA: Diagnosis not present

## 2022-11-18 DIAGNOSIS — R69 Illness, unspecified: Secondary | ICD-10-CM | POA: Diagnosis not present

## 2022-11-19 ENCOUNTER — Ambulatory Visit: Payer: Medicare HMO | Admitting: Podiatry

## 2022-11-19 ENCOUNTER — Ambulatory Visit (INDEPENDENT_AMBULATORY_CARE_PROVIDER_SITE_OTHER): Payer: Medicare HMO

## 2022-11-19 ENCOUNTER — Encounter: Payer: Self-pay | Admitting: Podiatry

## 2022-11-19 DIAGNOSIS — M779 Enthesopathy, unspecified: Secondary | ICD-10-CM | POA: Diagnosis not present

## 2022-11-19 DIAGNOSIS — M2041 Other hammer toe(s) (acquired), right foot: Secondary | ICD-10-CM | POA: Diagnosis not present

## 2022-11-19 DIAGNOSIS — M2042 Other hammer toe(s) (acquired), left foot: Secondary | ICD-10-CM | POA: Diagnosis not present

## 2022-11-19 MED ORDER — TRIAMCINOLONE ACETONIDE 10 MG/ML IJ SUSP
10.0000 mg | Freq: Once | INTRAMUSCULAR | Status: AC
  Administered 2022-11-19: 10 mg

## 2022-11-20 NOTE — Progress Notes (Signed)
Subjective:   Patient ID: Mckenzie Hebert, female   DOB: 67 y.o.   MRN: [id number]   HPI Patient presents stating that she has pain at the top of both feet with the left being worse.  States she has had knee surgery and her right leg is now shorter than her left and she does get pain in both feet has done well from previous surgery that we had done a number of years ago.  Patient does not smoke likes to be active   Review of Systems  All other systems reviewed and are negative.       Objective:  Physical Exam Vitals and nursing note reviewed.  Constitutional:      Appearance: She is well-developed.  Pulmonary:     Effort: Pulmonary effort is normal.  Musculoskeletal:        General: Normal range of motion.  Skin:    General: Skin is warm.  Neurological:     Mental Status: She is alert.     Neurovascular status was found to be intact muscle strength is adequate mild swelling in the feet with history of knee replacement left and someday will have to have the right done with the right leg being about half inch shorter than the left.  Patient has inflammation pain of the dorsal tendon complex left and mild discomfort medial side right foot and does think she needs orthotics to try to help control her motion and lift up her right foot.  Good digital perfusion well-oriented     Assessment:  Tendinitis of the left dorsal foot with significant limb length discrepancy after having knee replacement left with pain     Plan:  H&P x-rays reviewed sterile prep injected the tendon complex left 3 mg Kenalog 5 mg Xylocaine and advised on orthotics to lift up the right leg and take pressure off both of her feet.  Patient is casted for functional orthotics all questions answered and will put a heel lift of the right side  X-rays indicate that there is no signs of advanced arthritis associated with conditions appears to be more soft tissue mild arthritis though still is noted

## 2022-11-23 ENCOUNTER — Ambulatory Visit (INDEPENDENT_AMBULATORY_CARE_PROVIDER_SITE_OTHER): Payer: Medicare HMO | Admitting: Adult Health

## 2022-11-26 ENCOUNTER — Encounter (INDEPENDENT_AMBULATORY_CARE_PROVIDER_SITE_OTHER): Payer: Self-pay | Admitting: Adult Health

## 2022-11-26 ENCOUNTER — Ambulatory Visit (INDEPENDENT_AMBULATORY_CARE_PROVIDER_SITE_OTHER): Payer: Medicare HMO | Admitting: Adult Health

## 2022-11-26 VITALS — BP 112/73 | HR 60 | Temp 98.2°F | Ht 67.0 in | Wt 277.0 lb

## 2022-11-26 DIAGNOSIS — Z7984 Long term (current) use of oral hypoglycemic drugs: Secondary | ICD-10-CM

## 2022-11-26 DIAGNOSIS — R5383 Other fatigue: Secondary | ICD-10-CM | POA: Diagnosis not present

## 2022-11-26 DIAGNOSIS — Z6841 Body Mass Index (BMI) 40.0 and over, adult: Secondary | ICD-10-CM

## 2022-11-26 DIAGNOSIS — E1165 Type 2 diabetes mellitus with hyperglycemia: Secondary | ICD-10-CM | POA: Diagnosis not present

## 2022-11-26 DIAGNOSIS — E669 Obesity, unspecified: Secondary | ICD-10-CM | POA: Diagnosis not present

## 2022-11-26 NOTE — Progress Notes (Signed)
WEIGHT SUMMARY AND BIOMETRICS  Vitals Temp: 98.2 F (36.8 C) BP: 112/73 Pulse Rate: 60 SpO2: 97 %   Anthropometric Measurements Height: 5\' 7"  (1.702 m) Weight: 277 lb (125.6 kg) BMI (Calculated): 43.37 Weight at Last Visit: 282lb Weight Lost Since Last Visit: 5lb Weight Gained Since Last Visit: 0 Starting Weight: 278lb Total Weight Loss (lbs): 9 lb (4.082 kg)   Body Composition  Body Fat %: 50.8 % Fat Mass (lbs): 141 lbs Muscle Mass (lbs): 129.6 lbs Total Body Water (lbs): 94.4 lbs Visceral Fat Rating : 18   Other Clinical Data Fasting: No Labs: No Today's Visit #: 10 Starting Date: 02/14/20    Chief Complaint:   OBESITY Mckenzie Hebert is here to discuss her progress with her obesity treatment plan. She is on the the Category 4 Plan and states she is following her eating plan approximately 90 % of the time.  She states she is not currently exercising.   Interim History:  6 month OV with PCP/Dr. Renold Genta 11/10/2022- Completed labs, no change to medications.  She has tolerated titration up to full tab Metformin 500mg  QD. A1c decreased 3 tenths of a point since starting.   Subjective:   1. Type 2 diabetes mellitus with hyperglycemia, without long-term current use of insulin (HCC) Lab Results  Component Value Date   HGBA1C 6.8 (H) 11/05/2022   HGBA1C 7.1 (H) 09/08/2022   HGBA1C 6.1 (H) 05/05/2022   09/22/2022 started on Metformin, titrated up to one full 500mg  tablet. She denies GI upset. She denies family hx of MEN 2 or MTC.  She denies personal hx of pancreatitis. Discussed risks/benefits of GLP-1 therapy, GIP/GLP-1 therapy. She would like to discuss these medications with her Cardiologist/Dr. Gardiner Rhyme. She has OV with Dr. Gardiner Rhyme 12/22/22.  2. Other fatigue  Latest Reference Range & Units 09/08/22 09:39  Vitamin D, 25-Hydroxy 30.0 - 100.0 ng/mL 30.0    Latest Reference Range & Units 11/05/22 09:16  TSH 0.40 - 4.50 mIU/L 2.06   Mckenzie Hebert endorses  current fatigue.  She will often take an hour long nap most afternoons. PCP manages daily Levothyroxine 133mcg She believes fatigue is r/t to BB therapy- she is currently on Toprol XL 25mg  for  Frequent PACs/PVCs   Assessment/Plan:   1. Type 2 diabetes mellitus with hyperglycemia, without long-term current use of insulin (HCC) Continue daily Metformin 500mg   Consider starting either Ozempic or Zepbound at next OV.  2. Other fatigue Increase protein Increase daily walking Increase water  3. Obesity, current BMI 43.37  Mckenzie Hebert is currently in the action stage of change. As such, her goal is to continue with weight loss efforts. She has agreed to the Category 4 Plan.   Exercise goals: Older adults should follow the adult guidelines. When older adults cannot meet the adult guidelines, they should be as physically active as their abilities and conditions will allow.  Older adults should do exercises that maintain or improve balance if they are at risk of falling.  Older adults with chronic conditions should understand whether and how their conditions affect their ability to do regular physical activity safely.  Behavioral modification strategies: increasing lean protein intake, decreasing simple carbohydrates, increasing vegetables, increasing water intake, and planning for success.  [name] has agreed to follow-up with our clinic in 3 weeks. She was informed of the importance of frequent follow-up visits to maximize her success with intensive lifestyle modifications for her multiple health conditions.   Objective:   Blood pressure 112/73, pulse  60, temperature 98.2 F (36.8 C), height 5\' 7"  (1.702 m), weight 277 lb (125.6 kg), last menstrual period 03/18/2008, SpO2 97 %. Body mass index is 43.38 kg/m.  General: Cooperative, alert, well developed, in no acute distress. HEENT: Conjunctivae and lids unremarkable. Cardiovascular: Regular rhythm.  Lungs: Normal work of breathing. Neurologic:  No focal deficits.   Lab Results  Component Value Date   CREATININE 0.67 09/03/2022   BUN 13 09/03/2022   NA 137 09/03/2022   K 4.6 09/03/2022   CL 98 09/03/2022   CO2 20 09/03/2022   Lab Results  Component Value Date   ALT 20 11/05/2022   AST 18 11/05/2022   ALKPHOS 71 09/03/2022   BILITOT 0.6 11/05/2022   Lab Results  Component Value Date   HGBA1C 6.8 (H) 11/05/2022   HGBA1C 7.1 (H) 09/08/2022   HGBA1C 6.1 (H) 05/05/2022   HGBA1C 6.3 (H) 01/29/2022   HGBA1C 6.1 (H) 07/29/2021   Lab Results  Component Value Date   INSULIN 26.0 (H) 09/08/2022   INSULIN 9.7 09/17/2021   INSULIN 20.7 02/14/2020   Lab Results  Component Value Date   TSH 2.06 11/05/2022   Lab Results  Component Value Date   CHOL 104 11/05/2022   HDL 47 (L) 11/05/2022   LDLCALC 37 11/05/2022   TRIG 113 11/05/2022   CHOLHDL 2.2 11/05/2022   Lab Results  Component Value Date   VD25OH 30.0 09/08/2022   VD25OH 42 01/29/2022   VD25OH 31 07/29/2021   Lab Results  Component Value Date   WBC 7.0 09/03/2022   HGB 13.7 09/03/2022   HCT 40.7 09/03/2022   MCV 83 09/03/2022   PLT 266 09/03/2022   Lab Results  Component Value Date   IRON 60 01/14/2018   TIBC 398 01/14/2018   FERRITIN 22 01/14/2018   Attestation Statements:   Reviewed by clinician on day of visit: allergies, medications, problem list, medical history, surgical history, family history, social history, and previous encounter notes.  Time spent on visit including pre-visit chart review and post-visit care and charting was 29 minutes.   I have reviewed the above documentation for accuracy and completeness, and I agree with the above. -  Garrie Elenes d. Sawyer Kahan, NP-C

## 2022-12-02 DIAGNOSIS — R69 Illness, unspecified: Secondary | ICD-10-CM | POA: Diagnosis not present

## 2022-12-02 DIAGNOSIS — F411 Generalized anxiety disorder: Secondary | ICD-10-CM | POA: Diagnosis not present

## 2022-12-07 ENCOUNTER — Other Ambulatory Visit (INDEPENDENT_AMBULATORY_CARE_PROVIDER_SITE_OTHER): Payer: Self-pay | Admitting: Adult Health

## 2022-12-07 DIAGNOSIS — E1165 Type 2 diabetes mellitus with hyperglycemia: Secondary | ICD-10-CM

## 2022-12-18 ENCOUNTER — Other Ambulatory Visit (INDEPENDENT_AMBULATORY_CARE_PROVIDER_SITE_OTHER): Payer: Self-pay | Admitting: Adult Health

## 2022-12-18 DIAGNOSIS — E1165 Type 2 diabetes mellitus with hyperglycemia: Secondary | ICD-10-CM

## 2022-12-20 NOTE — Progress Notes (Signed)
[facility] Office Note:    Date:  12/22/2022   ID:  Mckenzie Hebert, DOB [birth year], MRN [id number]  PCP:  Margaree Mackintosh, MD  Cardiologist:  None  Electrophysiologist:  None   Referring MD: Margaree Mackintosh, MD   Chief Complaint  Patient presents with   Dizziness     History of Present Illness:    Mckenzie Hebert is a 67 y.o. female with a hx of hypothyroidism, prediabetes, frequent PACs/PVCs who presents for follow-up.  She was referred by Dr. Lenord Fellers for evaluation of near syncopal episode, initially seen on 11/15/2020.  She works as a Community education officer and reports that on 10/10/2020 she was sitting down and felt lightheaded.  She denies any chest pain, dyspnea, palpitations.  States that she felt she was going to pass out, but turned her fan on and started taking deep breaths and symptoms improved.  She reports she lost 50 pounds from June to November last year.  Reports occasional chest pain, which she states occurs a couple times per year and describes as dull aching pain in the center of her chest that can last up to 30 minutes.  She denies any exertional chest pain.  States that she can walk up a flight of stairs without stopping, denies any chest pain or shortness of breath with this.  She smoked in her teens and 81s.  Family history includes mother had CABG and CHF, died during a nuclear stress test.  Lexiscan Myoview on 10/11/2015 showed normal perfusion, EF 64%.  Echocardiogram on 11/29/2020 showed normal biventricular function, no significant valvular disease.  Zio patch x14 days on 01/30/2021 showed frequent PVCs (10% of beats), frequent PACs (10%), 31 episodes of SVT with longest lasting 14 seconds.  Calcium score 353 (94th percentile) on 01/30/2021.  Repeat Zio patch x3 days on metoprolol showed 4 episodes of SVT (longest lasting 7 beats), frequent PACs (15% of beats), occasional PVCs (4% of beats).  Since last clinic visit, she reports she is doing well.  Has lost 8 pounds since  last appointment.  Denies any chest pain, dyspnea, lightheadedness, syncope, lower extremity edema, or palpitations.   Wt Readings from Last 3 Encounters:  12/22/22 284 lb 6.4 oz (129 kg)  11/26/22 277 lb (125.6 kg)  11/10/22 285 lb (129.3 kg)   BP Readings from Last 3 Encounters:  12/22/22 122/74  11/26/22 112/73  11/10/22 120/76       Past Medical History:  Diagnosis Date   AC (acromioclavicular) joint bone spurs 2008   right foot   Acid reflux    Anxiety    Asthma    Back pain    Back pain    Bilateral swelling of feet    Constipation    Dyspnea    Dysrhythmia 2019   PVC   Endometrial polyp    Endometrial polyp    Heartburn    History of colon polyps    Hypothyroidism    Joint pain    Joint pain    Migraines    Dr Clarisse Gouge   Osteoarthritis    PAC (premature atrial contraction)    Palpitations    cardiologist-  dr hochrein-- normal myoview 2017, holter 2015 mild ectopy   PONV (postoperative nausea and vomiting)    Pre-diabetes 2019   PVC (premature ventricular contraction)    SOB (shortness of breath)    Thyroid disease    Uterine fibroid    Vitamin D deficiency     Past Surgical History:  Procedure Laterality Date   APPENDECTOMY     CARDIOVASCULAR STRESS TEST  10-11-2015   dr hochrein   normal nuclear study w/ no ischemia/  normal LV function and wall motion, nuclear stress ef 64%   D & C HYSTERSCOPY W/ RESECTION POLYP  01-16-2010   dr gottsegen  @ [facility]   DILATATION & CURETTAGE/HYSTEROSCOPY WITH MYOSURE N/A 06/07/2018   Procedure: DILATATION & CURETTAGE/HYSTEROSCOPY WITH MYOSURE;  Surgeon: Dara Lords, MD;  Location: [facility];  Service: Gynecology;  Laterality: N/A;  request to follow first case in [place] [facility] block time  requests one hour   FOOT SURGERY  2002, 2008   bi-lat , bone spurs, achilles tendon work   HAMMER TOE SURGERY  11/2018   RESECTION TUMOR CLAVICLE RADICAL  1985   benign   TONSILLECTOMY     TOTAL KNEE  ARTHROPLASTY Left 12/09/2020   Procedure: TOTAL KNEE ARTHROPLASTY;  Surgeon: Ollen Gross, MD;  Location: [facility];  Service: Orthopedics;  Laterality: Left;     Current Medications: No outpatient medications have been marked as taking for the 12/22/22 encounter (Office Visit) with Little Ishikawa, MD.     Allergies:   Percocet [oxycodone-acetaminophen]   Social History   Socioeconomic History   Marital status: Widowed    Spouse name: Not on file   Number of children: Not on file   Years of education: Not on file   Highest education level: Not on file  Occupational History   Occupation: Histology    Employer: [employer]  Tobacco Use   Smoking status: Former    Types: Cigarettes    Quit date: 1973    Years since quitting: 51.3   Smokeless tobacco: Never  Vaping Use   Vaping Use: Never used  Substance and Sexual Activity   Alcohol use: No    Alcohol/week: 0.0 standard drinks of alcohol   Drug use: No   Sexual activity: Not Currently    Comment: INTERCOURSE AGE 53, SEXUAL PARTNERS LESS THAN 5  Other Topics Concern   Not on file  Social History Narrative   Lives alone.     Social Determinants of Health   Financial Resource Strain: Not on file  Food Insecurity: Not on file  Transportation Needs: Not on file  Physical Activity: Not on file  Stress: Not on file  Social Connections: Not on file     Family History: The patient'sfamily history includes Alcohol abuse in her father; Autoimmune disease in her sister; Breast cancer in her paternal aunt; Cancer in her father; Depression in her father; Diabetes in her mother and sister; Heart disease (age of onset: 28) in her mother; Hyperlipidemia in her mother; Hypertension in her mother, sister, and sister; Leukemia in her paternal grandmother; Mental retardation in an other family member; Stroke in her mother; Thyroid disease in her sister.  ROS:   Please see the history of present illness.    All other  systems reviewed and are negative.  EKGs/Labs/Other Studies Reviewed:    The following studies were reviewed today:   EKG:   08/26/2022: Normal sinus rhythm, incomplete right bundle branch block, right axis deviation, rate 68  Recent Labs: 09/03/2022: BUN 13; Creatinine, Ser 0.67; Hemoglobin 13.7; Platelets 266; Potassium 4.6; Sodium 137 11/05/2022: ALT 20; TSH 2.06  Recent Lipid Panel    Component Value Date/Time   CHOL 104 11/05/2022 0916   CHOL 132 07/04/2021 1609   TRIG 113 11/05/2022 0916   HDL 47 (  L) 11/05/2022 0916   HDL 63 07/04/2021 1609   CHOLHDL 2.2 11/05/2022 0916   VLDL 17 05/12/2016 0933   LDLCALC 37 11/05/2022 0916    Physical Exam:    VS:  BP 122/74 (BP Location: Left Arm, Patient Position: Sitting, Cuff Size: Large)   Pulse 72   Ht  (1.702 m)   Wt 284 lb 6.4 oz (129 kg)   LMP 03/18/2008 (LMP Unknown)   SpO2 94%   BMI 44.54 kg/m     Wt Readings from Last 3 Encounters:  12/22/22 284 lb 6.4 oz (129 kg)  11/26/22 277 lb (125.6 kg)  11/10/22 285 lb (129.3 kg)     GEN: Well nourished, well developed in no acute distress HEENT: Normal NECK: No JVD; No carotid bruits CARDIAC: Irregular, normal rate, no murmurs RESPIRATORY:  Clear to auscultation without rales, wheezing or rhonchi  ABDOMEN: Soft, non-tender, non-distended MUSCULOSKELETAL:  No edema; No deformity  SKIN: Warm and dry NEUROLOGIC:  Alert and oriented x 3 PSYCHIATRIC:  Normal affect   ASSESSMENT:    1. Near syncope   2. Morbid obesity   3. Premature atrial contractions   4. PVC's (premature ventricular contractions)   5. Essential hypertension   6. Dilatation of aorta   7. Hyperlipidemia, unspecified hyperlipidemia type      PLAN:    Near syncope: Unclear cause.  Echocardiogram on 11/29/2020 showed normal biventricular function, no significant valvular disease.  Zio patch x14 days on 01/30/2021 showed frequent PVCs (10% of beats), frequent PACs (10%), 31 episodes of SVT with  longest lasting 14 seconds.   -Continue Toprol-XL  Frequent PACs/PVCs: Zio patch x14 days on 01/30/2021 showed frequent PVCs (10% of beats), frequent PACs (10%), 31 episodes of SVT with longest lasting 14 seconds.  Repeat Zio patch x3 days on metoprolol showed 4 episodes of SVT (longest lasting 7 beats), frequent PACs (15% of beats), occasional PVCs (4% of beats). -Continue Toprol-XL.  She reports significant fatigue on 37.5 mg daily, reduced dose back to 25 mg daily  Hyperlipidemia: On Zetia 10 mg daily.  LDL 127 on 01/28/21.  10-year ASCVD risk score 5%.  Calcium score 353 (94th percentile) on 01/30/2021.  Started rosuvastatin 10 mg daily.  LDL 37 on 11/05/22  Hypertension: On Toprol-XL 25 mg daily and losartan 25 mg daily.  Appears  Dilated aorta: Dilated ascending aorta measured 41 mm on calcium score 02/27/2021.  CTA chest 03/2022 showed no aortic aneurysm  Daytime somnolence: Sleep study 05/09/2021 showed mild OSA, no indication for CPAP at this time  Morbid obesity: Body mass index is 44.54 kg/m.  Previously lost 30 pounds following with healthy weight and wellness but has gained weight back.  Has reestablished with healthy weight and wellness and lost 8 pounds over the last 4 months  Pulmonary nodule: 5 mm pulmonary nodule in CTA chest 03/2022.  Given lack of risk factors, no follow-up needed   RTC in 6 months   Medication Adjustments/Labs and Tests Ordered: Current medicines are reviewed at length with the patient today.  Concerns regarding medicines are outlined above.  No orders of the defined types were placed in this encounter.   No orders of the defined types were placed in this encounter.     Patient Instructions  Medication Instructions:  Your physician recommends that you continue on your current medications as directed. Please refer to the Current Medication list given to you today.  *If you need a refill on your cardiac medications before your  next appointment, please  call your pharmacy*  Follow-Up: At [facility], you and your health needs are our priority.  As part of our continuing mission to provide you with exceptional heart care, we have created designated Provider Care Teams.  These Care Teams include your primary Cardiologist (physician) and Advanced Practice Providers (APPs -  Physician Assistants and Nurse Practitioners) who all work together to provide you with the care you need, when you need it.  We recommend signing up for the patient portal called "MyChart".  Sign up information is provided on this After Visit Summary.  MyChart is used to connect with patients for Virtual Visits (Telemedicine).  Patients are able to view lab/test results, encounter notes, upcoming appointments, etc.  Non-urgent messages can be sent to your provider as well.   To learn more about what you can do with MyChart, go to [email or web address].    Your next appointment:   6 month(s)  Provider:   Dr. Bjorn Pippin    Signed, Little Ishikawa, MD  12/22/2022 5:36 PM    [facility] apnea you

## 2022-12-22 ENCOUNTER — Ambulatory Visit: Payer: Medicare HMO | Attending: Cardiology | Admitting: Cardiology

## 2022-12-22 ENCOUNTER — Encounter: Payer: Self-pay | Admitting: Cardiology

## 2022-12-22 VITALS — BP 122/74 | HR 72 | Ht 67.0 in | Wt 284.4 lb

## 2022-12-22 DIAGNOSIS — I493 Ventricular premature depolarization: Secondary | ICD-10-CM

## 2022-12-22 DIAGNOSIS — E785 Hyperlipidemia, unspecified: Secondary | ICD-10-CM

## 2022-12-22 DIAGNOSIS — I10 Essential (primary) hypertension: Secondary | ICD-10-CM | POA: Diagnosis not present

## 2022-12-22 DIAGNOSIS — R55 Syncope and collapse: Secondary | ICD-10-CM | POA: Diagnosis not present

## 2022-12-22 DIAGNOSIS — I491 Atrial premature depolarization: Secondary | ICD-10-CM

## 2022-12-22 DIAGNOSIS — I77819 Aortic ectasia, unspecified site: Secondary | ICD-10-CM | POA: Diagnosis not present

## 2022-12-22 NOTE — Patient Instructions (Signed)
Medication Instructions:  Your physician recommends that you continue on your current medications as directed. Please refer to the Current Medication list given to you today.  *If you need a refill on your cardiac medications before your next appointment, please call your pharmacy*  Follow-Up: At [facility], you and your health needs are our priority.  As part of our continuing mission to provide you with exceptional heart care, we have created designated Provider Care Teams.  These Care Teams include your primary Cardiologist (physician) and Advanced Practice Providers (APPs -  Physician Assistants and Nurse Practitioners) who all work together to provide you with the care you need, when you need it.  We recommend signing up for the patient portal called "MyChart".  Sign up information is provided on this After Visit Summary.  MyChart is used to connect with patients for Virtual Visits (Telemedicine).  Patients are able to view lab/test results, encounter notes, upcoming appointments, etc.  Non-urgent messages can be sent to your provider as well.   To learn more about what you can do with MyChart, go to [email or web address].    Your next appointment:   6 month(s)  Provider:   Dr. Schumann  

## 2022-12-23 ENCOUNTER — Encounter (INDEPENDENT_AMBULATORY_CARE_PROVIDER_SITE_OTHER): Payer: Self-pay | Admitting: Adult Health

## 2022-12-23 ENCOUNTER — Ambulatory Visit (INDEPENDENT_AMBULATORY_CARE_PROVIDER_SITE_OTHER): Payer: Medicare HMO | Admitting: Adult Health

## 2022-12-23 VITALS — BP 119/65 | HR 72 | Temp 98.2°F | Ht 67.0 in | Wt 280.0 lb

## 2022-12-23 DIAGNOSIS — Z6839 Body mass index (BMI) 39.0-39.9, adult: Secondary | ICD-10-CM

## 2022-12-23 DIAGNOSIS — Z7984 Long term (current) use of oral hypoglycemic drugs: Secondary | ICD-10-CM

## 2022-12-23 DIAGNOSIS — E559 Vitamin D deficiency, unspecified: Secondary | ICD-10-CM

## 2022-12-23 DIAGNOSIS — E1165 Type 2 diabetes mellitus with hyperglycemia: Secondary | ICD-10-CM | POA: Diagnosis not present

## 2022-12-23 DIAGNOSIS — E669 Obesity, unspecified: Secondary | ICD-10-CM

## 2022-12-23 DIAGNOSIS — Z6841 Body Mass Index (BMI) 40.0 and over, adult: Secondary | ICD-10-CM | POA: Diagnosis not present

## 2022-12-23 MED ORDER — METFORMIN HCL 500 MG PO TABS: ORAL_TABLET | ORAL | 0 refills | Status: DC

## 2022-12-23 MED ORDER — VITAMIN D (ERGOCALCIFEROL) 1.25 MG (50000 UNIT) PO CAPS: 50000.0000 [IU] | ORAL_CAPSULE | ORAL | 0 refills | Status: DC

## 2022-12-23 MED ORDER — BLOOD GLUCOSE MONITOR KIT: 1.0000 | PACK | Freq: Every day | 0 refills | Status: AC | PRN

## 2022-12-23 NOTE — Progress Notes (Signed)
WEIGHT SUMMARY AND BIOMETRICS  Vitals Temp: 98.2 F (36.8 C) BP: 119/65 Pulse Rate: 72 SpO2: 96 %   Anthropometric Measurements Height:  (1.702 m) Weight: 280 lb (127 kg) BMI (Calculated): 43.84 Weight at Last Visit: 277lb Weight Lost Since Last Visit: 0 Weight Gained Since Last Visit: 3lb Starting Weight: 278lb Total Weight Loss (lbs): 6 lb (2.722 kg)   Body Composition  Body Fat %: 52 % Fat Mass (lbs): 145.8 lbs Muscle Mass (lbs): 127.6 lbs Total Body Water (lbs): 98.6 lbs Visceral Fat Rating : 19   Other Clinical Data Fasting: No Labs: no Today's Visit #: 11 Starting Date: 02/14/20    Chief Complaint:   OBESITY Mckenzie Hebert is here to discuss her progress with her obesity treatment plan. She is on the the Category 4 Plan and states she is following her eating plan approximately 50 % of the time.  She states she is not currently exercising.   Interim History:  Mckenzie Hebert reports that following Breakfast, Lunch, and snacks are easy. She often deviates at dinner in the way of the "fast food". Since she lost her husband, she will often "just grab something easy".  She reports being quite successful with Weight Watchers and losing up to 80 lbs with the "Point System".  Subjective:   1. Vitamin D deficiency  Latest Reference Range & Units 09/08/22 09:39  Vitamin D, 25-Hydroxy 30.0 - 100.0 ng/mL 30.0  She is currently on daily OTC Vit D3 4,000 IU with significant fatigue. She is struggling to find a routine in retirement. Previously intolerant to Ergocalciferol- willing to try again.  2. Type 2 diabetes mellitus with hyperglycemia, without long-term current use of insulin Lab Results  Component Value Date   HGBA1C 6.8 (H) 11/05/2022   HGBA1C 7.1 (H) 09/08/2022   HGBA1C 6.1 (H) 05/05/2022   She is currently on Metformin  QD- denies GI upset. She had recent OV with her Cardiologist/ Dr. Bjorn Pippin on 12/22/2022- no change to cardiac meds. He did  not offer an opinion on GLP-1 therapy.   Assessment/Plan:   1. Vitamin D deficiency Stop OTC supplement Start Ergocalcifero 50,000 IU Disp 4 RF 0  2. Type 2 diabetes mellitus with hyperglycemia, without long-term current use of insulin Refill - metFORMIN (GLUCOPHAGE) 500 MG tablet; 1 tab with breakfast daily  Dispense: 30 tablet; Refill: 0  3. Obesity, current BMI 43.37  Mckenzie Hebert is currently in the action stage of change. As such, her goal is to continue with weight loss efforts. She has agreed to the Category 4 Plan.   Handout: Eating Out Guide  Exercise goals: Older adults should follow the adult guidelines. When older adults cannot meet the adult guidelines, they should be as physically active as their abilities and conditions will allow.  Older adults should determine their level of effort for physical activity relative to their level of fitness.  Older adults with chronic conditions should understand whether and how their conditions affect their ability to do regular physical activity safely.  Behavioral modification strategies: increasing lean protein intake, decreasing simple carbohydrates, increasing vegetables, increasing water intake, decreasing eating out, no skipping meals, meal planning and cooking strategies, keeping healthy foods in the home, better snacking choices, and planning for success.  Mckenzie Hebert has agreed to follow-up with our clinic in 4 weeks. She was informed of the importance of frequent follow-up visits to maximize her success with intensive lifestyle modifications for her multiple health conditions.   Objective:   Blood  pressure 119/65, pulse 72, temperature 98.2 F (36.8 C), height  (1.702 m), weight 280 lb (127 kg), last menstrual period 03/18/2008, SpO2 96 %. Body mass index is 43.85 kg/m.  General: Cooperative, alert, well developed, in no acute distress. HEENT: Conjunctivae and lids unremarkable. Cardiovascular: Regular rhythm.  Lungs: Normal work  of breathing. Neurologic: No focal deficits.   Lab Results  Component Value Date   CREATININE 0.67 09/03/2022   BUN 13 09/03/2022   NA 137 09/03/2022   K 4.6 09/03/2022   CL 98 09/03/2022   CO2 20 09/03/2022   Lab Results  Component Value Date   ALT 20 11/05/2022   AST 18 11/05/2022   ALKPHOS 71 09/03/2022   BILITOT 0.6 11/05/2022   Lab Results  Component Value Date   HGBA1C 6.8 (H) 11/05/2022   HGBA1C 7.1 (H) 09/08/2022   HGBA1C 6.1 (H) 05/05/2022   HGBA1C 6.3 (H) 01/29/2022   HGBA1C 6.1 (H) 07/29/2021   Lab Results  Component Value Date   INSULIN 26.0 (H) 09/08/2022   INSULIN 9.7 09/17/2021   INSULIN 20.7 02/14/2020   Lab Results  Component Value Date   TSH 2.06 11/05/2022   Lab Results  Component Value Date   CHOL 104 11/05/2022   HDL 47 (L) 11/05/2022   LDLCALC 37 11/05/2022   TRIG 113 11/05/2022   CHOLHDL 2.2 11/05/2022   Lab Results  Component Value Date   VD25OH 30.0 09/08/2022   VD25OH 42 01/29/2022   VD25OH 31 07/29/2021   Lab Results  Component Value Date   WBC 7.0 09/03/2022   HGB 13.7 09/03/2022   HCT 40.7 09/03/2022   MCV 83 09/03/2022   PLT 266 09/03/2022   Lab Results  Component Value Date   IRON 60 01/14/2018   TIBC 398 01/14/2018   FERRITIN 22 01/14/2018   Attestation Statements:   Reviewed by clinician on day of visit: allergies, medications, problem list, medical history, surgical history, family history, social history, and previous encounter notes.  I have reviewed the above documentation for accuracy and completeness, and I agree with the above. -  Tylee Yum d. Ashani Pumphrey, NP-C

## 2022-12-24 ENCOUNTER — Ambulatory Visit (INDEPENDENT_AMBULATORY_CARE_PROVIDER_SITE_OTHER): Payer: Medicare HMO | Admitting: Adult Health

## 2022-12-24 DIAGNOSIS — L821 Other seborrheic keratosis: Secondary | ICD-10-CM | POA: Diagnosis not present

## 2022-12-24 DIAGNOSIS — D23122 Other benign neoplasm of skin of left lower eyelid, including canthus: Secondary | ICD-10-CM | POA: Diagnosis not present

## 2022-12-30 ENCOUNTER — Telehealth: Payer: Self-pay | Admitting: Podiatry

## 2022-12-30 NOTE — Telephone Encounter (Signed)
Mckenzie Hebert with Mckenzie Hebert called gave reference number [id number] as authorization number for orthotics  this will expire on 06/26/2023

## 2023-01-15 ENCOUNTER — Other Ambulatory Visit (INDEPENDENT_AMBULATORY_CARE_PROVIDER_SITE_OTHER): Payer: Self-pay | Admitting: Adult Health

## 2023-01-15 DIAGNOSIS — E1165 Type 2 diabetes mellitus with hyperglycemia: Secondary | ICD-10-CM

## 2023-01-20 ENCOUNTER — Ambulatory Visit (INDEPENDENT_AMBULATORY_CARE_PROVIDER_SITE_OTHER): Payer: Medicare HMO

## 2023-01-20 ENCOUNTER — Encounter (INDEPENDENT_AMBULATORY_CARE_PROVIDER_SITE_OTHER): Payer: Self-pay | Admitting: Adult Health

## 2023-01-20 ENCOUNTER — Ambulatory Visit (INDEPENDENT_AMBULATORY_CARE_PROVIDER_SITE_OTHER): Payer: Medicare HMO | Admitting: Adult Health

## 2023-01-20 VITALS — BP 105/72 | HR 74 | Temp 98.3°F | Ht 67.0 in | Wt 281.0 lb

## 2023-01-20 DIAGNOSIS — Z6841 Body Mass Index (BMI) 40.0 and over, adult: Secondary | ICD-10-CM | POA: Diagnosis not present

## 2023-01-20 DIAGNOSIS — E1165 Type 2 diabetes mellitus with hyperglycemia: Secondary | ICD-10-CM | POA: Diagnosis not present

## 2023-01-20 DIAGNOSIS — Z7985 Long-term (current) use of injectable non-insulin antidiabetic drugs: Secondary | ICD-10-CM | POA: Diagnosis not present

## 2023-01-20 DIAGNOSIS — M2042 Other hammer toe(s) (acquired), left foot: Secondary | ICD-10-CM

## 2023-01-20 DIAGNOSIS — M2041 Other hammer toe(s) (acquired), right foot: Secondary | ICD-10-CM

## 2023-01-20 DIAGNOSIS — Z7984 Long term (current) use of oral hypoglycemic drugs: Secondary | ICD-10-CM

## 2023-01-20 DIAGNOSIS — E669 Obesity, unspecified: Secondary | ICD-10-CM

## 2023-01-20 DIAGNOSIS — E559 Vitamin D deficiency, unspecified: Secondary | ICD-10-CM

## 2023-01-20 MED ORDER — SEMAGLUTIDE(0.25 OR 0.5MG/DOS) 2 MG/3ML ~~LOC~~ SOPN: 0.2500 mg | PEN_INJECTOR | SUBCUTANEOUS | 0 refills | Status: DC

## 2023-01-20 MED ORDER — METFORMIN HCL 500 MG PO TABS: ORAL_TABLET | ORAL | 0 refills | Status: DC

## 2023-01-20 MED ORDER — TRUE METRIX AIR GLUCOSE METER DEVI
1.0000 | Freq: Every day | Status: AC
Start: 2023-01-20 — End: ?

## 2023-01-20 MED ORDER — TRUE METRIX AIR GLUCOSE METER DEVI: 1.0000 | Freq: Every day | Status: DC

## 2023-01-20 MED ORDER — LANCETS 28G MISC
1.0000 | Freq: Every day | 0 refills | Status: AC
Start: 2023-01-20 — End: ?

## 2023-01-20 MED ORDER — VITAMIN D (ERGOCALCIFEROL) 1.25 MG (50000 UNIT) PO CAPS: 50000.0000 [IU] | ORAL_CAPSULE | ORAL | 0 refills | Status: DC

## 2023-01-20 MED ORDER — TRUE METRIX BLOOD GLUCOSE TEST VI STRP: ORAL_STRIP | 12 refills | Status: DC

## 2023-01-20 NOTE — Progress Notes (Signed)
Patient presents today to pick up custom molded foot orthotics recommended by Dr. REGAL.   Orthotics were dispensed and fit was satisfactory. Reviewed instructions for break-in and wear. Written instructions given to patient.  Patient will follow up as needed.    

## 2023-01-20 NOTE — Progress Notes (Signed)
WEIGHT SUMMARY AND BIOMETRICS  Vitals Temp: 98.3 F (36.8 C) BP: 105/72 Pulse Rate: 74 SpO2: 95 %   Anthropometric Measurements Height: 5\' 7"  (1.702 m) Weight: 281 lb (127.5 kg) BMI (Calculated): 44 Weight at Last Visit: 280lb Weight Lost Since Last Visit: 0 Weight Gained Since Last Visit: 1lb Starting Weight: 278lb Total Weight Loss (lbs): 5 lb (2.268 kg)   Body Composition  Body Fat %: 52.4 % Fat Mass (lbs): 147.4 lbs Muscle Mass (lbs): 127 lbs Total Body Water (lbs): 100.2 lbs Visceral Fat Rating : 19   Other Clinical Data Fasting: no Labs: no Today's Visit #: 12 Starting Date: 02/14/20    Chief Complaint:   OBESITY Mckenzie Hebert is here to discuss her progress with her obesity treatment plan. She is on the the Category 4 Plan and states she is following her eating plan approximately 80 % of the time. She states she is not currently exercising at this time.   Interim History:  [name] provided home CBG readings: Fasting- predominantly 120-130 with a few 140s,150s PP 150s She is currently on daily Metformin 500mg - tolerating well. She is interested in GLP-1 therapy- risks/benefits discussed.  Hunger/appetite-she endorses intermittent polyphagia and emotional eating throughout the day.  Stress- she denies acute stress or increase in anxiety.  Exercise-weather permitting, she walks her dogs outside.  Subjective:   1. Type 2 diabetes mellitus with hyperglycemia, without long-term current use of insulin (HCC) Lab Results  Component Value Date   HGBA1C 6.8 (H) 11/05/2022   HGBA1C 7.1 (H) 09/08/2022   HGBA1C 6.1 (H) 05/05/2022     Latest Reference Range & Units 09/03/22 11:58  eGFR >59 mL/min/1.73 96   She is currently on Metformin 500mg  QD- denies GI upset. She endorses intermittent polyphagia and emotional eating throughout the day. She denies family hx of MEN 2 or MTC. She denies personal hx of pancreatitis.  2. Vitamin D deficiency   Latest Reference Range & Units 01/29/22 09:16 09/08/22 09:39  Vitamin D, 25-Hydroxy 30.0 - 100.0 ng/mL 42 30.0   She is on weekly Ergocalciferol- denies N/V/Muscle Weakness.  Assessment/Plan:   1. Type 2 diabetes mellitus with hyperglycemia, without long-term current use of insulin (HCC) Refill - metFORMIN (GLUCOPHAGE) 500 MG tablet; 1 tab with breakfast daily  Dispense: 30 tablet; Refill: 0 Start Semaglutide,0.25 or 0.5MG /DOS, 2 MG/3ML SOPN Inject 0.25 mg into the skin once a week. Dispense: 3 mL, Refills: 0 ordered   Watched Ozempic injection video with pt- answered all administration questions.  2. Vitamin D deficiency Refill  Vitamin D, Ergocalciferol, (DRISDOL) 1.25 MG (50000 UNIT) CAPS capsule Take 1 capsule (50,000 Units total) by mouth every 7 (seven) days. Dispense: 8 capsule, Refills: 0 ordered   3. Obesity, current BMI 43.37  Mckenzie Hebert is currently in the action stage of change. As such, her goal is to continue with weight loss efforts. She has agreed to the Category 4 Plan.   Exercise goals: Older adults should follow the adult guidelines. When older adults cannot meet the adult guidelines, they should be as physically active as their abilities and conditions will allow.  Older adults should do exercises that maintain or improve balance if they are at risk of falling.  Older adults should determine their level of effort for physical activity relative to their level of fitness.   Behavioral modification strategies: increasing lean protein intake, decreasing simple carbohydrates, increasing vegetables, increasing water intake, meal planning and cooking strategies, keeping healthy foods in the home,  and planning for success.  Kaeya has agreed to follow-up with our clinic in 3 weeks. She was informed of the importance of frequent follow-up visits to maximize her success with intensive lifestyle modifications for her multiple health conditions.   Objective:   Blood pressure 105/72,  pulse 74, temperature 98.3 F (36.8 C), height 5\' 7"  (1.702 m), weight 281 lb (127.5 kg), last menstrual period 03/18/2008, SpO2 95 %. Body mass index is 44.01 kg/m.  General: Cooperative, alert, well developed, in no acute distress. HEENT: Conjunctivae and lids unremarkable. Cardiovascular: Regular rhythm.  Lungs: Normal work of breathing. Neurologic: No focal deficits.   Lab Results  Component Value Date   CREATININE 0.67 09/03/2022   BUN 13 09/03/2022   NA 137 09/03/2022   K 4.6 09/03/2022   CL 98 09/03/2022   CO2 20 09/03/2022   Lab Results  Component Value Date   ALT 20 11/05/2022   AST 18 11/05/2022   ALKPHOS 71 09/03/2022   BILITOT 0.6 11/05/2022   Lab Results  Component Value Date   HGBA1C 6.8 (H) 11/05/2022   HGBA1C 7.1 (H) 09/08/2022   HGBA1C 6.1 (H) 05/05/2022   HGBA1C 6.3 (H) 01/29/2022   HGBA1C 6.1 (H) 07/29/2021   Lab Results  Component Value Date   INSULIN 26.0 (H) 09/08/2022   INSULIN 9.7 09/17/2021   INSULIN 20.7 02/14/2020   Lab Results  Component Value Date   TSH 2.06 11/05/2022   Lab Results  Component Value Date   CHOL 104 11/05/2022   HDL 47 (L) 11/05/2022   LDLCALC 37 11/05/2022   TRIG 113 11/05/2022   CHOLHDL 2.2 11/05/2022   Lab Results  Component Value Date   VD25OH 30.0 09/08/2022   VD25OH 42 01/29/2022   VD25OH 31 07/29/2021   Lab Results  Component Value Date   WBC 7.0 09/03/2022   HGB 13.7 09/03/2022   HCT 40.7 09/03/2022   MCV 83 09/03/2022   PLT 266 09/03/2022   Lab Results  Component Value Date   IRON 60 01/14/2018   TIBC 398 01/14/2018   FERRITIN 22 01/14/2018   Attestation Statements:   Reviewed by clinician on day of visit: allergies, medications, problem list, medical history, surgical history, family history, social history, and previous encounter notes.  I have reviewed the above documentation for accuracy and completeness, and I agree with the above. -  Lydell Moga d. Dakia Schifano, NP-C

## 2023-01-29 ENCOUNTER — Other Ambulatory Visit: Payer: Self-pay | Admitting: Cardiology

## 2023-01-29 ENCOUNTER — Other Ambulatory Visit: Payer: Self-pay | Admitting: Internal Medicine

## 2023-02-09 ENCOUNTER — Encounter (INDEPENDENT_AMBULATORY_CARE_PROVIDER_SITE_OTHER): Payer: Self-pay | Admitting: Adult Health

## 2023-02-09 ENCOUNTER — Ambulatory Visit (INDEPENDENT_AMBULATORY_CARE_PROVIDER_SITE_OTHER): Payer: Medicare HMO | Admitting: Adult Health

## 2023-02-09 VITALS — BP 104/58 | HR 58 | Temp 98.5°F | Ht 67.0 in | Wt 275.0 lb

## 2023-02-09 DIAGNOSIS — Z7985 Long-term (current) use of injectable non-insulin antidiabetic drugs: Secondary | ICD-10-CM | POA: Diagnosis not present

## 2023-02-09 DIAGNOSIS — E1165 Type 2 diabetes mellitus with hyperglycemia: Secondary | ICD-10-CM | POA: Diagnosis not present

## 2023-02-09 DIAGNOSIS — E669 Obesity, unspecified: Secondary | ICD-10-CM

## 2023-02-09 DIAGNOSIS — Z6841 Body Mass Index (BMI) 40.0 and over, adult: Secondary | ICD-10-CM

## 2023-02-09 DIAGNOSIS — Z7984 Long term (current) use of oral hypoglycemic drugs: Secondary | ICD-10-CM

## 2023-02-09 DIAGNOSIS — E559 Vitamin D deficiency, unspecified: Secondary | ICD-10-CM

## 2023-02-09 MED ORDER — VITAMIN D (ERGOCALCIFEROL) 1.25 MG (50000 UNIT) PO CAPS: 50000.0000 [IU] | ORAL_CAPSULE | ORAL | 0 refills | Status: DC

## 2023-02-09 NOTE — Progress Notes (Signed)
WEIGHT SUMMARY AND BIOMETRICS  Vitals Temp: 98.5 F (36.9 C) BP: (!) 104/58 Pulse Rate: (!) 58 SpO2: 94 %   Anthropometric Measurements Height: 5\' 7"  (1.702 m) Weight: 275 lb (124.7 kg) BMI (Calculated): 43.06 Weight at Last Visit: 281 lb Weight Lost Since Last Visit: 6 Weight Gained Since Last Visit: 0 Starting Weight: 278 lb Total Weight Loss (lbs): 3 lb (1.361 kg)   Body Composition  Body Fat %: 51.7 % Fat Mass (lbs): 142.2 lbs Muscle Mass (lbs): 126.2 lbs Total Body Water (lbs): 100.4 lbs Visceral Fat Rating : 18   Other Clinical Data Fasting: no Labs: no Today's Visit #: 13 Starting Date: 02/14/20    Chief Complaint:   OBESITY Mckenzie Hebert is here to discuss her progress with her obesity treatment plan. She is on the the Category 4 Plan and states she is following her eating plan approximately 85 % of the time. She states she is exercising Walking 4 x week.   Interim History:  First month of Ozempic therapy cost $94 She is now in Medicare Part [place] "[place]" next refill of Ozempic will cost >$300 She will be unable to afford additional refills. Denies mass in neck, dysphagia, dyspepsia, persistent hoarseness, abdominal pain, or N/V/C  She remained on daily Metformin 500mg    Reviewed [employer] Results with pt: Muscle Mass: -0.8 lb Adipose Mass: -5.2 lbs  Subjective:   1. Type 2 diabetes mellitus with hyperglycemia, without long-term current use of insulin (HCC) Lab Results  Component Value Date   HGBA1C 6.8 (H) 11/05/2022   HGBA1C 7.1 (H) 09/08/2022   HGBA1C 6.1 (H) 05/05/2022   She has continued on daily Metformin 500mg  at breakfast. Started Ozempic 0.25mg - has had one injection Denies mass in neck, dysphagia, dyspepsia, persistent hoarseness, abdominal pain, or N/V/C  Fasting CBG <130s She denies sx's of hypoglycemia First month of Ozempic therapy cost $94 She is now in Medicare Part D "[place]" next refill of Ozempic will cost  >$300 She will be unable to afford additional refills.  2. Vitamin D deficiency  Latest Reference Range & Units 01/29/22 09:16 09/08/22 09:39  Vitamin D, 25-Hydroxy 30.0 - 100.0 ng/mL 42 30.0  She is on weekly Ergocalciferol- denies N/V/Muscle Weakness Monthly Ergocalciferol- cost >$20- requests 90 day refill She endorses persistent fatigue  Assessment/Plan:   1. Type 2 diabetes mellitus with hyperglycemia, without long-term current use of insulin (HCC) Remain on daily Metformin 500mg  Keep supply of Ozempic- restart when out of "[facility]"  2. Vitamin D deficiency Refill  Vitamin D, Ergocalciferol, (DRISDOL) 1.25 MG (50000 UNIT) CAPS capsule Take 1 capsule (50,000 Units total) by mouth every 7 (seven) days. Dispense: 12 capsule, Refills: 0 ordered   3. Obesity, current BMI 43.1  Mckenzie Hebert is currently in the action stage of change. As such, her goal is to continue with weight loss efforts. She has agreed to the Category 4 Plan.   Exercise goals: Older adults should follow the adult guidelines. When older adults cannot meet the adult guidelines, they should be as physically active as their abilities and conditions will allow.  Older adults should do exercises that maintain or improve balance if they are at risk of falling.  Older adults with chronic conditions should understand whether and how their conditions affect their ability to do regular physical activity safely.  Behavioral modification strategies: increasing lean protein intake, decreasing simple carbohydrates, increasing vegetables, increasing water intake, no skipping meals, meal planning and cooking strategies, and planning  for success.  Mckenzie Hebert has agreed to follow-up with our clinic in 4 weeks. She was informed of the importance of frequent follow-up visits to maximize her success with intensive lifestyle modifications for her multiple health conditions.   Objective:   Blood pressure (!) 104/58, pulse (!) 58, temperature 98.5  F (36.9 C), height 5\' 7"  (1.702 m), weight 275 lb (124.7 kg), last menstrual period 03/18/2008, SpO2 94 %. Body mass index is 43.07 kg/m.  General: Cooperative, alert, well developed, in no acute distress. HEENT: Conjunctivae and lids unremarkable. Cardiovascular: Regular rhythm.  Lungs: Normal work of breathing. Neurologic: No focal deficits.   Lab Results  Component Value Date   CREATININE 0.67 09/03/2022   BUN 13 09/03/2022   NA 137 09/03/2022   K 4.6 09/03/2022   CL 98 09/03/2022   CO2 20 09/03/2022   Lab Results  Component Value Date   ALT 20 11/05/2022   AST 18 11/05/2022   ALKPHOS 71 09/03/2022   BILITOT 0.6 11/05/2022   Lab Results  Component Value Date   HGBA1C 6.8 (H) 11/05/2022   HGBA1C 7.1 (H) 09/08/2022   HGBA1C 6.1 (H) 05/05/2022   HGBA1C 6.3 (H) 01/29/2022   HGBA1C 6.1 (H) 07/29/2021   Lab Results  Component Value Date   INSULIN 26.0 (H) 09/08/2022   INSULIN 9.7 09/17/2021   INSULIN 20.7 02/14/2020   Lab Results  Component Value Date   TSH 2.06 11/05/2022   Lab Results  Component Value Date   CHOL 104 11/05/2022   HDL 47 (L) 11/05/2022   LDLCALC 37 11/05/2022   TRIG 113 11/05/2022   CHOLHDL 2.2 11/05/2022   Lab Results  Component Value Date   VD25OH 30.0 09/08/2022   VD25OH 42 01/29/2022   VD25OH 31 07/29/2021   Lab Results  Component Value Date   WBC 7.0 09/03/2022   HGB 13.7 09/03/2022   HCT 40.7 09/03/2022   MCV 83 09/03/2022   PLT 266 09/03/2022   Lab Results  Component Value Date   IRON 60 01/14/2018   TIBC 398 01/14/2018   FERRITIN 22 01/14/2018    Attestation Statements:   Reviewed by clinician on day of visit: allergies, medications, problem list, medical history, surgical history, family history, social history, and previous encounter notes.  I have reviewed the above documentation for accuracy and completeness, and I agree with the above. -  Darvin Dials d. Anye Brose, NP-C

## 2023-02-10 ENCOUNTER — Ambulatory Visit (INDEPENDENT_AMBULATORY_CARE_PROVIDER_SITE_OTHER): Payer: Medicare HMO | Admitting: Adult Health

## 2023-02-15 ENCOUNTER — Other Ambulatory Visit (INDEPENDENT_AMBULATORY_CARE_PROVIDER_SITE_OTHER): Payer: Self-pay | Admitting: Adult Health

## 2023-02-15 DIAGNOSIS — E1165 Type 2 diabetes mellitus with hyperglycemia: Secondary | ICD-10-CM

## 2023-02-22 ENCOUNTER — Ambulatory Visit (HOSPITAL_BASED_OUTPATIENT_CLINIC_OR_DEPARTMENT_OTHER): Payer: Medicare HMO | Admitting: Obstetrics & Gynecology

## 2023-02-22 ENCOUNTER — Other Ambulatory Visit (HOSPITAL_COMMUNITY)
Admission: RE | Admit: 2023-02-22 | Discharge: 2023-02-22 | Disposition: A | Discharge status: Home / Self Care | Payer: Medicare HMO | Source: Ambulatory Visit | Attending: Obstetrics & Gynecology | Admitting: Obstetrics & Gynecology

## 2023-02-22 ENCOUNTER — Encounter (HOSPITAL_BASED_OUTPATIENT_CLINIC_OR_DEPARTMENT_OTHER): Payer: Self-pay | Admitting: Obstetrics & Gynecology

## 2023-02-22 VITALS — BP 126/70 | HR 65 | Ht 67.0 in | Wt 274.8 lb

## 2023-02-22 DIAGNOSIS — E2839 Other primary ovarian failure: Secondary | ICD-10-CM

## 2023-02-22 DIAGNOSIS — Z124 Encounter for screening for malignant neoplasm of cervix: Secondary | ICD-10-CM

## 2023-02-22 DIAGNOSIS — Z780 Asymptomatic menopausal state: Secondary | ICD-10-CM | POA: Diagnosis not present

## 2023-02-22 DIAGNOSIS — D251 Intramural leiomyoma of uterus: Secondary | ICD-10-CM

## 2023-02-22 NOTE — Progress Notes (Signed)
67 y.o. G52P0010 Widowed White or Caucasian female here for new patient exam.  H/o fibroid uterus.  Had some PMP bleeding in 2019.  Had hysteroscopy with D&C then.  Pathology was negative.  No h/o HRT use.    Pap smear was negative with neg HR HPV. Guidelines reviewed.  She would like to have a pap today but is ok not having future pap smears.  BMD discussed.  Last was 2016.  Willing to update this.  Order placed.  She can do this with mammogram in September.  Denies vaginal bleeding.  Not SA.    Patient's last menstrual period was 03/18/2008 (lmp unknown).          Sexually active: No.  H/O STD:  no  Health Maintenance: PCP:  Sharlet Salina.  Last wellness appt was 10/2022.  Did blood work at that appt:  yes Vaccines are up to date:  has not done shingles vaccine Colonoscopy:  08/08/2021, tubular adenoma, follow up 5 years MMG:  06/01/2022 Negative BMD:  01/24/2015 Last pap smear:  02/13/2020 Negative.   H/o abnormal pap smear:  no   reports that she quit smoking about 51 years ago. Her smoking use included cigarettes. She has never used smokeless tobacco. She reports that she does not drink alcohol and does not use drugs.  Past Medical History:  Diagnosis Date   AC (acromioclavicular) joint bone spurs 2008   right foot   Anxiety    Asthma    Back pain    Bilateral swelling of feet    Constipation    Dyspnea    Dysrhythmia 2019   PVC   Endometrial polyp    Heartburn    History of colon polyps    Hypothyroidism    Joint pain    Migraines    Dr Clarisse Gouge   Osteoarthritis    PAC (premature atrial contraction)    Palpitations    cardiologist-  dr hochrein-- normal myoview 2017, holter 2015 mild ectopy   PONV (postoperative nausea and vomiting)    Pre-diabetes 2019   PVC (premature ventricular contraction)    SOB (shortness of breath)    Thyroid disease    Uterine fibroid    Vitamin D deficiency     Past Surgical History:  Procedure Laterality Date   APPENDECTOMY      CARDIOVASCULAR STRESS TEST  10-11-2015   dr hochrein   normal nuclear study w/ no ischemia/  normal LV function and wall motion, nuclear stress ef 64%   D & C HYSTERSCOPY W/ RESECTION POLYP  01-16-2010   dr gottsegen  @ [facility]   DILATATION & CURETTAGE/HYSTEROSCOPY WITH MYOSURE N/A 06/07/2018   Procedure: DILATATION & CURETTAGE/HYSTEROSCOPY WITH MYOSURE;  Surgeon: Dara Lords, MD;  Location: [facility];  Service: Gynecology;  Laterality: N/A;  request to follow first case in [place] [facility] block time  requests one hour   FOOT SURGERY  2002, 2008   bi-lat , bone spurs, achilles tendon work   HAMMER TOE SURGERY  11/2018   RESECTION TUMOR CLAVICLE RADICAL  1985   benign   TONSILLECTOMY     TOTAL KNEE ARTHROPLASTY Left 12/09/2020   Procedure: TOTAL KNEE ARTHROPLASTY;  Surgeon: Ollen Gross, MD;  Location: [facility];  Service: Orthopedics;  Laterality: Left;     Current Outpatient Medications  Medication Sig Dispense Refill   albuterol (VENTOLIN HFA) 108 (90 Base) MCG/ACT inhaler INHALE 2 PUFFS EVERY 6 HRS AS NEEDED 8.5 each prn  ALPRAZolam (XANAX) 1 MG tablet TAKE 1 TABLET BY MOUTH AS NEEDED FOR SLEEP 90 tablet 0   blood glucose meter kit and supplies KIT 1 each by Does not apply route daily as needed. Check glucose daily before breakfast and supper. 1 each 0   Blood Glucose Monitoring Suppl (TRUE METRIX AIR GLUCOSE METER) DEVI 1 Device by Does not apply route daily. E11.65 1 each O   clobetasol ointment (TEMOVATE) 0.05 % Apply 1 application topically 2 (two) times daily. 30 g 0   clotrimazole-betamethasone (LOTRISONE) cream Apply 1 application topically 2 (two) times daily. 30 g 2   docusate sodium (COLACE) 100 MG capsule Take 100 mg by mouth 2 (two) times daily.     ezetimibe (ZETIA) 10 MG tablet TAKE 1 TABLET BY MOUTH EVERY DAY 90 tablet 2   fluocinonide-emollient (LIDEX-E) 0.05 % cream Apply 1 application topically 2 (two) times daily. 30 g 2   glucose blood (TRUE  METRIX BLOOD GLUCOSE TEST) test strip Use as instructed 100 each 12   Lancets 28G MISC 1 each by Does not apply route daily. DX E11.65 100 each 0   levothyroxine (SYNTHROID) 150 MCG tablet TAKE 1 TABLET(150 MCG) BY MOUTH DAILY 90 tablet 1   losartan (COZAAR) 25 MG tablet Take 1 tablet (25 mg total) by mouth daily. 90 tablet 3   metFORMIN (GLUCOPHAGE) 500 MG tablet 1 tab with breakfast daily 30 tablet 0   metoprolol succinate (TOPROL-XL) 25 MG 24 hr tablet TAKE 1.5 TABLET BY MOUTH DAILY (Patient taking differently: Take 25 mg by mouth daily.) 135 tablet 3   pantoprazole (PROTONIX) 40 MG tablet TAKE 1 TABLET BY MOUTH EVERY DAY 90 tablet 2   rosuvastatin (CRESTOR) 10 MG tablet TAKE 1 TABLET BY MOUTH EVERY DAY 90 tablet 1   Vitamin D, Ergocalciferol, (DRISDOL) 1.25 MG (50000 UNIT) CAPS capsule Take 1 capsule (50,000 Units total) by mouth every 7 (seven) days. 12 capsule 0   No current facility-administered medications for this visit.    Family History  Problem Relation Age of Onset   Diabetes Mother    Hypertension Mother    Heart disease Mother 46       CABG   Stroke Mother    Hyperlipidemia Mother    Cancer Father        lung cancer   Depression Father    Alcohol abuse Father    Diabetes Sister    Hypertension Sister    Thyroid disease Sister        hyperthyroidism   Breast cancer Paternal Aunt    Hypertension Sister    Autoimmune disease Sister    Leukemia Paternal Grandmother    Mental retardation Other     Review of Systems  Constitutional: Negative.   Genitourinary: Negative.     Exam:   BP 126/70 (BP Location: Left Arm, Patient Position: Sitting, Cuff Size: Large)   Pulse 65   Ht 5\' 7"  (1.702 m) Comment: Reported  Wt 274 lb 12.8 oz (124.6 kg)   LMP 03/18/2008 (LMP Unknown)   BMI 43.04 kg/m   Height: 5\' 7"  (170.2 cm) (Reported)  General appearance: alert, cooperative and appears stated age Breasts: normal appearance, no masses or tenderness Abdomen: soft,  non-tender; bowel sounds normal; no masses,  no organomegaly Lymph nodes: Cervical, supraclavicular, and axillary nodes normal.  No abnormal inguinal nodes palpated Neurologic: Grossly normal  Pelvic: External genitalia:  no lesions  Urethra:  normal appearing urethra with no masses, tenderness or lesions              Bartholins and Skenes: normal                 Vagina: atrophic vagina with atrophic changes and no discharge, no lesions              Cervix: no lesions              Pap taken: Yes.   Bimanual Exam:  Uterus:  normal size, contour, position, consistency, mobility, non-tender              Adnexa: normal adnexa and no mass, fullness, tenderness               Rectovaginal: Confirms               Anus:  normal sphincter tone, no lesions  Chaperone, Ina Homes, CMA, was present for exam.  Assessment/Plan: 1. Postmenopausal - Pap smear obtained today - Mammogram 05/2022.  Doing yearly. - Colonoscopy 2022.  Follow up 5 years. - Bone mineral density ordered - lab work done with Dr. Lenord Fellers - vaccines reviewed/updated  2. Encounter for Papanicolaou smear for cervical cancer screening - Cytology - PAP( ) - PR OBTAINING SCREEN PAP SMEAR  3. Hypoestrogenism - DG BONE DENSITY (DXA); Future  4. Intramural leiomyoma of uterus - normal exam

## 2023-02-24 LAB — CYTOLOGY - PAP: Diagnosis: NEGATIVE

## 2023-02-27 ENCOUNTER — Other Ambulatory Visit: Payer: Self-pay | Admitting: Internal Medicine

## 2023-02-27 ENCOUNTER — Other Ambulatory Visit (INDEPENDENT_AMBULATORY_CARE_PROVIDER_SITE_OTHER): Payer: Self-pay | Admitting: Adult Health

## 2023-03-04 ENCOUNTER — Other Ambulatory Visit: Payer: Medicare HMO

## 2023-03-04 ENCOUNTER — Ambulatory Visit (INDEPENDENT_AMBULATORY_CARE_PROVIDER_SITE_OTHER): Payer: Medicare HMO | Admitting: Podiatry

## 2023-03-04 ENCOUNTER — Other Ambulatory Visit (INDEPENDENT_AMBULATORY_CARE_PROVIDER_SITE_OTHER): Payer: Self-pay | Admitting: Adult Health

## 2023-03-04 ENCOUNTER — Telehealth (INDEPENDENT_AMBULATORY_CARE_PROVIDER_SITE_OTHER): Payer: Self-pay | Admitting: Adult Health

## 2023-03-04 ENCOUNTER — Other Ambulatory Visit: Payer: Self-pay | Admitting: Internal Medicine

## 2023-03-04 DIAGNOSIS — M2041 Other hammer toe(s) (acquired), right foot: Secondary | ICD-10-CM

## 2023-03-04 DIAGNOSIS — M2042 Other hammer toe(s) (acquired), left foot: Secondary | ICD-10-CM

## 2023-03-04 DIAGNOSIS — M779 Enthesopathy, unspecified: Secondary | ICD-10-CM

## 2023-03-04 DIAGNOSIS — E1165 Type 2 diabetes mellitus with hyperglycemia: Secondary | ICD-10-CM

## 2023-03-04 NOTE — Telephone Encounter (Signed)
New message    1. Which medications need to be refilled? (please list name of each medication and dose if known) metFORMIN (GLUCOPHAGE) 500 MG tablet   2. Which pharmacy/location (including street and city if local pharmacy) is medication to be sent to?[id number] - [place], [place] - [address]   3. Do they need a 30 day or 90 day supply? 30 day supply

## 2023-03-04 NOTE — Progress Notes (Signed)
Patient presented today because her orthotics were squeaking due to the heel post rubbing on the side of the shoe.  Left squeaks worse than the right one.  Sending back to [facility] to be adjusted.

## 2023-03-07 ENCOUNTER — Other Ambulatory Visit (INDEPENDENT_AMBULATORY_CARE_PROVIDER_SITE_OTHER): Payer: Self-pay | Admitting: Adult Health

## 2023-03-07 DIAGNOSIS — E1165 Type 2 diabetes mellitus with hyperglycemia: Secondary | ICD-10-CM

## 2023-03-07 MED ORDER — METFORMIN HCL 500 MG PO TABS
ORAL_TABLET | ORAL | 0 refills | Status: DC
Start: 2023-03-07 — End: 2023-03-18

## 2023-03-18 ENCOUNTER — Ambulatory Visit (INDEPENDENT_AMBULATORY_CARE_PROVIDER_SITE_OTHER): Payer: Medicare HMO | Admitting: Adult Health

## 2023-03-18 ENCOUNTER — Encounter (INDEPENDENT_AMBULATORY_CARE_PROVIDER_SITE_OTHER): Payer: Self-pay | Admitting: Adult Health

## 2023-03-18 VITALS — BP 106/72 | HR 66 | Temp 97.6°F | Ht 67.0 in | Wt 270.0 lb

## 2023-03-18 DIAGNOSIS — E1165 Type 2 diabetes mellitus with hyperglycemia: Secondary | ICD-10-CM | POA: Diagnosis not present

## 2023-03-18 DIAGNOSIS — Z6841 Body Mass Index (BMI) 40.0 and over, adult: Secondary | ICD-10-CM

## 2023-03-18 DIAGNOSIS — E669 Obesity, unspecified: Secondary | ICD-10-CM | POA: Diagnosis not present

## 2023-03-18 DIAGNOSIS — Z7984 Long term (current) use of oral hypoglycemic drugs: Secondary | ICD-10-CM

## 2023-03-18 DIAGNOSIS — F419 Anxiety disorder, unspecified: Secondary | ICD-10-CM

## 2023-03-18 MED ORDER — METFORMIN HCL 500 MG PO TABS
ORAL_TABLET | ORAL | 0 refills | Status: DC
Start: 2023-03-18 — End: 2023-04-13

## 2023-03-18 NOTE — Progress Notes (Signed)
WEIGHT SUMMARY AND BIOMETRICS  Vitals Temp: 97.6 F (36.4 C) BP: 106/72 Pulse Rate: 66 SpO2: 95 %   Anthropometric Measurements Height: 5\' 7"  (1.702 m) Weight: 270 lb (122.5 kg) BMI (Calculated): 42.28 Weight at Last Visit: 275lb Weight Lost Since Last Visit: 5lb Weight Gained Since Last Visit: 0 Starting Weight: 278lb Total Weight Loss (lbs): 8 lb (3.629 kg)   Body Composition  Body Fat %: 50.7 % Fat Mass (lbs): 137.2 lbs Muscle Mass (lbs): 126.8 lbs Total Body Water (lbs): 97.8 lbs Visceral Fat Rating : 18   Other Clinical Data Fasting: no Labs: no Today's Visit #: 14 Starting Date: 02/14/20    Chief Complaint:   OBESITY Mckenzie Hebert is here to discuss her progress with her obesity treatment plan. She is on the the Category 4 Plan and states she is following her eating plan approximately 70 % of the time. She states she is exercising NEAT/daily.   Interim History:  Mckenzie Hebert has been off Metformin 500mg  every day for approximately 3 weeks- pharmacy did not provide refill that WAS in system?  Reviewed [employer] results with pt Muscle Mass: + 0.6 lb Adipose Mass: - 5 lbs  Of note- She will have fasting lab panel with her PCP/Dr. Lenord Fellers  Subjective:   1. Type 2 diabetes mellitus with hyperglycemia, without long-term current use of insulin (HCC) Lab Results  Component Value Date   HGBA1C 6.8 (H) 11/05/2022   HGBA1C 7.1 (H) 09/08/2022   HGBA1C 6.1 (H) 05/05/2022    Mckenzie Hebert has been off Metformin 500mg  every day for approximately 3 weeks- pharmacy did not provide refill that WAS in system? She has 3 out 4 Ozempic 0.25mg  injections remaining. She prefers to remain off GLP-1 therapy as she is in Medicare "[facility]" and unable to afford to continue therapy until Jan 2025.  2. Anxiety disorder, unspecified type She is on PRN Xanax 1mg  PDMP reviewed- no aberrancies noted.  She recently adopted anther dog, "Lulu". She already had "Bo" These  pets provide companionship and love!  Assessment/Plan:   1. Type 2 diabetes mellitus with hyperglycemia, without long-term current use of insulin (HCC) Refill - metFORMIN (GLUCOPHAGE) 500 MG tablet; 1 tab with breakfast daily  Dispense: 90 tablet; Refill: 0 Consider restarting Ozempic therapy after jan 2025 and out Medicare "[facility]"  2. Anxiety disorder, unspecified type Increase time with pets Increase walking with pets, as weather permits  3. Obesity, current BMI 42.28  Mckenzie Hebert is currently in the action stage of change. As such, her goal is to continue with weight loss efforts. She has agreed to the Category 4 Plan.   Exercise goals: Older adults should follow the adult guidelines. When older adults cannot meet the adult guidelines, they should be as physically active as their abilities and conditions will allow.  Older adults should do exercises that maintain or improve balance if they are at risk of falling.  Older adults with chronic conditions should understand whether and how their conditions affect their ability to do regular physical activity safely.  Behavioral modification strategies: increasing lean protein intake, decreasing simple carbohydrates, increasing vegetables, increasing water intake, no skipping meals, meal planning and cooking strategies, and planning for success.  Mckenzie Hebert has agreed to follow-up with our clinic in 4 weeks. She was informed of the importance of frequent follow-up visits to maximize her success with intensive lifestyle modifications for her multiple health conditions.   Objective:   Blood pressure 106/72, pulse 66, temperature 97.6  F (36.4 C), height 5\' 7"  (1.702 m), weight 270 lb (122.5 kg), last menstrual period 03/18/2008, SpO2 95%. Body mass index is 42.29 kg/m.  General: Cooperative, alert, well developed, in no acute distress. HEENT: Conjunctivae and lids unremarkable. Cardiovascular: Regular rhythm.  Lungs: Normal work of  breathing. Neurologic: No focal deficits.   Lab Results  Component Value Date   CREATININE 0.67 09/03/2022   BUN 13 09/03/2022   NA 137 09/03/2022   K 4.6 09/03/2022   CL 98 09/03/2022   CO2 20 09/03/2022   Lab Results  Component Value Date   ALT 20 11/05/2022   AST 18 11/05/2022   ALKPHOS 71 09/03/2022   BILITOT 0.6 11/05/2022   Lab Results  Component Value Date   HGBA1C 6.8 (H) 11/05/2022   HGBA1C 7.1 (H) 09/08/2022   HGBA1C 6.1 (H) 05/05/2022   HGBA1C 6.3 (H) 01/29/2022   HGBA1C 6.1 (H) 07/29/2021   Lab Results  Component Value Date   INSULIN 26.0 (H) 09/08/2022   INSULIN 9.7 09/17/2021   INSULIN 20.7 02/14/2020   Lab Results  Component Value Date   TSH 2.06 11/05/2022   Lab Results  Component Value Date   CHOL 104 11/05/2022   HDL 47 (L) 11/05/2022   LDLCALC 37 11/05/2022   TRIG 113 11/05/2022   CHOLHDL 2.2 11/05/2022   Lab Results  Component Value Date   VD25OH 30.0 09/08/2022   VD25OH 42 01/29/2022   VD25OH 31 07/29/2021   Lab Results  Component Value Date   WBC 7.0 09/03/2022   HGB 13.7 09/03/2022   HCT 40.7 09/03/2022   MCV 83 09/03/2022   PLT 266 09/03/2022   Lab Results  Component Value Date   IRON 60 01/14/2018   TIBC 398 01/14/2018   FERRITIN 22 01/14/2018    Attestation Statements:   Reviewed by clinician on day of visit: allergies, medications, problem list, medical history, surgical history, family history, social history, and previous encounter notes.  I have reviewed the above documentation for accuracy and completeness, and I agree with the above. -  Temima Kutsch d. Srihan Brutus, NP-C

## 2023-04-06 NOTE — Progress Notes (Addendum)
Annual Wellness Visit    Patient Care Team: Margaree Mackintosh, MD as PCP - General (Internal Medicine)  Visit Date: 04/13/23   Chief Complaint  Patient presents with   Medicare Wellness   Annual Exam    Subjective:   Patient: Mckenzie Hebert, Female    DOB: October 23, 1955, 67 y.o.   MRN: [id number]  Mckenzie Hebert is a 67 y.o. Female who presents today for her Annual Wellness Visit.   She has been experiencing some vaginal itching recently.  History of insomnia treated with alprazolam 1 mg as needed.  History of hypothyroidism treated with levothyroxine 150 mcg daily. TSH at 2.72.  History of hypertension treated with losartan 25 mg daily, metoprolol succinate 25 mg daily. Blood pressure normal today at 110/60.  History of GERD treated with pantoprazole 40 mg daily.  History of hyperlipidemia treated with rosuvastatin 10 mg daily, ezetimibe 10 mg daily. Lipid panel normal.   History of impaired glucose tolerance treated with metformin 500 mg with breakfast. HGBA1c at 6.8% on 04/08/23, no change from 11/05/22. Insurance would not cover [employer]. She wishes to maintain her diet on her own without going to [employer].  History of Vitamin D deficiency treated with Drisdol 50,000 units weekly.  History of left knee arthroplasty by Dr. Despina Hick April 2022.  History of hysteroscopic resection of endometrial polyp and leiomyoma October 2019 by Dr. Audie Box.  History of asthma, eczema, hyperplastic colon polyps, insomnia, anxiety, urticaria, history of migraine headaches, hypothyroidism, vitamin D deficiency, GE reflux, obesity, impaired glucose tolerance.  History of Chiari I malformation.  History of functional constipation.  She had H. pylori in 2009.  Angioedema of the lip 2010.  Herpes zoster 2011.  Had laparoscopic appendectomy 1995.  History of asthma treated with albuterol inhaler.  History of palpitations and has had cardiac evaluation spring 2022.  Had near  syncope episode March 2022.  Saw cardiologist and had normal Myoview study.  Monitor that she wore for 14 days showed frequent PVCs, frequent PACs and 31 episodes of SVT with the longest lasting 14 seconds.  Echocardiogram on 11/29/20 showed normal biventricular function, no significant valvular disease.   Cardiologist is not sure what caused her syncope but she has been started as of last year follow-up metoprolol 25 mg twice daily for dysrhythmia.  She is on Zetia 10 mg daily.    Glucose elevated at 130. Kidney, liver functions normal. Electrolytes normal. Blood proteins normal. MCH low at 26.9.   Mammogram last completed 06/02/22. No mammographic evidence of malignancy. Recommended repeat in 2024.  Colonoscopy last completed 08/08/2021. Results showed tubular adenoma. Recommended repeat in 2027.  Social history: She has rescued a new dog and she is very pleased with that. She is a widow.  Husband died of heart problems.  No children.  She smoked until the age of 37 and then quit.  No alcohol consumption.  Works as a Radiation protection practitioner for [employer].  Resides alone.  Lives in the [place] area.  Family history: Parents are deceased.  Mother died at age 31 with history of hypertension, diabetes, coronary disease, MI s/p CABG.  Father died in his 45s with history of lung cancer.  2 sisters.  1 sister with history of diabetes and hypertension.  2 brothers.   Past Medical History:  Diagnosis Date   AC (acromioclavicular) joint bone spurs 2008   right foot   Anxiety    Asthma    Back pain  Bilateral swelling of feet    Constipation    Dyspnea    Dysrhythmia 2019   PVC   Endometrial polyp    Heartburn    History of colon polyps    Hypothyroidism    Joint pain    Migraines    Dr Clarisse Gouge   Osteoarthritis    PAC (premature atrial contraction)    Palpitations    cardiologist-  dr hochrein-- normal myoview 2017, holter 2015 mild ectopy   PONV (postoperative nausea and  vomiting)    Pre-diabetes 2019   PVC (premature ventricular contraction)    SOB (shortness of breath)    Thyroid disease    Uterine fibroid    Vitamin D deficiency      Family History  Problem Relation Age of Onset   Diabetes Mother    Hypertension Mother    Heart disease Mother 53       CABG   Stroke Mother    Hyperlipidemia Mother    Cancer Father        lung cancer   Depression Father    Alcohol abuse Father    Diabetes Sister    Hypertension Sister    Thyroid disease Sister        hyperthyroidism   Breast cancer Paternal Aunt    Hypertension Sister    Autoimmune disease Sister    Leukemia Paternal Grandmother    Mental retardation Other      Social History   Social History Narrative   Lives alone.       Review of Systems  Constitutional:  Negative for chills, fever, malaise/fatigue and weight loss.  HENT:  Negative for hearing loss, sinus pain and sore throat.   Respiratory:  Negative for cough, hemoptysis and shortness of breath.   Cardiovascular:  Negative for chest pain, palpitations, leg swelling and PND.  Gastrointestinal:  Negative for abdominal pain, constipation, diarrhea, heartburn, nausea and vomiting.  Genitourinary:  Negative for dysuria, frequency and urgency.       (+) Vaginal itching  Musculoskeletal:  Negative for back pain, myalgias and neck pain.  Skin:  Negative for itching and rash.  Neurological:  Negative for dizziness, tingling, seizures and headaches.  Endo/Heme/Allergies:  Negative for polydipsia.  Psychiatric/Behavioral:  Negative for depression. The patient is not nervous/anxious.       Objective:   Vitals: BP 110/60   Pulse (!) 57   Ht 5\' 7"  (1.702 m)   Wt 279 lb (126.6 kg)   LMP 03/18/2008 (LMP Unknown)   SpO2 97%   BMI 43.70 kg/m   Physical Exam Vitals and nursing note reviewed.  Constitutional:      General: She is not in acute distress.    Appearance: Normal appearance. She is not ill-appearing or  toxic-appearing.  HENT:     Head: Normocephalic and atraumatic.     Right Ear: Hearing, tympanic membrane, ear canal and external ear normal.     Left Ear: Hearing, tympanic membrane, ear canal and external ear normal.     Mouth/Throat:     Pharynx: Oropharynx is clear.  Eyes:     Extraocular Movements: Extraocular movements intact.     Pupils: Pupils are equal, round, and reactive to light.  Neck:     Thyroid: No thyroid mass, thyromegaly or thyroid tenderness.     Vascular: No carotid bruit.  Cardiovascular:     Rate and Rhythm: Normal rate and regular rhythm. No extrasystoles are present.    Heart sounds: Normal  heart sounds. No murmur heard.    No friction rub. No gallop.  Pulmonary:     Effort: Pulmonary effort is normal.     Breath sounds: Normal breath sounds. No decreased breath sounds, wheezing, rhonchi or rales.  Chest:     Chest wall: No mass.  Abdominal:     Palpations: Abdomen is soft. There is no hepatomegaly, splenomegaly or mass.     Tenderness: There is no abdominal tenderness.     Hernia: No hernia is present.  Musculoskeletal:     Cervical back: Normal range of motion.     Right lower leg: No edema.     Left lower leg: No edema.  Lymphadenopathy:     Cervical: No cervical adenopathy.     Upper Body:     Right upper body: No supraclavicular adenopathy.     Left upper body: No supraclavicular adenopathy.  Skin:    General: Skin is warm and dry.  Neurological:     General: No focal deficit present.     Mental Status: She is alert and oriented to person, place, and time. Mental status is at baseline.     Sensory: Sensation is intact.     Motor: Motor function is intact. No weakness.     Deep Tendon Reflexes: Reflexes are normal and symmetric.  Psychiatric:        Attention and Perception: Attention normal.        Mood and Affect: Mood normal.        Speech: Speech normal.        Behavior: Behavior normal.        Thought Content: Thought content normal.         Cognition and Memory: Cognition normal.        Judgment: Judgment normal.      Most recent functional status assessment:    04/13/2023    3:06 PM  In your present state of health, do you have any difficulty performing the following activities:  Hearing? 0  Vision? 0  Difficulty concentrating or making decisions? 0  Walking or climbing stairs? 0  Dressing or bathing? 0  Doing errands, shopping? 0  Preparing Food and eating ? N  Using the Toilet? N  In the past six months, have you accidently leaked urine? N  Do you have problems with loss of bowel control? N  Managing your Medications? N  Managing your Finances? N  Housekeeping or managing your Housekeeping? N   Most recent fall risk assessment:    04/13/2023    3:12 PM  Fall Risk   Falls in the past year? 0  Number falls in past yr: 0  Injury with Fall? 0  Risk for fall due to : No Fall Risks  Follow up Falls evaluation completed;Falls prevention discussed    Most recent depression screenings:    04/13/2023    3:12 PM 02/22/2023   10:24 AM  PHQ 2/9 Scores  PHQ - 2 Score 0 0   Most recent cognitive screening:    02/03/2022   10:12 AM  6CIT Screen  What Year? 0 points  What month? 0 points  What time? 0 points  Count back from 20 0 points  Months in reverse 0 points  Repeat phrase 0 points  Total Score 0 points     Results:   Studies obtained and personally reviewed by me:  Mammogram last completed 06/02/22. No mammographic evidence of malignancy. Recommended repeat in 2024.  Colonoscopy last completed  08/08/2021. Results showed tubular adenoma. Recommended repeat in 2027.  Labs:       Component Value Date/Time   NA 138 04/08/2023 0923   NA 137 09/03/2022 1158   K 4.4 04/08/2023 0923   CL 103 04/08/2023 0923   CO2 27 04/08/2023 0923   GLUCOSE 130 (H) 04/08/2023 0923   BUN 11 04/08/2023 0923   BUN 13 09/03/2022 1158   CREATININE 0.65 04/08/2023 0923   CALCIUM 9.8 04/08/2023 0923   PROT 6.9  04/08/2023 0923   PROT 7.0 09/03/2022 1158   ALBUMIN 4.3 09/03/2022 1158   AST 19 04/08/2023 0923   ALT 20 04/08/2023 0923   ALKPHOS 71 09/03/2022 1158   BILITOT 0.6 04/08/2023 0923   BILITOT 0.4 09/03/2022 1158   GFRNONAA 94 01/28/2021 0919   GFRAA 109 01/28/2021 0919     Lab Results  Component Value Date   WBC 5.9 04/08/2023   HGB 12.6 04/08/2023   HCT 38.5 04/08/2023   MCV 82.3 04/08/2023   PLT 243 04/08/2023    Lab Results  Component Value Date   CHOL 109 04/08/2023   HDL 50 04/08/2023   LDLCALC 40 04/08/2023   TRIG 104 04/08/2023   CHOLHDL 2.2 04/08/2023    Lab Results  Component Value Date   HGBA1C 6.8 (H) 04/08/2023     Lab Results  Component Value Date   TSH 2.72 04/08/2023    Assessment & Plan:   Vaginal itching: suspect Candida vaginitis. Prescribed terconazole 0.4% at bedtime.  Insomnia: treated with alprazolam 1 mg as needed.  Hypothyroidism: treated with levothyroxine 150 mcg daily. TSH at 2.72.  Hypertension: treated with losartan 25 mg daily, metoprolol succinate 25 mg daily. Blood pressure normal today at 110/60.  GERD: treated with pantoprazole 40 mg daily.  Hyperlipidemia: treated with rosuvastatin 10 mg daily, ezetimibe 10 mg daily. Lipid panel normal.   Impaired glucose tolerance: treated with metformin 500 mg with breakfast. Refilled. HGBA1c at 6.8% on 04/08/23, no change from 11/05/22.  Vitamin D deficiency: treated with Drisdol 50,000 units weekly.  Asthma: treated with albuterol inhaler as needed.  Urinalysis negative for urobilinogen.   Mammogram last completed 06/02/22. No mammographic evidence of malignancy. Recommended repeat in 2024.  Colonoscopy last completed 08/08/2021. Results showed tubular adenoma. Recommended repeat in 2027.  Vaccine counseling: UTD on tetanus vaccine. Discussed other vaccines.  Return in 1 year for health maintenance exam or as needed.     Annual wellness visit done today including the all of the  following: Reviewed patient's Family Medical History Reviewed and updated list of patient's medical providers Assessment of cognitive impairment was done Assessed patient's functional ability Established a written schedule for health screening services Health Risk Assessent Completed and Reviewed  Discussed health benefits of physical activity, and encouraged her to engage in regular exercise appropriate for her age and condition.        I,Alexander Ruley,acting as a Neurosurgeon for Margaree Mackintosh, MD.,have documented all relevant documentation on the behalf of Margaree Mackintosh, MD,as directed by  Margaree Mackintosh, MD while in the presence of Margaree Mackintosh, MD.   I, Margaree Mackintosh, MD, have reviewed all documentation for this visit. The documentation on 04/26/23 for the exam, diagnosis, procedures, and orders are all accurate and complete.

## 2023-04-08 ENCOUNTER — Other Ambulatory Visit: Payer: Medicare HMO

## 2023-04-08 DIAGNOSIS — E1165 Type 2 diabetes mellitus with hyperglycemia: Secondary | ICD-10-CM | POA: Diagnosis not present

## 2023-04-08 DIAGNOSIS — E669 Obesity, unspecified: Secondary | ICD-10-CM | POA: Diagnosis not present

## 2023-04-08 DIAGNOSIS — Z0000 Encounter for general adult medical examination without abnormal findings: Secondary | ICD-10-CM

## 2023-04-08 DIAGNOSIS — Z13220 Encounter for screening for lipoid disorders: Secondary | ICD-10-CM | POA: Diagnosis not present

## 2023-04-08 DIAGNOSIS — E039 Hypothyroidism, unspecified: Secondary | ICD-10-CM | POA: Diagnosis not present

## 2023-04-08 LAB — CBC
HCT: 38.5 % (ref 35.0–45.0)
Hemoglobin: 12.6 g/dL (ref 11.7–15.5)
MCH: 26.9 pg — ABNORMAL LOW (ref 27.0–33.0)
MCHC: 32.7 g/dL (ref 32.0–36.0)
MCV: 82.3 fL (ref 80.0–100.0)
MPV: 9.8 fL (ref 7.5–12.5)
Platelets: 243 10*3/uL (ref 140–400)
RBC: 4.68 10*6/uL (ref 3.80–5.10)
RDW: 13.5 % (ref 11.0–15.0)
WBC: 5.9 10*3/uL (ref 3.8–10.8)

## 2023-04-13 ENCOUNTER — Encounter: Payer: Self-pay | Admitting: Internal Medicine

## 2023-04-13 ENCOUNTER — Ambulatory Visit: Payer: Medicare HMO | Admitting: Internal Medicine

## 2023-04-13 VITALS — BP 110/60 | HR 57 | Ht 67.0 in | Wt 279.0 lb

## 2023-04-13 DIAGNOSIS — Z96651 Presence of right artificial knee joint: Secondary | ICD-10-CM

## 2023-04-13 DIAGNOSIS — E559 Vitamin D deficiency, unspecified: Secondary | ICD-10-CM | POA: Diagnosis not present

## 2023-04-13 DIAGNOSIS — E785 Hyperlipidemia, unspecified: Secondary | ICD-10-CM | POA: Diagnosis not present

## 2023-04-13 DIAGNOSIS — E1169 Type 2 diabetes mellitus with other specified complication: Secondary | ICD-10-CM | POA: Diagnosis not present

## 2023-04-13 DIAGNOSIS — E039 Hypothyroidism, unspecified: Secondary | ICD-10-CM | POA: Diagnosis not present

## 2023-04-13 DIAGNOSIS — Z96652 Presence of left artificial knee joint: Secondary | ICD-10-CM

## 2023-04-13 DIAGNOSIS — I10 Essential (primary) hypertension: Secondary | ICD-10-CM | POA: Diagnosis not present

## 2023-04-13 DIAGNOSIS — Z0000 Encounter for general adult medical examination without abnormal findings: Secondary | ICD-10-CM

## 2023-04-13 DIAGNOSIS — J4531 Mild persistent asthma with (acute) exacerbation: Secondary | ICD-10-CM

## 2023-04-13 DIAGNOSIS — I152 Hypertension secondary to endocrine disorders: Secondary | ICD-10-CM

## 2023-04-13 DIAGNOSIS — F5105 Insomnia due to other mental disorder: Secondary | ICD-10-CM

## 2023-04-13 DIAGNOSIS — F3289 Other specified depressive episodes: Secondary | ICD-10-CM

## 2023-04-13 DIAGNOSIS — Z8719 Personal history of other diseases of the digestive system: Secondary | ICD-10-CM

## 2023-04-13 DIAGNOSIS — F409 Phobic anxiety disorder, unspecified: Secondary | ICD-10-CM | POA: Diagnosis not present

## 2023-04-13 DIAGNOSIS — E1165 Type 2 diabetes mellitus with hyperglycemia: Secondary | ICD-10-CM

## 2023-04-13 DIAGNOSIS — F419 Anxiety disorder, unspecified: Secondary | ICD-10-CM

## 2023-04-13 DIAGNOSIS — Z6841 Body Mass Index (BMI) 40.0 and over, adult: Secondary | ICD-10-CM

## 2023-04-13 LAB — POCT URINALYSIS DIP (CLINITEK)
Bilirubin, UA: NEGATIVE
Blood, UA: NEGATIVE
Glucose, UA: NEGATIVE mg/dL
Ketones, POC UA: NEGATIVE mg/dL
Leukocytes, UA: NEGATIVE
Nitrite, UA: NEGATIVE
POC PROTEIN,UA: NEGATIVE
Spec Grav, UA: 1.01 (ref 1.010–1.025)
Urobilinogen, UA: NEGATIVE E.U./dL — AB
pH, UA: 6.5 (ref 5.0–8.0)

## 2023-04-13 MED ORDER — TERCONAZOLE 0.4 % VA CREA: 1.0000 | TOPICAL_CREAM | Freq: Every day | VAGINAL | 1 refills | Status: DC

## 2023-04-13 MED ORDER — METFORMIN HCL 500 MG PO TABS: 500.0000 mg | ORAL_TABLET | Freq: Two times a day (BID) | ORAL | 3 refills | Status: DC

## 2023-04-14 NOTE — Telephone Encounter (Signed)
Lmom for pt to call back to pick up her orthotics   No balance this was an adjustment

## 2023-04-19 ENCOUNTER — Ambulatory Visit (INDEPENDENT_AMBULATORY_CARE_PROVIDER_SITE_OTHER): Payer: Medicare HMO | Admitting: Adult Health

## 2023-04-19 ENCOUNTER — Encounter (INDEPENDENT_AMBULATORY_CARE_PROVIDER_SITE_OTHER): Payer: Self-pay | Admitting: Adult Health

## 2023-04-19 VITALS — BP 115/72 | HR 66 | Temp 97.9°F | Ht 67.0 in | Wt 272.0 lb

## 2023-04-19 DIAGNOSIS — Z7984 Long term (current) use of oral hypoglycemic drugs: Secondary | ICD-10-CM

## 2023-04-19 DIAGNOSIS — E1165 Type 2 diabetes mellitus with hyperglycemia: Secondary | ICD-10-CM

## 2023-04-19 DIAGNOSIS — Z6841 Body Mass Index (BMI) 40.0 and over, adult: Secondary | ICD-10-CM

## 2023-04-19 DIAGNOSIS — Z6839 Body mass index (BMI) 39.0-39.9, adult: Secondary | ICD-10-CM

## 2023-04-19 NOTE — Progress Notes (Signed)
WEIGHT SUMMARY AND BIOMETRICS  Vitals Temp: 97.9 F (36.6 C) BP: 115/72 Pulse Rate: 66 SpO2: 97 %   Anthropometric Measurements Height: 5\' 7"  (1.702 m) Weight: 272 lb (123.4 kg) BMI (Calculated): 42.59 Weight at Last Visit: 270lb Weight Lost Since Last Visit: 0 Weight Gained Since Last Visit: 2lb Starting Weight: 278lb Total Weight Loss (lbs): 6 lb (2.722 kg)   Body Composition  Body Fat %: 51.2 % Fat Mass (lbs): 139.2 lbs Muscle Mass (lbs): 126.2 lbs Total Body Water (lbs): 98.6 lbs Visceral Fat Rating : 18   Other Clinical Data Fasting: no Labs: no Today's Visit #: 15 Starting Date: 02/14/20    Chief Complaint:   OBESITY Mckenzie Hebert is here to discuss her progress with her obesity treatment plan. She is on the the Category 4 Plan and states she is following her eating plan approximately 70 % of the time. She states she is exercising NEAT activities and washing two large dogs every Sunday.   Interim History:  04/13/2023 Annual Medicare Wellness with PCP Labs completed and she assumed responsibility of Metformin management  She is still adjusting to her new retirement life. 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 Type 2 diabetes mellitus with hyperglycemia, without long-term current use of insulin (HCC) Lab Results  Component Value Date   HGBA1C 6.8 (H) 04/08/2023   HGBA1C 6.8 (H) 11/05/2022   HGBA1C 7.1 (H) 09/08/2022     Latest Reference Range & Units 04/08/23 09:23  eAG (mmol/L) mmol/L 8.2  Glucose 65 - 99 mg/dL 664 (H)  Hemoglobin Q0H <5.7 % of total Hgb 6.8 (H)  (H): Data is abnormally high  PCP/Dr. Lenord Fellers took over management of Metformin 500mg  and increased from every day to BID. She has remained on every day until her Rx from [facility] runs out. She denies GI upset with current therapy.  Assessment/Plan:   1. Type 2 diabetes mellitus  with hyperglycemia, without long-term current use of insulin (HCC) Increase Metformin 500mg  to BID per PCP Limit simple CHO and sugar intake  2. Obesity, current BMI 42.59  Duru is currently in the action stage of change. As such, her goal is to continue with weight loss efforts. She has agreed to the Category 4 Plan.   Exercise goals: Older adults should follow the adult guidelines. When older adults cannot meet the adult guidelines, they should be as physically active as their abilities and conditions will allow.  Older adults should do exercises that maintain or improve balance if they are at risk of falling.  Older adults should determine their level of effort for physical activity relative to their level of fitness.  Older adults with chronic conditions should understand whether and how their conditions affect their ability to do regular physical activity safely.  Behavioral modification strategies: increasing lean protein intake, decreasing simple carbohydrates, increasing vegetables, increasing water intake, meal planning and cooking strategies, keeping healthy foods in the home, and planning for success.  Nelah has agreed to follow-up with our clinic in 4 weeks. She was informed of the importance of frequent follow-up visits to maximize her success with intensive lifestyle modifications for her multiple health conditions.   Objective:   Blood pressure 115/72, pulse 66, temperature 97.9 F (36.6 C), height 5\' 7"  (1.702 m), weight 272 lb (123.4 kg), last menstrual period 03/18/2008, SpO2 97%. Body mass index is 42.6 kg/m.  General: Cooperative, alert, well developed, in no acute distress. HEENT: Conjunctivae and lids unremarkable. Cardiovascular: Regular rhythm.  Lungs: Normal work of breathing. Neurologic: No focal deficits.   Lab Results  Component Value Date   CREATININE 0.65 04/08/2023   BUN 11 04/08/2023   NA 138 04/08/2023   K 4.4 04/08/2023   CL 103 04/08/2023   CO2 27  04/08/2023   Lab Results  Component Value Date   ALT 20 04/08/2023   AST 19 04/08/2023   ALKPHOS 71 09/03/2022   BILITOT 0.6 04/08/2023   Lab Results  Component Value Date   HGBA1C 6.8 (H) 04/08/2023   HGBA1C 6.8 (H) 11/05/2022   HGBA1C 7.1 (H) 09/08/2022   HGBA1C 6.1 (H) 05/05/2022   HGBA1C 6.3 (H) 01/29/2022   Lab Results  Component Value Date   INSULIN 26.0 (H) 09/08/2022   INSULIN 9.7 09/17/2021   INSULIN 20.7 02/14/2020   Lab Results  Component Value Date   TSH 2.72 04/08/2023   Lab Results  Component Value Date   CHOL 109 04/08/2023   HDL 50 04/08/2023   LDLCALC 40 04/08/2023   TRIG 104 04/08/2023   CHOLHDL 2.2 04/08/2023   Lab Results  Component Value Date   VD25OH 30.0 09/08/2022   VD25OH 42 01/29/2022   VD25OH 31 07/29/2021   Lab Results  Component Value Date   WBC 5.9 04/08/2023   HGB 12.6 04/08/2023   HCT 38.5 04/08/2023   MCV 82.3 04/08/2023   PLT 243 04/08/2023   Lab Results  Component Value Date   IRON 60 01/14/2018   TIBC 398 01/14/2018   FERRITIN 22 01/14/2018    Attestation Statements:   Reviewed by clinician on day of visit: allergies, medications, problem list, medical history, surgical history, family history, social history, and previous encounter notes.  Time spent on visit including pre-visit chart review and post-visit care and charting was 27 minutes.   I have reviewed the above documentation for accuracy and completeness, and I agree with the above. -   d. , NP-C

## 2023-04-20 ENCOUNTER — Ambulatory Visit: Payer: Medicare HMO

## 2023-04-20 NOTE — Progress Notes (Unsigned)
Patient presents today to pick up custom molded foot orthotics, diagnosed with hammertoes  by Dr. Charlsie Merles. Orthotics were previously sent back to [facility] for remake   Orthotics were dispensed and fit was satisfactory. Reviewed instructions for break-in and wear. Written instructions given to patient.  Patient will follow up as needed.   Mckenzie Hebert Cped, CFo, CFm

## 2023-04-23 ENCOUNTER — Other Ambulatory Visit: Payer: Self-pay | Admitting: Cardiology

## 2023-04-26 ENCOUNTER — Other Ambulatory Visit: Payer: Self-pay | Admitting: *Deleted

## 2023-04-26 MED ORDER — LOSARTAN POTASSIUM 25 MG PO TABS: 25.0000 mg | ORAL_TABLET | Freq: Every day | ORAL | 2 refills | Status: DC

## 2023-04-26 NOTE — Patient Instructions (Signed)
It was a pleasure to see you today. Labs are stab;e. Continue diet and exercise efforts. Return in 6 months, No change in medications.

## 2023-05-03 ENCOUNTER — Other Ambulatory Visit: Payer: Self-pay | Admitting: Obstetrics & Gynecology

## 2023-05-03 ENCOUNTER — Other Ambulatory Visit (HOSPITAL_BASED_OUTPATIENT_CLINIC_OR_DEPARTMENT_OTHER): Payer: Self-pay | Admitting: Obstetrics & Gynecology

## 2023-05-03 DIAGNOSIS — Z1231 Encounter for screening mammogram for malignant neoplasm of breast: Secondary | ICD-10-CM

## 2023-05-03 DIAGNOSIS — E2839 Other primary ovarian failure: Secondary | ICD-10-CM

## 2023-05-17 ENCOUNTER — Ambulatory Visit (INDEPENDENT_AMBULATORY_CARE_PROVIDER_SITE_OTHER): Payer: Medicare HMO | Admitting: Adult Health

## 2023-05-27 DIAGNOSIS — M25561 Pain in right knee: Secondary | ICD-10-CM | POA: Diagnosis not present

## 2023-05-29 ENCOUNTER — Other Ambulatory Visit: Payer: Self-pay | Admitting: Cardiology

## 2023-06-04 ENCOUNTER — Ambulatory Visit
Admission: RE | Admit: 2023-06-04 | Discharge: 2023-06-04 | Disposition: A | Discharge status: Home / Self Care | Payer: Medicare HMO | Source: Ambulatory Visit | Attending: Obstetrics & Gynecology | Admitting: Obstetrics & Gynecology

## 2023-06-04 DIAGNOSIS — Z1231 Encounter for screening mammogram for malignant neoplasm of breast: Secondary | ICD-10-CM | POA: Diagnosis not present

## 2023-06-08 ENCOUNTER — Other Ambulatory Visit: Payer: Self-pay | Admitting: Internal Medicine

## 2023-06-10 ENCOUNTER — Other Ambulatory Visit: Payer: Medicare HMO

## 2023-06-15 ENCOUNTER — Other Ambulatory Visit: Payer: Self-pay | Admitting: Family

## 2023-06-30 ENCOUNTER — Other Ambulatory Visit: Payer: Self-pay

## 2023-07-01 ENCOUNTER — Ambulatory Visit: Payer: Medicare HMO

## 2023-07-01 MED ORDER — ALPRAZOLAM 1 MG PO TABS: ORAL_TABLET | ORAL | 1 refills | Status: DC

## 2023-07-01 NOTE — Progress Notes (Signed)
Orthotics - remaking for patient at no cost she has been back 2 times and we have attempted to adj and remade once prior patient is now developing corn callus areas that are painful

## 2023-07-06 ENCOUNTER — Ambulatory Visit (INDEPENDENT_AMBULATORY_CARE_PROVIDER_SITE_OTHER): Payer: Medicare HMO | Admitting: Adult Health

## 2023-07-06 ENCOUNTER — Encounter (INDEPENDENT_AMBULATORY_CARE_PROVIDER_SITE_OTHER): Payer: Self-pay | Admitting: Adult Health

## 2023-07-06 VITALS — BP 127/79 | HR 67 | Temp 98.0°F | Ht 67.0 in | Wt 279.0 lb

## 2023-07-06 DIAGNOSIS — E66812 Obesity, class 2: Secondary | ICD-10-CM

## 2023-07-06 DIAGNOSIS — F419 Anxiety disorder, unspecified: Secondary | ICD-10-CM

## 2023-07-06 DIAGNOSIS — E669 Obesity, unspecified: Secondary | ICD-10-CM | POA: Diagnosis not present

## 2023-07-06 DIAGNOSIS — E1165 Type 2 diabetes mellitus with hyperglycemia: Secondary | ICD-10-CM

## 2023-07-06 DIAGNOSIS — E559 Vitamin D deficiency, unspecified: Secondary | ICD-10-CM | POA: Diagnosis not present

## 2023-07-06 DIAGNOSIS — Z7984 Long term (current) use of oral hypoglycemic drugs: Secondary | ICD-10-CM | POA: Diagnosis not present

## 2023-07-06 DIAGNOSIS — Z6841 Body Mass Index (BMI) 40.0 and over, adult: Secondary | ICD-10-CM | POA: Diagnosis not present

## 2023-07-06 MED ORDER — VITAMIN D (ERGOCALCIFEROL) 1.25 MG (50000 UNIT) PO CAPS: 50000.0000 [IU] | ORAL_CAPSULE | ORAL | 0 refills | Status: DC

## 2023-07-06 NOTE — Progress Notes (Signed)
WEIGHT SUMMARY AND BIOMETRICS  Vitals Temp: 98 F (36.7 C) BP: 127/79 Pulse Rate: 67 SpO2: 95 %   Anthropometric Measurements Height: 5\' 7"  (1.702 m) Weight: 279 lb (126.6 kg) BMI (Calculated): 43.69 Weight at Last Visit: 272 lb Weight Lost Since Last Visit: 0 Weight Gained Since Last Visit: 7 lb Starting Weight: 278 lb Total Weight Loss (lbs): 0 lb (0 kg) Peak Weight: 295 lb   Body Composition Fat Mass (lbs): 51.8 lbs Muscle Mass (lbs): 127.8 lbs Total Body Water (lbs): 99.6 lbs Visceral Fat Rating : 19   Other Clinical Data Fasting: no Labs: no Today's Visit #: 16 Starting Date: 02/14/20    Chief Complaint:   OBESITY Mckenzie Hebert is here to discuss her progress with her obesity treatment plan. She is on the the Category 4 Plan and states she is following her eating plan approximately 0 % of the time. She states she is exercising Walking 30-45 minutes 3 times per week.   Interim History:  Mckenzie Hebert isn't terribly surprised that she has gained weight- she has not been following the prescribed meal plan.  She has remained active with house work and walking her two large dogs.    Subjective:   1. Type 2 diabetes mellitus with hyperglycemia, without long-term current use of insulin (HCC) 04/13/2023 PCP/Dr. Lenord Fellers assumed responsibility for Metformin therapy. She increased Metformin 500mg  from daily to BID She denies GI upset with increase. She would like to restart Ozempic therapy, if affordable  She reports fasting CBG <135 She denies sx's of hypoglycemia  2. Vit D Def  Latest Reference Range & Units 09/08/22 09:39  Vitamin D, 25-Hydroxy 30.0 - 100.0 ng/mL 30.0   She is on weekly Ergocalciferol- denies N/V/Muscle Weakness  2. Anxiety disorder, unspecified type She reports stable mood. Her dos provide natural stress/anxiety relief. PDMP reviewed- no aberrancies noted.  Assessment/Plan:   1. Type 2 diabetes mellitus with hyperglycemia, without  long-term current use of insulin (HCC) Continue Metformin 500mg  BID with meals  2. Vit D Def Refill Vitamin D, Ergocalciferol, (DRISDOL) 1.25 MG (50000 UNIT) CAPS capsule Take 1 capsule (50,000 Units total) by mouth every 7 (seven) days. Dispense: 12 capsule, Refills: 0 ordered  Check Labs at next OV  3. Anxiety disorder, unspecified type Continue regular walking and time spent with your pets!  4. Obesity, current BMI 43.69  Mckenzie Hebert is currently in the action stage of change. As such, her goal is to continue with weight loss efforts. She has agreed to the Category 4 Plan.   Exercise goals: Older adults should follow the adult guidelines. When older adults cannot meet the adult guidelines, they should be as physically active as their abilities and conditions will allow.  Older adults should do exercises that maintain or improve balance if they are at risk of falling.  Older adults should determine their level of effort for physical activity relative to their level of fitness.  Older adults with chronic conditions should understand whether and how their conditions affect their ability to do regular physical activity safely.  Behavioral modification strategies: increasing lean protein intake, decreasing simple carbohydrates, increasing vegetables, increasing water intake, no skipping meals, meal planning and cooking strategies, keeping healthy foods in the home, and planning for success.  Mckenzie Hebert has agreed to follow-up with our clinic in 4 weeks. She was informed of the importance of frequent follow-up visits to maximize her success with intensive lifestyle modifications for her multiple health conditions.   Complete PAP  for [employer], re: Ozempic coverage. Check Vit D Level   Objective:   Blood pressure 127/79, pulse 67, temperature 98 F (36.7 C), height 5\' 7"  (1.702 m), weight 279 lb (126.6 kg), last menstrual period 03/18/2008, SpO2 95%. Body mass index is 43.7 kg/m.  General:  Cooperative, alert, well developed, in no acute distress. HEENT: Conjunctivae and lids unremarkable. Cardiovascular: Regular rhythm.  Lungs: Normal work of breathing. Neurologic: No focal deficits.   Lab Results  Component Value Date   CREATININE 0.65 04/08/2023   BUN 11 04/08/2023   NA 138 04/08/2023   K 4.4 04/08/2023   CL 103 04/08/2023   CO2 27 04/08/2023   Lab Results  Component Value Date   ALT 20 04/08/2023   AST 19 04/08/2023   ALKPHOS 71 09/03/2022   BILITOT 0.6 04/08/2023   Lab Results  Component Value Date   HGBA1C 6.8 (H) 04/08/2023   HGBA1C 6.8 (H) 11/05/2022   HGBA1C 7.1 (H) 09/08/2022   HGBA1C 6.1 (H) 05/05/2022   HGBA1C 6.3 (H) 01/29/2022   Lab Results  Component Value Date   INSULIN 26.0 (H) 09/08/2022   INSULIN 9.7 09/17/2021   INSULIN 20.7 02/14/2020   Lab Results  Component Value Date   TSH 2.72 04/08/2023   Lab Results  Component Value Date   CHOL 109 04/08/2023   HDL 50 04/08/2023   LDLCALC 40 04/08/2023   TRIG 104 04/08/2023   CHOLHDL 2.2 04/08/2023   Lab Results  Component Value Date   VD25OH 30.0 09/08/2022   VD25OH 42 01/29/2022   VD25OH 31 07/29/2021   Lab Results  Component Value Date   WBC 5.9 04/08/2023   HGB 12.6 04/08/2023   HCT 38.5 04/08/2023   MCV 82.3 04/08/2023   PLT 243 04/08/2023   Lab Results  Component Value Date   IRON 60 01/14/2018   TIBC 398 01/14/2018   FERRITIN 22 01/14/2018   Attestation Statements:   Reviewed by clinician on day of visit: allergies, medications, problem list, medical history, surgical history, family history, social history, and previous encounter notes.  I have reviewed the above documentation for accuracy and completeness, and I agree with the above. -  Mckenzie Hebert d. Mckenzie Bangert, NP-C

## 2023-07-13 NOTE — Progress Notes (Signed)
Patient Care Team: Margaree Mackintosh, MD as PCP - General (Internal Medicine)  Visit Date: 07/20/23  Subjective:    Patient ID: Mckenzie Hebert , Female   DOB: 17-Dec-1955, 67 y.o.    MRN: [id number]   67 y.o. Female presents today for a 53-month follow-up.   History of insomnia treated with alprazolam 1 mg as needed.   History of hypothyroidism treated with levothyroxine 150 mcg daily.   History of hypertension treated with losartan 25 mg daily, metoprolol succinate 25 mg daily. Blood pressure normal in-office today at 110/70. Denies chest pain, shortness of breath. Seen by cardiologist, Dr. Bjorn Pippin.  History of GERD treated with pantoprazole 40 mg daily.   History of hyperlipidemia treated with rosuvastatin 10 mg daily, ezetimibe 10 mg daily.    History of impaired glucose tolerance treated with metformin 500 mg with breakfast. Seen at [facility] and has started Ozempic 0.25 mg weekly. HGBA1c at 7.3% on 07/19/23, up from 6.8% on 04/08/23.  History of Vitamin D deficiency treated with Drisdol 50,000 units weekly.  History of asthma treated with albuterol inhaler as needed.  Past Medical History:  Diagnosis Date   AC (acromioclavicular) joint bone spurs 2008   right foot   Anxiety    Asthma    Back pain    Bilateral swelling of feet    Constipation    Dyspnea    Dysrhythmia 2019   PVC   Endometrial polyp    Heartburn    History of colon polyps    Hypothyroidism    Joint pain    Migraines    Dr Clarisse Gouge   Osteoarthritis    PAC (premature atrial contraction)    Palpitations    cardiologist-  dr hochrein-- normal myoview 2017, holter 2015 mild ectopy   PONV (postoperative nausea and vomiting)    Pre-diabetes 2019   PVC (premature ventricular contraction)    SOB (shortness of breath)    Thyroid disease    Uterine fibroid    Vitamin D deficiency      Family History  Problem Relation Age of Onset   Diabetes Mother    Hypertension Mother    Heart  disease Mother 52       CABG   Stroke Mother    Hyperlipidemia Mother    Cancer Father        lung cancer   Depression Father    Alcohol abuse Father    Diabetes Sister    Hypertension Sister    Thyroid disease Sister        hyperthyroidism   Breast cancer Paternal Aunt    Hypertension Sister    Autoimmune disease Sister    Leukemia Paternal Grandmother    Mental retardation Other     Social History   Social History Narrative   Lives alone.     Widow. 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 No children. Quit smoking at age 52. No alcohol consumption.   Review of Systems  Constitutional:  Negative for fever and malaise/fatigue.  HENT:  Negative for congestion.   Eyes:  Negative for blurred vision.  Respiratory:  Negative for cough and shortness of breath.   Cardiovascular:  Negative for chest pain, palpitations and leg swelling.  Gastrointestinal:  Negative for vomiting.  Musculoskeletal:  Negative for back pain.  Skin:  Negative for rash.  Neurological:  Negative for loss of consciousness and headaches.        Objective:   Vitals: BP 110/70  Pulse 65   Ht 5\' 7"  (1.702 m)   Wt 284 lb (128.8 kg)   LMP 03/18/2008 (LMP Unknown)   SpO2 97%   BMI 44.48 kg/m    Physical Exam Vitals and nursing note reviewed.  Constitutional:      General: She is not in acute distress.    Appearance: Normal appearance. She is not toxic-appearing.  HENT:     Head: Normocephalic and atraumatic.  Neck:     Thyroid: No thyroid mass, thyromegaly or thyroid tenderness.  Cardiovascular:     Rate and Rhythm: Normal rate and regular rhythm. No extrasystoles are present.    Pulses: Normal pulses.     Heart sounds: Normal heart sounds. No murmur heard.    No friction rub. No gallop.  Pulmonary:     Effort: Pulmonary effort is normal. No respiratory distress.     Breath sounds: Normal breath sounds. No wheezing or rales.  Skin:    General: Skin is warm and dry.   Neurological:     Mental Status: She is alert and oriented to person, place, and time. Mental status is at baseline.  Psychiatric:        Mood and Affect: Mood normal.        Behavior: Behavior normal.        Thought Content: Thought content normal.        Judgment: Judgment normal.       Results:   Studies obtained and personally reviewed by me:   Labs:       Component Value Date/Time   NA 138 04/08/2023 0923   NA 137 09/03/2022 1158   K 4.4 04/08/2023 0923   CL 103 04/08/2023 0923   CO2 27 04/08/2023 0923   GLUCOSE 130 (H) 04/08/2023 0923   BUN 11 04/08/2023 0923   BUN 13 09/03/2022 1158   CREATININE 0.65 04/08/2023 0923   CALCIUM 9.8 04/08/2023 0923   PROT 6.9 04/08/2023 0923   PROT 7.0 09/03/2022 1158   ALBUMIN 4.3 09/03/2022 1158   AST 19 04/08/2023 0923   ALT 20 04/08/2023 0923   ALKPHOS 71 09/03/2022 1158   BILITOT 0.6 04/08/2023 0923   BILITOT 0.4 09/03/2022 1158   GFRNONAA 94 01/28/2021 0919   GFRAA 109 01/28/2021 0919     Lab Results  Component Value Date   WBC 5.9 04/08/2023   HGB 12.6 04/08/2023   HCT 38.5 04/08/2023   MCV 82.3 04/08/2023   PLT 243 04/08/2023    Lab Results  Component Value Date   CHOL 109 04/08/2023   HDL 50 04/08/2023   LDLCALC 40 04/08/2023   TRIG 104 04/08/2023   CHOLHDL 2.2 04/08/2023    Lab Results  Component Value Date   HGBA1C 7.3 (H) 07/19/2023     Lab Results  Component Value Date   TSH 2.72 04/08/2023      Assessment & Plan:   Insomnia: treated with alprazolam 1 mg at bedtime. Has taken this long term with no side effects. Helps her anxiety.   Hypothyroidism: treated with levothyroxine 150 mcg daily. TSH was stable in August   Hypertension: treated with losartan 25 mg daily, metoprolol succinate 25 mg daily. Blood pressure normal in-office today at 110/70. Seen by cardiologist, Dr. Bjorn Pippin.  GERD: stable with pantoprazole 40 mg daily.   Hyperlipidemia: treated with rosuvastatin 10 mg daily,  ezetimibe 10 mg daily.    Impaired glucose tolerance: treated with metformin 500 mg with breakfast, Ozempic 0.25 mg weekly. HGBA1c at  7.3% on 07/19/23, up from 6.8% on 04/08/23. Just started Ozempic and hopefully will improve.  Vitamin D deficiency: treated with Drisdol 50,000 units weekly.  Asthma: treated with albuterol inhaler as needed.   Will be following up with healthy weight clinic and I think Hgb AIC will be better on Ozempic.    I,Alexander Ruley,acting as a Neurosurgeon for Margaree Mackintosh, MD.,have documented all relevant documentation on the behalf of Margaree Mackintosh, MD,as directed by  Margaree Mackintosh, MD while in the presence of Margaree Mackintosh, MD.   I, Margaree Mackintosh, MD, have reviewed all documentation for this visit. The documentation on 08/01/23 for the exam, diagnosis, procedures, and orders are all accurate and complete.

## 2023-07-19 ENCOUNTER — Other Ambulatory Visit: Payer: Medicare HMO

## 2023-07-19 DIAGNOSIS — E1165 Type 2 diabetes mellitus with hyperglycemia: Secondary | ICD-10-CM | POA: Diagnosis not present

## 2023-07-20 ENCOUNTER — Encounter: Payer: Self-pay | Admitting: Internal Medicine

## 2023-07-20 ENCOUNTER — Ambulatory Visit (INDEPENDENT_AMBULATORY_CARE_PROVIDER_SITE_OTHER): Payer: Medicare HMO | Admitting: Internal Medicine

## 2023-07-20 VITALS — BP 110/70 | HR 65 | Ht 67.0 in | Wt 284.0 lb

## 2023-07-20 DIAGNOSIS — E1159 Type 2 diabetes mellitus with other circulatory complications: Secondary | ICD-10-CM | POA: Diagnosis not present

## 2023-07-20 DIAGNOSIS — E785 Hyperlipidemia, unspecified: Secondary | ICD-10-CM | POA: Diagnosis not present

## 2023-07-20 DIAGNOSIS — I152 Hypertension secondary to endocrine disorders: Secondary | ICD-10-CM | POA: Diagnosis not present

## 2023-07-20 DIAGNOSIS — F419 Anxiety disorder, unspecified: Secondary | ICD-10-CM | POA: Diagnosis not present

## 2023-07-20 DIAGNOSIS — E039 Hypothyroidism, unspecified: Secondary | ICD-10-CM | POA: Diagnosis not present

## 2023-07-20 DIAGNOSIS — E1165 Type 2 diabetes mellitus with hyperglycemia: Secondary | ICD-10-CM | POA: Diagnosis not present

## 2023-07-20 DIAGNOSIS — E1169 Type 2 diabetes mellitus with other specified complication: Secondary | ICD-10-CM

## 2023-07-20 LAB — HEMOGLOBIN A1C
Hgb A1c MFr Bld: 7.3 %{Hb} — ABNORMAL HIGH (ref <5.7)
Mean Plasma Glucose: 163 mg/dL
eAG (mmol/L): 9 mmol/L

## 2023-07-22 ENCOUNTER — Ambulatory Visit (INDEPENDENT_AMBULATORY_CARE_PROVIDER_SITE_OTHER): Payer: Medicare HMO | Admitting: Adult Health

## 2023-07-22 ENCOUNTER — Encounter (INDEPENDENT_AMBULATORY_CARE_PROVIDER_SITE_OTHER): Payer: Self-pay | Admitting: Adult Health

## 2023-07-22 VITALS — BP 119/75 | HR 70 | Temp 98.0°F | Ht 67.0 in | Wt 280.0 lb

## 2023-07-22 DIAGNOSIS — E559 Vitamin D deficiency, unspecified: Secondary | ICD-10-CM

## 2023-07-22 DIAGNOSIS — E1159 Type 2 diabetes mellitus with other circulatory complications: Secondary | ICD-10-CM | POA: Diagnosis not present

## 2023-07-22 DIAGNOSIS — Z7984 Long term (current) use of oral hypoglycemic drugs: Secondary | ICD-10-CM

## 2023-07-22 DIAGNOSIS — E1165 Type 2 diabetes mellitus with hyperglycemia: Secondary | ICD-10-CM | POA: Diagnosis not present

## 2023-07-22 DIAGNOSIS — E669 Obesity, unspecified: Secondary | ICD-10-CM | POA: Diagnosis not present

## 2023-07-22 DIAGNOSIS — I152 Hypertension secondary to endocrine disorders: Secondary | ICD-10-CM

## 2023-07-22 DIAGNOSIS — Z6841 Body Mass Index (BMI) 40.0 and over, adult: Secondary | ICD-10-CM | POA: Diagnosis not present

## 2023-07-22 DIAGNOSIS — E66812 Obesity, class 2: Secondary | ICD-10-CM

## 2023-07-22 DIAGNOSIS — Z7985 Long-term (current) use of injectable non-insulin antidiabetic drugs: Secondary | ICD-10-CM | POA: Diagnosis not present

## 2023-07-22 MED ORDER — OZEMPIC (0.25 OR 0.5 MG/DOSE) 2 MG/3ML ~~LOC~~ SOPN: 0.5000 mg | PEN_INJECTOR | SUBCUTANEOUS | 0 refills | Status: DC

## 2023-07-22 NOTE — Progress Notes (Signed)
WEIGHT SUMMARY AND BIOMETRICS  Vitals Temp: 98 F (36.7 C) BP: 119/75 Pulse Rate: 70 SpO2: 98 %   Anthropometric Measurements Height: 5\' 7"  (1.702 m) Weight: 280 lb (127 kg) BMI (Calculated): 43.84 Weight at Last Visit: 279 lb Weight Lost Since Last Visit: 0 Weight Gained Since Last Visit: 1 lb Starting Weight: 278 lb Total Weight Loss (lbs): 0 lb (0 kg) Peak Weight: 295 lb   Body Composition  Body Fat %: 52.1 % Fat Mass (lbs): 146.2 lbs Muscle Mass (lbs): 127.8 lbs Total Body Water (lbs): 98.6 lbs Visceral Fat Rating : 19   Other Clinical Data Fasting: no Labs: no Today's Visit #: 17 Starting Date: 02/14/20    Chief Complaint:   OBESITY Mckenzie Hebert is here to discuss her progress with her obesity treatment plan. She is on the the Category 4 Plan and states she is following her eating plan approximately 25 % of the time.  She states she is exercising NEAT and dog walking daily.   Interim History:  Mckenzie Hebert had recent OV with PCP A1c was found to be elevates at 7.3 She is currently on Metformin 500mg  BID  She was provided Rx of Ozempic 0.25mg  May 2024 She took one dose and requested to stop due to concerns of SE and cost.  She is agreeable to re-start Will complete Pt Assist Program with [employer] for Rx coverage  Subjective:   1. Vitamin D deficiency  Latest Reference Range & Units 09/08/22 09:39  Vitamin D, 25-Hydroxy 30.0 - 100.0 ng/mL 30.0   She is on weekly Ergocalciferol- denies N/V/Muscle Weakness  2. Hypertension associated with type 2 diabetes mellitus (HCC) BP at goal at OV Sh is on Cozaar 25mg  daily and Topril XL 25mg  1.5 tabs daily  3. Type 2 diabetes mellitus with hyperglycemia, without long-term current use of insulin (HCC) Lab Results  Component Value Date   HGBA1C 7.3 (H) 07/19/2023   HGBA1C 6.8 (H) 04/08/2023   HGBA1C 6.8 (H) 11/05/2022    PCP is managing Metformin 500mg  BID A1c above goal PCP requests that [facility]  manage T2D and associated medicaitons. She was provided Ozmepic 0.25mg  Rx 01/20/2023 She took one dose then requested to stop due to cost of Rx She restarted Ozempic 0.25mg  on   Assessment/Plan:   1. Vitamin D deficiency Refill Vitamin D, Ergocalciferol, (DRISDOL) 1.25 MG (50000 UNIT) CAPS capsule Take 1 capsule (50,000 Units total) by mouth every 7 (seven) days. Dispense: 12 capsule, Refills: 0 ordered   Check Vit D Level at next OV  2. Hypertension associated with type 2 diabetes mellitus (HCC) Continue  3. Type 2 diabetes mellitus with hyperglycemia, without long-term current use of insulin (HCC) Continue Metformin 500mg  BID Take remaining Ozempic 0.25mg  11/18 11/25 Then refill and INCREASE on 12/2   Semaglutide,0.25 or 0.5MG /DOS, (OZEMPIC, 0.25 OR 0.5 MG/DOSE,) 2 MG/3ML SOPN Inject 0.5 mg as directed once a week. Dispense: 3 mL, Refills: 0 ordered   4. Obesity, current BMI 43.84  Mckenzie Hebert is currently in the action stage of change. As such, her goal is to continue with weight loss efforts. She has agreed to the Category 4 Plan.   Exercise goals: Older adults should follow the adult guidelines. When older adults cannot meet the adult guidelines, they should be as physically active as their abilities and conditions will allow.  Older adults should do exercises that maintain or improve balance if they are at risk of falling.  Older adults should determine their  level of effort for physical activity relative to their level of fitness.  Older adults with chronic conditions should understand whether and how their conditions affect their ability to do regular physical activity safely.  Behavioral modification strategies: increasing lean protein intake, decreasing simple carbohydrates, increasing vegetables, increasing water intake, decreasing liquid calories, no skipping meals, meal planning and cooking strategies, keeping healthy foods in the home, and planning for success.  Mckenzie Hebert has  agreed to follow-up with our clinic in 2 weeks. She was informed of the importance of frequent follow-up visits to maximize her success with intensive lifestyle modifications for her multiple health conditions.   Check Vit D Level at next OV  Objective:   Blood pressure 119/75, pulse 70, temperature 98 F (36.7 C), height 5\' 7"  (1.702 m), weight 280 lb (127 kg), last menstrual period 03/18/2008, SpO2 98%. Body mass index is 43.85 kg/m.  General: Cooperative, alert, well developed, in no acute distress. HEENT: Conjunctivae and lids unremarkable. Cardiovascular: Regular rhythm.  Lungs: Normal work of breathing. Neurologic: No focal deficits.   Lab Results  Component Value Date   CREATININE 0.65 04/08/2023   BUN 11 04/08/2023   NA 138 04/08/2023   K 4.4 04/08/2023   CL 103 04/08/2023   CO2 27 04/08/2023   Lab Results  Component Value Date   ALT 20 04/08/2023   AST 19 04/08/2023   ALKPHOS 71 09/03/2022   BILITOT 0.6 04/08/2023   Lab Results  Component Value Date   HGBA1C 7.3 (H) 07/19/2023   HGBA1C 6.8 (H) 04/08/2023   HGBA1C 6.8 (H) 11/05/2022   HGBA1C 7.1 (H) 09/08/2022   HGBA1C 6.1 (H) 05/05/2022   Lab Results  Component Value Date   INSULIN 26.0 (H) 09/08/2022   INSULIN 9.7 09/17/2021   INSULIN 20.7 02/14/2020   Lab Results  Component Value Date   TSH 2.72 04/08/2023   Lab Results  Component Value Date   CHOL 109 04/08/2023   HDL 50 04/08/2023   LDLCALC 40 04/08/2023   TRIG 104 04/08/2023   CHOLHDL 2.2 04/08/2023   Lab Results  Component Value Date   VD25OH 30.0 09/08/2022   VD25OH 42 01/29/2022   VD25OH 31 07/29/2021   Lab Results  Component Value Date   WBC 5.9 04/08/2023   HGB 12.6 04/08/2023   HCT 38.5 04/08/2023   MCV 82.3 04/08/2023   PLT 243 04/08/2023   Lab Results  Component Value Date   IRON 60 01/14/2018   TIBC 398 01/14/2018   FERRITIN 22 01/14/2018   Attestation Statements:   Reviewed by clinician on day of visit:  allergies, medications, problem list, medical history, surgical history, family history, social history, and previous encounter notes.  I have reviewed the above documentation for accuracy and completeness, and I agree with the above. -  Luci Bellucci d. Alvia Jablonski, NP-C

## 2023-08-01 NOTE — Patient Instructions (Signed)
Please continue follow-up with healthy weight clinic.  I believe your hemoglobin A1c will improve considerably starting Ozempic weekly.  Continue current medications including rosuvastatin and ezetimibe.  Blood pressure normal in office today.  Continue alprazolam 1 mg at bedtime for anxiety/insomnia.  You have a follow-up visit for TSH check in February but if healthy weight clinic checks this, you may not need follow-up visit in February.  We are happy to see you in February regardless.

## 2023-08-03 ENCOUNTER — Ambulatory Visit (INDEPENDENT_AMBULATORY_CARE_PROVIDER_SITE_OTHER): Payer: Medicare HMO | Admitting: Adult Health

## 2023-08-24 ENCOUNTER — Encounter (INDEPENDENT_AMBULATORY_CARE_PROVIDER_SITE_OTHER): Payer: Self-pay | Admitting: Adult Health

## 2023-08-24 ENCOUNTER — Ambulatory Visit (INDEPENDENT_AMBULATORY_CARE_PROVIDER_SITE_OTHER): Payer: Medicare HMO | Admitting: Adult Health

## 2023-08-24 VITALS — BP 115/71 | HR 71 | Temp 98.1°F | Ht 67.0 in | Wt 276.0 lb

## 2023-08-24 DIAGNOSIS — E669 Obesity, unspecified: Secondary | ICD-10-CM | POA: Diagnosis not present

## 2023-08-24 DIAGNOSIS — E1165 Type 2 diabetes mellitus with hyperglycemia: Secondary | ICD-10-CM

## 2023-08-24 DIAGNOSIS — L820 Inflamed seborrheic keratosis: Secondary | ICD-10-CM | POA: Diagnosis not present

## 2023-08-24 DIAGNOSIS — E559 Vitamin D deficiency, unspecified: Secondary | ICD-10-CM | POA: Diagnosis not present

## 2023-08-24 DIAGNOSIS — L538 Other specified erythematous conditions: Secondary | ICD-10-CM | POA: Diagnosis not present

## 2023-08-24 DIAGNOSIS — Z7985 Long-term (current) use of injectable non-insulin antidiabetic drugs: Secondary | ICD-10-CM

## 2023-08-24 DIAGNOSIS — E1159 Type 2 diabetes mellitus with other circulatory complications: Secondary | ICD-10-CM

## 2023-08-24 DIAGNOSIS — R208 Other disturbances of skin sensation: Secondary | ICD-10-CM | POA: Diagnosis not present

## 2023-08-24 DIAGNOSIS — I152 Hypertension secondary to endocrine disorders: Secondary | ICD-10-CM | POA: Diagnosis not present

## 2023-08-24 DIAGNOSIS — Z6841 Body Mass Index (BMI) 40.0 and over, adult: Secondary | ICD-10-CM | POA: Diagnosis not present

## 2023-08-24 MED ORDER — OZEMPIC (0.25 OR 0.5 MG/DOSE) 2 MG/3ML ~~LOC~~ SOPN: 0.5000 mg | PEN_INJECTOR | SUBCUTANEOUS | 0 refills | Status: DC

## 2023-08-24 MED ORDER — VITAMIN D (ERGOCALCIFEROL) 1.25 MG (50000 UNIT) PO CAPS: 50000.0000 [IU] | ORAL_CAPSULE | ORAL | 0 refills | Status: DC

## 2023-08-24 NOTE — Progress Notes (Addendum)
WEIGHT SUMMARY AND BIOMETRICS  Vitals Temp: 98.1 F (36.7 C) BP: 115/71 Pulse Rate: 71 SpO2: 96 %   Anthropometric Measurements Height: 5\' 7"  (1.702 m) Weight: 276 lb (125.2 kg) BMI (Calculated): 43.22 Weight at Last Visit: 280 lb Weight Lost Since Last Visit: 4 lb Weight Gained Since Last Visit: 0 Starting Weight: 278 lb Total Weight Loss (lbs): 4 lb (1.814 kg) Peak Weight: 295 lb   Body Composition  Body Fat %: 51.8 % Fat Mass (lbs): 143 lbs Muscle Mass (lbs): 126.2 lbs Total Body Water (lbs): 97.2 lbs Visceral Fat Rating : 18   Other Clinical Data Fasting: no Labs: no Today's Visit #: 18 Starting Date: 02/14/20    Chief Complaint:   OBESITY Mckenzie Hebert is here to discuss her progress with her obesity treatment plan. She is on the the Category 4 Plan and states she is following her eating plan approximately 75 % of the time.  She states she is exercising Dog Walking 15 minutes 3 times per week.   Interim History:   2025 Health Goals 1) Ultimate weight loss goal, loss down to 185lbs with corresponding BMI 29 Current weight 276 lbs with corresponding BMI 43.2 2) increase overall energy levels  Subjective:   1. Hypertension associated with type 2 diabetes mellitus (HCC) BP excellent at OV She is currently on daily Losartan 25mg  and Metoprolol Succinate XL 25mg   She would like to d/c BB as it causes fatigue.  Cards/Dr. Milus Mallick on 12/22/2022- OV Notes  Near syncope: Unclear cause.  Echocardiogram on 11/29/2020 showed normal biventricular function, no significant valvular disease.  Zio patch x14 days on 01/30/2021 showed frequent PVCs (10% of beats), frequent PACs (10%), 31 episodes of SVT with longest lasting 14 seconds.   -Continue Toprol-XL   Frequent PACs/PVCs: Zio patch x14 days on 01/30/2021 showed frequent PVCs (10% of beats), frequent PACs (10%), 31 episodes of SVT with longest lasting 14 seconds.  Repeat Zio patch x3 days on metoprolol showed 4  episodes of SVT (longest lasting 7 beats), frequent PACs (15% of beats), occasional PVCs (4% of beats). -Continue Toprol-XL.  She reports significant fatigue on 37.5 mg daily, reduced dose back to 25 mg daily  2. Vitamin D deficiency  Latest Reference Range & Units 11/05/22 09:16 04/08/23 09:23  Alkaline phosphatase (APISO) 37 - 153 U/L 60 56   She is on weekly Ergocalciferol- denies N/V/Muscle Weakness She endorses fatigue, however believes it is r/t to daily Toprol XL therapy  3. Type 2 diabetes mellitus with hyperglycemia, without long-term current use of insulin (HCC) Lab Results  Component Value Date   HGBA1C 7.3 (H) 07/19/2023   HGBA1C 6.8 (H) 04/08/2023   HGBA1C 6.8 (H) 11/05/2022    Fasting CBG <130 Last two mornings readings- 113, 123 She restarted Ozempic 0.25mg  on/about 07/19/2023 She had 3 doses of Ozempic 0.25mg  She started 0.5mg  strength 08/16/2023, second dose 08/23/2023 She reports mild nausea without vomiting and dyspepsia the first 36 hours after injection. She has hx of GERD- takes daily Protonix 40mg   Assessment/Plan:   1. Hypertension associated with type 2 diabetes mellitus (HCC) Continue daily Losartan 25mg  and Metoprolol Succinate XL 25mg  Continue regular walking  2. Vitamin D deficiency (Primary) Refill Vitamin D, Ergocalciferol, (DRISDOL) 1.25 MG (50000 UNIT) CAPS capsule Take 1 capsule (50,000 Units total) by mouth every 7 (seven) days. Dispense: 12 capsule, Refills: 0 ordered   3. Type 2 diabetes mellitus with hyperglycemia, without long-term current use of insulin (HCC) Refill Semaglutide,0.25  or 0.5MG /DOS, (OZEMPIC, 0.25 OR 0.5 MG/DOSE,) 2 MG/3ML SOPN Inject 0.5 mg as directed once a week. Dispense: 3 mL, Refills: 0 ordered   4. Obesity, current BMI 43.22  Mckenzie Hebert is currently in the action stage of change. As such, her goal is to continue with weight loss efforts. She has agreed to the Category 4 Plan.   Exercise goals: Older adults should follow  the adult guidelines. When older adults cannot meet the adult guidelines, they should be as physically active as their abilities and conditions will allow.  Older adults should do exercises that maintain or improve balance if they are at risk of falling.  Older adults should determine their level of effort for physical activity relative to their level of fitness.  Older adults with chronic conditions should understand whether and how their conditions affect their ability to do regular physical activity safely.  Behavioral modification strategies: increasing lean protein intake, decreasing simple carbohydrates, increasing vegetables, increasing water intake, no skipping meals, meal planning and cooking strategies, keeping healthy foods in the home, ways to avoid boredom eating, and planning for success.  Mckenzie Hebert has agreed to follow-up with our clinic in 4 weeks. She was informed of the importance of frequent follow-up visits to maximize her success with intensive lifestyle modifications for her multiple health conditions.   Check Fasting Labs at next OV  Objective:   Blood pressure 115/71, pulse 71, temperature 98.1 F (36.7 C), height 5\' 7"  (1.702 m), weight 276 lb (125.2 kg), last menstrual period 03/18/2008, SpO2 96%. Body mass index is 43.23 kg/m.  General: Cooperative, alert, well developed, in no acute distress. HEENT: Conjunctivae and lids unremarkable. Cardiovascular: Regular rhythm.  Lungs: Normal work of breathing. Neurologic: No focal deficits.   Lab Results  Component Value Date   CREATININE 0.65 04/08/2023   BUN 11 04/08/2023   NA 138 04/08/2023   K 4.4 04/08/2023   CL 103 04/08/2023   CO2 27 04/08/2023   Lab Results  Component Value Date   ALT 20 04/08/2023   AST 19 04/08/2023   ALKPHOS 71 09/03/2022   BILITOT 0.6 04/08/2023   Lab Results  Component Value Date   HGBA1C 7.3 (H) 07/19/2023   HGBA1C 6.8 (H) 04/08/2023   HGBA1C 6.8 (H) 11/05/2022   HGBA1C 7.1 (H)  09/08/2022   HGBA1C 6.1 (H) 05/05/2022   Lab Results  Component Value Date   INSULIN 26.0 (H) 09/08/2022   INSULIN 9.7 09/17/2021   INSULIN 20.7 02/14/2020   Lab Results  Component Value Date   TSH 2.72 04/08/2023   Lab Results  Component Value Date   CHOL 109 04/08/2023   HDL 50 04/08/2023   LDLCALC 40 04/08/2023   TRIG 104 04/08/2023   CHOLHDL 2.2 04/08/2023   Lab Results  Component Value Date   VD25OH 30.0 09/08/2022   VD25OH 42 01/29/2022   VD25OH 31 07/29/2021   Lab Results  Component Value Date   WBC 5.9 04/08/2023   HGB 12.6 04/08/2023   HCT 38.5 04/08/2023   MCV 82.3 04/08/2023   PLT 243 04/08/2023   Lab Results  Component Value Date   IRON 60 01/14/2018   TIBC 398 01/14/2018   FERRITIN 22 01/14/2018   Attestation Statements:   Reviewed by clinician on day of visit: allergies, medications, problem list, medical history, surgical history, family history, social history, and previous encounter notes.  I have reviewed the above documentation for accuracy and completeness, and I agree with the above. Orpha Bur  d. Maryfrances Bunnell, NP-C

## 2023-08-29 ENCOUNTER — Encounter (INDEPENDENT_AMBULATORY_CARE_PROVIDER_SITE_OTHER): Payer: Self-pay | Admitting: Adult Health

## 2023-09-01 NOTE — Progress Notes (Unsigned)
[facility] Office Note:    Date:  09/02/2023   ID:  Mckenzie Hebert, DOB Jan 09, 1956, MRN [id number]  PCP:  Margaree Mackintosh, MD  Cardiologist:  None  Electrophysiologist:  None   Referring MD: Margaree Mackintosh, MD   Chief Complaint  Patient presents with   Follow-up    6 months.   Irregular Heart Beat     History of Present Illness:    Mckenzie Hebert is a 67 y.o. female with a hx of hypothyroidism, prediabetes, frequent PACs/PVCs who presents for follow-up.  She was referred by Dr. Lenord Fellers for evaluation of near syncopal episode, initially seen on 11/15/2020.  She works as a Community education officer and reports that on 10/10/2020 she was sitting down and felt lightheaded.  She denies any chest pain, dyspnea, palpitations.  States that she felt she was going to pass out, but turned her fan on and started taking deep breaths and symptoms improved.  She reports she lost 50 pounds from June to November last year.  Reports occasional chest pain, which she states occurs a couple times per year and describes as dull aching pain in the center of her chest that can last up to 30 minutes.  She denies any exertional chest pain.  States that she can walk up a flight of stairs without stopping, denies any chest pain or shortness of breath with this.  She smoked in her teens and 63s.  Family history includes mother had CABG and CHF, died during a nuclear stress test.  Lexiscan Myoview on 10/11/2015 showed normal perfusion, EF 64%.  Echocardiogram on 11/29/2020 showed normal biventricular function, no significant valvular disease.  Zio patch x14 days on 01/30/2021 showed frequent PVCs (10% of beats), frequent PACs (10%), 31 episodes of SVT with longest lasting 14 seconds.  Calcium score 353 (94th percentile) on 01/30/2021.  Repeat Zio patch x3 days on metoprolol showed 4 episodes of SVT (longest lasting 7 beats), frequent PACs (15% of beats), occasional PVCs (4% of beats).  Since last clinic visit, she reports she  is doing okay.  She started Ozempic. Denies any chest pain, dyspnea, lightheadedness, syncope, lower extremity edema, or palpitations.  Walking dog 15-20 minutes per day.     Wt Readings from Last 3 Encounters:  09/02/23 280 lb (127 kg)  08/24/23 276 lb (125.2 kg)  07/22/23 280 lb (127 kg)   BP Readings from Last 3 Encounters:  09/02/23 112/62  08/24/23 115/71  07/22/23 119/75       Past Medical History:  Diagnosis Date   AC (acromioclavicular) joint bone spurs 2008   right foot   Anxiety    Asthma    Back pain    Bilateral swelling of feet    Constipation    Dyspnea    Dysrhythmia 2019   PVC   Endometrial polyp    Heartburn    History of colon polyps    Hypothyroidism    Joint pain    Migraines    Dr Clarisse Gouge   Osteoarthritis    PAC (premature atrial contraction)    Palpitations    cardiologist-  dr hochrein-- normal myoview 2017, holter 2015 mild ectopy   PONV (postoperative nausea and vomiting)    Pre-diabetes 2019   PVC (premature ventricular contraction)    SOB (shortness of breath)    Thyroid disease    Uterine fibroid    Vitamin D deficiency     Past Surgical History:  Procedure Laterality Date   APPENDECTOMY  CARDIOVASCULAR STRESS TEST  10-11-2015   dr hochrein   normal nuclear study w/ no ischemia/  normal LV function and wall motion, nuclear stress ef 64%   D & C HYSTERSCOPY W/ RESECTION POLYP  01-16-2010   dr gottsegen  @ [facility]   DILATATION & CURETTAGE/HYSTEROSCOPY WITH MYOSURE N/A 06/07/2018   Procedure: DILATATION & CURETTAGE/HYSTEROSCOPY WITH MYOSURE;  Surgeon: Dara Lords, MD;  Location: [facility];  Service: Gynecology;  Laterality: N/A;  request to follow first case in [facility] block time  requests one hour   FOOT SURGERY  2002, 2008   bi-lat , bone spurs, achilles tendon work   HAMMER TOE SURGERY  11/2018   RESECTION TUMOR CLAVICLE RADICAL  1985   benign   TONSILLECTOMY     TOTAL KNEE ARTHROPLASTY Left  12/09/2020   Procedure: TOTAL KNEE ARTHROPLASTY;  Surgeon: Ollen Gross, MD;  Location: [facility];  Service: Orthopedics;  Laterality: Left;     Current Medications: Current Meds  Medication Sig   albuterol (VENTOLIN HFA) 108 (90 Base) MCG/ACT inhaler INHALE 2 PUFFS EVERY 6 HRS AS NEEDED   ALPRAZolam (XANAX) 1 MG tablet TAKE 1 TABLET BY MOUTH AS NEEDED FOR SLEEP   blood glucose meter kit and supplies KIT 1 each by Does not apply route daily as needed. Check glucose daily before breakfast and supper.   Blood Glucose Monitoring Suppl (TRUE METRIX AIR GLUCOSE METER) DEVI 1 Device by Does not apply route daily. E11.65   clobetasol ointment (TEMOVATE) 0.05 % Apply 1 application topically 2 (two) times daily.   clotrimazole-betamethasone (LOTRISONE) cream Apply 1 application topically 2 (two) times daily.   docusate sodium (COLACE) 100 MG capsule Take 100 mg by mouth 2 (two) times daily.   ezetimibe (ZETIA) 10 MG tablet TAKE 1 TABLET BY MOUTH EVERY DAY   fluocinonide-emollient (LIDEX-E) 0.05 % cream Apply 1 application topically 2 (two) times daily.   glucose blood (TRUE METRIX BLOOD GLUCOSE TEST) test strip Use as instructed   Lancets 28G MISC 1 each by Does not apply route daily. DX E11.65   levothyroxine (SYNTHROID) 150 MCG tablet TAKE 1 TABLET BY MOUTH EVERY DAY   losartan (COZAAR) 25 MG tablet Take 1 tablet (25 mg total) by mouth daily.   metFORMIN (GLUCOPHAGE) 500 MG tablet Take 1 tablet (500 mg total) by mouth 2 (two) times daily with a meal.   metoprolol succinate (TOPROL-XL) 25 MG 24 hr tablet TAKE 1.5 TABLET BY MOUTH DAILY (Patient taking differently: Take 25 mg by mouth daily.)   pantoprazole (PROTONIX) 40 MG tablet TAKE 1 TABLET BY MOUTH EVERY DAY   rosuvastatin (CRESTOR) 10 MG tablet TAKE 1 TABLET BY MOUTH EVERY DAY   Semaglutide,0.25 or 0.5MG /DOS, (OZEMPIC, 0.25 OR 0.5 MG/DOSE,) 2 MG/3ML SOPN Inject 0.5 mg as directed once a week.   terconazole (TERAZOL 7) 0.4 % vaginal cream  Place 1 applicator vaginally at bedtime.   Vitamin D, Ergocalciferol, (DRISDOL) 1.25 MG (50000 UNIT) CAPS capsule Take 1 capsule (50,000 Units total) by mouth every 7 (seven) days.     Allergies:   Percocet [oxycodone-acetaminophen]   Social History   Socioeconomic History   Marital status: Widowed    Spouse name: Not on file   Number of children: Not on file   Years of education: Not on file   Highest education level: Not on file  Occupational History   Occupation: Histology    Employer: [employer]  Tobacco Use   Smoking status:  Former    Current packs/day: 0.00    Types: Cigarettes    Quit date: 1973    Years since quitting: 52.0   Smokeless tobacco: Never  Vaping Use   Vaping status: Never Used  Substance and Sexual Activity   Alcohol use: No    Alcohol/week: 0.0 standard drinks of alcohol   Drug use: No   Sexual activity: Not Currently    Comment: INTERCOURSE AGE 64, SEXUAL PARTNERS LESS THAN 5  Other Topics Concern   Not on file  Social History Narrative   Lives alone.     Social Drivers of Corporate investment banker Strain: Not on file  Food Insecurity: No Food Insecurity (04/13/2023)   Hunger Vital Sign    Worried About Running Out of Food in the Last Year: Never true    Ran Out of Food in the Last Year: Never true  Transportation Needs: Not on file  Physical Activity: Not on file  Stress: Not on file  Social Connections: Not on file     Family History: The patient'sfamily history includes Alcohol abuse in her father; Autoimmune disease in her sister; Breast cancer in her paternal aunt; Cancer in her father; Depression in her father; Diabetes in her mother and sister; Heart disease (age of onset: 5) in her mother; Hyperlipidemia in her mother; Hypertension in her mother, sister, and sister; Leukemia in her paternal grandmother; Mental retardation in an other family member; Stroke in her mother; Thyroid disease in her sister.  ROS:   Please see  the history of present illness.    All other systems reviewed and are negative.  EKGs/Labs/Other Studies Reviewed:    The following studies were reviewed today:   EKG:   08/26/2022: Normal sinus rhythm, incomplete right bundle branch block, right axis deviation, rate 68 09/02/2023: Sinus rhythm with PACs, incomplete right bundle branch block, low voltage  Recent Labs: 04/08/2023: ALT 20; BUN 11; Creat 0.65; Hemoglobin 12.6; Platelets 243; Potassium 4.4; Sodium 138; TSH 2.72  Recent Lipid Panel    Component Value Date/Time   CHOL 109 04/08/2023 0923   CHOL 132 07/04/2021 1609   TRIG 104 04/08/2023 0923   HDL 50 04/08/2023 0923   HDL 63 07/04/2021 1609   CHOLHDL 2.2 04/08/2023 0923   VLDL 17 05/12/2016 0933   LDLCALC 40 04/08/2023 0923    Physical Exam:    VS:  BP 112/62 (BP Location: Left Arm, Patient Position: Sitting, Cuff Size: Large)   Pulse 73   Ht 5\' 7"  (1.702 m)   Wt 280 lb (127 kg)   LMP 03/18/2008 (LMP Unknown)   BMI 43.85 kg/m     Wt Readings from Last 3 Encounters:  09/02/23 280 lb (127 kg)  08/24/23 276 lb (125.2 kg)  07/22/23 280 lb (127 kg)     GEN: Well nourished, well developed in no acute distress HEENT: Normal NECK: No JVD; No carotid bruits CARDIAC: Irregular, normal rate, no murmurs RESPIRATORY:  Clear to auscultation without rales, wheezing or rhonchi  ABDOMEN: Soft, non-tender, non-distended MUSCULOSKELETAL:  No edema; No deformity  SKIN: Warm and dry NEUROLOGIC:  Alert and oriented x 3 PSYCHIATRIC:  Normal affect   ASSESSMENT:    1. PVC's (premature ventricular contractions)   2. Near syncope   3. Premature atrial contractions   4. Morbid obesity (HCC)   5. Hyperlipidemia, unspecified hyperlipidemia type   6. Essential hypertension   7. Dilatation of aorta (HCC)       PLAN:  Near syncope: Unclear cause.  Echocardiogram on 11/29/2020 showed normal biventricular function, no significant valvular disease.  Zio patch x14 days on  01/30/2021 showed frequent PVCs (10% of beats), frequent PACs (10%), 31 episodes of SVT with longest lasting 14 seconds.   -Continue Toprol-XL  Frequent PACs/PVCs: Zio patch x14 days on 01/30/2021 showed frequent PVCs (10% of beats), frequent PACs (10%), 31 episodes of SVT with longest lasting 14 seconds.  Repeat Zio patch x3 days on metoprolol showed 4 episodes of SVT (longest lasting 7 beats), frequent PACs (15% of beats), occasional PVCs (4% of beats). -Continue Toprol-XL.  She reports significant fatigue on 37.5 mg daily, reduced dose back to 25 mg daily  Hyperlipidemia: On Zetia 10 mg daily.  LDL 127 on 01/28/21.  10-year ASCVD risk score 5%.  Calcium score 353 (94th percentile) on 01/30/2021.  Started rosuvastatin 10 mg daily.  LDL 40 on 04/2023  Hypertension: On Toprol-XL 25 mg daily and losartan 25 mg daily.  Appears controlled  Dilated aorta: Dilated ascending aorta measured 41 mm on calcium score 02/27/2021.  CTA chest 03/2022 showed no aortic aneurysm  Daytime somnolence: Sleep study 05/09/2021 showed mild OSA, no indication for CPAP at this time  Morbid obesity: Body mass index is 43.85 kg/m.  Previously lost 30 pounds following with healthy weight and wellness but has gained weight back.  Has reestablished with healthy weight and wellness and just started Ozempic  Pulmonary nodule: 5 mm pulmonary nodule in CTA chest 03/2022.  Given lack of risk factors, no follow-up needed   RTC in 6 months   Medication Adjustments/Labs and Tests Ordered: Current medicines are reviewed at length with the patient today.  Concerns regarding medicines are outlined above.  Orders Placed This Encounter  Procedures   EKG 12-Lead    No orders of the defined types were placed in this encounter.     Patient Instructions  Medication Instructions:  Your physician recommends that you continue on your current medications as directed. Please refer to the Current Medication list given to you today.  *If you  need a refill on your cardiac medications before your next appointment, please call your pharmacy*   Follow-Up: At [facility], you and your health needs are our priority.  As part of our continuing mission to provide you with exceptional heart care, we have created designated Provider Care Teams.  These Care Teams include your primary Cardiologist (physician) and Advanced Practice Providers (APPs -  Physician Assistants and Nurse Practitioners) who all work together to provide you with the care you need, when you need it.  Your next appointment:   6 month(s)  Provider:   Epifanio Lesches, MD         Signed, Little Ishikawa, MD  09/02/2023 4:39 PM    [facility] apnea you

## 2023-09-02 ENCOUNTER — Ambulatory Visit: Payer: Medicare HMO | Attending: Cardiology | Admitting: Cardiology

## 2023-09-02 ENCOUNTER — Encounter: Payer: Self-pay | Admitting: Cardiology

## 2023-09-02 VITALS — BP 112/62 | HR 73 | Ht 67.0 in | Wt 280.0 lb

## 2023-09-02 DIAGNOSIS — I77819 Aortic ectasia, unspecified site: Secondary | ICD-10-CM

## 2023-09-02 DIAGNOSIS — E785 Hyperlipidemia, unspecified: Secondary | ICD-10-CM

## 2023-09-02 DIAGNOSIS — I493 Ventricular premature depolarization: Secondary | ICD-10-CM

## 2023-09-02 DIAGNOSIS — R55 Syncope and collapse: Secondary | ICD-10-CM

## 2023-09-02 DIAGNOSIS — I10 Essential (primary) hypertension: Secondary | ICD-10-CM | POA: Diagnosis not present

## 2023-09-02 DIAGNOSIS — I491 Atrial premature depolarization: Secondary | ICD-10-CM

## 2023-09-02 NOTE — Patient Instructions (Signed)
Medication Instructions:  Your physician recommends that you continue on your current medications as directed. Please refer to the Current Medication list given to you today.  *If you need a refill on your cardiac medications before your next appointment, please call your pharmacy*   Follow-Up: At [facility], you and your health needs are our priority.  As part of our continuing mission to provide you with exceptional heart care, we have created designated Provider Care Teams.  These Care Teams include your primary Cardiologist (physician) and Advanced Practice Providers (APPs -  Physician Assistants and Nurse Practitioners) who all work together to provide you with the care you need, when you need it.  Your next appointment:   6 month(s)  Provider:   Epifanio Lesches, MD

## 2023-09-13 ENCOUNTER — Encounter (INDEPENDENT_AMBULATORY_CARE_PROVIDER_SITE_OTHER): Payer: Self-pay

## 2023-09-13 ENCOUNTER — Telehealth (INDEPENDENT_AMBULATORY_CARE_PROVIDER_SITE_OTHER): Payer: Self-pay | Admitting: Adult Health

## 2023-09-13 NOTE — Telephone Encounter (Signed)
 Pt is calling in stating that she is having some issues with her medication Ozepmic she is have some stomach issue and didn't know if she should stop it or make an appointment to come in to see [facility].

## 2023-09-15 ENCOUNTER — Telehealth: Payer: Self-pay

## 2023-09-15 NOTE — Telephone Encounter (Signed)
 Called pt left vm to schedule orthotic pick up

## 2023-09-16 ENCOUNTER — Ambulatory Visit: Payer: Medicare HMO

## 2023-09-16 NOTE — Progress Notes (Signed)
 Patient was here for remake fitting  Patient states that orthotics feel better and will try I added 1/4" lift to Right heel to accommodate shorter right LE  Patient will FU as needed  Addison Bailey CPed, CFo, CFm

## 2023-09-23 ENCOUNTER — Other Ambulatory Visit: Payer: Self-pay | Admitting: Internal Medicine

## 2023-09-28 ENCOUNTER — Encounter (INDEPENDENT_AMBULATORY_CARE_PROVIDER_SITE_OTHER): Payer: Self-pay | Admitting: Adult Health

## 2023-09-28 ENCOUNTER — Ambulatory Visit (INDEPENDENT_AMBULATORY_CARE_PROVIDER_SITE_OTHER): Payer: Medicare HMO | Admitting: Adult Health

## 2023-09-28 VITALS — BP 129/78 | HR 73 | Temp 98.0°F | Ht 67.0 in | Wt 268.0 lb

## 2023-09-28 DIAGNOSIS — I152 Hypertension secondary to endocrine disorders: Secondary | ICD-10-CM | POA: Diagnosis not present

## 2023-09-28 DIAGNOSIS — E559 Vitamin D deficiency, unspecified: Secondary | ICD-10-CM | POA: Diagnosis not present

## 2023-09-28 DIAGNOSIS — E1165 Type 2 diabetes mellitus with hyperglycemia: Secondary | ICD-10-CM | POA: Diagnosis not present

## 2023-09-28 DIAGNOSIS — E66812 Obesity, class 2: Secondary | ICD-10-CM

## 2023-09-28 DIAGNOSIS — E669 Obesity, unspecified: Secondary | ICD-10-CM

## 2023-09-28 DIAGNOSIS — Z6841 Body Mass Index (BMI) 40.0 and over, adult: Secondary | ICD-10-CM

## 2023-09-28 DIAGNOSIS — Z7985 Long-term (current) use of injectable non-insulin antidiabetic drugs: Secondary | ICD-10-CM | POA: Diagnosis not present

## 2023-09-28 DIAGNOSIS — E1159 Type 2 diabetes mellitus with other circulatory complications: Secondary | ICD-10-CM

## 2023-09-28 DIAGNOSIS — Z7984 Long term (current) use of oral hypoglycemic drugs: Secondary | ICD-10-CM | POA: Diagnosis not present

## 2023-09-28 MED ORDER — OZEMPIC (0.25 OR 0.5 MG/DOSE) 2 MG/3ML ~~LOC~~ SOPN: 0.5000 mg | PEN_INJECTOR | SUBCUTANEOUS | 0 refills | Status: DC

## 2023-09-28 NOTE — Progress Notes (Signed)
WEIGHT SUMMARY AND BIOMETRICS  Vitals Temp: 98 F (36.7 C) BP: 129/78 Pulse Rate: 73 SpO2: 96 %   Anthropometric Measurements Height: 5\' 7"  (1.702 m) Weight: 268 lb (121.6 kg) BMI (Calculated): 41.96 Weight at Last Visit: 276lb Weight Lost Since Last Visit: 8lb Weight Gained Since Last Visit: 0 Starting Weight: 278lb Total Weight Loss (lbs): 12 lb (5.443 kg) Peak Weight: 295lb   Body Composition  Body Fat %: 50.7 % Fat Mass (lbs): 136 lbs Muscle Mass (lbs): 125.8 lbs Total Body Water (lbs): 95.2 lbs Visceral Fat Rating : 18   Other Clinical Data Fasting: yes Labs: no Today's Visit #: 19 Starting Date: 02/14/20    Chief Complaint:   OBESITY Mckenzie Hebert is here to discuss her progress with her obesity treatment plan. She is on the the Category 4 Plan and states she is following her eating plan approximately 75 % of the time.  She states she is exercising House/Yard work and walking dogs 10-15  minutes 3 times per week.   Interim History:  She restarted Ozempic 0.25mg  on/about 07/19/2023 She had 3 doses of Ozempic 0.25mg  She started 0.5mg  strength 08/16/2023 Has remained on the lowest mx dose of 0.5mg  for > 8 weeks.  Reviewed Bioimpedance results with pt: Muscle Mass: -0.4 lb Adipose Mass: -7 lbs  Subjective:   1. Hypertension associated with type 2 diabetes mellitus (HCC) BP stable at OV She is on daily Cozaar 25mg  and Toprol XL 25mg   2. Type 2 diabetes mellitus with hyperglycemia, without long-term current use of insulin (HCC) She restarted Ozempic 0.25mg  on/about 07/19/2023 She had 3 doses of Ozempic 0.25mg  She started 0.5mg  strength 08/16/2023 Has remained on the lowest mx dose of 0.5mg  for > 8 weeks. She has remained on Metformin 500mg  BID Denies mass in neck, dysphagia, dyspepsia, persistent hoarseness, abdominal pain, or N/V, worsening constipation. She uses OTC Colace and Miralax- will experience a BM every 3-4 days. She denies  hematochezia She reports all fasting CBG <130 the last 3-4 weeks  3. Vitamin D deficiency  Latest Reference Range & Units 09/08/22 09:39  Vitamin D, 25-Hydroxy 30.0 - 100.0 ng/mL 30.0    4. Hyperlipidemia associated with type 2 diabetes mellitus (HCC) Lipid Panel     Component Value Date/Time   CHOL 109 04/08/2023 0923   CHOL 132 07/04/2021 1609   TRIG 104 04/08/2023 0923   HDL 50 04/08/2023 0923   HDL 63 07/04/2021 1609   CHOLHDL 2.2 04/08/2023 0923   VLDL 17 05/12/2016 0933   LDLCALC 40 04/08/2023 0923   LABVLDL 16 07/04/2021 1609     5. Hypothyroidism, unspecified type She is on daily synthroid 150 mcg   Assessment/Plan:   1. Hypertension associated with type 2 diabetes mellitus (HCC) Check Labs - Comprehensive metabolic panel  2. Type 2 diabetes mellitus with hyperglycemia, without long-term current use of insulin (HCC) Check Labs and Refill - Hemoglobin A1c - Insulin, random - Vitamin B12 Semaglutide,0.25 or 0.5MG /DOS, (OZEMPIC, 0.25 OR 0.5 MG/DOSE,) 2 MG/3ML SOPN Inject 0.5 mg as directed once a week. Dispense: 3 mL, Refills: 0 ordered   3. Vitamin D deficiency Check Labs - VITAMIN D 25 Hydroxy (Vit-D Deficiency, Fractures)  4. Obesity, current BMI 41.96 (Primary)  Mckenzie Hebert is currently in the action stage of change. As such, her goal is to continue with weight loss efforts. She has agreed to the Category 4 Plan.   Exercise goals: Older adults should follow the adult guidelines. When older adults cannot  meet the adult guidelines, they should be as physically active as their abilities and conditions will allow.  Older adults should do exercises that maintain or improve balance if they are at risk of falling.  Older adults should determine their level of effort for physical activity relative to their level of fitness.  Older adults with chronic conditions should understand whether and how their conditions affect their ability to do regular physical activity  safely.  Behavioral modification strategies: increasing lean protein intake, decreasing simple carbohydrates, increasing vegetables, increasing water intake, no skipping meals, meal planning and cooking strategies, keeping healthy foods in the home, ways to avoid boredom eating, and planning for success.  [name] has agreed to follow-up with our clinic in 4 weeks. She was informed of the importance of frequent follow-up visits to maximize her success with intensive lifestyle modifications for her multiple health conditions.   [name] was informed we would discuss her lab results at her next visit unless there is a critical issue that needs to be addressed sooner. [name] agreed to keep her next visit at the agreed upon time to discuss these results.  Objective:   Blood pressure 129/78, pulse 73, temperature 98 F (36.7 C), height 5\' 7"  (1.702 m), weight 268 lb (121.6 kg), last menstrual period 03/18/2008, SpO2 96%. Body mass index is 41.97 kg/m.  General: Cooperative, alert, well developed, in no acute distress. HEENT: Conjunctivae and lids unremarkable. Cardiovascular: Regular rhythm.  Lungs: Normal work of breathing. Neurologic: No focal deficits.   Lab Results  Component Value Date   CREATININE 0.65 04/08/2023   BUN 11 04/08/2023   NA 138 04/08/2023   K 4.4 04/08/2023   CL 103 04/08/2023   CO2 27 04/08/2023   Lab Results  Component Value Date   ALT 20 04/08/2023   AST 19 04/08/2023   ALKPHOS 71 09/03/2022   BILITOT 0.6 04/08/2023   Lab Results  Component Value Date   HGBA1C 7.3 (H) 07/19/2023   HGBA1C 6.8 (H) 04/08/2023   HGBA1C 6.8 (H) 11/05/2022   HGBA1C 7.1 (H) 09/08/2022   HGBA1C 6.1 (H) 05/05/2022   Lab Results  Component Value Date   INSULIN 26.0 (H) 09/08/2022   INSULIN 9.7 09/17/2021   INSULIN 20.7 02/14/2020   Lab Results  Component Value Date   TSH 2.72 04/08/2023   Lab Results  Component Value Date   CHOL 109 04/08/2023   HDL 50 04/08/2023   LDLCALC  40 04/08/2023   TRIG 104 04/08/2023   CHOLHDL 2.2 04/08/2023   Lab Results  Component Value Date   VD25OH 30.0 09/08/2022   VD25OH 42 01/29/2022   VD25OH 31 07/29/2021   Lab Results  Component Value Date   WBC 5.9 04/08/2023   HGB 12.6 04/08/2023   HCT 38.5 04/08/2023   MCV 82.3 04/08/2023   PLT 243 04/08/2023   Lab Results  Component Value Date   IRON 60 01/14/2018   TIBC 398 01/14/2018   FERRITIN 22 01/14/2018   Attestation Statements:   Reviewed by clinician on day of visit: allergies, medications, problem list, medical history, surgical history, family history, social history, and previous encounter notes.  I have reviewed the above documentation for accuracy and completeness, and I agree with the above. -  Stehanie Ekstrom d. Yasin Ducat, NP-C

## 2023-09-29 LAB — COMPREHENSIVE METABOLIC PANEL
ALT: 25 [IU]/L (ref 0–32)
AST: 21 [IU]/L (ref 0–40)
Albumin: 4.3 g/dL (ref 3.9–4.9)
Alkaline Phosphatase: 62 [IU]/L (ref 44–121)
BUN/Creatinine Ratio: 19 (ref 12–28)
BUN: 13 mg/dL (ref 8–27)
Bilirubin Total: 0.4 mg/dL (ref 0.0–1.2)
CO2: 22 mmol/L (ref 20–29)
Calcium: 9 mg/dL (ref 8.7–10.3)
Chloride: 101 mmol/L (ref 96–106)
Creatinine, Ser: 0.69 mg/dL (ref 0.57–1.00)
Globulin, Total: 2.3 g/dL (ref 1.5–4.5)
Glucose: 109 mg/dL — ABNORMAL HIGH (ref 70–99)
Potassium: 4.3 mmol/L (ref 3.5–5.2)
Sodium: 139 mmol/L (ref 134–144)
Total Protein: 6.6 g/dL (ref 6.0–8.5)
eGFR: 94 mL/min/{1.73_m2} (ref >59)

## 2023-09-29 LAB — VITAMIN B12: Vitamin B-12: 476 pg/mL (ref 232–1245)

## 2023-09-29 LAB — HEMOGLOBIN A1C
Est. average glucose Bld gHb Est-mCnc: 137 mg/dL
Hgb A1c MFr Bld: 6.4 % — ABNORMAL HIGH (ref 4.8–5.6)

## 2023-09-29 LAB — VITAMIN D 25 HYDROXY (VIT D DEFICIENCY, FRACTURES): Vit D, 25-Hydroxy: 58.3 ng/mL (ref 30.0–100.0)

## 2023-09-29 LAB — INSULIN, RANDOM: INSULIN: 19.2 u[IU]/mL (ref 2.6–24.9)

## 2023-10-12 NOTE — Progress Notes (Incomplete)
Patient Care Team: Margaree Mackintosh, MD as PCP - General (Internal Medicine)  Visit Date: 10/12/23  Subjective:  No chief complaint on file.  BP Readings from Last 1 Encounters:  09/28/23 129/78   Patient ZO:XWRUE Mckenzie Hebert, Mckenzie Hebert DOB:06-21-1956,68 y.o. AVW:[id number]   68 y.o. Female presents today for 3 months follow-up for DM II, HTN, HLD, Hypothyroidism, and Anxiety. Last seen 07/20/23 for 3 month f/u of chronic disease management, in the interim she has seen William Hamburger, NP for vitamin-d deficiency, htn, dm II, and obesity x3 (most recently 09/28/23), Epifanio Lesches with Cardiology, and Addison Bailey, CMA for orthotic refitting.   History of Hypertension treated with 25 mg Metoprolol succinate daily and 25 mg Losartan daily. {Blood Pressure:32095::"normotensive"} today at ***/***  History of Hyperlipidemia treated with 10 mg Rosuvastatin daily and 10 mg Zetia daily.   History of Diabetes Mellitus, type II treated with 500 mg Metformin BID and 0.5 mg Ozempic injected weekly. 09/28/23 HgbA1c 6.4, decreased from 7.5 on 07/19/23; Blood Glucose 109, decreased from 130 on 04/08/23; Random Insulin wnl at 19.2.   History of Hypothyroidism treated with 150 mcg Levothyroxine  History of Anxiety treated with 1 mg Xanax nightly.   History of Vitamin-D Deficiency with 09/28/23 Vitamin-D 25-Hydroxy 58.3.   Past Medical History:  Diagnosis Date   AC (acromioclavicular) joint bone spurs 2008   right foot   Anxiety    Asthma    Back pain    Bilateral swelling of feet    Constipation    Dyspnea    Dysrhythmia 2019   PVC   Endometrial polyp    Heartburn    History of colon polyps    Hypothyroidism    Joint pain    Migraines    Dr Clarisse Gouge   Osteoarthritis    PAC (premature atrial contraction)    Palpitations    cardiologist-  dr hochrein-- normal myoview 2017, holter 2015 mild ectopy   PONV (postoperative nausea and vomiting)    Pre-diabetes 2019   PVC (premature ventricular  contraction)    SOB (shortness of breath)    Thyroid disease    Uterine fibroid    Vitamin D deficiency     Allergies  Allergen Reactions   Percocet [Oxycodone-Acetaminophen] Nausea Only    Family History  Problem Relation Age of Onset   Diabetes Mother    Hypertension Mother    Heart disease Mother 53       CABG   Stroke Mother    Hyperlipidemia Mother    Cancer Father        lung cancer   Depression Father    Alcohol abuse Father    Diabetes Sister    Hypertension Sister    Thyroid disease Sister        hyperthyroidism   Breast cancer Paternal Aunt    Hypertension Sister    Autoimmune disease Sister    Leukemia Paternal Grandmother    Mental retardation Other    Social History   Social History Narrative   Lives alone.     ROS   Objective:  Vitals: LMP 03/18/2008 (LMP Unknown)   Physical Exam  Results:  Studies Obtained And Personally Reviewed By Me:  Imaging, colonoscopy, mammogram, bone density scan, echocardiogram, heart cath, stress test, CT calcium score, etc. ***  Labs:     Component Value Date/Time   NA 139 09/28/2023 1016   K 4.3 09/28/2023 1016   CL 101 09/28/2023 1016   CO2 22 09/28/2023 1016  GLUCOSE 109 (H) 09/28/2023 1016   GLUCOSE 130 (H) 04/08/2023 0923   BUN 13 09/28/2023 1016   CREATININE 0.69 09/28/2023 1016   CREATININE 0.65 04/08/2023 0923   CALCIUM 9.0 09/28/2023 1016   PROT 6.6 09/28/2023 1016   ALBUMIN 4.3 09/28/2023 1016   AST 21 09/28/2023 1016   ALT 25 09/28/2023 1016   ALKPHOS 62 09/28/2023 1016   BILITOT 0.4 09/28/2023 1016   GFRNONAA 94 01/28/2021 0919   GFRAA 109 01/28/2021 0919    Lab Results  Component Value Date   WBC 5.9 04/08/2023   HGB 12.6 04/08/2023   HCT 38.5 04/08/2023   MCV 82.3 04/08/2023   PLT 243 04/08/2023   Lab Results  Component Value Date   CHOL 109 04/08/2023   HDL 50 04/08/2023   LDLCALC 40 04/08/2023   TRIG 104 04/08/2023   CHOLHDL 2.2 04/08/2023   Lab Results  Component Value  Date   HGBA1C 6.4 (H) 09/28/2023    Lab Results  Component Value Date   TSH 2.72 04/08/2023    {PSA (Optional):32132} Results for orders placed or performed in visit on 09/28/23  Comprehensive metabolic panel   Collection Time: 09/28/23 10:16 AM  Result Value Ref Range   Glucose 109 (H) 70 - 99 mg/dL   BUN 13 8 - 27 mg/dL   Creatinine, Ser 4.78 0.57 - 1.00 mg/dL   eGFR 94 >29 FA/OZH/0.86   BUN/Creatinine Ratio 19 12 - 28   Sodium 139 134 - 144 mmol/L   Potassium 4.3 3.5 - 5.2 mmol/L   Chloride 101 96 - 106 mmol/L   CO2 22 20 - 29 mmol/L   Calcium 9.0 8.7 - 10.3 mg/dL   Total Protein 6.6 6.0 - 8.5 g/dL   Albumin 4.3 3.9 - 4.9 g/dL   Globulin, Total 2.3 1.5 - 4.5 g/dL   Bilirubin Total 0.4 0.0 - 1.2 mg/dL   Alkaline Phosphatase 62 44 - 121 IU/L   AST 21 0 - 40 IU/L   ALT 25 0 - 32 IU/L  Hemoglobin A1c   Collection Time: 09/28/23 10:16 AM  Result Value Ref Range   Hgb A1c MFr Bld 6.4 (H) 4.8 - 5.6 %   Est. average glucose Bld gHb Est-mCnc 137 mg/dL  Insulin, random   Collection Time: 09/28/23 10:16 AM  Result Value Ref Range   INSULIN 19.2 2.6 - 24.9 uIU/mL  VITAMIN D 25 Hydroxy (Vit-D Deficiency, Fractures)   Collection Time: 09/28/23 10:16 AM  Result Value Ref Range   Vit D, 25-Hydroxy 58.3 30.0 - 100.0 ng/mL  Vitamin B12   Collection Time: 09/28/23 10:16 AM  Result Value Ref Range   Vitamin B-12 476 232 - 1,245 pg/mL   Assessment & Plan:   ***    I,Emily Lagle,acting as a scribe for Margaree Mackintosh, MD.,have documented all relevant documentation on the behalf of Margaree Mackintosh, MD,as directed by  Margaree Mackintosh, MD while in the presence of Margaree Mackintosh, MD.   ***

## 2023-10-13 ENCOUNTER — Other Ambulatory Visit (INDEPENDENT_AMBULATORY_CARE_PROVIDER_SITE_OTHER): Payer: Self-pay | Admitting: Adult Health

## 2023-10-13 DIAGNOSIS — E1165 Type 2 diabetes mellitus with hyperglycemia: Secondary | ICD-10-CM

## 2023-10-14 ENCOUNTER — Other Ambulatory Visit: Payer: Medicare HMO

## 2023-10-14 NOTE — Addendum Note (Signed)
 Addended by: Truc Winfree P on: 10/14/2023 01:10 PM   Modules accepted: Orders

## 2023-10-15 ENCOUNTER — Ambulatory Visit: Payer: Medicare HMO | Admitting: Internal Medicine

## 2023-10-15 ENCOUNTER — Other Ambulatory Visit: Payer: Medicare HMO

## 2023-10-15 DIAGNOSIS — E785 Hyperlipidemia, unspecified: Secondary | ICD-10-CM | POA: Diagnosis not present

## 2023-10-15 DIAGNOSIS — E039 Hypothyroidism, unspecified: Secondary | ICD-10-CM

## 2023-10-15 DIAGNOSIS — E1169 Type 2 diabetes mellitus with other specified complication: Secondary | ICD-10-CM | POA: Diagnosis not present

## 2023-10-16 LAB — LIPID PANEL
Cholesterol: 107 mg/dL (ref <200)
HDL: 52 mg/dL (ref >=50)
LDL Cholesterol (Calc): 38 mg/dL
Non-HDL Cholesterol (Calc): 55 mg/dL (ref <130)
Total CHOL/HDL Ratio: 2.1 (calc) (ref <5.0)
Triglycerides: 88 mg/dL (ref <150)

## 2023-10-16 LAB — TSH: TSH: 2.63 m[IU]/L (ref 0.40–4.50)

## 2023-10-19 ENCOUNTER — Encounter: Payer: Self-pay | Admitting: Internal Medicine

## 2023-10-19 ENCOUNTER — Ambulatory Visit (INDEPENDENT_AMBULATORY_CARE_PROVIDER_SITE_OTHER): Payer: Medicare HMO | Admitting: Internal Medicine

## 2023-10-19 VITALS — BP 120/82 | Ht 67.0 in | Wt 272.0 lb

## 2023-10-19 DIAGNOSIS — E1165 Type 2 diabetes mellitus with hyperglycemia: Secondary | ICD-10-CM | POA: Diagnosis not present

## 2023-10-19 DIAGNOSIS — E1159 Type 2 diabetes mellitus with other circulatory complications: Secondary | ICD-10-CM | POA: Diagnosis not present

## 2023-10-19 DIAGNOSIS — E785 Hyperlipidemia, unspecified: Secondary | ICD-10-CM

## 2023-10-19 DIAGNOSIS — E1169 Type 2 diabetes mellitus with other specified complication: Secondary | ICD-10-CM | POA: Diagnosis not present

## 2023-10-19 DIAGNOSIS — E039 Hypothyroidism, unspecified: Secondary | ICD-10-CM | POA: Diagnosis not present

## 2023-10-19 DIAGNOSIS — I152 Hypertension secondary to endocrine disorders: Secondary | ICD-10-CM | POA: Diagnosis not present

## 2023-10-19 NOTE — Progress Notes (Signed)
 Patient Care Team: Margaree Mackintosh, MD as PCP - General (Internal Medicine)  Visit Date: 10/19/23  Subjective:   Chief Complaint  Patient presents with  . Hypothyroidism  Patient Mckenzie Hebert, Mckenzie Hebert DOB:06/28/1956,67 y.o. WGN:[id number]   68 y.o. Female presents today for 3 months follow-up for DM II, HTN, HLD, Hypothyroidism, and Anxiety. Last seen 07/20/23 for 3 month f/u of chronic disease management, in the interim she has seen William Hamburger, NP with [facility] for vitamin-d deficiency, htn, dm II, and obesity x3 (most recently 09/28/23), Epifanio Lesches with Cardiology, and Addison Bailey, CMA for orthotic refitting.    History of Hypertension treated with 25 mg Metoprolol succinate daily and 25 mg Losartan daily. Blood Pressure: normotensive today at 120/82.    History of Hyperlipidemia treated with 10 mg Rosuvastatin daily and 10 mg Zetia daily.    History of Diabetes Mellitus, type II treated with 500 mg Metformin BID and 0.5 mg Ozempic injected weekly. 09/28/23 HgbA1c 6.4, decreased from 7.5 on 07/19/23; Blood Glucose 109, decreased from 130 on 04/08/23; Random Insulin wnl at 19.2. She reports that she has started looking into alternative ways to continue Ozempic, as it will no longer be covered under her insurance. Notes that someone suggested looking into receiving prescriptions from [place] pharmac[facility] for cheaper self-pay pricing.   History of Hypothyroidism treated with 150 mcg Levothyroxine. 10/15/23 TSH 2.62.    History of Anxiety treated with 1 mg Xanax nightly.    History of Vitamin-D Deficiency with Drisdol 50,000 units weekly. 09/28/23 Vitamin-D 25-Hydroxy 58.3.   History of Asthma treated with Albuterol inhaler as needed.  Past Medical History:  Diagnosis Date  . AC (acromioclavicular) joint bone spurs 2008   right foot  . Anxiety   . Asthma   . Back pain   . Bilateral swelling of feet   . Constipation   . Dyspnea   . Dysrhythmia 2019    PVC  . Endometrial polyp   . Heartburn   . History of colon polyps   . Hypothyroidism   . Joint pain   . Migraines    Dr Clarisse Gouge  . Osteoarthritis   . PAC (premature atrial contraction)   . Palpitations    cardiologist-  dr hochrein-- normal myoview 2017, holter 2015 mild ectopy  . PONV (postoperative nausea and vomiting)   . Pre-diabetes 2019  . PVC (premature ventricular contraction)   . SOB (shortness of breath)   . Thyroid disease   . Uterine fibroid   . Vitamin D deficiency     Allergies  Allergen Reactions  . Percocet [Oxycodone-Acetaminophen] Nausea Only    Family History  Problem Relation Age of Onset  . Diabetes Mother   . Hypertension Mother   . Heart disease Mother 43       CABG  . Stroke Mother   . Hyperlipidemia Mother   . Cancer Father        lung cancer  . Depression Father   . Alcohol abuse Father   . Diabetes Sister   . Hypertension Sister   . Thyroid disease Sister        hyperthyroidism  . Breast cancer Paternal Aunt   . Hypertension Sister   . Autoimmune disease Sister   . Leukemia Paternal Grandmother   . Mental retardation Other    Social History   Social History Narrative   Lives alone.    Has been going to wellness center when possible to work on exercise. Mentions  spending her free-time painting, napping, and listening to audio books. Also states that she has been helping take care of a friend/neighbor of hers, though they also go to a day center Mondays, Tuesday, and Fridays.    Review of Systems  Constitutional:  Negative for fever and malaise/fatigue.  HENT:  Negative for congestion.   Eyes:  Negative for blurred vision.  Respiratory:  Negative for cough and shortness of breath.   Cardiovascular:  Negative for chest pain, palpitations and leg swelling.  Gastrointestinal:  Negative for vomiting.  Musculoskeletal:  Negative for back pain.  Skin:  Negative for rash.  Neurological:  Negative for loss of consciousness and headaches.      Objective:  Vitals: BP 120/82   Ht 5\' 7"  (1.702 m)   Wt 272 lb (123.4 kg)   LMP 03/18/2008 (LMP Unknown)   SpO2 96%   BMI 42.60 kg/m   Physical Exam Vitals and nursing note reviewed.  Constitutional:      General: She is not in acute distress.    Appearance: Normal appearance. She is not toxic-appearing.  HENT:     Head: Normocephalic and atraumatic.  Pulmonary:     Effort: Pulmonary effort is normal.  Skin:    General: Skin is warm and dry.  Neurological:     Mental Status: She is alert and oriented to person, place, and time. Mental status is at baseline.  Psychiatric:        Mood and Affect: Mood normal.        Behavior: Behavior normal.        Thought Content: Thought content normal.        Judgment: Judgment normal.    Results:  Studies Obtained And Personally Reviewed By Me: Labs:     Component Value Date/Time   NA 139 09/28/2023 1016   K 4.3 09/28/2023 1016   CL 101 09/28/2023 1016   CO2 22 09/28/2023 1016   GLUCOSE 109 (H) 09/28/2023 1016   GLUCOSE 130 (H) 04/08/2023 0923   BUN 13 09/28/2023 1016   CREATININE 0.69 09/28/2023 1016   CREATININE 0.65 04/08/2023 0923   CALCIUM 9.0 09/28/2023 1016   PROT 6.6 09/28/2023 1016   ALBUMIN 4.3 09/28/2023 1016   AST 21 09/28/2023 1016   ALT 25 09/28/2023 1016   ALKPHOS 62 09/28/2023 1016   BILITOT 0.4 09/28/2023 1016   GFRNONAA 94 01/28/2021 0919   GFRAA 109 01/28/2021 0919    Lab Results  Component Value Date   WBC 5.9 04/08/2023   HGB 12.6 04/08/2023   HCT 38.5 04/08/2023   MCV 82.3 04/08/2023   PLT 243 04/08/2023   Lab Results  Component Value Date   CHOL 107 10/15/2023   HDL 52 10/15/2023   LDLCALC 38 10/15/2023   TRIG 88 10/15/2023   CHOLHDL 2.1 10/15/2023   Lab Results  Component Value Date   HGBA1C 6.4 (H) 09/28/2023    Lab Results  Component Value Date   TSH 2.63 10/15/2023   Assessment & Plan:   Hypertension treated with 25 mg Metoprolol succinate daily and 25 mg Losartan daily.  Blood Pressure: normotensive today at 120/82.    Hyperlipidemia treated with 10 mg Rosuvastatin daily and 10 mg Zetia daily.    Diabetes Mellitus, type II treated with 500 mg Metformin BID and 0.5 mg Ozempic injected weekly. 09/28/23 HgbA1c 6.4, decreased from 7.5 on 07/19/23; Blood Glucose 109, decreased from 130 on 04/08/23; Random Insulin wnl at 19.2. She has started looking into  alternative ways to continue Ozempic, as it will no longer be covered under her insurance.    Hypothyroidism treated with 150 mcg Levothyroxine. 10/15/23 TSH 2.62.    Anxiety treated with 1 mg Xanax nightly.    Vitamin-D Deficiency with Drisdol 50,000 units weekly. 09/28/23 Vitamin-D 25-Hydroxy 58.3.   Asthma treated with Albuterol inhaler as needed.    I,Emily Lagle,acting as a Neurosurgeon for Margaree Mackintosh, MD.,have documented all relevant documentation on the behalf of Margaree Mackintosh, MD,as directed by  Margaree Mackintosh, MD while in the presence of Margaree Mackintosh, MD.   ***

## 2023-10-26 ENCOUNTER — Encounter (INDEPENDENT_AMBULATORY_CARE_PROVIDER_SITE_OTHER): Payer: Self-pay | Admitting: Adult Health

## 2023-10-26 ENCOUNTER — Ambulatory Visit (INDEPENDENT_AMBULATORY_CARE_PROVIDER_SITE_OTHER): Payer: Medicare HMO | Admitting: Adult Health

## 2023-10-26 VITALS — BP 111/73 | HR 85 | Temp 98.8°F | Ht 67.0 in | Wt 267.0 lb

## 2023-10-26 DIAGNOSIS — E1159 Type 2 diabetes mellitus with other circulatory complications: Secondary | ICD-10-CM

## 2023-10-26 DIAGNOSIS — I152 Hypertension secondary to endocrine disorders: Secondary | ICD-10-CM

## 2023-10-26 DIAGNOSIS — E1165 Type 2 diabetes mellitus with hyperglycemia: Secondary | ICD-10-CM

## 2023-10-26 DIAGNOSIS — E1169 Type 2 diabetes mellitus with other specified complication: Secondary | ICD-10-CM

## 2023-10-26 DIAGNOSIS — E66812 Obesity, class 2: Secondary | ICD-10-CM

## 2023-10-26 DIAGNOSIS — Z7984 Long term (current) use of oral hypoglycemic drugs: Secondary | ICD-10-CM | POA: Diagnosis not present

## 2023-10-26 DIAGNOSIS — E785 Hyperlipidemia, unspecified: Secondary | ICD-10-CM | POA: Diagnosis not present

## 2023-10-26 DIAGNOSIS — E559 Vitamin D deficiency, unspecified: Secondary | ICD-10-CM

## 2023-10-26 DIAGNOSIS — Z6839 Body mass index (BMI) 39.0-39.9, adult: Secondary | ICD-10-CM | POA: Diagnosis not present

## 2023-10-26 DIAGNOSIS — E669 Obesity, unspecified: Secondary | ICD-10-CM | POA: Diagnosis not present

## 2023-10-26 MED ORDER — VITAMIN D (ERGOCALCIFEROL) 1.25 MG (50000 UNIT) PO CAPS
50000.0000 [IU] | ORAL_CAPSULE | ORAL | 0 refills | Status: DC
Start: 2023-10-26 — End: 2024-02-02

## 2023-10-26 NOTE — Progress Notes (Signed)
WEIGHT SUMMARY AND BIOMETRICS  Vitals Temp: 98.8 F (37.1 C) BP: 111/73 Pulse Rate: 85 SpO2: 96 %   Anthropometric Measurements Height: 5\' 7"  (1.702 m) Weight: 267 lb (121.1 kg) BMI (Calculated): 41.81 Weight at Last Visit: 268 lb Weight Lost Since Last Visit: 1 lb Weight Gained Since Last Visit: 0 Starting Weight: 278 lb Total Weight Loss (lbs): 13 lb (5.897 kg) Peak Weight: 295 lb   Body Composition  Body Fat %: 51 % Fat Mass (lbs): 136.4 lbs Muscle Mass (lbs): 124.6 lbs Total Body Water (lbs): 95.6 lbs Visceral Fat Rating : 18   Other Clinical Data Fasting: no Labs: no Today's Visit #: 20 Starting Date: 02/14/20    Chief Complaint:   OBESITY Mckenzie Hebert is here to discuss her progress with her obesity treatment plan.  She is on the the Category 4 Plan and states she is following her eating plan approximately 75 % of the time.  She states she is exercising Walking 15 minutes 4 times per week.   Interim History:  Mckenzie Hebert reports stable mood and enjoys time with her two dogs. She has been trying to increase protein at meals and snacks.  Exercise-NEAT Activities and walking her dogs  Subjective:   1. Hyperlipidemia associated with type 2 diabetes mellitus [facility]) Discussed Labs Lipid Panel     Component Value Date/Time   CHOL 107 10/15/2023 0929   CHOL 132 07/04/2021 1609   TRIG 88 10/15/2023 0929   HDL 52 10/15/2023 0929   HDL 63 07/04/2021 1609   CHOLHDL 2.1 10/15/2023 0929   VLDL 17 05/12/2016 0933   LDLCALC 38 10/15/2023 0929   LABVLDL 16 07/04/2021 1609   The ASCVD Risk score (Arnett DK, et al., 2019) failed to calculate for the following reasons:   The valid total cholesterol range is 130 to 320 mg/dL   Lipid panel stable LDL at goal for a diabetic PCP manages Zetia 10mg  and Cards manages Crestor 10mg   2. Type 2 diabetes mellitus with hyperglycemia, without long-term current use of insulin (HCC) Discussed Labs Lab Results   Component Value Date   HGBA1C 6.4 (H) 09/28/2023   HGBA1C 7.3 (H) 07/19/2023   HGBA1C 6.8 (H) 04/08/2023    Vitamin B12 232 - 1,245 pg/mL 476    Latest Reference Range & Units 09/28/23 10:16  Glucose 70 - 99 mg/dL 161 (H)  Hemoglobin W9U 4.8 - 5.6 % 6.4 (H)  Est. average glucose Bld gHb Est-mCnc mg/dL 045  INSULIN 2.6 - 40.9 uIU/mL 19.2  (H): Data is abnormally high  A1c and Insulin levels both improved A1c is now at goal <7 B12 level stable  PCP manages Metformin 500mg  BID and HWW manages weekly Ozempic 0.5mg  Home fasting CBG <130, most readings 110s Denies mass in neck, dysphagia, dyspepsia, persistent hoarseness, abdominal pain, or N/V/worsening constipation She reports that monthly Ozempic therapy is >$200/month after insurance benefits are applied. Discussed [employer] Patient Assistance Program  3. Vitamin D deficiency Discussed Labs  Latest Reference Range & Units 09/28/23 10:16  Vitamin D, 25-Hydroxy 30.0 - 100.0 ng/mL 58.3       Vit D level at goal She is on weekly Ergocalciferol- denies N/V/Muscle Weakness  4. Hypertension associated with type 2 diabetes mellitus (HCC) Discussed Labs 09/28/2023: CMP-electrolytes and kidney/liver enzymes are stable  Assessment/Plan:   1. Hyperlipidemia associated with type 2 diabetes mellitus (HCC) (Primary) Limit saturated fat Continue to increase cardiovascular exercise Continue Zetia and Crestor  2. Type 2  diabetes mellitus with hyperglycemia, without long-term current use of insulin (HCC) Continue Metformin per PCP Complete [employer] Pt Assistance Program - started forms in clinic today.  3. Vitamin D deficiency Refill and DECREASE Vitamin D, Ergocalciferol, (DRISDOL) 1.25 MG (50000 UNIT) CAPS capsule Take 1 capsule (50,000 Units total) by mouth every 14 (fourteen) days. Dispense: 12 capsule, Refills: 0 ordered 10/26/2023 -- Mckenzie Hebert, Mckenzie Hebert 4. Hypertension associated with type 2 diabetes mellitus  (HCC) Continue daily Losartan 25mg  and daily Metoprolol 25mg  1.5 tabs daily  5. Obesity, Starting BMI 41.9  Mckenzie Hebert is currently in the action stage of change. As such, her goal is to continue with weight loss efforts. She has agreed to the Category 4 Plan.   Exercise goals: Older adults should follow the adult guidelines. When older adults cannot meet the adult guidelines, they should be as physically active as their abilities and conditions will allow.  Older adults should do exercises that maintain or improve balance if they are at risk of falling.  Older adults should determine their level of effort for physical activity relative to their level of fitness.  Older adults with chronic conditions should understand whether and how their conditions affect their ability to do regular physical activity safely.  Behavioral modification strategies: increasing lean protein intake, decreasing simple carbohydrates, increasing vegetables, increasing water intake, no skipping meals, meal planning and cooking strategies, keeping healthy foods in the home, ways to avoid boredom eating, and planning for success.  Mckenzie Hebert has agreed to follow-up with our clinic in 4 weeks. She was informed of the importance of frequent follow-up visits to maximize her success with intensive lifestyle modifications for her multiple health conditions.   Objective:   Blood pressure 111/73, pulse 85, temperature 98.8 F (37.1 C), height 5\' 7"  (1.702 m), weight 267 lb (121.1 kg), last menstrual period 03/18/2008, SpO2 96%. Body mass index is 41.82 kg/m.  General: Cooperative, alert, well developed, in no acute distress. HEENT: Conjunctivae and lids unremarkable. Cardiovascular: Regular rhythm.  Lungs: Normal work of breathing. Neurologic: No focal deficits.   Lab Results  Component Value Date   CREATININE 0.69 09/28/2023   BUN 13 09/28/2023   NA 139 09/28/2023   K 4.3 09/28/2023   CL 101 09/28/2023   CO2 22 09/28/2023    Lab Results  Component Value Date   ALT 25 09/28/2023   AST 21 09/28/2023   ALKPHOS 62 09/28/2023   BILITOT 0.4 09/28/2023   Lab Results  Component Value Date   HGBA1C 6.4 (H) 09/28/2023   HGBA1C 7.3 (H) 07/19/2023   HGBA1C 6.8 (H) 04/08/2023   HGBA1C 6.8 (H) 11/05/2022   HGBA1C 7.1 (H) 09/08/2022   Lab Results  Component Value Date   INSULIN 19.2 09/28/2023   INSULIN 26.0 (H) 09/08/2022   INSULIN 9.7 09/17/2021   INSULIN 20.7 02/14/2020   Lab Results  Component Value Date   TSH 2.63 10/15/2023   Lab Results  Component Value Date   CHOL 107 10/15/2023   HDL 52 10/15/2023   LDLCALC 38 10/15/2023   TRIG 88 10/15/2023   CHOLHDL 2.1 10/15/2023   Lab Results  Component Value Date   VD25OH 58.3 09/28/2023   VD25OH 30.0 09/08/2022   VD25OH 42 01/29/2022   Lab Results  Component Value Date   WBC 5.9 04/08/2023   HGB 12.6 04/08/2023   HCT 38.5 04/08/2023   MCV 82.3 04/08/2023   PLT 243 04/08/2023   Lab Results  Component Value Date  IRON 60 01/14/2018   TIBC 398 01/14/2018   FERRITIN 22 01/14/2018   Attestation Statements:   Reviewed by clinician on day of visit: allergies, medications, problem list, medical history, surgical history, family history, social history, and previous encounter notes.  I have reviewed the above documentation for accuracy and completeness, and I agree with the above. -  Delawrence Fridman d. Eddison Searls, Mckenzie Hebert-C

## 2023-10-31 ENCOUNTER — Other Ambulatory Visit: Payer: Self-pay | Admitting: Cardiology

## 2023-11-01 ENCOUNTER — Encounter: Payer: Self-pay | Admitting: Internal Medicine

## 2023-11-01 NOTE — Patient Instructions (Signed)
 It was a pleasure to see you today. Labs are stable and WNL. Continue same meds and return for wellness visit and physical exam in 6 months. Please advise if having trouble affording meds.

## 2023-11-05 ENCOUNTER — Ambulatory Visit
Admission: RE | Admit: 2023-11-05 | Discharge: 2023-11-05 | Disposition: A | Discharge status: Home / Self Care | Payer: Medicare HMO | Source: Ambulatory Visit | Attending: Obstetrics & Gynecology | Admitting: Obstetrics & Gynecology

## 2023-11-05 DIAGNOSIS — E2839 Other primary ovarian failure: Secondary | ICD-10-CM

## 2023-11-05 DIAGNOSIS — N958 Other specified menopausal and perimenopausal disorders: Secondary | ICD-10-CM | POA: Diagnosis not present

## 2023-11-05 DIAGNOSIS — M8588 Other specified disorders of bone density and structure, other site: Secondary | ICD-10-CM | POA: Diagnosis not present

## 2023-11-08 ENCOUNTER — Encounter (HOSPITAL_BASED_OUTPATIENT_CLINIC_OR_DEPARTMENT_OTHER): Payer: Self-pay | Admitting: Obstetrics & Gynecology

## 2023-11-11 ENCOUNTER — Ambulatory Visit (INDEPENDENT_AMBULATORY_CARE_PROVIDER_SITE_OTHER): Admitting: Adult Health

## 2023-11-11 ENCOUNTER — Encounter (INDEPENDENT_AMBULATORY_CARE_PROVIDER_SITE_OTHER): Payer: Self-pay | Admitting: Adult Health

## 2023-11-11 DIAGNOSIS — Z7984 Long term (current) use of oral hypoglycemic drugs: Secondary | ICD-10-CM

## 2023-11-11 DIAGNOSIS — E669 Obesity, unspecified: Secondary | ICD-10-CM

## 2023-11-11 DIAGNOSIS — E1159 Type 2 diabetes mellitus with other circulatory complications: Secondary | ICD-10-CM | POA: Diagnosis not present

## 2023-11-11 DIAGNOSIS — E1165 Type 2 diabetes mellitus with hyperglycemia: Secondary | ICD-10-CM | POA: Diagnosis not present

## 2023-11-11 DIAGNOSIS — Z6841 Body Mass Index (BMI) 40.0 and over, adult: Secondary | ICD-10-CM

## 2023-11-11 DIAGNOSIS — Z7985 Long-term (current) use of injectable non-insulin antidiabetic drugs: Secondary | ICD-10-CM

## 2023-11-11 DIAGNOSIS — E559 Vitamin D deficiency, unspecified: Secondary | ICD-10-CM | POA: Diagnosis not present

## 2023-11-11 DIAGNOSIS — I152 Hypertension secondary to endocrine disorders: Secondary | ICD-10-CM

## 2023-11-11 DIAGNOSIS — Z0000 Encounter for general adult medical examination without abnormal findings: Secondary | ICD-10-CM | POA: Insufficient documentation

## 2023-11-11 MED ORDER — SEMAGLUTIDE (1 MG/DOSE) 4 MG/3ML ~~LOC~~ SOPN: 1.0000 mg | PEN_INJECTOR | SUBCUTANEOUS | 0 refills | Status: DC

## 2023-11-11 NOTE — Progress Notes (Signed)
 WEIGHT SUMMARY AND BIOMETRICS  Vitals Temp: 98 F (36.7 C) BP: 115/72 Pulse Rate: 69 SpO2: 96 %   Anthropometric Measurements Height: 5\' 7"  (1.702 m) Weight: 270 lb (122.5 kg) BMI (Calculated): 42.28 Weight at Last Visit: 267 LB Weight Lost Since Last Visit: 0 Weight Gained Since Last Visit: 3 lb Starting Weight: 278 LB Total Weight Loss (lbs): 10 lb (4.536 kg) Peak Weight: 295 LB   Body Composition  Body Fat %: 51.4 % Fat Mass (lbs): 138.8 lbs Muscle Mass (lbs): 124.6 lbs Total Body Water (lbs): 96.8 lbs Visceral Fat Rating : 18   Other Clinical Data Fasting: NO Labs: NO Today's Visit #: 21 Starting Date: 02/14/20    Chief Complaint:   OBESITY Mckenzie Hebert is here to discuss her progress with her obesity treatment plan.  She is on the the Category 4 Plan and states she is following her eating plan approximately 80 % of the time.  She states she is exercising NEAT Activities.   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 Her two dogs are currently healthy- thank goodness!  Cravings- she endorses increased cravings for sugar/sweets  She denies acute complaints at present.  Subjective:   1. Hypertension associated with type 2 diabetes mellitus (HCC) BP excellent at OV She denies CP with exertion. She is on ezetimibe (ZETIA) 10 MG tablet  metFORMIN (GLUCOPHAGE) 500 MG tablet  losartan (COZAAR) 25 MG tablet  rosuvastatin (CRESTOR) 10 MG tablet  metoprolol succinate (TOPROL-XL) 25 MG 24 hr tablet  Semaglutide, 1 MG/DOSE, 4 MG/3ML SOPN   2. Type 2 diabetes mellitus with hyperglycemia, without long-term current use of insulin (HCC) Lab Results  Component Value Date   HGBA1C 6.4 (H) 09/28/2023   HGBA1C 7.3 (H) 07/19/2023   HGBA1C 6.8 (H) 04/08/2023     Latest Reference Range & Units 09/08/22 09:39 09/28/23 10:16  INSULIN 2.6 - 24.9 uIU/mL 26.0 (H) 19.2  (H):  Data is abnormally high  She restarted Ozempic 0.25mg  on/about 07/19/2023 She had 3 doses of Ozempic 0.25mg  She started 0.5mg  strength 08/16/2023 She has been on the lowest mx dose for > 2 months She endorses increased cravings and subsequent increased snacking.  3. Vitamin D deficiency  Latest Reference Range & Units 09/28/23 10:16  Vitamin D, 25-Hydroxy 30.0 - 100.0 ng/mL 58.3   She is on bi-weekly Ergocalciferol- denies N/V/Muscle Weakness  Assessment/Plan:   1. Hypertension associated with type 2 diabetes mellitus (HCC) Continue  ezetimibe (ZETIA) 10 MG tablet  metFORMIN (GLUCOPHAGE) 500 MG tablet  losartan (COZAAR) 25 MG tablet  rosuvastatin (CRESTOR) 10 MG tablet  metoprolol succinate (TOPROL-XL) 25 MG 24 hr tablet  Semaglutide, 1 MG/DOSE, 4 MG/3ML SOPN   2. Type 2 diabetes mellitus with hyperglycemia, without long-term current use of insulin (HCC) Refill and INCREASE Semaglutide, 1 MG/DOSE, 4 MG/3ML SOPN Inject 1 mg as directed once a week. Dispense: 9 mL, Refills: 0 ordered   3. Vitamin D deficiency Continue bi-weekly Ergocalciferol- denies need for refill today  4. Obesity, current BMI 42.3  Mckenzie Hebert is currently in the action stage of change. As such, her goal is to continue with weight loss efforts. She has agreed to the Category 4 Plan.   Exercise goals: Older adults should follow the adult guidelines. When older adults cannot meet the adult guidelines, they should be as physically active as their abilities and conditions will allow.  Older adults  should do exercises that maintain or improve balance if they are at risk of falling.  Older adults should determine their level of effort for physical activity relative to their level of fitness.  Older adults with chronic conditions should understand whether and how their conditions affect their ability to do regular physical activity safely.  Behavioral modification strategies: increasing lean protein intake, decreasing  simple carbohydrates, increasing vegetables, increasing water intake, meal planning and cooking strategies, keeping healthy foods in the home, ways to avoid boredom eating, ways to avoid night time snacking, and planning for success.  Mckenzie Hebert has agreed to follow-up with our clinic in 4 weeks. She was informed of the importance of frequent follow-up visits to maximize her success with intensive lifestyle modifications for her multiple health conditions.   Objective:   Blood pressure 115/72, pulse 69, temperature 98 F (36.7 C), height 5\' 7"  (1.702 m), weight 270 lb (122.5 kg), last menstrual period 03/18/2008, SpO2 96%. Body mass index is 42.29 kg/m.  General: Cooperative, alert, well developed, in no acute distress. HEENT: Conjunctivae and lids unremarkable. Cardiovascular: Regular rhythm.  Lungs: Normal work of breathing. Neurologic: No focal deficits.   Lab Results  Component Value Date   CREATININE 0.69 09/28/2023   BUN 13 09/28/2023   NA 139 09/28/2023   K 4.3 09/28/2023   CL 101 09/28/2023   CO2 22 09/28/2023   Lab Results  Component Value Date   ALT 25 09/28/2023   AST 21 09/28/2023   ALKPHOS 62 09/28/2023   BILITOT 0.4 09/28/2023   Lab Results  Component Value Date   HGBA1C 6.4 (H) 09/28/2023   HGBA1C 7.3 (H) 07/19/2023   HGBA1C 6.8 (H) 04/08/2023   HGBA1C 6.8 (H) 11/05/2022   HGBA1C 7.1 (H) 09/08/2022   Lab Results  Component Value Date   INSULIN 19.2 09/28/2023   INSULIN 26.0 (H) 09/08/2022   INSULIN 9.7 09/17/2021   INSULIN 20.7 02/14/2020   Lab Results  Component Value Date   TSH 2.63 10/15/2023   Lab Results  Component Value Date   CHOL 107 10/15/2023   HDL 52 10/15/2023   LDLCALC 38 10/15/2023   TRIG 88 10/15/2023   CHOLHDL 2.1 10/15/2023   Lab Results  Component Value Date   VD25OH 58.3 09/28/2023   VD25OH 30.0 09/08/2022   VD25OH 42 01/29/2022   Lab Results  Component Value Date   WBC 5.9 04/08/2023   HGB 12.6 04/08/2023   HCT 38.5  04/08/2023   MCV 82.3 04/08/2023   PLT 243 04/08/2023   Lab Results  Component Value Date   IRON 60 01/14/2018   TIBC 398 01/14/2018   FERRITIN 22 01/14/2018   Attestation Statements:   Reviewed by clinician on day of visit: allergies, medications, problem list, medical history, surgical history, family history, social history, and previous encounter notes.  I have reviewed the above documentation for accuracy and completeness, and I agree with the above. -  Saroya Riccobono d. Herminia Warren, NP-C

## 2023-11-12 ENCOUNTER — Encounter (INDEPENDENT_AMBULATORY_CARE_PROVIDER_SITE_OTHER): Payer: Self-pay | Admitting: Adult Health

## 2023-11-15 ENCOUNTER — Encounter (INDEPENDENT_AMBULATORY_CARE_PROVIDER_SITE_OTHER): Payer: Self-pay | Admitting: Adult Health

## 2023-11-15 ENCOUNTER — Other Ambulatory Visit (INDEPENDENT_AMBULATORY_CARE_PROVIDER_SITE_OTHER): Payer: Self-pay | Admitting: Adult Health

## 2023-11-15 MED ORDER — SEMAGLUTIDE (1 MG/DOSE) 4 MG/3ML ~~LOC~~ SOPN: 1.0000 mg | PEN_INJECTOR | SUBCUTANEOUS | 0 refills | Status: DC

## 2023-11-16 DIAGNOSIS — E119 Type 2 diabetes mellitus without complications: Secondary | ICD-10-CM | POA: Diagnosis not present

## 2023-11-16 DIAGNOSIS — H52203 Unspecified astigmatism, bilateral: Secondary | ICD-10-CM | POA: Diagnosis not present

## 2023-11-16 DIAGNOSIS — H2513 Age-related nuclear cataract, bilateral: Secondary | ICD-10-CM | POA: Diagnosis not present

## 2023-11-16 DIAGNOSIS — H43813 Vitreous degeneration, bilateral: Secondary | ICD-10-CM | POA: Diagnosis not present

## 2023-11-16 LAB — HM DIABETES EYE EXAM

## 2023-11-23 ENCOUNTER — Ambulatory Visit (INDEPENDENT_AMBULATORY_CARE_PROVIDER_SITE_OTHER): Payer: Medicare HMO | Admitting: Adult Health

## 2023-12-15 ENCOUNTER — Ambulatory Visit (INDEPENDENT_AMBULATORY_CARE_PROVIDER_SITE_OTHER): Admitting: Adult Health

## 2023-12-23 ENCOUNTER — Ambulatory Visit (INDEPENDENT_AMBULATORY_CARE_PROVIDER_SITE_OTHER): Admitting: Adult Health

## 2023-12-23 ENCOUNTER — Encounter (INDEPENDENT_AMBULATORY_CARE_PROVIDER_SITE_OTHER): Payer: Self-pay | Admitting: Adult Health

## 2023-12-23 VITALS — BP 108/70 | HR 72 | Temp 98.1°F | Ht 67.0 in | Wt 268.0 lb

## 2023-12-23 DIAGNOSIS — I152 Hypertension secondary to endocrine disorders: Secondary | ICD-10-CM | POA: Diagnosis not present

## 2023-12-23 DIAGNOSIS — Z7985 Long-term (current) use of injectable non-insulin antidiabetic drugs: Secondary | ICD-10-CM | POA: Diagnosis not present

## 2023-12-23 DIAGNOSIS — E559 Vitamin D deficiency, unspecified: Secondary | ICD-10-CM | POA: Diagnosis not present

## 2023-12-23 DIAGNOSIS — E1165 Type 2 diabetes mellitus with hyperglycemia: Secondary | ICD-10-CM | POA: Diagnosis not present

## 2023-12-23 DIAGNOSIS — Z6841 Body Mass Index (BMI) 40.0 and over, adult: Secondary | ICD-10-CM | POA: Diagnosis not present

## 2023-12-23 DIAGNOSIS — E669 Obesity, unspecified: Secondary | ICD-10-CM

## 2023-12-23 DIAGNOSIS — E1159 Type 2 diabetes mellitus with other circulatory complications: Secondary | ICD-10-CM | POA: Diagnosis not present

## 2023-12-23 MED ORDER — SEMAGLUTIDE (1 MG/DOSE) 4 MG/3ML ~~LOC~~ SOPN: 1.0000 mg | PEN_INJECTOR | SUBCUTANEOUS | 0 refills | Status: DC

## 2023-12-23 NOTE — Progress Notes (Signed)
 WEIGHT SUMMARY AND BIOMETRICS  Vitals Temp: 98.1 F (36.7 C) BP: 108/70 Pulse Rate: 72 SpO2: 100 %   Anthropometric Measurements Height: 5\' 7"  (1.702 m) Weight: 268 lb (121.6 kg) BMI (Calculated): 41.96 Weight at Last Visit: 270 lb Weight Lost Since Last Visit: 2 lb Weight Gained Since Last Visit: 0 Starting Weight: 278 lb Total Weight Loss (lbs): 12 lb (5.443 kg) Peak Weight: 295 lb   Body Composition  Body Fat %: 51.3 % Fat Mass (lbs): 137.4 lbs Muscle Mass (lbs): 124 lbs Total Body Water (lbs): 96.8 lbs Visceral Fat Rating : 18   Other Clinical Data Fasting: no Labs: no Today's Visit #: 22 Starting Date: 02/14/20    Chief Complaint:   OBESITY Mckenzie Hebert is here to discuss her progress with her obesity treatment plan.  She is on the the Category 4 Plan and states she is following her eating plan approximately 75 % of the time.  She states she is exercising Walking 15 minutes 4 times per week.   Interim History:  She restarted Ozempic 0.25mg  on/about 07/19/2023 She had 3 doses of Ozempic 0.25mg  She started 0.5mg  strength 08/16/2023 Remained on the lowest mx dose of 0.5mg  for > 2 months. 11/11/2023 Ozempic 0.5mg  increased to 1mg  Tolerating well- denies SE  PCP manages Metformin 500mg  BID and HWW manages weekly Ozempic 1mg    Subjective:   1. Type 2 diabetes mellitus with hyperglycemia, without long-term current use of insulin (HCC) Lab Results  Component Value Date   HGBA1C 6.4 (H) 09/28/2023   HGBA1C 7.3 (H) 07/19/2023   HGBA1C 6.8 (H) 04/08/2023    She had 3 doses of Ozempic 0.25mg  She started 0.5mg  strength 08/16/2023 Remained on the lowest mx dose of 0.5mg  for > 2 months. 11/11/2023 Ozempic 0.5mg  increased to 1mg  Tolerating well- denies SE  PCP manages Metformin 500mg  BID and HWW manages weekly Ozempic 1mg  .  2. Hypertension associated with type 2 diabetes mellitus (HCC) BP excellent at OV She denies CP with exertion She denies tobacco/vape  use  She is on ezetimibe (ZETIA) 10 MG tablet  metFORMIN (GLUCOPHAGE) 500 MG tablet  losartan (COZAAR) 25 MG tablet  rosuvastatin (CRESTOR) 10 MG tablet  metoprolol succinate (TOPROL-XL) 25 MG 24 hr tablet  Semaglutide, 1 MG/DOSE, 4 MG/3ML SOPN   3. Vitamin D deficiency  Latest Reference Range & Units 09/28/23 10:16  Vitamin D, 25-Hydroxy 30.0 - 100.0 ng/mL 58.3   She endorses stable energy levels  Assessment/Plan:   1. Type 2 diabetes mellitus with hyperglycemia, without long-term current use of insulin (HCC) (Primary) Refill Semaglutide, 1 MG/DOSE, 4 MG/3ML SOPN Inject 1 mg as directed once a week. Dispense: 3 mL, Refills: 0 ordered   [employer] Pt Assistance Paperwork completed and faxed off!  2. Hypertension associated with type 2 diabetes mellitus (HCC) Limit Ma+ Remain active everyday! Continue  ezetimibe (ZETIA) 10 MG tablet  metFORMIN (GLUCOPHAGE) 500 MG tablet  losartan (COZAAR) 25 MG tablet  rosuvastatin (CRESTOR) 10 MG tablet  metoprolol succinate (TOPROL-XL) 25 MG 24 hr tablet  Semaglutide, 1 MG/DOSE, 4 MG/3ML SOPN   3. Vitamin D deficiency Monitor labs  4. Obesity, current BMI 42.0  Mckenzie Hebert is currently in the action stage of change. As such, her goal is to continue with weight loss efforts. She has agreed to the Category 4 Plan.   Exercise goals: Older adults should follow the adult guidelines. When older adults cannot meet the adult guidelines, they should be as physically active as  their abilities and conditions will allow.  Older adults should do exercises that maintain or improve balance if they are at risk of falling.  Older adults should determine their level of effort for physical activity relative to their level of fitness.  Older adults with chronic conditions should understand whether and how their conditions affect their ability to do regular physical activity safely.  Behavioral modification strategies: increasing lean protein intake, decreasing  simple carbohydrates, increasing vegetables, increasing water intake, decreasing eating out, no skipping meals, meal planning and cooking strategies, keeping healthy foods in the home, and planning for success.  Mckenzie Hebert has agreed to follow-up with our clinic in 4 weeks. She was informed of the importance of frequent follow-up visits to maximize her success with intensive lifestyle modifications for her multiple health conditions.   Objective:   Blood pressure 108/70, pulse 72, temperature 98.1 F (36.7 C), height 5\' 7"  (1.702 m), weight 268 lb (121.6 kg), last menstrual period 03/18/2008, SpO2 100%. Body mass index is 41.97 kg/m.  General: Cooperative, alert, well developed, in no acute distress. HEENT: Conjunctivae and lids unremarkable. Cardiovascular: Regular rhythm.  Lungs: Normal work of breathing. Neurologic: No focal deficits.   Lab Results  Component Value Date   CREATININE 0.69 09/28/2023   BUN 13 09/28/2023   NA 139 09/28/2023   K 4.3 09/28/2023   CL 101 09/28/2023   CO2 22 09/28/2023   Lab Results  Component Value Date   ALT 25 09/28/2023   AST 21 09/28/2023   ALKPHOS 62 09/28/2023   BILITOT 0.4 09/28/2023   Lab Results  Component Value Date   HGBA1C 6.4 (H) 09/28/2023   HGBA1C 7.3 (H) 07/19/2023   HGBA1C 6.8 (H) 04/08/2023   HGBA1C 6.8 (H) 11/05/2022   HGBA1C 7.1 (H) 09/08/2022   Lab Results  Component Value Date   INSULIN 19.2 09/28/2023   INSULIN 26.0 (H) 09/08/2022   INSULIN 9.7 09/17/2021   INSULIN 20.7 02/14/2020   Lab Results  Component Value Date   TSH 2.63 10/15/2023   Lab Results  Component Value Date   CHOL 107 10/15/2023   HDL 52 10/15/2023   LDLCALC 38 10/15/2023   TRIG 88 10/15/2023   CHOLHDL 2.1 10/15/2023   Lab Results  Component Value Date   VD25OH 58.3 09/28/2023   VD25OH 30.0 09/08/2022   VD25OH 42 01/29/2022   Lab Results  Component Value Date   WBC 5.9 04/08/2023   HGB 12.6 04/08/2023   HCT 38.5 04/08/2023   MCV  82.3 04/08/2023   PLT 243 04/08/2023   Lab Results  Component Value Date   IRON 60 01/14/2018   TIBC 398 01/14/2018   FERRITIN 22 01/14/2018   Attestation Statements:   Reviewed by clinician on day of visit: allergies, medications, problem list, medical history, surgical history, family history, social history, and previous encounter notes.  I have reviewed the above documentation for accuracy and completeness, and I agree with the above. -  Gianno Volner d. Gwyneth Fernandez, NP-C

## 2023-12-27 ENCOUNTER — Telehealth (INDEPENDENT_AMBULATORY_CARE_PROVIDER_SITE_OTHER): Payer: Self-pay | Admitting: Adult Health

## 2023-12-27 NOTE — Telephone Encounter (Signed)
 Good afternoon!  [employer] said that her paperwork is missing signatures as well as page 2 and page 8. She does not have a fax machine and would like to be called with a result. She does not mind coming back here to sign anything.   Thanks!

## 2023-12-28 ENCOUNTER — Encounter (INDEPENDENT_AMBULATORY_CARE_PROVIDER_SITE_OTHER): Payer: Self-pay

## 2024-01-04 ENCOUNTER — Telehealth (INDEPENDENT_AMBULATORY_CARE_PROVIDER_SITE_OTHER): Payer: Self-pay | Admitting: Adult Health

## 2024-01-04 NOTE — Telephone Encounter (Signed)
 Caller hung up because I had to place her on hold.

## 2024-01-05 NOTE — Telephone Encounter (Signed)
 Noted.

## 2024-01-23 ENCOUNTER — Other Ambulatory Visit: Payer: Self-pay | Admitting: Internal Medicine

## 2024-01-23 ENCOUNTER — Other Ambulatory Visit: Payer: Self-pay | Admitting: Cardiology

## 2024-02-02 ENCOUNTER — Telehealth (INDEPENDENT_AMBULATORY_CARE_PROVIDER_SITE_OTHER): Payer: Self-pay | Admitting: *Deleted

## 2024-02-02 ENCOUNTER — Encounter (INDEPENDENT_AMBULATORY_CARE_PROVIDER_SITE_OTHER): Payer: Self-pay | Admitting: Adult Health

## 2024-02-02 ENCOUNTER — Ambulatory Visit (INDEPENDENT_AMBULATORY_CARE_PROVIDER_SITE_OTHER): Admitting: Adult Health

## 2024-02-02 VITALS — BP 113/73 | HR 80 | Temp 98.0°F | Ht 67.0 in | Wt 271.0 lb

## 2024-02-02 DIAGNOSIS — E1159 Type 2 diabetes mellitus with other circulatory complications: Secondary | ICD-10-CM

## 2024-02-02 DIAGNOSIS — Z7985 Long-term (current) use of injectable non-insulin antidiabetic drugs: Secondary | ICD-10-CM

## 2024-02-02 DIAGNOSIS — Z6841 Body Mass Index (BMI) 40.0 and over, adult: Secondary | ICD-10-CM

## 2024-02-02 DIAGNOSIS — E1165 Type 2 diabetes mellitus with hyperglycemia: Secondary | ICD-10-CM

## 2024-02-02 DIAGNOSIS — E669 Obesity, unspecified: Secondary | ICD-10-CM

## 2024-02-02 DIAGNOSIS — I152 Hypertension secondary to endocrine disorders: Secondary | ICD-10-CM | POA: Diagnosis not present

## 2024-02-02 DIAGNOSIS — E559 Vitamin D deficiency, unspecified: Secondary | ICD-10-CM

## 2024-02-02 DIAGNOSIS — E66812 Obesity, class 2: Secondary | ICD-10-CM

## 2024-02-02 MED ORDER — SEMAGLUTIDE (2 MG/DOSE) 8 MG/3ML ~~LOC~~ SOPN: 2.0000 mg | PEN_INJECTOR | SUBCUTANEOUS | 0 refills | Status: DC

## 2024-02-02 MED ORDER — VITAMIN D (ERGOCALCIFEROL) 1.25 MG (50000 UNIT) PO CAPS: 50000.0000 [IU] | ORAL_CAPSULE | ORAL | 0 refills | Status: DC

## 2024-02-02 NOTE — Telephone Encounter (Signed)
 Faxed page #[phone] to [facility] [facility] today per their request. I had contacted and this page was the hold up on dispensing medication(increased dosage of the Semaglutide  2 mg weekly), will only do 1 box at a time(4 pens), previous one stated 6 boxes which they could not process. Confirmation has been received.

## 2024-02-02 NOTE — Telephone Encounter (Signed)
 Spoke with patient to update, said that she is completely out(Semaglutide -2 mg dose change) and would like new 2 mg script to hold her over until medication comes in.

## 2024-02-02 NOTE — Progress Notes (Signed)
 WEIGHT SUMMARY AND BIOMETRICS  Vitals Temp: 98 F (36.7 C) BP: 113/73 Pulse Rate: 80 SpO2: 96 %   Anthropometric Measurements Height: 5\' 7"  (1.702 m) Weight: 271 lb (122.9 kg) BMI (Calculated): 42.43 Weight at Last Visit: 268 lb Weight Lost Since Last Visit: 0 Weight Gained Since Last Visit: 3 lb Starting Weight: 278 lb Total Weight Loss (lbs): 9 lb (4.082 kg) Peak Weight: 295 lb   Body Composition  Body Fat %: 50.8 % Fat Mass (lbs): 138 lbs Muscle Mass (lbs): 127 lbs Total Body Water  (lbs): 96.6 lbs Visceral Fat Rating : 18   Other Clinical Data Fasting: no Labs: no Today's Visit #: 23 Starting Date: 02/14/20    Chief Complaint:   OBESITY Mckenzie Hebert is here to discuss her progress with her obesity treatment plan.  She is on the the Category 4 Plan and states she is following her eating plan approximately 50 % of the time.  She states she is exercising Walking Dogs 10 minutes 4 times per week.  Interim History:   Hunger/appetite-she is currently on weekly Ozempic  1mg  and endorses increased cravings and subsequent   Stress- monthly Ozempic  therapy costs $250  Exercise-NEAT Activities and walking her dogs  Subjective:   1. Type 2 diabetes mellitus with hyperglycemia, without long-term current use of insulin  (HCC) Lab Results  Component Value Date   HGBA1C 6.4 (H) 09/28/2023   HGBA1C 7.3 (H) 07/19/2023   HGBA1C 6.8 (H) 04/08/2023    PCP manages Metformin  500mg  BID and [facility] manages weekly Ozempic  1mg   She restarted Ozempic  0.25mg  on/about 07/19/2023 She had 3 doses of Ozempic  0.25mg  She started 0.5mg  strength 08/16/2023 Remained on the lowest mx dose of 0.5mg  for > 2 months. 11/11/2023 Ozempic  0.5mg  increased to 1mg  She reports increased appetite and more emotional eating the last several weeks. Her fasting CBG will consistently <130 She denies sx's of hypoglycemia  We have completed Novo Nordisk Patient Assistance Paperwork multiple  times.  Monthly Ozempic  therapy is $250 and causing a financial hardship.  2. Hypertension associated with type 2 diabetes mellitus (HCC) BP excellent at goal at OV She denies tobacco/vape/ETOH use She denies CP with exertion  3. Vitamin D  deficiency  Latest Reference Range & Units 09/28/23 10:16  Vitamin D , 25-Hydroxy 30.0 - 100.0 ng/mL 58.3   She is on bi-weekly Ergocalciferol - denies N/V/Muscle Weakness  Assessment/Plan:   1. Type 2 diabetes mellitus with hyperglycemia, without long-term current use of insulin  (HCC) (Primary) Refill and INCREASE Semaglutide , 2 MG/DOSE, 8 MG/3ML SOPN Inject 2 mg as directed once a week. Dispense: 3 mL, Refills: 0 ordered   2. Hypertension associated with type 2 diabetes mellitus (HCC) Limit Na+ intake Continue regular walking Continue  3. Vitamin D  deficiency Refill Vitamin D , Ergocalciferol , (DRISDOL ) 1.25 MG (50000 UNIT) CAPS capsule Take 1 capsule (50,000 Units total) by mouth every 14 (fourteen) days. Dispense: 12 capsule, Refills: 0 ordered   4. Obesity, current BMI 42.6  Mckenzie Hebert is currently in the action stage of change. As such, her goal is to continue with weight loss efforts. She has agreed to the Category 4 Plan.   Exercise goals: Older adults should follow the adult guidelines. When older adults cannot meet the adult guidelines, they should be as physically active as their abilities and conditions will allow.  Older adults should do exercises that maintain or improve balance if they are at risk of falling.  Older adults should determine their level of effort for physical activity relative  to their level of fitness.  Older adults with chronic conditions should understand whether and how their conditions affect their ability to do regular physical activity safely.  Behavioral modification strategies: increasing lean protein intake, decreasing simple carbohydrates, increasing vegetables, increasing water  intake, meal planning and cooking  strategies, keeping healthy foods in the home, ways to avoid boredom eating, and planning for success.  Hyacinth has agreed to follow-up with our clinic in 4 weeks. She was informed of the importance of frequent follow-up visits to maximize her success with intensive lifestyle modifications for her multiple health conditions.   Novo Nordisk Pt Assistance Paperwork completed and faxed  Company will ship out 4 month supply in the next 14 days  Objective:   Blood pressure 113/73, pulse 80, temperature 98 F (36.7 C), height 5\' 7"  (1.702 m), weight 271 lb (122.9 kg), last menstrual period 03/18/2008, SpO2 96%. Body mass index is 42.44 kg/m.  General: Cooperative, alert, well developed, in no acute distress. HEENT: Conjunctivae and lids unremarkable. Cardiovascular: Regular rhythm.  Lungs: Normal work of breathing. Neurologic: No focal deficits.   Lab Results  Component Value Date   CREATININE 0.69 09/28/2023   BUN 13 09/28/2023   NA 139 09/28/2023   K 4.3 09/28/2023   CL 101 09/28/2023   CO2 22 09/28/2023   Lab Results  Component Value Date   ALT 25 09/28/2023   AST 21 09/28/2023   ALKPHOS 62 09/28/2023   BILITOT 0.4 09/28/2023   Lab Results  Component Value Date   HGBA1C 6.4 (H) 09/28/2023   HGBA1C 7.3 (H) 07/19/2023   HGBA1C 6.8 (H) 04/08/2023   HGBA1C 6.8 (H) 11/05/2022   HGBA1C 7.1 (H) 09/08/2022   Lab Results  Component Value Date   INSULIN  19.2 09/28/2023   INSULIN  26.0 (H) 09/08/2022   INSULIN  9.7 09/17/2021   INSULIN  20.7 02/14/2020   Lab Results  Component Value Date   TSH 2.63 10/15/2023   Lab Results  Component Value Date   CHOL 107 10/15/2023   HDL 52 10/15/2023   LDLCALC 38 10/15/2023   TRIG 88 10/15/2023   CHOLHDL 2.1 10/15/2023   Lab Results  Component Value Date   VD25OH 58.3 09/28/2023   VD25OH 30.0 09/08/2022   VD25OH 42 01/29/2022   Lab Results  Component Value Date   WBC 5.9 04/08/2023   HGB 12.6 04/08/2023   HCT 38.5 04/08/2023    MCV 82.3 04/08/2023   PLT 243 04/08/2023   Lab Results  Component Value Date   IRON 60 01/14/2018   TIBC 398 01/14/2018   FERRITIN 22 01/14/2018    Attestation Statements:   Reviewed by clinician on day of visit: allergies, medications, problem list, medical history, surgical history, family history, social history, and previous encounter notes.  I have reviewed the above documentation for accuracy and completeness, and I agree with the above. -  Kia Stavros d. Tierney Behl, NP-C

## 2024-02-03 NOTE — Telephone Encounter (Signed)
 Message from Plan The patient currently has access to the requested medication and a Prior Authorization is not needed for the patient/medication. Patient has been updated thru voice message.

## 2024-02-07 ENCOUNTER — Telehealth (INDEPENDENT_AMBULATORY_CARE_PROVIDER_SITE_OTHER): Payer: Self-pay | Admitting: *Deleted

## 2024-02-07 NOTE — Telephone Encounter (Signed)
 Contacted patient thru voice message to update on the application for the Novo Nordisk patient assistance program approval through December 31,2025-(Ozempic  2 mg/ml manuel refill).

## 2024-02-10 ENCOUNTER — Ambulatory Visit (INDEPENDENT_AMBULATORY_CARE_PROVIDER_SITE_OTHER): Admitting: Internal Medicine

## 2024-02-10 ENCOUNTER — Encounter: Payer: Self-pay | Admitting: Internal Medicine

## 2024-02-10 VITALS — BP 130/88 | HR 90 | Ht 67.0 in

## 2024-02-10 DIAGNOSIS — F439 Reaction to severe stress, unspecified: Secondary | ICD-10-CM

## 2024-02-10 DIAGNOSIS — R42 Dizziness and giddiness: Secondary | ICD-10-CM

## 2024-02-10 DIAGNOSIS — R110 Nausea: Secondary | ICD-10-CM

## 2024-02-10 DIAGNOSIS — H8112 Benign paroxysmal vertigo, left ear: Secondary | ICD-10-CM

## 2024-02-10 DIAGNOSIS — F419 Anxiety disorder, unspecified: Secondary | ICD-10-CM

## 2024-02-10 MED ORDER — ONDANSETRON HCL 4 MG PO TABS: 4.0000 mg | ORAL_TABLET | Freq: Three times a day (TID) | ORAL | 0 refills | Status: AC | PRN

## 2024-02-10 MED ORDER — ONDANSETRON HCL 4 MG/2ML IJ SOLN
4.0000 mg | Freq: Once | INTRAMUSCULAR | Status: AC
  Administered 2024-02-10: 4 mg via INTRAMUSCULAR

## 2024-02-10 NOTE — Progress Notes (Signed)
 Mckenzie Care Team: Sylvan Evener, MD as PCP - General (Internal Medicine)  Visit Date: 02/10/24  Subjective:   Chief Complaint  Mckenzie presents with   Dizziness   Situational stress   Orthostatic VS for the past 72 hrs (Last 3 readings):  Orthostatic BP Orthostatic Pulse  02/10/24 0937 110/80 84  02/10/24 0925 140/90 90  Mckenzie Hebert,Female DOB:[birth year],68 y.o. AVW:[id number]   68 y.o.Female, ambulating with a cane, presents today for acute sick visit with Nausea; Vertigo and Lightheadedness. Mckenzie has a past medical history of Anxiety. Says that she woke up Tuesday with the room spinning, and she has been nauseated though has not vomiting, mainly belching instead. Has been using her cane to walk, and took some Phenergan  yesterday to relieve her nausea, which helped but caused drowsiness and when she woke up dizziness had returned. Her Ozempic  was increased to 2 mg this week, which she says she thought may be contributing, she has been stressed recently due to her dog's ailing health, and also helping take care of her brother-in-law. Says that she has been staying hydrated, yesterday drank 5 bottles of water  with added in some electrolyte packets. Mentions that once after standing up from a chair she did fall back into it due to her dizziness.   Past Medical History:  Diagnosis Date   AC (acromioclavicular) joint bone spurs 2008   right foot   Anxiety    Asthma    Back pain    Bilateral swelling of feet    Constipation    Dyspnea    Dysrhythmia 2019   PVC   Endometrial polyp    Heartburn    History of colon polyps    Hypothyroidism    Joint pain    Migraines    Dr Juli Oas   Osteoarthritis    PAC (premature atrial contraction)    Palpitations    cardiologist-  dr hochrein-- normal myoview 2017, holter 2015 mild ectopy   PONV (postoperative nausea and vomiting)    Pre-diabetes 2019   PVC (premature ventricular contraction)    SOB (shortness of  breath)    Thyroid  disease    Uterine fibroid    Vitamin D  deficiency     Allergies  Allergen Reactions   Percocet [Oxycodone -Acetaminophen ] Nausea Only    Family History  Problem Relation Age of Onset   Diabetes Mother    Hypertension Mother    Heart disease Mother 7       CABG   Stroke Mother    Hyperlipidemia Mother    Cancer Father        lung cancer   Depression Father    Alcohol abuse Father    Diabetes Sister    Hypertension Sister    Thyroid  disease Sister        hyperthyroidism   Breast cancer Paternal Aunt    Hypertension Sister    Autoimmune disease Sister    Leukemia Paternal Grandmother    Mental retardation Other    Social History   Social History Narrative   Lives alone.    Review of Systems  Gastrointestinal:  Positive for nausea. Negative for vomiting.       (+) Belching  Musculoskeletal:  Falls: fell back due to dizziness after standing.  Neurological:  Positive for dizziness.  Psychiatric/Behavioral:  The Mckenzie is nervous/anxious.    Objective:  Vitals: BP 130/88   Pulse 90   Ht 5\' 7"  (1.702 m)   LMP 03/18/2008 (LMP Unknown)  BMI 42.44 kg/m   Physical Exam Vitals and nursing note reviewed.  Constitutional:      General: She is not in acute distress.    Appearance: Normal appearance. She is not ill-appearing.  HENT:     Head: Normocephalic and atraumatic.     Right Ear: Tympanic membrane, ear canal and external ear normal.     Left Ear: Tympanic membrane, ear canal and external ear normal.     Mouth/Throat:     Mouth: Mucous membranes are moist.     Pharynx: Oropharynx is clear. No oropharyngeal exudate or posterior oropharyngeal erythema.  Eyes:     Extraocular Movements:     Right eye: Nystagmus present.     Left eye: Nystagmus present.     Pupils: Pupils are equal, round, and reactive to light.  Pulmonary:     Effort: Pulmonary effort is normal.     Breath sounds: Normal breath sounds. No wheezing, rhonchi or rales.   Lymphadenopathy:     Cervical: No cervical adenopathy.  Skin:    General: Skin is warm and dry.  Neurological:     Mental Status: She is alert and oriented to person, place, and time. Mental status is at baseline.     Comments: Says that dizziness was moderate while laying down, slightly worsened as she sat up, and eased slightly after standing up  Psychiatric:        Mood and Affect: Mood normal.        Behavior: Behavior normal.        Thought Content: Thought content normal.        Judgment: Judgment normal.     Results:  Studies Obtained And Personally Reviewed By Me: Labs:     Component Value Date/Time   NA 139 09/28/2023 1016   K 4.3 09/28/2023 1016   CL 101 09/28/2023 1016   CO2 22 09/28/2023 1016   GLUCOSE 109 (H) 09/28/2023 1016   GLUCOSE 130 (H) 04/08/2023 0923   BUN 13 09/28/2023 1016   CREATININE 0.69 09/28/2023 1016   CREATININE 0.65 04/08/2023 0923   CALCIUM  9.0 09/28/2023 1016   PROT 6.6 09/28/2023 1016   ALBUMIN 4.3 09/28/2023 1016   AST 21 09/28/2023 1016   ALT 25 09/28/2023 1016   ALKPHOS 62 09/28/2023 1016   BILITOT 0.4 09/28/2023 1016   GFRNONAA 94 01/28/2021 0919   GFRAA 109 01/28/2021 0919    Lab Results  Component Value Date   WBC 5.9 04/08/2023   HGB 12.6 04/08/2023   HCT 38.5 04/08/2023   MCV 82.3 04/08/2023   PLT 243 04/08/2023   Lab Results  Component Value Date   CHOL 107 10/15/2023   HDL 52 10/15/2023   LDLCALC 38 10/15/2023   TRIG 88 10/15/2023   CHOLHDL 2.1 10/15/2023   Lab Results  Component Value Date   HGBA1C 6.4 (H) 09/28/2023    Lab Results  Component Value Date   TSH 2.63 10/15/2023    Assessment & Plan:   Meds ordered this encounter  Medications   ondansetron  (ZOFRAN ) injection 4 mg   ondansetron  (ZOFRAN ) 4 MG tablet    Sig: Take 1 tablet (4 mg total) by mouth every 8 (eight) hours as needed for nausea or vomiting.    Dispense:  30 tablet    Refill:  0   Benign Positional Vertigo; Nausea: Multiple factors  could have potentially triggered vertigo. Has been ambulating with cane to prevent falls, though did have one instance where she fell  back into a chair after standing up too swiftly. Took 1 Phenergan  to relieve her nausea, which it did but cause drowsiness and subsequently woke up dizzy again. Today, in-office Zofran  was given IM to improve nausea. Sending in Zofran  4 mg to take every 8 hours as needed for nausea and vomiting. Continue with Xanax  up to 3 times daily as needed for vertigo and anxiety  Anxiety; Situational Stress managed with Xanax  1 mg as needed - can take three times daily for current situational stress and vertigo as well.      I,Emily Lagle,acting as a Neurosurgeon for Sylvan Evener, MD.,have documented all relevant documentation on the behalf of Sylvan Evener, MD,as directed by  Sylvan Evener, MD while in the presence of Sylvan Evener, MD.   I, Sylvan Evener, MD, have reviewed all documentation for this visit. The documentation on 02/10/24 for the exam, diagnosis, procedures, and orders are all accurate and complete.

## 2024-02-10 NOTE — Patient Instructions (Addendum)
 You may take Xanax   one mg tablets that you have on hand one half to one tablet up to 3 times a day as needed for vertigo. Vertigo should improve in 3-5 days. You were given Zofran  injection in office today for nausea. Rest at home and stay well hydrated. I have  sent Zofran  tablets to have on hand should you need something for nausea. Let me know if you need an early refill on Xanax .

## 2024-02-17 ENCOUNTER — Encounter (INDEPENDENT_AMBULATORY_CARE_PROVIDER_SITE_OTHER): Payer: Self-pay | Admitting: Adult Health

## 2024-02-17 ENCOUNTER — Ambulatory Visit (INDEPENDENT_AMBULATORY_CARE_PROVIDER_SITE_OTHER): Admitting: Adult Health

## 2024-02-17 VITALS — BP 113/72 | HR 67 | Temp 98.5°F | Ht 67.0 in | Wt 268.0 lb

## 2024-02-17 DIAGNOSIS — E1159 Type 2 diabetes mellitus with other circulatory complications: Secondary | ICD-10-CM | POA: Diagnosis not present

## 2024-02-17 DIAGNOSIS — Z7984 Long term (current) use of oral hypoglycemic drugs: Secondary | ICD-10-CM

## 2024-02-17 DIAGNOSIS — E669 Obesity, unspecified: Secondary | ICD-10-CM

## 2024-02-17 DIAGNOSIS — E559 Vitamin D deficiency, unspecified: Secondary | ICD-10-CM | POA: Diagnosis not present

## 2024-02-17 DIAGNOSIS — I152 Hypertension secondary to endocrine disorders: Secondary | ICD-10-CM

## 2024-02-17 DIAGNOSIS — H8112 Benign paroxysmal vertigo, left ear: Secondary | ICD-10-CM

## 2024-02-17 DIAGNOSIS — E1165 Type 2 diabetes mellitus with hyperglycemia: Secondary | ICD-10-CM | POA: Diagnosis not present

## 2024-02-17 DIAGNOSIS — E66812 Obesity, class 2: Secondary | ICD-10-CM

## 2024-02-17 DIAGNOSIS — Z7985 Long-term (current) use of injectable non-insulin antidiabetic drugs: Secondary | ICD-10-CM | POA: Diagnosis not present

## 2024-02-17 DIAGNOSIS — Z6841 Body Mass Index (BMI) 40.0 and over, adult: Secondary | ICD-10-CM | POA: Diagnosis not present

## 2024-02-17 NOTE — Progress Notes (Signed)
 WEIGHT SUMMARY AND BIOMETRICS  Vitals Temp: 98.5 F (36.9 C) BP: 113/72 Pulse Rate: 67 SpO2: 96 %   Anthropometric Measurements Height: 5' 7 (1.702 m) Weight: 268 lb (121.6 kg) BMI (Calculated): 41.96 Weight at Last Visit: 271 lb Weight Lost Since Last Visit: 3 lb Weight Gained Since Last Visit: 0 Starting Weight: 278 lb Total Weight Loss (lbs): 12 lb (5.443 kg) Peak Weight: 295 lb   Body Composition  Body Fat %: 51.1 % Fat Mass (lbs): 137.4 lbs Muscle Mass (lbs): 124.8 lbs Total Body Water  (lbs): 96.6 lbs Visceral Fat Rating : 18   Other Clinical Data Fasting: no Labs: no Today's Visit #: 24 Starting Date: 02/14/20    Chief Complaint:   OBESITY Mckenzie Hebert is here to discuss her progress with her obesity treatment plan.  She is on the the Category 4 Plan and states she is following her eating plan approximately 75 % of the time.  She states she is exercising Walking her Dogs 10 minutes 3-4 times per week.   Interim History:  Ozempic  1mg  increased to 2mg  on/about 02/02/2024 She started max dose Ozempic  2mg  on 02/07/2024 She developed dizziness, nausea without vomiting, and dyspepsia  She was evaluated by hr established PCP/Dr. Liane Redman on 02/10/2024- felt that her sx's were consistent with increased anxiety and acute vertigo sx's.  Mckenzie Hebert has been treating sx's with increased hydration, rest, and antiemetics. She has remained off Ozempic  2mg  (last use 02/07/24) and continued Metformin  500mg - 2 tabs with breakfast. She currently denies GI upset, still experiencing dizziness. Dizziness worsened when walking or laying supine. She denies falls this week.  She reports being able to safely drive to [facility] clinic today  She denies acute cardiac sx's at present  Subjective:   1. Benign positional vertigo, left PCP OV Notes 02/10/2024  68 y.o.Female, ambulating with a cane, presents today for acute sick visit with Nausea; Vertigo and Lightheadedness. Patient has a  past medical history of Anxiety. Says that she woke up Tuesday with the room spinning, and she has been nauseated though has not vomiting, mainly belching instead. Has been using her cane to walk, and took some Phenergan  yesterday to relieve her nausea, which helped but caused drowsiness and when she woke up dizziness had returned. Her Ozempic  was increased to 2 mg this week, which she says she thought may be contributing, she has been stressed recently due to her dog's ailing health, and also helping take care of her brother-in-law. Says that she has been staying hydrated, yesterday drank 5 bottles of water  with added in some electrolyte packets. Mentions that once after standing up from a chair she did fall back into it due to her dizziness.  Benign Positional Vertigo; Nausea: Multiple factors could have potentially triggered vertigo. Has been ambulating with cane to prevent falls, though did have one instance where she fell back into a chair after standing up too swiftly. Took 1 Phenergan  to relieve her nausea, which it did but cause drowsiness and subsequently woke up dizzy again. Today, in-office Zofran  was given IM to improve nausea. Sending in Zofran  4 mg to take every 8 hours as needed for nausea and vomiting. Continue with Xanax  up to 3 times daily as needed for vertigo and anxiety   Anxiety; Situational Stress managed with Xanax  1 mg as needed - can take three times daily for current situational stress and vertigo as well.   2. Type 2 diabetes mellitus with hyperglycemia, without long-term current use  of insulin  (HCC) PCP manages Metformin  500mg  BID and [facility] manages weekly Ozempic  1mg    She restarted Ozempic  0.25mg  on/about 07/19/2023 Ozempic  0.25mg  to 0.5mg  strength on/about 08/16/2023 Ozempic  0.5mg  increased to 1mg  on/about 11/11/2023  Ozempic  1mg  increased to 2mg  on/about 02/02/2024 Ozempic  1mg  increased to 2mg  on/about 02/02/2024 She started max dose Ozempic  2mg  on 02/07/2024 She developed  dizziness, nausea without vomiting, and dyspepsia She would like to remain off GLP-1 therapy  To keep blood glucose/A1c well controlled- limit sugar/simple CHO and continue Metformin  therapy per PCP  3. Hypertension associated with type 2 diabetes mellitus (HCC) BP stable and at goal at OV She continues to experience dizziness, however denies CP/palpitations She is on metoprolol  succinate (TOPROL -XL) 25 MG 24 hr tablet  losartan  (COZAAR ) 25 MG tablet   Assessment/Plan:   1. Benign positional vertigo, left (Primary) Lifestyle Adjustments: Rest: Adequate rest can help the body recover from vertigo episodes.  Stress Management: Techniques like relaxation exercises or deep breathing can help reduce anxiety, which can worsen vertigo.  Dietary Changes: Limiting alcohol, caffeine, and salt intake may help reduce vertigo symptoms.  Fluid Intake: Staying hydrated is important, as dehydration can exacerbate vertigo.  Safe Movement: Moving slowly and carefully when changing positions or getting out of bed can prevent vertigo attacks.  Avoiding Triggers: Identifying and avoiding specific movements or situations that trigger vertigo can help prevent attacks.  Check Labs -CMP -Vit D Level  2. Type 2 diabetes mellitus with hyperglycemia, without long-term current use of insulin  (HCC) Remain off Ozempic  Continue Metformin  per PCP Check Labs -A1c  3. Hypertension associated with type 2 diabetes mellitus (HCC) Increase hydration Continue  metoprolol  succinate (TOPROL -XL) 25 MG 24 hr tablet  losartan  (COZAAR ) 25 MG tablet   4. Obesity, current BMI 42.1  Mckenzie Hebert is currently in the action stage of change. As such, her goal is to continue with weight loss efforts. She has agreed to the Category 4 Plan.   Exercise goals: No exercise has been prescribed at this time.  Behavioral modification strategies: increasing lean protein intake, decreasing simple carbohydrates, increasing vegetables,  increasing water  intake, no skipping meals, meal planning and cooking strategies, keeping healthy foods in the home, ways to avoid boredom eating, better snacking choices, and planning for success.  Mckenzie Hebert has agreed to follow-up with our clinic in 4 weeks. She was informed of the importance of frequent follow-up visits to maximize her success with intensive lifestyle modifications for her multiple health conditions.   Mckenzie Hebert was informed we would discuss her lab results at her next visit unless there is a critical issue that needs to be addressed sooner. Mckenzie Hebert agreed to keep her next visit at the agreed upon time to discuss these results.  Objective:   Blood pressure 113/72, pulse 67, temperature 98.5 F (36.9 C), height 5' 7 (1.702 m), weight 268 lb (121.6 kg), last menstrual period 03/18/2008, SpO2 96%. Body mass index is 41.97 kg/m.  General: Cooperative, alert, well developed, in no acute distress. HEENT: Conjunctivae and lids unremarkable. Cardiovascular: Regular rhythm.  Lungs: Normal work of breathing. Neurologic: No focal deficits.   Lab Results  Component Value Date   CREATININE 0.69 09/28/2023   BUN 13 09/28/2023   NA 139 09/28/2023   K 4.3 09/28/2023   CL 101 09/28/2023   CO2 22 09/28/2023   Lab Results  Component Value Date   ALT 25 09/28/2023   AST 21 09/28/2023   ALKPHOS 62 09/28/2023   BILITOT 0.4 09/28/2023   Lab  Results  Component Value Date   HGBA1C 6.4 (H) 09/28/2023   HGBA1C 7.3 (H) 07/19/2023   HGBA1C 6.8 (H) 04/08/2023   HGBA1C 6.8 (H) 11/05/2022   HGBA1C 7.1 (H) 09/08/2022   Lab Results  Component Value Date   INSULIN  19.2 09/28/2023   INSULIN  26.0 (H) 09/08/2022   INSULIN  9.7 09/17/2021   INSULIN  20.7 02/14/2020   Lab Results  Component Value Date   TSH 2.63 10/15/2023   Lab Results  Component Value Date   CHOL 107 10/15/2023   HDL 52 10/15/2023   LDLCALC 38 10/15/2023   TRIG 88 10/15/2023   CHOLHDL 2.1 10/15/2023   Lab Results   Component Value Date   VD25OH 58.3 09/28/2023   VD25OH 30.0 09/08/2022   VD25OH 42 01/29/2022   Lab Results  Component Value Date   WBC 5.9 04/08/2023   HGB 12.6 04/08/2023   HCT 38.5 04/08/2023   MCV 82.3 04/08/2023   PLT 243 04/08/2023   Lab Results  Component Value Date   IRON 60 01/14/2018   TIBC 398 01/14/2018   FERRITIN 22 01/14/2018   Attestation Statements:   Reviewed by clinician on day of visit: allergies, medications, problem list, medical history, surgical history, family history, social history, and previous encounter notes.  I have reviewed the above documentation for accuracy and completeness, and I agree with the above. -  Kyrstan Gotwalt d. Jarius Dieudonne, NP-C

## 2024-02-18 ENCOUNTER — Other Ambulatory Visit: Payer: Self-pay | Admitting: Internal Medicine

## 2024-02-18 ENCOUNTER — Encounter (INDEPENDENT_AMBULATORY_CARE_PROVIDER_SITE_OTHER): Payer: Self-pay | Admitting: Adult Health

## 2024-02-18 DIAGNOSIS — Z860100 Personal history of colon polyps, unspecified: Secondary | ICD-10-CM | POA: Insufficient documentation

## 2024-02-18 LAB — COMPREHENSIVE METABOLIC PANEL WITH GFR
ALT: 23 IU/L (ref 0–32)
AST: 18 IU/L (ref 0–40)
Albumin: 4.4 g/dL (ref 3.9–4.9)
Alkaline Phosphatase: 67 IU/L (ref 44–121)
BUN/Creatinine Ratio: 23 (ref 12–28)
BUN: 16 mg/dL (ref 8–27)
Bilirubin Total: 0.3 mg/dL (ref 0.0–1.2)
CO2: 22 mmol/L (ref 20–29)
Calcium: 9 mg/dL (ref 8.7–10.3)
Chloride: 100 mmol/L (ref 96–106)
Creatinine, Ser: 0.69 mg/dL (ref 0.57–1.00)
Globulin, Total: 2.6 g/dL (ref 1.5–4.5)
Glucose: 99 mg/dL (ref 70–99)
Potassium: 4.7 mmol/L (ref 3.5–5.2)
Sodium: 138 mmol/L (ref 134–144)
Total Protein: 7 g/dL (ref 6.0–8.5)
eGFR: 94 mL/min/{1.73_m2} (ref >59)

## 2024-02-18 LAB — VITAMIN D 25 HYDROXY (VIT D DEFICIENCY, FRACTURES): Vit D, 25-Hydroxy: 39.5 ng/mL (ref 30.0–100.0)

## 2024-02-18 LAB — HEMOGLOBIN A1C
Est. average glucose Bld gHb Est-mCnc: 128 mg/dL
Hgb A1c MFr Bld: 6.1 % — ABNORMAL HIGH (ref 4.8–5.6)

## 2024-02-27 ENCOUNTER — Other Ambulatory Visit (INDEPENDENT_AMBULATORY_CARE_PROVIDER_SITE_OTHER): Payer: Self-pay | Admitting: Adult Health

## 2024-03-11 ENCOUNTER — Other Ambulatory Visit: Payer: Self-pay | Admitting: Internal Medicine

## 2024-03-13 ENCOUNTER — Encounter: Payer: Self-pay | Admitting: Internal Medicine

## 2024-03-15 ENCOUNTER — Ambulatory Visit: Payer: Self-pay

## 2024-03-15 NOTE — Telephone Encounter (Signed)
 FYI Only or Action Required?: FYI only for provider.  Patient was last seen in primary care on 02/17/2024 by Jonel Rockie BIRCH, NP.  Called Nurse Triage reporting Dizziness.  Symptoms began several weeks ago.  Interventions attempted: Rest, hydration, or home remedies.  Symptoms are: unchanged.  Triage Disposition: See PCP When Office is Open (Within 3 Days)  Patient/caregiver understands and will follow disposition?: Yes       1. DESCRIPTION: Describe your dizziness. Loopy   2. VERTIGO: Do you feel like either you or the room is spinning or tilting? Room spinning  3. LIGHTHEADED: Do you feel lightheaded? (e.g., somewhat faint, woozy, weak upon standing)  Just when laying down  4. SEVERITY: How bad is it? Can you walk? - MILD: Feels slightly dizzy and unsteady, but is walking normally. - MODERATE: Feels unsteady when walking, but not falling; interferes with normal activities (e.g., school, work). - SEVERE: Unable to walk without falling, or requires assistance to walk without falling.  Mild  5. ONSET: When did the dizziness begin? 5 wks  6. AGGRAVATING FACTORS: Does anything make it worse? (e.g., standing, change in head position) laying on left side  7. CAUSE: What do you think is causing the dizziness? Unknown  8. RECURRENT SYMPTOM: Have you had dizziness before? If Yes, ask: When was the last time? What happened that time?  9. OTHER SYMPTOMS: Do you have any other symptoms? (e.g., earache, headache, numbness, tinnitus, vomiting, weakness)  no  Copied from CRM #[id number]. Topic: Clinical - Red Word Triage >> Mar 15, 2024  3:26 PM Elle L wrote: Red Word that prompted transfer to Nurse Triage: The patient states that she has been experiencing vertigo and lightheadedness. Reason for Disposition  [1] MODERATE dizziness (e.g., vertigo; feels very unsteady, interferes with normal activities) AND [2] has been evaluated by doctor (or NP/PA) for  this  Protocols used: Dizziness - Vertigo-A-AH

## 2024-03-16 ENCOUNTER — Encounter: Payer: Self-pay | Admitting: Internal Medicine

## 2024-03-16 ENCOUNTER — Ambulatory Visit: Admitting: Internal Medicine

## 2024-03-16 VITALS — BP 120/80 | HR 74 | Ht 67.0 in | Wt 270.0 lb

## 2024-03-16 DIAGNOSIS — H8110 Benign paroxysmal vertigo, unspecified ear: Secondary | ICD-10-CM

## 2024-03-16 DIAGNOSIS — R42 Dizziness and giddiness: Secondary | ICD-10-CM

## 2024-03-16 DIAGNOSIS — H5500 Unspecified nystagmus: Secondary | ICD-10-CM

## 2024-03-16 MED ORDER — METHYLPREDNISOLONE ACETATE 80 MG/ML IJ SUSP
80.0000 mg | Freq: Once | INTRAMUSCULAR | Status: AC
  Administered 2024-03-16: 80 mg via INTRAMUSCULAR

## 2024-03-16 MED ORDER — AZITHROMYCIN 250 MG PO TABS: ORAL_TABLET | ORAL | 0 refills | Status: AC

## 2024-03-16 NOTE — Patient Instructions (Addendum)
 Referral for Epley manuever at [facility] PT location  at [facility].Take Zithromax  Z pak as directed.Depomedrol 80 mg IM given. May have serous otitis media contributing to vertigo. Continue Xanax  as prescribed.

## 2024-03-16 NOTE — Progress Notes (Signed)
 Patient Care Team: [name], MD as PCP - General (Internal Medicine)  Visit Date: 03/16/24  Subjective:   Chief Complaint  Patient presents with   Dizziness   Vitals:   03/16/24 1402 03/16/24 1403 03/16/24 1405 03/16/24 1406  BP: 100/70 (!) 122/90 120/80 120/80   Patient PI:Mckenzie Hebert,Female DOB:[birth year],68 y.o. FMW:[id number]   68 y.o.Female presents today for 5 week follow-up for Vertigo. Patient has a past medical history of Anxiety. Not ambulating with a cane this visit. Initially seen for this on 02/10/2024, she was prescribed Xanax  1 mg for relief along with Zofran  for nausea. Today she says that while her vertigo hasn't resolved, it has improved - she has also tried increasing her hydration, electrolytes, and head exercises for additional relief. Denies sinus/nasal congestion or post-nasal drip that could possibly be exacerbating this issue.   Past Medical History:  Diagnosis Date   AC (acromioclavicular) joint bone spurs 2008   right foot   Anxiety    Asthma    Back pain    Bilateral swelling of feet    Constipation    Dyspnea    Dysrhythmia 2019   PVC   Endometrial polyp    Heartburn    History of colon polyps    Hypothyroidism    Joint pain    Migraines    Dr Malcom   Osteoarthritis    PAC (premature atrial contraction)    Palpitations    cardiologist-  dr hochrein-- normal myoview 2017, holter 2015 mild ectopy   PONV (postoperative nausea and vomiting)    Pre-diabetes 2019   PVC (premature ventricular contraction)    SOB (shortness of breath)    Thyroid  disease    Uterine fibroid    Vitamin D  deficiency     Allergies  Allergen Reactions   Percocet [Oxycodone -Acetaminophen ] Nausea Only    Family History  Problem Relation Age of Onset   Diabetes Mother    Hypertension Mother    Heart disease Mother 29       CABG   Stroke Mother    Hyperlipidemia Mother    Cancer Father        lung cancer   Depression Father    Alcohol abuse  Father    Diabetes Sister    Hypertension Sister    Thyroid  disease Sister        hyperthyroidism   Breast cancer Paternal Aunt    Hypertension Sister    Autoimmune disease Sister    Leukemia Paternal Grandmother    Mental retardation Other    Social History   Social History Narrative   Lives alone.     Review of Systems  HENT:  Negative for congestion, ear discharge and ear pain.   Neurological:  Positive for dizziness (vertigo).  All other systems reviewed and are negative.    Objective:  Vitals: BP 120/80   Pulse 74   Ht 5' 7 (1.702 m)   Wt 270 lb (122.5 kg)   LMP 03/18/2008 (LMP Unknown)   SpO2 96%   BMI 42.29 kg/m   Physical Exam Vitals and nursing note reviewed.  Constitutional:      General: She is not in acute distress.    Appearance: Normal appearance. She is not toxic-appearing.  HENT:     Head: Normocephalic and atraumatic.     Right Ear: Tympanic membrane, ear canal and external ear normal.     Left Ear: Tympanic membrane, ear canal and external ear normal.  Eyes:  General: Vision grossly intact.     Extraocular Movements:     Right eye: Nystagmus present.     Left eye: Nystagmus present.     Pupils: Pupils are equal, round, and reactive to light.     Comments: Nystagmus rightward   Pulmonary:     Effort: Pulmonary effort is normal.  Skin:    General: Skin is warm and dry.  Neurological:     General: No focal deficit present.     Mental Status: She is alert and oriented to person, place, and time. Mental status is at baseline.     Cranial Nerves: Cranial nerves 2-12 are intact.     Motor: Motor function is intact. No weakness.  Psychiatric:        Mood and Affect: Mood normal.        Behavior: Behavior normal.        Thought Content: Thought content normal.        Judgment: Judgment normal.     Results:  Studies Obtained And Personally Reviewed By Me: Labs:     Component Value Date/Time   NA 138 02/17/2024 1613   K 4.7 02/17/2024  1613   CL 100 02/17/2024 1613   CO2 22 02/17/2024 1613   GLUCOSE 99 02/17/2024 1613   GLUCOSE 130 (H) 04/08/2023 0923   BUN 16 02/17/2024 1613   CREATININE 0.69 02/17/2024 1613   CREATININE 0.65 04/08/2023 0923   CALCIUM  9.0 02/17/2024 1613   PROT 7.0 02/17/2024 1613   ALBUMIN 4.4 02/17/2024 1613   AST 18 02/17/2024 1613   ALT 23 02/17/2024 1613   ALKPHOS 67 02/17/2024 1613   BILITOT 0.3 02/17/2024 1613   GFRNONAA 94 01/28/2021 0919   GFRAA 109 01/28/2021 0919    Lab Results  Component Value Date   WBC 5.9 04/08/2023   HGB 12.6 04/08/2023   HCT 38.5 04/08/2023   MCV 82.3 04/08/2023   PLT 243 04/08/2023   Lab Results  Component Value Date   CHOL 107 10/15/2023   HDL 52 10/15/2023   LDLCALC 38 10/15/2023   TRIG 88 10/15/2023   CHOLHDL 2.1 10/15/2023   Lab Results  Component Value Date   HGBA1C 6.1 (H) 02/17/2024    Lab Results  Component Value Date   TSH 2.63 10/15/2023    Assessment & Plan:   Orders Placed This Encounter  Procedures   Ambulatory referral to Physical Therapy   Meds ordered this encounter  Medications   azithromycin  (ZITHROMAX ) 250 MG tablet    Sig: Take 2 tablets on day 1, then 1 tablet daily on days 2 through 5    Dispense:  6 tablet    Refill:  0   Vertigo: Taking  Xanax  1 mg for vertigo as prescribed, and she has also tried increasing her hydration, and  is doing exercises , which has not resolved her vertigo, though it has improved. Also, Sending in  Azithromycin  250 mg - take 2 tablets on Day 1 and 1 tablet on Days 2-5 for ear congestion which is possibly contributing to vertigo. Recommending referral for Epley maneuver. Referral to PT at [facility].     I,Emily Lagle,acting as a Neurosurgeon for Ronal JINNY Hailstone, MD.,have documented all relevant documentation on the behalf of Ronal JINNY Hailstone, MD,as directed by  Ronal JINNY Hailstone, MD while in the presence of Ronal JINNY Hailstone, MD.   I, Ronal JINNY Hailstone, MD, have reviewed all documentation for  this visit. The documentation on  03/26/24 for the exam, diagnosis, procedures, and orders are all accurate and complete.

## 2024-03-20 ENCOUNTER — Ambulatory Visit (INDEPENDENT_AMBULATORY_CARE_PROVIDER_SITE_OTHER): Admitting: Adult Health

## 2024-03-20 ENCOUNTER — Encounter (INDEPENDENT_AMBULATORY_CARE_PROVIDER_SITE_OTHER): Payer: Self-pay | Admitting: Adult Health

## 2024-03-20 VITALS — BP 111/63 | HR 65 | Temp 98.8°F | Ht 67.0 in | Wt 268.0 lb

## 2024-03-20 DIAGNOSIS — I152 Hypertension secondary to endocrine disorders: Secondary | ICD-10-CM | POA: Diagnosis not present

## 2024-03-20 DIAGNOSIS — E669 Obesity, unspecified: Secondary | ICD-10-CM | POA: Diagnosis not present

## 2024-03-20 DIAGNOSIS — E559 Vitamin D deficiency, unspecified: Secondary | ICD-10-CM | POA: Diagnosis not present

## 2024-03-20 DIAGNOSIS — E1165 Type 2 diabetes mellitus with hyperglycemia: Secondary | ICD-10-CM

## 2024-03-20 DIAGNOSIS — H8110 Benign paroxysmal vertigo, unspecified ear: Secondary | ICD-10-CM | POA: Diagnosis not present

## 2024-03-20 DIAGNOSIS — Z6841 Body Mass Index (BMI) 40.0 and over, adult: Secondary | ICD-10-CM

## 2024-03-20 DIAGNOSIS — E1159 Type 2 diabetes mellitus with other circulatory complications: Secondary | ICD-10-CM | POA: Diagnosis not present

## 2024-03-20 DIAGNOSIS — Z7984 Long term (current) use of oral hypoglycemic drugs: Secondary | ICD-10-CM | POA: Diagnosis not present

## 2024-03-20 MED ORDER — VITAMIN D (ERGOCALCIFEROL) 1.25 MG (50000 UNIT) PO CAPS: 50000.0000 [IU] | ORAL_CAPSULE | ORAL | 0 refills | Status: AC

## 2024-03-20 NOTE — Progress Notes (Signed)
 WEIGHT SUMMARY AND BIOMETRICS  Vitals Temp: 98.8 F (37.1 C) BP: 111/63 Pulse Rate: 65 SpO2: 100 %   Anthropometric Measurements Height: 5' 7 (1.702 m) Weight: 268 lb (121.6 kg) BMI (Calculated): 41.96 Weight at Last Visit: 268 lb Weight Lost Since Last Visit: 0 Weight Gained Since Last Visit: 0 Starting Weight: 278 lb Total Weight Loss (lbs): 10 lb (4.536 kg) Peak Weight: 295 lb   Body Composition  Body Fat %: 50.9 % Fat Mass (lbs): 136.8 lbs Muscle Mass (lbs): 125.2 lbs Total Body Water  (lbs): 96.2 lbs Visceral Fat Rating : 18   Other Clinical Data Fasting: no Labs: no Today's Visit #: 25 Starting Date: 02/14/20    Chief Complaint:   OBESITY Mckenzie Hebert is here to discuss her progress with her obesity treatment plan.  She is on the the Category 4 Plan and states she is following her eating plan approximately 50 % of the time.  She states she is exercising Walking 10-15 minutes 4 times per week.  Interim History:  She has remained off Ozempic  2mg  (last use 02/07/24) and continued Metformin  500mg - 2 tabs with breakfast.   03/16/2024 PCP OV to treat BPPV PCP provided steroid injection, ABX Rx, and Referral for Epley manuever at [facility] PT location near near [facility]  Reviewed Bioimpedance Results with pt: Muscle Mass: +0.4 lb Adipose Mass: -0.6 lb  Subjective:   1. Hypertension associated with type 2 diabetes mellitus (HCC) Discussed Labs 02/17/2024 CMP: Electrolytes, Kidney Fx, Liver Enzymes- normal  BP at goal at OV  She is currently on metFORMIN  (GLUCOPHAGE ) 500 MG tablet  rosuvastatin  (CRESTOR ) 10 MG tablet  metoprolol  succinate (TOPROL -XL) 25 MG 24 hr tablet  losartan  (COZAAR ) 25 MG tablet   2. Type 2 diabetes mellitus with hyperglycemia, without long-term current use of insulin  [facility]) Discussed Labs Lab Results  Component Value Date   HGBA1C 6.1 (H) 02/17/2024   HGBA1C 6.4 (H) 09/28/2023   HGBA1C 7.3 (H) 07/19/2023      Latest Reference Range & Units 02/17/24 16:13  Glucose 70 - 99 mg/dL 99  Hemoglobin J8R 4.8 - 5.6 % 6.1 (H)  Est. average glucose Bld gHb Est-mCnc mg/dL 871  (H): Data is abnormally high  CBG and A1c both at goal. She has remained off Ozempic  2mg  (last use 02/07/24) and continued Metformin  500mg - 2 tabs with breakfast.   3. Benign paroxysmal positional vertigo, unspecified laterality 03/16/2024 PCP OV  She was given prednisone  injection and course of ABX She will take final dose of Azithromycin  today PCP referred her to  Referral for Epley manuever at [facility] PT location near near [facility].   She endorses dizziness when laying supine on her left side. She is able to ambulate and drive safely. She does not need to hold onto furniture or walls  4. Vitamin D  deficiency Discussed Labs  Latest Reference Range & Units 02/17/24 16:13  Vitamin D , 25-Hydroxy 30.0 - 100.0 ng/mL 39.5   Vit D level stable, yet below goal of 50-70 She is on weekly Ergocalciferol - denies N/V/Muscle Weakness  Assessment/Plan:   1. Hypertension associated with type 2 diabetes mellitus (HCC) Continue healthy eating and regular walking  2. Type 2 diabetes mellitus with hyperglycemia, without long-term current use of insulin  (HCC) (Primary) Continue healthy eating and regular walking  3. Benign paroxysmal positional vertigo, unspecified laterality Establish with Referral for Epley manuever at [facility] PT location near near [facility].  Remain well hydrated  and limit stress  4. Vitamin D  deficiency Refill Vitamin D , Ergocalciferol , (DRISDOL ) 1.25 MG (50000 UNIT) CAPS capsule Take 1 capsule (50,000 Units total) by mouth every 7 (seven) days. Dispense: 8 capsule, Refills: 0 ordered   5. Obesity, current BMI 42.1  Mckenzie Hebert is currently in the action stage of change. As such, her goal is to continue with weight loss efforts. She has agreed to the Category 4 Plan.    Exercise goals: Older adults should follow the adult guidelines. When older adults cannot meet the adult guidelines, they should be as physically active as their abilities and conditions will allow.  Older adults should do exercises that maintain or improve balance if they are at risk of falling.  Older adults should determine their level of effort for physical activity relative to their level of fitness.  Older adults with chronic conditions should understand whether and how their conditions affect their ability to do regular physical activity safely.  Behavioral modification strategies: increasing lean protein intake, decreasing simple carbohydrates, increasing vegetables, increasing water  intake, no skipping meals, meal planning and cooking strategies, keeping healthy foods in the home, ways to avoid boredom eating, avoiding temptations, and planning for success.  Mckenzie Hebert has agreed to follow-up with our clinic in 4 weeks. She was informed of the importance of frequent follow-up visits to maximize her success with intensive lifestyle modifications for her multiple health conditions.   Objective:   Blood pressure 111/63, pulse 65, temperature 98.8 F (37.1 C), height 5' 7 (1.702 m), weight 268 lb (121.6 kg), last menstrual period 03/18/2008, SpO2 100%. Body mass index is 41.97 kg/m.  General: Cooperative, alert, well developed, in no acute distress. HEENT: Conjunctivae and lids unremarkable. Cardiovascular: Regular rhythm.  Lungs: Normal work of breathing. Neurologic: No focal deficits.   Lab Results  Component Value Date   CREATININE 0.69 02/17/2024   BUN 16 02/17/2024   NA 138 02/17/2024   K 4.7 02/17/2024   CL 100 02/17/2024   CO2 22 02/17/2024   Lab Results  Component Value Date   ALT 23 02/17/2024   AST 18 02/17/2024   ALKPHOS 67 02/17/2024   BILITOT 0.3 02/17/2024   Lab Results  Component Value Date   HGBA1C 6.1 (H) 02/17/2024   HGBA1C 6.4 (H) 09/28/2023   HGBA1C 7.3  (H) 07/19/2023   HGBA1C 6.8 (H) 04/08/2023   HGBA1C 6.8 (H) 11/05/2022   Lab Results  Component Value Date   INSULIN  19.2 09/28/2023   INSULIN  26.0 (H) 09/08/2022   INSULIN  9.7 09/17/2021   INSULIN  20.7 02/14/2020   Lab Results  Component Value Date   TSH 2.63 10/15/2023   Lab Results  Component Value Date   CHOL 107 10/15/2023   HDL 52 10/15/2023   LDLCALC 38 10/15/2023   TRIG 88 10/15/2023   CHOLHDL 2.1 10/15/2023   Lab Results  Component Value Date   VD25OH 39.5 02/17/2024   VD25OH 58.3 09/28/2023   VD25OH 30.0 09/08/2022   Lab Results  Component Value Date   WBC 5.9 04/08/2023   HGB 12.6 04/08/2023   HCT 38.5 04/08/2023   MCV 82.3 04/08/2023   PLT 243 04/08/2023   Attestation Statements:   Reviewed by clinician on day of visit: allergies, medications, problem list, medical history, surgical history, family history, social history, and previous encounter notes.  I have reviewed the above documentation for accuracy and completeness, and I agree with the above. -  Sabas Frett d. Redell Bhandari, NP-C

## 2024-03-21 ENCOUNTER — Encounter: Payer: Self-pay | Admitting: Physical Therapy

## 2024-03-21 ENCOUNTER — Ambulatory Visit: Attending: Internal Medicine | Admitting: Physical Therapy

## 2024-03-21 VITALS — BP 125/77 | HR 64

## 2024-03-21 DIAGNOSIS — R42 Dizziness and giddiness: Secondary | ICD-10-CM | POA: Diagnosis not present

## 2024-03-21 DIAGNOSIS — H8112 Benign paroxysmal vertigo, left ear: Secondary | ICD-10-CM | POA: Diagnosis not present

## 2024-03-21 NOTE — Therapy (Signed)
 OUTPATIENT PHYSICAL THERAPY VESTIBULAR EVALUATION     Patient Name: Mckenzie Hebert MRN: [id number] DOB:01/20/1956, 68 y.o., female Today's Date: 03/21/2024  END OF SESSION:  PT End of Session - 03/21/24 1018     Visit Number 1    Number of Visits 5    Date for PT Re-Evaluation 04/21/24    PT Start Time 1019    PT Stop Time 1105    PT Time Calculation (min) 46 min    Activity Tolerance Patient tolerated treatment well    Behavior During Therapy [facility] for tasks assessed/performed          Past Medical History:  Diagnosis Date   AC (acromioclavicular) joint bone spurs 2008   right foot   Anxiety    Asthma    Back pain    Bilateral swelling of feet    Constipation    Dyspnea    Dysrhythmia 2019   PVC   Endometrial polyp    Heartburn    History of colon polyps    Hypothyroidism    Joint pain    Migraines    Dr Malcom   Osteoarthritis    PAC (premature atrial contraction)    Palpitations    cardiologist-  dr hochrein-- normal myoview 2017, holter 2015 mild ectopy   PONV (postoperative nausea and vomiting)    Pre-diabetes 2019   PVC (premature ventricular contraction)    SOB (shortness of breath)    Thyroid  disease    Uterine fibroid    Vitamin D  deficiency    Past Surgical History:  Procedure Laterality Date   APPENDECTOMY     CARDIOVASCULAR STRESS TEST  10-11-2015   dr hochrein   normal nuclear study w/ no ischemia/  normal LV function and wall motion, nuclear stress ef 64%   D & C HYSTERSCOPY W/ RESECTION POLYP  01-16-2010   dr gottsegen  @ [facility]   DILATATION & CURETTAGE/HYSTEROSCOPY WITH MYOSURE N/A 06/07/2018   Procedure: DILATATION & CURETTAGE/HYSTEROSCOPY WITH MYOSURE;  Surgeon: Rockney Evalene SQUIBB, MD;  Location: [facility];  Service: Gynecology;  Laterality: N/A;  request to follow first case in [place] [facility] block time  requests one hour   FOOT SURGERY  2002, 2008   bi-lat , bone spurs, achilles tendon work   HAMMER TOE SURGERY   11/2018   RESECTION TUMOR CLAVICLE RADICAL  1985   benign   TONSILLECTOMY     TOTAL KNEE ARTHROPLASTY Left 12/09/2020   Procedure: TOTAL KNEE ARTHROPLASTY;  Surgeon: Melodi Lerner, MD;  Location: [facility];  Service: Orthopedics;  Laterality: Left;    Patient Active Problem List   Diagnosis Date Noted   History of colonic polyps 02/18/2024   Hypertension associated with type 2 diabetes mellitus (HCC) 11/11/2023   Healthcare maintenance 11/11/2023   Depression 10/20/2021   Primary osteoarthritis of left knee 12/09/2020   Osteoarthritis of left knee 08/16/2018   BMI 40.0-44.9, adult (HCC) 01/14/2018   Trochanteric bursitis of right hip 11/03/2017   Chest pain 10/07/2015   Hypothyroidism 05/14/2015   Palpitation 07/17/2014   Type 2 diabetes mellitus with hyperglycemia, without long-term current use of insulin  (HCC) 11/05/2013   Obesity, unspecified 11/05/2013   Hx of migraine headaches 12/07/2011   Urticaria 04/04/2011   Asthma 04/04/2011   Anxiety 04/04/2011   Vitamin D  deficiency 04/04/2011   Hyperplastic colon polyp 04/04/2011   GE reflux 04/04/2011   Insomnia 04/04/2011    PCP: [name], MD  REFERRING PROVIDER: Perri,  Ronal PARAS, MD   REFERRING DIAG: [phone] (ICD-10-CM) - Vertigo  THERAPY DIAG:  Dizziness and giddiness  BPPV (benign paroxysmal positional vertigo), left  ONSET DATE: July 2nd, 2025  Rationale for Evaluation and Treatment: Rehabilitation  SUBJECTIVE:   SUBJECTIVE STATEMENT: MD increased Ozempic  dosage on June 2nd, 2025 and believed to result in subsequent vertigo. July as holding onto sink and stumbling. Walked with a cane for a week as she felt unsteady. When lays on L side she feels dizzy but not on right.  Pt accompanied by: friend; stayed in waiting room  PERTINENT HISTORY: generalized anxiety, migraines, PAC, asthma, vitamin D  deficiency, hypothyroidism, dyspnea   PAIN:  Are you having pain? No  PRECAUTIONS: None  RED  FLAGS: None   WEIGHT BEARING RESTRICTIONS: No  FALLS: Has patient fallen in last 6 months? Yes. Number of falls 1; June 3rd-4th she had to furniture surf and fell into a chair - occurred at night  LIVING ENVIRONMENT: Lives with: lives alone Lives in: House/apartment Has following equipment at home: Single point cane; just uses in the house  PLOF: Independent  PATIENT GOALS: not feel dizzy  OBJECTIVE:  Note: Objective measures were completed at Evaluation unless otherwise noted.  DIAGNOSTIC FINDINGS: unremarkable  COGNITION: Overall cognitive status: Within functional limits for tasks assessed   SENSATION: WFL  Cervical ROM:  grossly limited  STRENGTH:  WFL  VESTIBULAR ASSESSMENT:  GENERAL OBSERVATION: no AD, no signs of distress or apprehension, rounded shoulders    SYMPTOM BEHAVIOR:  Subjective history: see above   Non-Vestibular symptoms: wears reading glasses  Type of dizziness: loopy  Frequency: daily  Duration: lasts for 1-2 seconds  Aggravating factors: Induced by position change: lying supine, rolling to the right, and supine to sit  Relieving factors: no known relieving factors  Progression of symptoms: better  OCULOMOTOR EXAM:  Ocular Alignment: normal  Ocular ROM: No Limitations  Spontaneous Nystagmus: absent  Gaze-Induced Nystagmus: absent  Smooth Pursuits: intact  Saccades: intact  Convergence/Divergence: WNL  VESTIBULAR - OCULAR REFLEX:   Slow VOR: Normal  VOR Cancellation: Normal  Head-Impulse Test: Normal  Dynamic Visual Acuity: deferred to next session - if indicated   POSITIONAL TESTING: Right Dix-Hallpike: no nystagmus Left Dix-Hallpike: upbeating, left nystagmus and Duration: 15-20 seconds  MOTION SENSITIVITY:  Motion Sensitivity Quotient Intensity: 0 = none, 1 = Lightheaded, 2 = Mild, 3 = Moderate, 4 = Severe, 5 = Vomiting  Intensity  1. Sitting to supine   2. Supine to L side   3. Supine to R side   4. Supine to sitting    5. L Hallpike-Dix   6. Up from L    7. R Hallpike-Dix   8. Up from R    9. Sitting, head tipped to L knee   10. Head up from L knee   11. Sitting, head tipped to R knee   12. Head up from R knee   13. Sitting head turns x5   14.Sitting head nods x5   15. In stance, 180 turn to L    16. In stance, 180 turn to R     OTHOSTATICS: BP WNL  TREATMENT DATE: 03/21/24 Today's Vitals   03/21/24 1034 03/21/24 1102  BP: 136/79 125/77  Pulse: 69 64  *sitting (start of session); sitting (end of session) Education on the pathophysiology of BPPV and the role PT plays in management of sx Discussion surrounding the purpose of Meclizine and how it suppresses the vestibular system PT utilized inner ear model, explaining the difference between the anterior, posterior, and horizontal canals and their respective crystals  Canalith Repositioning:  Epley Left: Number of Reps: 1, Response to Treatment: symptoms improved, and Comment: nystagmus was noted and pt was symptomatic; PT re-checked with Dix-hallpike 2x: no nystagmus noted and pt was asymptomatic, however felt dizzy upon returning to tall sitting position  PATIENT EDUCATION: Education details: see above, clinical findings, POC, importance of hydration following treatment, optimal sleeping positions to prevent displacement of crystals (avoid sleeping on L side) Person educated: Patient Education method: Medical illustrator Education comprehension: verbalized understanding and needs further education  HOME EXERCISE PROGRAM:  GOALS: Goals reviewed with patient? Yes  SHORT TERM GOALS: All STGs = LTGs  LONG TERM GOALS: Target date: 04/21/24  Pt will demonstrate (-) positional testing to indicate resolution of BPPV. Baseline:  Goal status: INITIAL  1.  Pt will be independent and compliant with final HEP in order  to maintain functional progress and improve mobility. Baseline:  Goal status: INITIAL  3.  DVA goal to be written once assessed - if indicated.  Baseline:  Goal status: INITIAL  ASSESSMENT:  CLINICAL IMPRESSION: Patient is a 68 year old female referred to Neuro OPPT for dizziness. Pt's PMH is significant for: generalized anxiety, migraines, PAC, asthma, vitamin D  deficiency, hypothyroidism, dyspnea. Given the short duration of the pt's dizziness episodes along sx being brought on solely by positional changes in bed, PT highly suspects posterior canal BPPV. Pt exhibited nystagmus with L Dix-hallpike and PT proceeded to perform Epley maneuver x1: nystagmus was noted and pt was symptomatic. Re-checked with Dix-hallpike 2x: no nystagmus noted and pt was asymptomatic, however felt dizzy upon returning to tall sitting position. Pt required [facility] and PT-assistance upon clinic departure d/t dizziness; left in waiting room with friend. PT instructed the pt to stay well-hydrated the rest of the day and to avoid sleeping on her L side for the next couple of days; pt verbalized understanding and agreeable. The following deficits were present during the exam: limited cervical ROM, guarding with head movements, and generalized anxiety. Based on dizziness and current functional status, pt falls below self-reported baseline (prior to July 2nd, 2025), pt is an incr risk for falls. Pt would benefit from skilled PT to address these impairments and functional limitations to return to PLOF and improve QoL.  OBJECTIVE IMPAIRMENTS: decreased balance, decreased knowledge of condition, and dizziness.   ACTIVITY LIMITATIONS: bed mobility  PARTICIPATION LIMITATIONS: interpersonal relationship, shopping, and community activity  PERSONAL FACTORS: Age, Past/current experiences, and 1-2 comorbidities: migraines, generalized anxiety are also affecting patient's functional outcome.   REHAB POTENTIAL: Good  CLINICAL DECISION  MAKING: Evolving/moderate complexity  EVALUATION COMPLEXITY: Moderate   PLAN:  PT FREQUENCY: 1-2x/week  PT DURATION: 4 weeks  PLANNED INTERVENTIONS: 97164- PT Re-evaluation, 97750- Physical Performance Testing, 97110-Therapeutic exercises, 97530- Therapeutic activity, 97112- Neuromuscular re-education, 97535- Self Care, [id number]- Manual therapy, [phone]- Canalith repositioning, Patient/Family education, Balance training, Stair training, Vestibular training, Visual/preceptual remediation/compensation, and Cognitive remediation  PLAN FOR NEXT SESSION: re-check for L posterior canal BPPV using the Epley maneuver. Provide further BPPV patient education.   Waddell Nailer, Student-PT 03/21/2024,  3:42 PM

## 2024-03-24 ENCOUNTER — Encounter: Payer: Self-pay | Admitting: Physical Therapy

## 2024-04-02 NOTE — Progress Notes (Unsigned)
 Cardiology Office Note:    Date:  04/06/2024   ID:  Mckenzie Hebert, DOB May 08, 1956, MRN [id number]  PCP:  [name], MD  Cardiologist:  None  Electrophysiologist:  None   Referring MD: [name], MD   Chief Complaint  Patient presents with   Dizziness     History of Present Illness:    Mckenzie Hebert is a 68 y.o. female with a hx of hypothyroidism, prediabetes, frequent PACs/PVCs who presents for follow-up.  She was referred by Dr. Perri for evaluation of near syncopal episode, initially seen on 11/15/2020.  She works as a Community education officer and reports that on 10/10/2020 she was sitting down and felt lightheaded.  She denies any chest pain, dyspnea, palpitations.  States that she felt she was going to pass out, but turned her fan on and started taking deep breaths and symptoms improved.  She reports she lost 50 pounds from June to November last year.  Reports occasional chest pain, which she states occurs a couple times per year and describes as dull aching pain in the center of her chest that can last up to 30 minutes.  She denies any exertional chest pain.  States that she can walk up a flight of stairs without stopping, denies any chest pain or shortness of breath with this.  She smoked in her teens and 78s.  Family history includes mother had CABG and CHF, died during a nuclear stress test.  Lexiscan Myoview on 10/11/2015 showed normal perfusion, EF 64%.  Echocardiogram on 11/29/2020 showed normal biventricular function, no significant valvular disease.  Zio patch x14 days on 01/30/2021 showed frequent PVCs (10% of beats), frequent PACs (10%), 31 episodes of SVT with longest lasting 14 seconds.  Calcium  score 353 (94th percentile) on 01/30/2021.  Repeat Zio patch x3 days on metoprolol  showed 4 episodes of SVT (longest lasting 7 beats), frequent PACs (15% of beats), occasional PVCs (4% of beats).  Since last clinic visit, she reports she is doing okay.  Had recent episode of  vertigo, underwent vestibular rehabilitation and is improving.  Did have 1 syncopal episode that occurred when she bent over and briefly lost consciousness.  She denies any chest pain, dyspnea, or palpitations.  Does report some lower extremity edema.  She stopped taking Ozempic  because felt it was contributing to her lightheadedness.    Wt Readings from Last 3 Encounters:  04/06/24 275 lb (124.7 kg)  03/20/24 268 lb (121.6 kg)  03/16/24 270 lb (122.5 kg)   BP Readings from Last 3 Encounters:  04/06/24 110/60  04/03/24 123/84  03/21/24 125/77       Past Medical History:  Diagnosis Date   AC (acromioclavicular) joint bone spurs 2008   right foot   Anxiety    Asthma    Back pain    Bilateral swelling of feet    Constipation    Dyspnea    Dysrhythmia 2019   PVC   Endometrial polyp    Heartburn    History of colon polyps    Hypothyroidism    Joint pain    Migraines    Dr Malcom   Osteoarthritis    PAC (premature atrial contraction)    Palpitations    cardiologist-  dr hochrein-- normal myoview 2017, holter 2015 mild ectopy   PONV (postoperative nausea and vomiting)    Pre-diabetes 2019   PVC (premature ventricular contraction)    SOB (shortness of breath)    Thyroid  disease    Uterine  fibroid    Vitamin D  deficiency     Past Surgical History:  Procedure Laterality Date   APPENDECTOMY     CARDIOVASCULAR STRESS TEST  10-11-2015   dr hochrein   normal nuclear study w/ no ischemia/  normal LV function and wall motion, nuclear stress ef 64%   D & C HYSTERSCOPY W/ RESECTION POLYP  01-16-2010   dr gottsegen  @ [facility]   DILATATION & CURETTAGE/HYSTEROSCOPY WITH MYOSURE N/A 06/07/2018   Procedure: DILATATION & CURETTAGE/HYSTEROSCOPY WITH MYOSURE;  Surgeon: Rockney Evalene SQUIBB, MD;  Location:  [facility];  Service: Gynecology;  Laterality: N/A;  request to follow first case in [place] [facility] block time  requests one hour   FOOT SURGERY  2002, 2008   bi-lat ,  bone spurs, achilles tendon work   HAMMER TOE SURGERY  11/2018   RESECTION TUMOR CLAVICLE RADICAL  1985   benign   TONSILLECTOMY     TOTAL KNEE ARTHROPLASTY Left 12/09/2020   Procedure: TOTAL KNEE ARTHROPLASTY;  Surgeon: Melodi Lerner, MD;  Location: [facility];  Service: Orthopedics;  Laterality: Left;     Current Medications: Current Meds  Medication Sig   albuterol  (VENTOLIN  HFA) 108 (90 Base) MCG/ACT inhaler INHALE 2 PUFFS BY MOUTH EVERY 6 HOURS AS NEEDED   ALPRAZolam  (XANAX ) 1 MG tablet TAKE 1 TABLET BY MOUTH AS NEEDED FOR SLEEP   blood glucose meter kit and supplies KIT 1 each by Does not apply route daily as needed. Check glucose daily before breakfast and supper.   Blood Glucose Monitoring Suppl (TRUE METRIX AIR GLUCOSE METER) DEVI 1 Device by Does not apply route daily. E11.65   docusate sodium  (COLACE) 100 MG capsule Take 100 mg by mouth 2 (two) times daily.   ezetimibe  (ZETIA ) 10 MG tablet Take 10 mg by mouth daily.   glucose blood test strip USE TO TEST BLOOD GLUCOSE TWICE DAILY BEFORE BREAKFAST AND SUPPER.   Lancets 28G MISC 1 each by Does not apply route daily. DX E11.65   levothyroxine  (SYNTHROID ) 150 MCG tablet TAKE 1 TABLET BY MOUTH EVERY DAY   losartan  (COZAAR ) 25 MG tablet TAKE 1 TABLET (25 MG TOTAL) BY MOUTH DAILY.   metFORMIN  (GLUCOPHAGE ) 500 MG tablet Take 1 tablet (500 mg total) by mouth 2 (two) times daily with a meal.   metoprolol  succinate (TOPROL -XL) 25 MG 24 hr tablet TAKE 1 AND 1/2 TABLETS DAILY BY MOUTH   ondansetron  (ZOFRAN ) 4 MG tablet Take 1 tablet (4 mg total) by mouth every 8 (eight) hours as needed for nausea or vomiting.   pantoprazole  (PROTONIX ) 40 MG tablet TAKE 1 TABLET BY MOUTH EVERY DAY   rosuvastatin  (CRESTOR ) 10 MG tablet TAKE 1 TABLET BY MOUTH EVERY DAY   UNABLE TO FIND USE TO CECK BLOOD GLUCOSE TWICE DAILY BEFORE BREAKFAST AND SUPPER.   Vitamin D , Ergocalciferol , (DRISDOL ) 1.25 MG (50000 UNIT) CAPS capsule Take 1 capsule (50,000 Units total)  by mouth every 7 (seven) days.     Allergies:   Percocet [oxycodone -acetaminophen ]   Social History   Socioeconomic History   Marital status: Widowed    Spouse name: Not on file   Number of children: Not on file   Years of education: Not on file   Highest education level: Not on file  Occupational History   Occupation: Histology    Employer:  [employer]  Tobacco Use   Smoking status: Former    Current packs/day: 0.00    Types: Cigarettes    Quit  date: 29    Years since quitting: 52.6   Smokeless tobacco: Never  Vaping Use   Vaping status: Never Used  Substance and Sexual Activity   Alcohol use: No    Alcohol/week: 0.0 standard drinks of alcohol   Drug use: No   Sexual activity: Not Currently    Comment: INTERCOURSE AGE 42, SEXUAL PARTNERS LESS THAN 5  Other Topics Concern   Not on file  Social History Narrative   Lives alone.     Social Drivers of Corporate investment banker Strain: Not on file  Food Insecurity: No Food Insecurity (04/13/2023)   Hunger Vital Sign    Worried About Running Out of Food in the Last Year: Never true    Ran Out of Food in the Last Year: Never true  Transportation Needs: Not on file  Physical Activity: Not on file  Stress: Not on file  Social Connections: Not on file     Family History: The patient'sfamily history includes Alcohol abuse in her father; Autoimmune disease in her sister; Breast cancer in her paternal aunt; Cancer in her father; Depression in her father; Diabetes in her mother and sister; Heart disease (age of onset: 54) in her mother; Hyperlipidemia in her mother; Hypertension in her mother, sister, and sister; Leukemia in her paternal grandmother; Mental retardation in an other family member; Stroke in her mother; Thyroid  disease in her sister.  ROS:   Please see the history of present illness.    All other systems reviewed and are negative.  EKGs/Labs/Other Studies Reviewed:    The following studies were  reviewed today:   EKG:   08/26/2022: Normal sinus rhythm, incomplete right bundle branch block, right axis deviation, rate 68 09/02/2023: Sinus rhythm with PACs, incomplete right bundle branch block, low voltage 04/06/2024: Normal sinus rhythm with PACs, rate 69, incomplete right bundle branch block  Recent Labs: 04/08/2023: Hemoglobin 12.6; Platelets 243 10/15/2023: TSH 2.63 02/17/2024: ALT 23; BUN 16; Creatinine, Ser 0.69; Potassium 4.7; Sodium 138  Recent Lipid Panel    Component Value Date/Time   CHOL 107 10/15/2023 0929   CHOL 132 07/04/2021 1609   TRIG 88 10/15/2023 0929   HDL 52 10/15/2023 0929   HDL 63 07/04/2021 1609   CHOLHDL 2.1 10/15/2023 0929   VLDL 17 05/12/2016 0933   LDLCALC 38 10/15/2023 0929    Physical Exam:    VS:  BP 110/60 (BP Location: Left Arm, Patient Position: Sitting, Cuff Size: Large)   Pulse 69   Ht 5' 7 (1.702 m)   Wt 275 lb (124.7 kg)   LMP 03/18/2008 (LMP Unknown)   SpO2 96%   BMI 43.07 kg/m     Wt Readings from Last 3 Encounters:  04/06/24 275 lb (124.7 kg)  03/20/24 268 lb (121.6 kg)  03/16/24 270 lb (122.5 kg)     GEN: Well nourished, well developed in no acute distress HEENT: Normal NECK: No JVD; No carotid bruits CARDIAC: Irregular, normal rate, no murmurs RESPIRATORY:  Clear to auscultation without rales, wheezing or rhonchi  ABDOMEN: Soft, non-tender, non-distended MUSCULOSKELETAL:  No edema; No deformity  SKIN: Warm and dry NEUROLOGIC:  Alert and oriented x 3 PSYCHIATRIC:  Normal affect   ASSESSMENT:    1. Syncope and collapse   2. PVC's (premature ventricular contractions)   3. Essential hypertension   4. Dilatation of aorta (HCC)   5. Morbid obesity (HCC)   6. Hyperlipidemia, unspecified hyperlipidemia type       PLAN:  Near syncope: Unclear cause.  Echocardiogram on 11/29/2020 showed normal biventricular function, no significant valvular disease.  Zio patch x14 days on 01/30/2021 showed frequent PVCs (10% of  beats), frequent PACs (10%), 31 episodes of SVT with longest lasting 14 seconds.   -Continue Toprol -XL -Reports recent possible syncopal episode that occurred during bout of vertigo, which has resolved with vestibular rehabilitation therapy.  Recommend echocardiogram to rule out any structural heart disease  Frequent PACs/PVCs: Zio patch x14 days on 01/30/2021 showed frequent PVCs (10% of beats), frequent PACs (10%), 31 episodes of SVT with longest lasting 14 seconds.  Repeat Zio patch x3 days on metoprolol   in 07/2021 showed 4 episodes of SVT (longest lasting 7 beats), frequent PACs (15% of beats), occasional PVCs (4% of beats). -Continue Toprol -XL.  She reported significant fatigue on 37.5 mg daily, reduced dose back to 25 mg daily  Hyperlipidemia: On Zetia  10 mg daily.  LDL 127 on 01/28/21.  10-year ASCVD risk score 5%.  Calcium  score 353 (94th percentile) on 01/30/2021.  Started rosuvastatin  10 mg daily.  LDL 40 on 04/2023  Hypertension: On Toprol -XL 25 mg daily and losartan  25 mg daily.  Appears controlled  Dilated aorta: Dilated ascending aorta measured 41 mm on calcium  score 02/27/2021.  CTA chest 03/2022 showed no aortic aneurysm  Daytime somnolence: Sleep study 05/09/2021 showed mild OSA, no indication for CPAP at this time  Morbid obesity: Body mass index is 43.07 kg/m.  Previously lost 30 pounds following with healthy weight and wellness but has gained weight back.  Follows with healthy weight and wellness.  Was on Ozempic  but discontinued due to lightheadedness  Pulmonary nodule: 5 mm pulmonary nodule in CTA chest 03/2022.  Given lack of risk factors, no follow-up needed   RTC in 6 months   Medication Adjustments/Labs and Tests Ordered: Current medicines are reviewed at length with the patient today.  Concerns regarding medicines are outlined above.  Orders Placed This Encounter  Procedures   EKG 12-Lead   ECHOCARDIOGRAM COMPLETE    No orders of the defined types were placed in  this encounter.     Patient Instructions  Medication Instructions:  Continue current medications *If you need a refill on your cardiac medications before your next appointment, please call your pharmacy*  Lab Work: none If you have labs (blood work) drawn today and your tests are completely normal, you will receive your results only by: MyChart Message (if you have MyChart) OR A paper copy in the mail If you have any lab test that is abnormal or we need to change your treatment, we will call you to review the results.  Testing/Procedures: Echo  Your physician has requested that you have an echocardiogram. Echocardiography is a painless test that uses sound waves to create images of your heart. It provides your doctor with information about the size and shape of your heart and how well your heart's chambers and valves are working. This procedure takes approximately one hour. There are no restrictions for this procedure. Please do NOT wear cologne, perfume, aftershave, or lotions (deodorant is allowed). Please arrive 15 minutes prior to your appointment time.  Please note: We ask at that you not bring children with you during ultrasound (echo/ vascular) testing. Due to room size and safety concerns, children are not allowed in the ultrasound rooms during exams. Our front office staff cannot provide observation of children in our lobby area while testing is being conducted. An adult accompanying a patient to their appointment will only  be allowed in the ultrasound room at the discretion of the ultrasound technician under special circumstances. We apologize for any inconvenience.   Follow-Up: At [facility], you and your health needs are our priority.  As part of our continuing mission to provide you with exceptional heart care, our providers are all part of one team.  This team includes your primary Cardiologist (physician) and Advanced Practice Providers or APPs (Physician  Assistants and Nurse Practitioners) who all work together to provide you with the care you need, when you need it.  Your next appointment:   6 month(s)  Provider:   Dr. Kate We recommend signing up for the patient portal called MyChart.  Sign up information is provided on this After Visit Summary.  MyChart is used to connect with patients for Virtual Visits (Telemedicine).  Patients are able to view lab/test results, encounter notes, upcoming appointments, etc.  Non-urgent messages can be sent to your provider as well.   To learn more about what you can do with MyChart, go to [email or web address].   Other Instructions none       Signed, Lonni LITTIE Kate, MD  04/06/2024 5:42 PM    [facility] apnea you

## 2024-04-03 ENCOUNTER — Encounter: Payer: Self-pay | Admitting: Physical Therapy

## 2024-04-03 ENCOUNTER — Ambulatory Visit: Admitting: Physical Therapy

## 2024-04-03 VITALS — BP 123/84 | HR 65

## 2024-04-03 DIAGNOSIS — R42 Dizziness and giddiness: Secondary | ICD-10-CM

## 2024-04-03 DIAGNOSIS — H8112 Benign paroxysmal vertigo, left ear: Secondary | ICD-10-CM

## 2024-04-03 NOTE — Therapy (Signed)
 OUTPATIENT PHYSICAL THERAPY VESTIBULAR TREATMENT     Patient Name: Mckenzie Hebert MRN: [id number] DOB:1955/12/13, 68 y.o., female Today's Date: 04/03/2024  END OF SESSION:  PT End of Session - 04/03/24 1008     Visit Number 2    Number of Visits 5    Date for PT Re-Evaluation 04/21/24    PT Start Time 1005    PT Stop Time 1048    PT Time Calculation (min) 43 min    Activity Tolerance Patient tolerated treatment well    Behavior During Therapy [facility] for tasks assessed/performed;Anxious          Past Medical History:  Diagnosis Date   AC (acromioclavicular) joint bone spurs 2008   right foot   Anxiety    Asthma    Back pain    Bilateral swelling of feet    Constipation    Dyspnea    Dysrhythmia 2019   PVC   Endometrial polyp    Heartburn    History of colon polyps    Hypothyroidism    Joint pain    Migraines    Dr Malcom   Osteoarthritis    PAC (premature atrial contraction)    Palpitations    cardiologist-  dr hochrein-- normal myoview 2017, holter 2015 mild ectopy   PONV (postoperative nausea and vomiting)    Pre-diabetes 2019   PVC (premature ventricular contraction)    SOB (shortness of breath)    Thyroid  disease    Uterine fibroid    Vitamin D  deficiency    Past Surgical History:  Procedure Laterality Date   APPENDECTOMY     CARDIOVASCULAR STRESS TEST  10-11-2015   dr hochrein   normal nuclear study w/ no ischemia/  normal LV function and wall motion, nuclear stress ef 64%   D & C HYSTERSCOPY W/ RESECTION POLYP  01-16-2010   dr gottsegen  @ [facility]   DILATATION & CURETTAGE/HYSTEROSCOPY WITH MYOSURE N/A 06/07/2018   Procedure: DILATATION & CURETTAGE/HYSTEROSCOPY WITH MYOSURE;  Surgeon: Rockney Evalene SQUIBB, MD;  Location: [facility];  Service: Gynecology;  Laterality: N/A;  request to follow first case in [place] [facility] block time  requests one hour   FOOT SURGERY  2002, 2008   bi-lat , bone spurs, achilles tendon work   HAMMER TOE  SURGERY  11/2018   RESECTION TUMOR CLAVICLE RADICAL  1985   benign   TONSILLECTOMY     TOTAL KNEE ARTHROPLASTY Left 12/09/2020   Procedure: TOTAL KNEE ARTHROPLASTY;  Surgeon: Melodi Lerner, MD;  Location: [facility];  Service: Orthopedics;  Laterality: Left;    Patient Active Problem List   Diagnosis Date Noted   History of colonic polyps 02/18/2024   Hypertension associated with type 2 diabetes mellitus (HCC) 11/11/2023   Healthcare maintenance 11/11/2023   Depression 10/20/2021   Primary osteoarthritis of left knee 12/09/2020   Osteoarthritis of left knee 08/16/2018   BMI 40.0-44.9, adult (HCC) 01/14/2018   Trochanteric bursitis of right hip 11/03/2017   Chest pain 10/07/2015   Hypothyroidism 05/14/2015   Palpitation 07/17/2014   Type 2 diabetes mellitus with hyperglycemia, without long-term current use of insulin  (HCC) 11/05/2013   Obesity, unspecified 11/05/2013   Hx of migraine headaches 12/07/2011   Urticaria 04/04/2011   Asthma 04/04/2011   Anxiety 04/04/2011   Vitamin D  deficiency 04/04/2011   Hyperplastic colon polyp 04/04/2011   GE reflux 04/04/2011   Insomnia 04/04/2011    PCP: [name], MD  REFERRING PROVIDER: Perri,  Ronal PARAS, MD   REFERRING DIAG: R42 (ICD-10-CM) - Vertigo  THERAPY DIAG:  BPPV (benign paroxysmal positional vertigo), left  Dizziness and giddiness  ONSET DATE: July 2nd, 2025  Rationale for Evaluation and Treatment: Rehabilitation  SUBJECTIVE:   SUBJECTIVE STATEMENT: After the evaluation for the rest of the day. Did not feel good until the next day. Got a ride to come to therapy today. Had to hold onto everything. Within 24 hours, felt good for 2 days. Laying down on her L side will sometimes make her dizzy. Went shopping the other day and didn't feel right walking. Tried the Epley 2 times and felt dizzy. Walking in with a cane today. Dizzy is sporadic here and there. When getting up from her recliner will have to stand for a second.  Has been trying to sleep on her R side. Notes this has been making her very anxious.   Pt accompanied by: friend; got dropped off   PERTINENT HISTORY: generalized anxiety, migraines, PAC, asthma, vitamin D  deficiency, hypothyroidism, dyspnea   PAIN:  Are you having pain? No  PRECAUTIONS: None  RED FLAGS: None   WEIGHT BEARING RESTRICTIONS: No  FALLS: Has patient fallen in last 6 months? Yes. Number of falls 1; June 3rd-4th she had to furniture surf and fell into a chair - occurred at night  LIVING ENVIRONMENT: Lives with: lives alone Lives in: House/apartment Has following equipment at home: Single point cane; just uses in the house  PLOF: Independent  PATIENT GOALS: not feel dizzy  OBJECTIVE:  Note: Objective measures were completed at Evaluation unless otherwise noted.  DIAGNOSTIC FINDINGS: unremarkable  COGNITION: Overall cognitive status: Within functional limits for tasks assessed   SENSATION: WFL  Cervical ROM:  grossly limited  STRENGTH:  WFL  VESTIBULAR ASSESSMENT:  GENERAL OBSERVATION: no AD, no signs of distress or apprehension, rounded shoulders    SYMPTOM BEHAVIOR:  Subjective history: see above   Non-Vestibular symptoms: wears reading glasses  Type of dizziness: loopy  Frequency: daily  Duration: lasts for 1-2 seconds  Aggravating factors: Induced by position change: lying supine, rolling to the right, and supine to sit  Relieving factors: no known relieving factors  Progression of symptoms: better  OCULOMOTOR EXAM:  Ocular Alignment: normal  Ocular ROM: No Limitations  Spontaneous Nystagmus: absent  Gaze-Induced Nystagmus: absent  Smooth Pursuits: intact  Saccades: intact  Convergence/Divergence: WNL  VESTIBULAR - OCULAR REFLEX:   Slow VOR: Normal  VOR Cancellation: Normal  Head-Impulse Test: Normal  Dynamic Visual Acuity: deferred to next session - if indicated   POSITIONAL TESTING: Right Dix-Hallpike: no nystagmus Left  Dix-Hallpike: upbeating, left nystagmus and Duration: 15-20 seconds  MOTION SENSITIVITY:  Motion Sensitivity Quotient Intensity: 0 = none, 1 = Lightheaded, 2 = Mild, 3 = Moderate, 4 = Severe, 5 = Vomiting  Intensity  1. Sitting to supine   2. Supine to L side   3. Supine to R side   4. Supine to sitting   5. L Hallpike-Dix   6. Up from L    7. R Hallpike-Dix   8. Up from R    9. Sitting, head tipped to L knee   10. Head up from L knee   11. Sitting, head tipped to R knee   12. Head up from R knee   13. Sitting head turns x5   14.Sitting head nods x5   15. In stance, 180 turn to L    16. In stance, 180 turn to R  OTHOSTATICS: BP WNL                                                                                                                           TREATMENT DATE: 04/03/24  Therapeutic Activity:  Today's Vitals   04/03/24 1020  BP: 123/84  Pulse: 65    Reviewed education on the pathophysiology of BPPV, role of PT in treatment of BPPV, and symptoms/causes of BPPV  Discussed vestibular suppressant medications - pt currently taking Xanax  at night due to anxiety, not necessarily because of dizziness   POSITIONAL TESTING: Right Dix-Hallpike: no nystagmus Left Dix-Hallpike: very mild L upbeating rotary nystagmus, but pt reporting no symptoms, did have incr dizziness when coming upright  Right Roll Test: no nystagmus Left Roll Test: pt reporting some lightheadedness, mild L upbeating rotary nystagmus  Right Sidelying: no nystagmus Left Sidelying: no nystagmus and pt reporting dizziness when coming upright   When going to treat with L Epley maneuver, pt with no nystagmus/dizziness in that position. Re-assessed again with L loaded DixHallpike with no nystagmus/dizziness. Just feeling lightheaded when coming up.   Access Code: [facility] URL: [email or web address] Date: 04/03/2024 Prepared by: Sheffield Senate  Exercises - Brandt-Daroff Vestibular Exercise   - 1 x daily - 7 x weekly - 2 sets - 4-5 reps  Provided Wilhelmena Carrel for habituation as pt still with some feelings of lightheadedness when coming upright. No nystagmus noted in either position. Performed 3 reps each side, with pt reporting a slight improvement in lightheaded feeling.   PATIENT EDUCATION: Education details: see above, clinical findings, Wilhelmena Daroff exercises to HEP, discussed pt's L posterior canal BPPV appears to be cleared at this time  Person educated: Patient Education method: Explanation, Demonstration, and Handouts Education comprehension: verbalized understanding, returned demonstration, and needs further education  HOME EXERCISE PROGRAM: Access Code: [facility] URL: [email or web address] Date: 04/03/2024 Prepared by: Sheffield Senate  Exercises - Brandt-Daroff Vestibular Exercise  - 1 x daily - 7 x weekly - 2 sets - 4-5 reps  GOALS: Goals reviewed with patient? Yes  SHORT TERM GOALS: All STGs = LTGs  LONG TERM GOALS: Target date: 04/21/24  Pt will demonstrate (-) positional testing to indicate resolution of BPPV. Baseline:  Goal status: INITIAL  1.  Pt will be independent and compliant with final HEP in order to maintain functional progress and improve mobility. Baseline:  Goal status: INITIAL  3.  DVA goal to be written once assessed - if indicated.  Baseline:  Goal status: INITIAL  ASSESSMENT:  CLINICAL IMPRESSION: Pt returns from eval due to continued feelings of dizziness. After eval when treated with the Epley maneuver, pt felt pretty dizzy for 24 hours. Pt comes in with a SPC today to help with confidence/stability during ambulation. Pt is very anxious regarding her dizziness, and PT provided extensive education regarding BPPV and findings during previous evaluation. Re-assessed BPPV today with pt appearing to have slight L upbeating rotary nystagmus in L roll and L DixHallpike,  but pt not reporting a spinning sensation. Re-checked  with L DixHallpike/Loaded DixHallpike to L side with no nystagmus noted and pt not dizzy in position. Did not treat with Epley maneuver. Provided pt with Wilhelmena Carrel exercises for habituation as pt did report a slight lightheadedness with return to upright. Pt did report some improvement in lightheadedness with incr reps. Gave as HEP. Will continue per POC.    OBJECTIVE IMPAIRMENTS: decreased balance, decreased knowledge of condition, and dizziness.   ACTIVITY LIMITATIONS: bed mobility  PARTICIPATION LIMITATIONS: interpersonal relationship, shopping, and community activity  PERSONAL FACTORS: Age, Past/current experiences, and 1-2 comorbidities: migraines, generalized anxiety are also affecting patient's functional outcome.   REHAB POTENTIAL: Good  CLINICAL DECISION MAKING: Evolving/moderate complexity  EVALUATION COMPLEXITY: Moderate   PLAN:  PT FREQUENCY: 1-2x/week  PT DURATION: 4 weeks  PLANNED INTERVENTIONS: 97164- PT Re-evaluation, 97750- Physical Performance Testing, 97110-Therapeutic exercises, 97530- Therapeutic activity, 97112- Neuromuscular re-education, 97535- Self Care, [id number]- Manual therapy, [phone]- Canalith repositioning, Patient/Family education, Balance training, Stair training, Vestibular training, Visual/preceptual remediation/compensation, and Cognitive remediation  PLAN FOR NEXT SESSION: how were brandt daroff? Re check BPPV and treat as needed. Further look at balance/DVA if indicated and give any other exercises?    Sheffield LOISE Senate, PT, DPT 04/03/2024, 10:52 AM

## 2024-04-06 ENCOUNTER — Encounter: Payer: Self-pay | Admitting: Cardiology

## 2024-04-06 ENCOUNTER — Ambulatory Visit: Attending: Cardiology | Admitting: Cardiology

## 2024-04-06 VITALS — BP 110/60 | HR 69 | Ht 67.0 in | Wt 275.0 lb

## 2024-04-06 DIAGNOSIS — R55 Syncope and collapse: Secondary | ICD-10-CM | POA: Diagnosis not present

## 2024-04-06 DIAGNOSIS — I77819 Aortic ectasia, unspecified site: Secondary | ICD-10-CM | POA: Diagnosis not present

## 2024-04-06 DIAGNOSIS — E785 Hyperlipidemia, unspecified: Secondary | ICD-10-CM | POA: Diagnosis not present

## 2024-04-06 DIAGNOSIS — I493 Ventricular premature depolarization: Secondary | ICD-10-CM

## 2024-04-06 DIAGNOSIS — I10 Essential (primary) hypertension: Secondary | ICD-10-CM | POA: Diagnosis not present

## 2024-04-06 NOTE — Patient Instructions (Signed)
 Medication Instructions:  Continue current medications *If you need a refill on your cardiac medications before your next appointment, please call your pharmacy*  Lab Work: none If you have labs (blood work) drawn today and your tests are completely normal, you will receive your results only by: MyChart Message (if you have MyChart) OR A paper copy in the mail If you have any lab test that is abnormal or we need to change your treatment, we will call you to review the results.  Testing/Procedures: Echo  Your physician has requested that you have an echocardiogram. Echocardiography is a painless test that uses sound waves to create images of your heart. It provides your doctor with information about the size and shape of your heart and how well your heart's chambers and valves are working. This procedure takes approximately one hour. There are no restrictions for this procedure. Please do NOT wear cologne, perfume, aftershave, or lotions (deodorant is allowed). Please arrive 15 minutes prior to your appointment time.  Please note: We ask at that you not bring children with you during ultrasound (echo/ vascular) testing. Due to room size and safety concerns, children are not allowed in the ultrasound rooms during exams. Our front office staff cannot provide observation of children in our lobby area while testing is being conducted. An adult accompanying a patient to their appointment will only be allowed in the ultrasound room at the discretion of the ultrasound technician under special circumstances. We apologize for any inconvenience.   Follow-Up: At [facility], you and your health needs are our priority.  As part of our continuing mission to provide you with exceptional heart care, our providers are all part of one team.  This team includes your primary Cardiologist (physician) and Advanced Practice Providers or APPs (Physician Assistants and Nurse Practitioners) who all work  together to provide you with the care you need, when you need it.  Your next appointment:   6 month(s)  Provider:   Dr. Kate We recommend signing up for the patient portal called MyChart.  Sign up information is provided on this After Visit Summary.  MyChart is used to connect with patients for Virtual Visits (Telemedicine).  Patients are able to view lab/test results, encounter notes, upcoming appointments, etc.  Non-urgent messages can be sent to your provider as well.   To learn more about what you can do with MyChart, go to [email or web address].   Other Instructions none

## 2024-04-14 ENCOUNTER — Other Ambulatory Visit: Payer: Medicare HMO

## 2024-04-14 DIAGNOSIS — F419 Anxiety disorder, unspecified: Secondary | ICD-10-CM

## 2024-04-14 DIAGNOSIS — E039 Hypothyroidism, unspecified: Secondary | ICD-10-CM

## 2024-04-14 DIAGNOSIS — H8110 Benign paroxysmal vertigo, unspecified ear: Secondary | ICD-10-CM | POA: Diagnosis not present

## 2024-04-14 DIAGNOSIS — Z6839 Body mass index (BMI) 39.0-39.9, adult: Secondary | ICD-10-CM | POA: Diagnosis not present

## 2024-04-14 DIAGNOSIS — E1169 Type 2 diabetes mellitus with other specified complication: Secondary | ICD-10-CM | POA: Diagnosis not present

## 2024-04-14 DIAGNOSIS — E559 Vitamin D deficiency, unspecified: Secondary | ICD-10-CM | POA: Diagnosis not present

## 2024-04-14 DIAGNOSIS — E1165 Type 2 diabetes mellitus with hyperglycemia: Secondary | ICD-10-CM

## 2024-04-14 DIAGNOSIS — I152 Hypertension secondary to endocrine disorders: Secondary | ICD-10-CM | POA: Diagnosis not present

## 2024-04-14 DIAGNOSIS — I10 Essential (primary) hypertension: Secondary | ICD-10-CM

## 2024-04-14 DIAGNOSIS — E66812 Obesity, class 2: Secondary | ICD-10-CM | POA: Diagnosis not present

## 2024-04-14 DIAGNOSIS — M1711 Unilateral primary osteoarthritis, right knee: Secondary | ICD-10-CM

## 2024-04-14 DIAGNOSIS — Z0000 Encounter for general adult medical examination without abnormal findings: Secondary | ICD-10-CM

## 2024-04-14 DIAGNOSIS — E1159 Type 2 diabetes mellitus with other circulatory complications: Secondary | ICD-10-CM | POA: Diagnosis not present

## 2024-04-14 DIAGNOSIS — E785 Hyperlipidemia, unspecified: Secondary | ICD-10-CM | POA: Diagnosis not present

## 2024-04-15 LAB — CBC WITH DIFFERENTIAL/PLATELET
Absolute Lymphocytes: 1009 {cells}/uL (ref 850–3900)
Absolute Monocytes: 317 {cells}/uL (ref 200–950)
Basophils Absolute: 31 {cells}/uL (ref 0–200)
Basophils Relative: 0.6 %
Eosinophils Absolute: 172 {cells}/uL (ref 15–500)
Eosinophils Relative: 3.3 %
HCT: 38 % (ref 35.0–45.0)
Hemoglobin: 12.3 g/dL (ref 11.7–15.5)
MCH: 27.3 pg (ref 27.0–33.0)
MCHC: 32.4 g/dL (ref 32.0–36.0)
MCV: 84.3 fL (ref 80.0–100.0)
MPV: 9.6 fL (ref 7.5–12.5)
Monocytes Relative: 6.1 %
Neutro Abs: 3671 {cells}/uL (ref 1500–7800)
Neutrophils Relative %: 70.6 %
Platelets: 237 Thousand/uL (ref 140–400)
RBC: 4.51 Million/uL (ref 3.80–5.10)
RDW: 13.6 % (ref 11.0–15.0)
Total Lymphocyte: 19.4 %
WBC: 5.2 Thousand/uL (ref 3.8–10.8)

## 2024-04-15 LAB — LIPID PANEL
Cholesterol: 111 mg/dL (ref <200)
HDL: 58 mg/dL (ref >=50)
LDL Cholesterol (Calc): 37 mg/dL
Non-HDL Cholesterol (Calc): 53 mg/dL (ref <130)
Total CHOL/HDL Ratio: 1.9 (calc) (ref <5.0)
Triglycerides: 81 mg/dL (ref <150)

## 2024-04-15 LAB — COMPLETE METABOLIC PANEL WITHOUT GFR
AG Ratio: 1.8 (calc) (ref 1.0–2.5)
ALT: 20 U/L (ref 6–29)
AST: 16 U/L (ref 10–35)
Albumin: 4.2 g/dL (ref 3.6–5.1)
Alkaline phosphatase (APISO): 52 U/L (ref 37–153)
BUN: 12 mg/dL (ref 7–25)
CO2: 29 mmol/L (ref 20–32)
Calcium: 9 mg/dL (ref 8.6–10.4)
Chloride: 104 mmol/L (ref 98–110)
Creat: 0.61 mg/dL (ref 0.50–1.05)
Globulin: 2.4 g/dL (ref 1.9–3.7)
Glucose, Bld: 140 mg/dL — ABNORMAL HIGH (ref 65–99)
Potassium: 4.5 mmol/L (ref 3.5–5.3)
Sodium: 139 mmol/L (ref 135–146)
Total Bilirubin: 0.5 mg/dL (ref 0.2–1.2)
Total Protein: 6.6 g/dL (ref 6.1–8.1)

## 2024-04-15 LAB — TSH: TSH: 1.62 m[IU]/L (ref 0.40–4.50)

## 2024-04-15 LAB — HEMOGLOBIN A1C
Hgb A1c MFr Bld: 6.8 % — ABNORMAL HIGH (ref <5.7)
Mean Plasma Glucose: 148 mg/dL
eAG (mmol/L): 8.2 mmol/L

## 2024-04-15 LAB — MICROALBUMIN / CREATININE URINE RATIO
Creatinine, Urine: 87 mg/dL (ref 20–275)
Microalb, Ur: <0.2 mg/dL

## 2024-04-17 ENCOUNTER — Encounter: Payer: Self-pay | Admitting: Internal Medicine

## 2024-04-17 ENCOUNTER — Ambulatory Visit (INDEPENDENT_AMBULATORY_CARE_PROVIDER_SITE_OTHER): Payer: Medicare HMO | Admitting: Internal Medicine

## 2024-04-17 VITALS — BP 120/80 | HR 64 | Ht 67.0 in | Wt 274.0 lb

## 2024-04-17 DIAGNOSIS — Z0000 Encounter for general adult medical examination without abnormal findings: Secondary | ICD-10-CM | POA: Diagnosis not present

## 2024-04-17 DIAGNOSIS — E1169 Type 2 diabetes mellitus with other specified complication: Secondary | ICD-10-CM

## 2024-04-17 DIAGNOSIS — Z96651 Presence of right artificial knee joint: Secondary | ICD-10-CM

## 2024-04-17 DIAGNOSIS — F409 Phobic anxiety disorder, unspecified: Secondary | ICD-10-CM

## 2024-04-17 DIAGNOSIS — Z8719 Personal history of other diseases of the digestive system: Secondary | ICD-10-CM

## 2024-04-17 DIAGNOSIS — K219 Gastro-esophageal reflux disease without esophagitis: Secondary | ICD-10-CM | POA: Diagnosis not present

## 2024-04-17 DIAGNOSIS — R002 Palpitations: Secondary | ICD-10-CM

## 2024-04-17 DIAGNOSIS — Z96652 Presence of left artificial knee joint: Secondary | ICD-10-CM

## 2024-04-17 DIAGNOSIS — E785 Hyperlipidemia, unspecified: Secondary | ICD-10-CM

## 2024-04-17 DIAGNOSIS — F419 Anxiety disorder, unspecified: Secondary | ICD-10-CM

## 2024-04-17 DIAGNOSIS — J683 Other acute and subacute respiratory conditions due to chemicals, gases, fumes and vapors: Secondary | ICD-10-CM

## 2024-04-17 DIAGNOSIS — K5904 Chronic idiopathic constipation: Secondary | ICD-10-CM

## 2024-04-17 DIAGNOSIS — I10 Essential (primary) hypertension: Secondary | ICD-10-CM

## 2024-04-17 DIAGNOSIS — Z6841 Body Mass Index (BMI) 40.0 and over, adult: Secondary | ICD-10-CM

## 2024-04-17 DIAGNOSIS — E119 Type 2 diabetes mellitus without complications: Secondary | ICD-10-CM

## 2024-04-17 DIAGNOSIS — G4700 Insomnia, unspecified: Secondary | ICD-10-CM | POA: Diagnosis not present

## 2024-04-17 DIAGNOSIS — R42 Dizziness and giddiness: Secondary | ICD-10-CM | POA: Diagnosis not present

## 2024-04-17 DIAGNOSIS — E559 Vitamin D deficiency, unspecified: Secondary | ICD-10-CM

## 2024-04-17 DIAGNOSIS — Z87898 Personal history of other specified conditions: Secondary | ICD-10-CM

## 2024-04-17 DIAGNOSIS — E039 Hypothyroidism, unspecified: Secondary | ICD-10-CM

## 2024-04-17 DIAGNOSIS — J45909 Unspecified asthma, uncomplicated: Secondary | ICD-10-CM

## 2024-04-17 LAB — HM DIABETES EYE EXAM

## 2024-04-17 NOTE — Progress Notes (Signed)
 Annual Wellness Visit   Patient Care Team: [name], MD as PCP - General (Internal Medicine)  Visit Date: 04/17/24   Chief Complaint  Patient presents with   Medicare Wellness   Annual Exam   Subjective:  Patient: Mckenzie Hebert, Female DOB: 09-02-1956, 68 y.o. MRN: [id number]  Mckenzie Hebert is a 68 y.o. Female who presents today for her Annual Wellness Visit. Patient has Urticaria; Asthma; Anxiety; Vitamin-D Deficiency; Hyperplastic Colon Polyp; GE Reflux; Insomnia; Hx of Migraine Headaches; Type 2 Diabetes Mellitus w/ Hyperglycemia, w/o Long-Term Current Use Of Insulin ; Obesity; Palpitation; Hypothyroidism; BMI 40.0-44.9; Osteoarthritis, Left Knee; Trochanteric Bursitis, Right Hip; Primary Osteoarthritis, Left Knee; Depression; Hypertension associated w/ Type 2 Diabetes Mellitus; and History of Colonic Polyps.  Has recently been treated for Vertigo with Zofran .   History of Palpitations treated with Metoprolol  succinate 37.5 mg daily. Had near syncope episode in 11/2020 and a cardiac evaluation spring of 2022. Myoview study normal. 14 day monitor showed frequent PVCs, frequent PACs and 31 episodes of SVT with the longest lasting 14 seconds.  Echocardiogram on 11/29/20 showed normal biventricular function, no significant valvular disease. Hypertension treated with Losartan  25 mg daily. Blood Pressure: normotensive today at 120/80.   History of Hyperlipidemia treated with Rosuvastatin  10 mg daily and Zetia  10 mg daily. 04/14/3034 Lipid Panel: WNL. 2022 Coronary Calcium  Score: 353.   History of Diabetes Mellitus, type II treated with Metformin  500 mg twice daily.  04/14/2024 HgbA1c 6.8, elevated from 6.1 in June 2025; Glucose 140, elevated from 99; and Albumin WNL at <0.2. Eye exam reportedly completed 10/2023 at [facility], last exam 11/11/2022 did not detect diabetic retinopathy. Since 04/2023, when she weighed 272 lbs, she has gained 2 pounds and today weighs 274  lbsBMI 42.91. Weight loss followed by NP Rockie Dalton.    History of Hypothyroidism treated with Levothyroxine  150 mcg daily. 04/14/2024 TSH: 1.62.  History of Insomnia treated with Xanax  1 mg as needed.  History of Vitamin-D Deficiency treated with 50,000 units weekly. 02/17/2024 Vitamin-D: 39.5.  History of GERD treated with Pantoprazole  40 mg daily.   History of Asthma managed with Albuterol  inhaler as needed.  History of Functional Constipation managed with Colace 100 mg twice daily   Labs 04/14/2024 CBC w/ Differential: WNL  Comprehensive Metabolic Panel: Glucose 140; otherwise WNL Lipid Panel: WNL  PAP Smear 02/22/2023 normal with repeat recommendation of 2027.   Mammogram 06/04/2023 normal with repeat recommendation of 2025.  Colonoscopy 08/08/2021  found/removed 2 small sessile polyps from distal sigmoid colon and 3 small sessile polyps from proximal ascending colon; Multiple Small & Large Mouthed Diverticula in sigmoid colon.  Bone Density 11/05/2023  T-score at Femur Left Neck was -1.3, osteopenic.    Vaccine Counseling: Due for Influenza and Shingrix 1st dose; UTD on PNA and Tdap.  Review of Systems  Constitutional:  Negative for chills, fever, malaise/fatigue and weight loss.  HENT:  Negative for hearing loss, sinus pain and sore throat.   Respiratory:  Negative for cough, hemoptysis and shortness of breath.   Cardiovascular:  Negative for chest pain, palpitations, leg swelling and PND.  Gastrointestinal:  Negative for abdominal pain, constipation, diarrhea, heartburn, nausea and vomiting.  Genitourinary:  Negative for dysuria, frequency and urgency.  Musculoskeletal:  Negative for back pain, myalgias and neck pain.  Skin:  Negative for itching and rash.  Neurological:  Negative for dizziness, tingling, seizures and headaches.  Endo/Heme/Allergies:  Negative for polydipsia.  Psychiatric/Behavioral:  Negative for depression. The  patient is not nervous/anxious.     Objective:  Vitals: body mass index is 42.91 kg/m. Today's Vitals   04/17/24 1053  BP: 120/80  Pulse: 64  SpO2: 95%  Weight: 274 lb (124.3 kg)  Height: 5' 7 (1.702 m)   Physical Exam Vitals and nursing note reviewed.  Constitutional:      General: She is not in acute distress.    Appearance: Normal appearance. She is not ill-appearing or toxic-appearing.  HENT:     Head: Normocephalic and atraumatic.     Right Ear: Hearing, tympanic membrane, ear canal and external ear normal.     Left Ear: Hearing, tympanic membrane, ear canal and external ear normal.     Mouth/Throat:     Pharynx: Oropharynx is clear.  Eyes:     Extraocular Movements: Extraocular movements intact.     Pupils: Pupils are equal, round, and reactive to light.  Neck:     Thyroid : No thyroid  mass, thyromegaly or thyroid  tenderness.     Vascular: No carotid bruit.  Cardiovascular:     Rate and Rhythm: Normal rate and regular rhythm. No extrasystoles are present.    Pulses:          Dorsalis pedis pulses are 2+ on the right side and 2+ on the left side.     Heart sounds: Normal heart sounds. No murmur heard.    No friction rub. No gallop.  Pulmonary:     Effort: Pulmonary effort is normal.     Breath sounds: Normal breath sounds. No decreased breath sounds, wheezing, rhonchi or rales.  Chest:     Chest wall: No mass.  Abdominal:     Palpations: Abdomen is soft. There is no hepatomegaly, splenomegaly or mass.     Tenderness: There is no abdominal tenderness.     Hernia: No hernia is present.  Musculoskeletal:     Cervical back: Normal range of motion.     Right lower leg: No edema.     Left lower leg: No edema.  Lymphadenopathy:     Cervical: No cervical adenopathy.     Upper Body:     Right upper body: No supraclavicular adenopathy.     Left upper body: No supraclavicular adenopathy.  Skin:    General: Skin is warm and dry.  Neurological:     General: No focal deficit present.     Mental  Status: She is alert and oriented to person, place, and time. Mental status is at baseline.     Sensory: Sensation is intact.     Motor: Motor function is intact. No weakness.     Deep Tendon Reflexes: Reflexes are normal and symmetric.  Psychiatric:        Attention and Perception: Attention normal.        Mood and Affect: Mood normal.        Speech: Speech normal.        Behavior: Behavior normal.        Thought Content: Thought content normal.        Cognition and Memory: Cognition normal.        Judgment: Judgment normal.    Current Outpatient Medications  Medication Instructions   albuterol  (VENTOLIN  HFA) 108 (90 Base) MCG/ACT inhaler 2 puffs, Inhalation, Every 6 hours PRN   ALPRAZolam  (XANAX ) 1 MG tablet TAKE 1 TABLET BY MOUTH AS NEEDED FOR SLEEP   blood glucose meter kit and supplies KIT 1 each, Does not apply, Daily PRN, Check glucose daily  before breakfast and supper.   Blood Glucose Monitoring Suppl (TRUE METRIX AIR GLUCOSE METER) DEVI 1 Device, Does not apply, Daily, E11.65   docusate sodium  (COLACE) 100 mg, 2 times daily   ezetimibe  (ZETIA ) 10 mg, Daily   glucose blood test strip USE TO TEST BLOOD GLUCOSE TWICE DAILY BEFORE BREAKFAST AND SUPPER.   Lancets 28G MISC 1 each, Does not apply, Daily, DX E11.65   levothyroxine  (SYNTHROID ) 150 mcg, Oral, Daily   losartan  (COZAAR ) 25 MG tablet 1 tablet, Daily   losartan  (COZAAR ) 25 mg, Oral, Daily   metFORMIN  (GLUCOPHAGE ) 500 mg, Oral, 2 times daily with meals   metoprolol  succinate (TOPROL -XL) 25 MG 24 hr tablet TAKE 1 AND 1/2 TABLETS DAILY BY MOUTH   ondansetron  (ZOFRAN ) 4 mg, Oral, Every 8 hours PRN   pantoprazole  (PROTONIX ) 40 mg, Oral, Daily   PANTOPRAZOLE  SODIUM PO Pantoprazole  Sodium   rosuvastatin  (CRESTOR ) 10 MG tablet TAKE 1 TABLET BY MOUTH EVERY DAY   UNABLE TO FIND    UNABLE TO FIND USE TO CECK BLOOD GLUCOSE TWICE DAILY BEFORE BREAKFAST AND SUPPER.   UNABLE TO FIND 160 ML ORALLY TWICE for 2   UNABLE TO FIND  Levothyroxine  Sodium   Vitamin D  (Ergocalciferol ) (DRISDOL ) 50,000 Units, Oral, Every 7 days   Past Medical History:  Diagnosis Date   AC (acromioclavicular) joint bone spurs 2008   right foot   Anxiety    Asthma    Back pain    Bilateral swelling of feet    Constipation    Dyspnea    Dysrhythmia 2019   PVC   Endometrial polyp    Heartburn    History of colon polyps    Hypothyroidism    Joint pain    Migraines    Dr Malcom   Osteoarthritis    PAC (premature atrial contraction)    Palpitations    cardiologist-  dr hochrein-- normal myoview 2017, holter 2015 mild ectopy   PONV (postoperative nausea and vomiting)    Pre-diabetes 2019   PVC (premature ventricular contraction)    SOB (shortness of breath)    Thyroid  disease    Uterine fibroid    Vitamin D  deficiency    Medical/Surgical History Narrative:  Allergic/Intolerant to:  Allergies  Allergen Reactions   Percocet [Oxycodone -Acetaminophen ] Nausea Only   2019 - Hysteroscopic Resection of Endometrial Polyp and Leiomyoma in October by Dr. Rockney  2011 - Herpes Zoster  2010 - Angioedema of the lip  2009 - H. Pylori  1995 - Appendectomy  Past Surgical History:  Procedure Laterality Date   APPENDECTOMY     CARDIOVASCULAR STRESS TEST  10-11-2015   dr hochrein   normal nuclear study w/ no ischemia/  normal LV function and wall motion, nuclear stress ef 64%   D & C HYSTERSCOPY W/ RESECTION POLYP  01-16-2010   dr gottsegen  @ [facility]   DILATATION & CURETTAGE/HYSTEROSCOPY WITH MYOSURE N/A 06/07/2018   Procedure: DILATATION & CURETTAGE/HYSTEROSCOPY WITH MYOSURE;  Surgeon: Rockney Evalene SQUIBB, MD;  Location: [facility];  Service: Gynecology;  Laterality: N/A;  request to follow first case in [place] [facility] block time  requests one hour   FOOT SURGERY  2002, 2008   bi-lat , bone spurs, achilles tendon work   HAMMER TOE SURGERY  11/2018   RESECTION TUMOR CLAVICLE RADICAL  1985   benign   TONSILLECTOMY      TOTAL KNEE ARTHROPLASTY Left 12/09/2020   Procedure: TOTAL KNEE ARTHROPLASTY;  Surgeon: Melodi,  Dempsey, MD;  Location: [facility];  Service: Orthopedics;  Laterality: Left;    Family History  Problem Relation Age of Onset   Diabetes Mother    Hypertension Mother    Heart disease Mother 50       CABG   Stroke Mother    Hyperlipidemia Mother    Cancer Father        lung cancer   Depression Father    Alcohol abuse Father    Diabetes Sister    Hypertension Sister    Thyroid  disease Sister        hyperthyroidism   Breast cancer Paternal Aunt    Hypertension Sister    Autoimmune disease Sister    Leukemia Paternal Grandmother    Mental retardation Other    Family History Narrative: Paternal Grandmother w/ hx of Leukemia Paternal Aunt w/ hx of Breast Cancer Father, deceased in his 91s, w/ hx of Lung Cancer, Depression, and Alcohol Abuse Mother, deceased age 76, w/ hx of Diabetes, Hypertension, MI s/p CABG at age 40, Stroke, and Hyperlipidemia Siblings, 2 Brother(s) & 2 Sister(s) - hx of Diabetes, Hypertension x2, Hyperthyroidism, and Autoimmune Disease Unknown Relation w/ hx of Mental Retardation Social History   Social History Narrative   Lives alone.  Retired, previously worked as a Runner, broadcasting/film/video for [employer]. Widowed, husband deceased due to heart problems. No children. Former smoker until age 71. No alcohol consumption.    Most Recent Health Risks Assessment:   Medicare Risk at Home - 04/17/24 1052     Any stairs in or around the home? Yes    If so, are there any without handrails? No    Home free of loose throw rugs in walkways, pet beds, electrical cords, etc? Yes    Adequate lighting in your home to reduce risk of falls? Yes    Life alert? No    Use of a cane, walker or w/c? No    Grab bars in the bathroom? Yes    Shower chair or bench in shower? Yes    Elevated toilet seat or a handicapped toilet? Yes         Most Recent Social  Determinants of Health (Including Hx of Tobacco, Alcohol, and Drug Use) SDOH Screenings   Food Insecurity: No Food Insecurity (04/17/2024)  Housing: Low Risk  (04/17/2024)  Transportation Needs: No Transportation Needs (04/17/2024)  Utilities: Not At Risk (04/17/2024)  Alcohol Screen: Low Risk  (04/17/2024)  Depression (PHQ2-9): Low Risk  (04/17/2024)  Financial Resource Strain: Low Risk  (04/17/2024)  Physical Activity: Insufficiently Active (04/17/2024)  Social Connections: Socially Isolated (04/17/2024)  Stress: No Stress Concern Present (04/17/2024)  Tobacco Use: Medium Risk (04/17/2024)  Health Literacy: Adequate Health Literacy (04/17/2024)   Social History   Tobacco Use   Smoking status: Former    Current packs/day: 0.00    Types: Cigarettes    Quit date: 1973    Years since quitting: 52.6   Smokeless tobacco: Never  Vaping Use   Vaping status: Never Used  Substance Use Topics   Alcohol use: No    Alcohol/week: 0.0 standard drinks of alcohol   Drug use: No   Most Recent Functional Status Assessment:    04/17/2024   10:52 AM  In your present state of health, do you have any difficulty performing the following activities:  Hearing? 0  Vision? 0  Difficulty concentrating or making decisions? 0  Walking or climbing stairs? 0  Dressing or bathing? 0  Doing errands, shopping? 0  Preparing Food and eating ? N  Using the Toilet? N  In the past six months, have you accidently leaked urine? N  Do you have problems with loss of bowel control? N  Managing your Medications? N  Managing your Finances? N  Housekeeping or managing your Housekeeping? N   Most Recent Fall Risk Assessment:    04/17/2024   11:01 AM  Fall Risk   Falls in the past year? 1  Number falls in past yr: 1  Injury with Fall? 0  Risk for fall due to : No Fall Risks;Other (Comment)  Follow up Falls prevention discussed;Education provided;Falls evaluation completed   Most Recent Anxiety/Depression  Screenings:    04/17/2024   11:09 AM 03/16/2024    2:42 PM  PHQ 2/9 Scores  PHQ - 2 Score 0 0  PHQ- 9 Score 0 0   Most Recent Cognitive Screening:    04/17/2024   11:09 AM  6CIT Screen  What Year? 0 points  What month? 0 points  What time? 0 points  Count back from 20 0 points  Months in reverse 0 points  Repeat phrase 0 points  Total Score 0 points   Most Recent Vision/Hearing Screenings:No results found. Results:  Studies Obtained And Personally Reviewed By Me:  Diabetic Foot Exam - Simple   Simple Foot Form Diabetic Foot exam was performed with the following findings: Yes 04/17/2024 11:10 AM  Visual Inspection No deformities, no ulcerations, no other skin breakdown bilaterally: Yes Sensation Testing Intact to touch and monofilament testing bilaterally: Yes Pulse Check Posterior Tibialis and Dorsalis pulse intact bilaterally: Yes Comments    02/27/2021 Coronary Calcium  Score  IMPRESSION: 1. Coronary calcium  score of 353. This was 94th percentile for age-, race-, and sex-matched controls.   2.  Dilated ascending aorta measuring 41mm  PAP Smear 02/22/2023 normal.   Mammogram 06/04/2023 normal.  Colonoscopy 08/08/2021  found/removed 2 small sessile polyps from distal sigmoid colon and 3 small sessile polyps from proximal ascending colon; Multiple Small & Large Mouthed Diverticula in sigmoid colon.  Bone Density 11/05/2023  T-score at Femur Left Neck was -1.3, osteopenic.    Labs:  CBC w/ Differential Lab Results  Component Value Date   WBC 5.2 04/14/2024   RBC 4.51 04/14/2024   HGB 12.3 04/14/2024   HCT 38.0 04/14/2024   PLT 237 04/14/2024   MCV 84.3 04/14/2024   MCH 27.3 04/14/2024   MCHC 32.4 04/14/2024   RDW 13.6 04/14/2024   MPV 9.6 04/14/2024   LYMPHSABS 1,891 01/29/2022   MONOABS 504 11/06/2016   BASOSABS 31 04/14/2024    Comprehensive Metabolic Panel Lab Results  Component Value Date   NA 139 04/14/2024   K 4.5 04/14/2024   CL 104 04/14/2024    CO2 29 04/14/2024   GLUCOSE 140 (H) 04/14/2024   BUN 12 04/14/2024   CREATININE 0.61 04/14/2024   CALCIUM  9.0 04/14/2024   PROT 6.6 04/14/2024   ALBUMIN 4.4 02/17/2024   AST 16 04/14/2024   ALT 20 04/14/2024   ALKPHOS 67 02/17/2024   BILITOT 0.5 04/14/2024   EGFR 94 02/17/2024   GFRNONAA 94 01/28/2021   Lipid Panel  Lab Results  Component Value Date   CHOL 111 04/14/2024   HDL 58 04/14/2024   LDLCALC 37 04/14/2024   TRIG 81 04/14/2024   A1c Lab Results  Component Value Date   HGBA1C 6.8 (H) 04/14/2024    TSH Lab Results  Component Value Date  TSH 1.62 04/14/2024   Assessment & Plan:   Orders Placed This Encounter  Procedures   POCT URINALYSIS DIP (CLINITEK)  Other Labs Reviewed today: CBC w/ Differential: WNL  Comprehensive Metabolic Panel: Glucose 140; otherwise WNL Lipid Panel: WNL  Vertigo treated with Zofran .   Palpitations treated with Metoprolol  succinate 37.5 mg daily.   Hypertension treated with Losartan  25 mg daily. Blood Pressure: normotensive today at 120/80.   Hyperlipidemia treated with Rosuvastatin  10 mg daily and Zetia  10 mg daily. 04/14/3034 Lipid Panel: WNL. 2022 Coronary Calcium  Score: 353.   Diabetes Mellitus, type II treated with Metformin  500 mg twice daily.  8/08/20258/04/2024 HgbA1c 6.8, elevated from 6.1 in June 2025; Glucose 140, elevated from 99; and Albumin WNL at <0.2. Eye exam reportedly completed 10/2023 at [facility], last exam 11/11/2022 did not detect diabetic retinopathy. Since 04/2023, when she weighed 272 lbs, she has gained 2 pounds and today weighs 274 lbsBMI 42.91. Weight loss followed by NP Rockie Dalton.    Hypothyroidism treated with Levothyroxine  150 mcg daily. 04/14/2024 TSH: 1.62.  Insomnia treated with Xanax  1 mg as needed.  Vitamin-D Deficiency treated with 50,000 units weekly. 02/17/2024 Vitamin-D: 39.5.  GERD treated with Pantoprazole  40 mg daily.   Asthma managed with Albuterol  inhaler as  needed.  Functional Constipation managed with Colace 100 mg twice daily   PAP Smear 02/22/2023 normal with repeat recommendation of 2027.   Mammogram 06/04/2023 normal with repeat recommendation of 2025.  Colonoscopy 08/08/2021  found/removed 2 small sessile polyps from distal sigmoid colon and 3 small sessile polyps from proximal ascending colon; Multiple Small & Large Mouthed Diverticula in sigmoid colon.  Bone Density 11/05/2023  T-score at Femur Left Neck was -1.3, osteopenic.    Vaccine Counseling: Due for Influenza and Shingrix 1st dose; UTD on PNA and Tdap.   Annual Wellness Visit done today including the all of the following: Reviewed patient's Family Medical History Reviewed patient's SDOH and reviewed tobacco, alcohol, and drug use.  Reviewed and updated list of patient's medical providers Assessment of cognitive impairment was done Assessed patient's functional ability Established a written schedule for health screening services Health Risk Assessent Completed and Reviewed  Discussed health benefits of physical activity, and encouraged her to engage in regular exercise appropriate for her age and condition.   I,Emily Lagle,acting as a Neurosurgeon for Ronal JINNY Hailstone, MD.,have documented all relevant documentation on the behalf of Ronal JINNY Hailstone, MD,as directed by  Ronal JINNY Hailstone, MD while in the presence of Ronal JINNY Hailstone, MD.   I, Ronal JINNY Hailstone, MD, have reviewed all documentation for this visit. The documentation on 04/17/2024 for the exam, diagnosis, procedures, and orders are all accurate and complete.

## 2024-04-17 NOTE — Progress Notes (Unsigned)
 Subjective:   Mckenzie Hebert is a 68 y.o. female who presents for Medicare Annual (Subsequent) preventive examination.  Visit Complete: In person  Patient Medicare AWV questionnaire was completed by the patient on 04/17/2024; I have confirmed that all information answered by patient is correct and no changes since this date.  Cardiac Risk Factors include: advanced age (>48men, >32 women)     Objective:    Today's Vitals   04/17/24 1053  BP: 120/80  Pulse: 64  SpO2: 95%  Weight: 274 lb (124.3 kg)  Height: 5' 7 (1.702 m)   Body mass index is 42.91 kg/m.     04/17/2024   11:06 AM 04/13/2023    3:10 PM 02/03/2022   10:11 AM 12/09/2020   11:34 AM 12/02/2020    2:08 PM 06/07/2018    7:34 AM  Advanced Directives  Does Patient Have a Medical Advance Directive? Yes Yes Yes Yes Yes Yes   Type of Advance Directive Living will;Healthcare Power of Attorney Living will;Healthcare Power of [employer] Power of [place];Living will Healthcare Power of [place];Living will Healthcare Power of [place];Living will Healthcare Power of [place];Living will  Does patient want to make changes to medical advance directive?   No - Patient declined No - Patient declined    Copy of Healthcare Power of Attorney in Chart? No - copy requested  No - copy requested No - copy requested  No - copy requested      Data saved with a previous flowsheet row definition    Current Medications (verified) Outpatient Encounter Medications as of 04/17/2024  Medication Sig   albuterol  (VENTOLIN  HFA) 108 (90 Base) MCG/ACT inhaler INHALE 2 PUFFS BY MOUTH EVERY 6 HOURS AS NEEDED   ALPRAZolam  (XANAX ) 1 MG tablet TAKE 1 TABLET BY MOUTH AS NEEDED FOR SLEEP   blood glucose meter kit and supplies KIT 1 each by Does not apply route daily as needed. Check glucose daily before breakfast and supper.   Blood Glucose Monitoring Suppl (TRUE METRIX AIR GLUCOSE METER) DEVI 1 Device by Does not apply route daily. E11.65    docusate sodium  (COLACE) 100 MG capsule Take 100 mg by mouth 2 (two) times daily.   ezetimibe  (ZETIA ) 10 MG tablet Take 10 mg by mouth daily.   glucose blood test strip USE TO TEST BLOOD GLUCOSE TWICE DAILY BEFORE BREAKFAST AND SUPPER.   Lancets 28G MISC 1 each by Does not apply route daily. DX E11.65   levothyroxine  (SYNTHROID ) 150 MCG tablet TAKE 1 TABLET BY MOUTH EVERY DAY   losartan  (COZAAR ) 25 MG tablet TAKE 1 TABLET (25 MG TOTAL) BY MOUTH DAILY.   losartan  (COZAAR ) 25 MG tablet Take 1 tablet by mouth daily.   metFORMIN  (GLUCOPHAGE ) 500 MG tablet Take 1 tablet (500 mg total) by mouth 2 (two) times daily with a meal.   metoprolol  succinate (TOPROL -XL) 25 MG 24 hr tablet TAKE 1 AND 1/2 TABLETS DAILY BY MOUTH   ondansetron  (ZOFRAN ) 4 MG tablet Take 1 tablet (4 mg total) by mouth every 8 (eight) hours as needed for nausea or vomiting.   pantoprazole  (PROTONIX ) 40 MG tablet TAKE 1 TABLET BY MOUTH EVERY DAY   PANTOPRAZOLE  SODIUM PO Pantoprazole  Sodium   rosuvastatin  (CRESTOR ) 10 MG tablet TAKE 1 TABLET BY MOUTH EVERY DAY   UNABLE TO FIND    UNABLE TO FIND USE TO CECK BLOOD GLUCOSE TWICE DAILY BEFORE BREAKFAST AND SUPPER.   UNABLE TO FIND 160 ML ORALLY TWICE for 2   UNABLE  TO FIND Levothyroxine  Sodium   Vitamin D , Ergocalciferol , (DRISDOL ) 1.25 MG (50000 UNIT) CAPS capsule Take 1 capsule (50,000 Units total) by mouth every 7 (seven) days.   [DISCONTINUED] clobetasol  ointment (TEMOVATE ) 0.05 % Apply 1 application topically 2 (two) times daily. (Patient not taking: Reported on 04/06/2024)   [DISCONTINUED] clotrimazole -betamethasone  (LOTRISONE ) cream Apply 1 application topically 2 (two) times daily. (Patient not taking: Reported on 04/06/2024)   [DISCONTINUED] fluocinonide -emollient (LIDEX -E) 0.05 % cream Apply 1 application topically 2 (two) times daily. (Patient not taking: Reported on 04/06/2024)   [DISCONTINUED] terconazole  (TERAZOL 7 ) 0.4 % vaginal cream Place 1 applicator vaginally at bedtime.  (Patient not taking: Reported on 04/06/2024)   No facility-administered encounter medications on file as of 04/17/2024.    Allergies (verified) Percocet [oxycodone -acetaminophen ]   History: Past Medical History:  Diagnosis Date   AC (acromioclavicular) joint bone spurs 2008   right foot   Anxiety    Asthma    Back pain    Bilateral swelling of feet    Constipation    Dyspnea    Dysrhythmia 2019   PVC   Endometrial polyp    Heartburn    History of colon polyps    Hypothyroidism    Joint pain    Migraines    Dr Malcom   Osteoarthritis    PAC (premature atrial contraction)    Palpitations    cardiologist-  dr hochrein-- normal myoview 2017, holter 2015 mild ectopy   PONV (postoperative nausea and vomiting)    Pre-diabetes 2019   PVC (premature ventricular contraction)    SOB (shortness of breath)    Thyroid  disease    Uterine fibroid    Vitamin D  deficiency    Past Surgical History:  Procedure Laterality Date   APPENDECTOMY     CARDIOVASCULAR STRESS TEST  10-11-2015   dr hochrein   normal nuclear study w/ no ischemia/  normal LV function and wall motion, nuclear stress ef 64%   D & C HYSTERSCOPY W/ RESECTION POLYP  01-16-2010   dr gottsegen  @ [facility]   DILATATION & CURETTAGE/HYSTEROSCOPY WITH MYOSURE N/A 06/07/2018   Procedure: DILATATION & CURETTAGE/HYSTEROSCOPY WITH MYOSURE;  Surgeon: Rockney Evalene SQUIBB, MD;  Location: [facility];  Service: Gynecology;  Laterality: N/A;  request to follow first case in [place] [facility] block time  requests one hour   FOOT SURGERY  2002, 2008   bi-lat , bone spurs, achilles tendon work   HAMMER TOE SURGERY  11/2018   RESECTION TUMOR CLAVICLE RADICAL  1985   benign   TONSILLECTOMY     TOTAL KNEE ARTHROPLASTY Left 12/09/2020   Procedure: TOTAL KNEE ARTHROPLASTY;  Surgeon: Melodi Lerner, MD;  Location: [facility];  Service: Orthopedics;  Laterality: Left;    Family History  Problem Relation Age of Onset   Diabetes  Mother    Hypertension Mother    Heart disease Mother 86       CABG   Stroke Mother    Hyperlipidemia Mother    Cancer Father        lung cancer   Depression Father    Alcohol abuse Father    Diabetes Sister    Hypertension Sister    Thyroid  disease Sister        hyperthyroidism   Breast cancer Paternal Aunt    Hypertension Sister    Autoimmune disease Sister    Leukemia Paternal Grandmother    Mental retardation Other    Social History  Socioeconomic History   Marital status: Widowed    Spouse name: Not on file   Number of children: Not on file   Years of education: Not on file   Highest education level: Not on file  Occupational History   Occupation: Histology    Employer: [employer]  Tobacco Use   Smoking status: Former    Current packs/day: 0.00    Types: Cigarettes    Quit date: 1973    Years since quitting: 52.6   Smokeless tobacco: Never  Vaping Use   Vaping status: Never Used  Substance and Sexual Activity   Alcohol use: No    Alcohol/week: 0.0 standard drinks of alcohol   Drug use: No   Sexual activity: Not Currently    Comment: INTERCOURSE AGE 53, SEXUAL PARTNERS LESS THAN 5  Other Topics Concern   Not on file  Social History Narrative   Lives alone.  Retired, previously worked as a Runner, broadcasting/film/video for [employer]. Widowed, husband deceased due to heart problems. No children. Former smoker until age 64. No alcohol consumption.   Social Drivers of Corporate investment banker Strain: Low Risk  (04/17/2024)   Overall Financial Resource Strain (CARDIA)    Difficulty of Paying Living Expenses: Not hard at all  Food Insecurity: No Food Insecurity (04/17/2024)   Hunger Vital Sign    Worried About Running Out of Food in the Last Year: Never true    Ran Out of Food in the Last Year: Never true  Transportation Needs: No Transportation Needs (04/17/2024)   PRAPARE - Administrator, Civil Service (Medical): No    Lack  of Transportation (Non-Medical): No  Physical Activity: Insufficiently Active (04/17/2024)   Exercise Vital Sign    Days of Exercise per Week: 4 days    Minutes of Exercise per Session: 30 min  Stress: No Stress Concern Present (04/17/2024)   [employer] of Occupational Health - Occupational Stress Questionnaire    Feeling of Stress: Only a little  Social Connections: Socially Isolated (04/17/2024)   Social Connection and Isolation Panel    Frequency of Communication with Friends and Family: More than three times a week    Frequency of Social Gatherings with Friends and Family: More than three times a week    Attends Religious Services: Never    Database administrator or Organizations: No    Attends Banker Meetings: Never    Marital Status: Widowed    Tobacco Counseling Counseling given: No   Clinical Intake:                        Activities of Daily Living    04/17/2024   10:52 AM 04/15/2024    1:55 AM  In your present state of health, do you have any difficulty performing the following activities:  Hearing? 0 0  Vision? 0 0  Difficulty concentrating or making decisions? 0 0  Walking or climbing stairs? 0 0  Dressing or bathing? 0 0  Doing errands, shopping? 0 0  Preparing Food and eating ? N N  Using the Toilet? N N  In the past six months, have you accidently leaked urine? N N  Do you have problems with loss of bowel control? N N  Managing your Medications? N N  Managing your Finances? N N  Housekeeping or managing your Housekeeping? N N    Patient Care Team: [name], MD as PCP -  General (Internal Medicine)  Indicate any recent Medical Services you may have received from other than [facility] providers in the past year (date may be approximate).     Assessment:   This is a routine wellness examination for Anaelle.  Hearing/Vision screen No results found.   Goals Addressed   None    Depression Screen    04/17/2024   11:09  AM 03/16/2024    2:42 PM 02/10/2024    9:58 AM 04/13/2023    3:12 PM 02/22/2023   10:24 AM 11/10/2022   10:36 AM 05/12/2022   10:22 AM  PHQ 2/9 Scores  PHQ - 2 Score 0 0  0 0 0 0  PHQ- 9 Score 0 0       Exception Documentation   Patient refusal        Fall Risk    04/17/2024   11:01 AM 04/15/2024    1:55 AM 04/13/2023    3:12 PM 11/10/2022   10:36 AM 05/12/2022   10:22 AM  Fall Risk   Falls in the past year? 1 1 0 0 0  Number falls in past yr: 1 1 0 0 0  Injury with Fall? 0 0 0 0 0  Risk for fall due to : No Fall Risks;Other (Comment)  No Fall Risks No Fall Risks No Fall Risks  Follow up Falls prevention discussed;Education provided;Falls evaluation completed  Falls evaluation completed;Falls prevention discussed Falls evaluation completed Falls evaluation completed      Data saved with a previous flowsheet row definition    MEDICARE RISK AT HOME: Medicare Risk at Home Any stairs in or around the home?: Yes If so, are there any without handrails?: No Home free of loose throw rugs in walkways, pet beds, electrical cords, etc?: Yes Adequate lighting in your home to reduce risk of falls?: Yes Life alert?: No Use of a cane, walker or w/c?: No Grab bars in the bathroom?: Yes Shower chair or bench in shower?: Yes Elevated toilet seat or a handicapped toilet?: Yes  TIMED UP AND GO:  Was the test performed?  No    Cognitive Function:        04/17/2024   11:09 AM 02/03/2022   10:12 AM  6CIT Screen  What Year? 0 points 0 points  What month? 0 points 0 points  What time? 0 points 0 points  Count back from 20 0 points 0 points  Months in reverse 0 points 0 points  Repeat phrase 0 points 0 points  Total Score 0 points 0 points    Immunizations Immunization History  Administered Date(s) Administered   Fluad Quad(high Dose 65+) 07/12/2020   Influenza Split 06/02/2011   Influenza,inj,Quad PF,6+ Mos 07/27/2014, 05/14/2015, 05/15/2016, 05/13/2017, 05/16/2019, 05/12/2022    Influenza,inj,quad, With Preservative 05/08/2017, 07/08/2018   Influenza-Unspecified 06/02/2021, 06/25/2023   Moderna Sars-Covid-2 Vaccination 06/14/2020, 07/12/2020   PNEUMOCOCCAL CONJUGATE-20 11/10/2022   Tdap 11/30/2011, 12/28/2022    TDAP status: Up to date  Flu Vaccine status: Due, Education has been provided regarding the importance of this vaccine. Advised may receive this vaccine at local pharmacy or Health Dept. Aware to provide a copy of the vaccination record if obtained from local pharmacy or Health Dept. Verbalized acceptance and understanding.  Pneumococcal vaccine status: Up to date  Covid-19 vaccine status: Information provided on how to obtain vaccines.   Qualifies for Shingles Vaccine? Yes   Zostavax completed No   Shingrix Completed?: Yes  Screening Tests Health Maintenance  Topic Date Due  Zoster Vaccines- Shingrix (1 of 2) Never done   OPHTHALMOLOGY EXAM  11/11/2023   INFLUENZA VACCINE  04/07/2024   HEMOGLOBIN A1C  10/15/2024   Diabetic kidney evaluation - eGFR measurement  04/14/2025   Diabetic kidney evaluation - Urine ACR  04/14/2025   FOOT EXAM  04/17/2025   Medicare Annual Wellness (AWV)  04/17/2025   MAMMOGRAM  06/03/2025   DTaP/Tdap/Td (3 - Td or Tdap) 12/27/2032   Pneumococcal Vaccine: 50+ Years  Completed   DEXA SCAN  Completed   Hepatitis B Vaccines  Aged Out   HPV VACCINES  Aged Out   Meningococcal B Vaccine  Aged Out   Colonoscopy  Discontinued   COVID-19 Vaccine  Discontinued   Hepatitis C Screening  Discontinued   Fecal DNA (Cologuard)  Discontinued    Health Maintenance  Health Maintenance Due  Topic Date Due   Zoster Vaccines- Shingrix (1 of 2) Never done   OPHTHALMOLOGY EXAM  11/11/2023   INFLUENZA VACCINE  04/07/2024    Colorectal cancer screening: No longer required.   Mammogram status: Completed 06/04/2023. Repeat every year  Bone Density status: Completed 11/05/2023. Results reflect: Bone density results: OSTEOPENIA.  Repeat every 2 years.  Lung Cancer Screening: (Low Dose CT Chest recommended if Age 61-80 years, 20 pack-year currently smoking OR have quit w/in 15years.) does not qualify.   Additional Screening:  Hepatitis C Screening:  qualify; Completed  Vision Screening: Recommended annual ophthalmology exams for early detection of glaucoma and other disorders of the eye. Is the patient up to date with their annual eye exam?  Yes  Who is the provider or what is the name of the office in which the patient attends annual eye exams? [facility] If pt is not established with a provider, would they like to be referred to a provider to establish care? No .   Dental Screening: Recommended annual dental exams for proper oral hygiene  Diabetic Foot Exam: Diabetic Foot Exam: Completed 04/17/2024  Community Resource Referral / Chronic Care Management: CRR required this visit?  No   CCM required this visit?  No     Plan:     I have personally reviewed and noted the following in the patient's chart:   Medical and social history Use of alcohol, tobacco or illicit drugs  Current medications and supplements including opioid prescriptions. Patient is not currently taking opioid prescriptions. Functional ability and status Nutritional status Physical activity Advanced directives List of other physicians Hospitalizations, surgeries, and ER visits in previous 12 months Vitals Screenings to include cognitive, depression, and falls Referrals and appointments  In addition, I have reviewed and discussed with patient certain preventive protocols, quality metrics, and best practice recommendations. A written personalized care plan for preventive services as well as general preventive health recommendations were provided to patient.     Araceli Zelda, CMA   04/17/2024   After Visit Summary: (In Person-Printed) AVS printed and given to the patient  I have reviewed and agree with the above  Annual Wellness Visit documentation.  Ronal Norleen Hailstone, MD. Internal Medicine 04/17/2024

## 2024-04-17 NOTE — Patient Instructions (Signed)
 Next appointment: Follow up in one year for your annual wellness visit    Preventive Care 69 Years and Older, Female Preventive care refers to lifestyle choices and visits with your health care provider that can promote health and wellness. What does preventive care include? A yearly physical exam. This is also called an annual well check. Dental exams once or twice a year. Routine eye exams. Ask your health care provider how often you should have your eyes checked. Personal lifestyle choices, including: Daily care of your teeth and gums. Regular physical activity. Eating a healthy diet. Avoiding tobacco and drug use. Limiting alcohol use. Practicing safe sex. Taking low-dose aspirin every day. Taking vitamin and mineral supplements as recommended by your health care provider. What happens during an annual well check? The services and screenings done by your health care provider during your annual well check will depend on your age, overall health, lifestyle risk factors, and family history of disease. Counseling  Your health care provider may ask you questions about your: Alcohol use. Tobacco use. Drug use. Emotional well-being. Home and relationship well-being. Sexual activity. Eating habits. History of falls. Memory and ability to understand (cognition). Work and work Astronomer. Reproductive health. Screening  You may have the following tests or measurements: Height, weight, and BMI. Blood pressure. Lipid and cholesterol levels. These may be checked every 5 years, or more frequently if you are over 17 years old. Skin check. Lung cancer screening. You may have this screening every year starting at age 19 if you have a 30-pack-year history of smoking and currently smoke or have quit within the past 15 years. Fecal occult blood test (FOBT) of the stool. You may have this test every year starting at age 31. Flexible sigmoidoscopy or colonoscopy. You may have a  sigmoidoscopy every 5 years or a colonoscopy every 10 years starting at age 58. Hepatitis C blood test. Hepatitis B blood test. Sexually transmitted disease (STD) testing. Diabetes screening. This is done by checking your blood sugar (glucose) after you have not eaten for a while (fasting). You may have this done every 1-3 years. Bone density scan. This is done to screen for osteoporosis. You may have this done starting at age 87. Mammogram. This may be done every 1-2 years. Talk to your health care provider about how often you should have regular mammograms. Talk with your health care provider about your test results, treatment options, and if necessary, the need for more tests. Vaccines  Your health care provider may recommend certain vaccines, such as: Influenza vaccine. This is recommended every year. Tetanus, diphtheria, and acellular pertussis (Tdap, Td) vaccine. You may need a Td booster every 10 years. Zoster vaccine. You may need this after age 52. Pneumococcal 13-valent conjugate (PCV13) vaccine. One dose is recommended after age 18. Pneumococcal polysaccharide (PPSV23) vaccine. One dose is recommended after age 25. Talk to your health care provider about which screenings and vaccines you need and how often you need them. This information is not intended to replace advice given to you by your health care provider. Make sure you discuss any questions you have with your health care provider. Document Released: 09/20/2015 Document Revised: 05/13/2016 Document Reviewed: 06/25/2015 Elsevier Interactive Patient Education  2017 [employer].  Fall Prevention in the Home Falls can cause injuries. They can happen to people of all ages. There are many things you can do to make your home safe and to help prevent falls. What can I do on the outside of  my home? Regularly fix the edges of walkways and driveways and fix any cracks. Remove anything that might make you trip as you walk through a  door, such as a raised step or threshold. Trim any bushes or trees on the path to your home. Use bright outdoor lighting. Clear any walking paths of anything that might make someone trip, such as rocks or tools. Regularly check to see if handrails are loose or broken. Make sure that both sides of any steps have handrails. Any raised decks and porches should have guardrails on the edges. Have any leaves, snow, or ice cleared regularly. Use sand or salt on walking paths during winter. Clean up any spills in your garage right away. This includes oil or grease spills. What can I do in the bathroom? Use night lights. Install grab bars by the toilet and in the tub and shower. Do not use towel bars as grab bars. Use non-skid mats or decals in the tub or shower. If you need to sit down in the shower, use a plastic, non-slip stool. Keep the floor dry. Clean up any water that spills on the floor as soon as it happens. Remove soap buildup in the tub or shower regularly. Attach bath mats securely with double-sided non-slip rug tape. Do not have throw rugs and other things on the floor that can make you trip. What can I do in the bedroom? Use night lights. Make sure that you have a light by your bed that is easy to reach. Do not use any sheets or blankets that are too big for your bed. They should not hang down onto the floor. Have a firm chair that has side arms. You can use this for support while you get dressed. Do not have throw rugs and other things on the floor that can make you trip. What can I do in the kitchen? Clean up any spills right away. Avoid walking on wet floors. Keep items that you use a lot in easy-to-reach places. If you need to reach something above you, use a strong step stool that has a grab bar. Keep electrical cords out of the way. Do not use floor polish or wax that makes floors slippery. If you must use wax, use non-skid floor wax. Do not have throw rugs and other things  on the floor that can make you trip. What can I do with my stairs? Do not leave any items on the stairs. Make sure that there are handrails on both sides of the stairs and use them. Fix handrails that are broken or loose. Make sure that handrails are as long as the stairways. Check any carpeting to make sure that it is firmly attached to the stairs. Fix any carpet that is loose or worn. Avoid having throw rugs at the top or bottom of the stairs. If you do have throw rugs, attach them to the floor with carpet tape. Make sure that you have a light switch at the top of the stairs and the bottom of the stairs. If you do not have them, ask someone to add them for you. What else can I do to help prevent falls? Wear shoes that: Do not have high heels. Have rubber bottoms. Are comfortable and fit you well. Are closed at the toe. Do not wear sandals. If you use a stepladder: Make sure that it is fully opened. Do not climb a closed stepladder. Make sure that both sides of the stepladder are locked into place. Ask  someone to hold it for you, if possible. Clearly mark and make sure that you can see: Any grab bars or handrails. First and last steps. Where the edge of each step is. Use tools that help you move around (mobility aids) if they are needed. These include: Canes. Walkers. Scooters. Crutches. Turn on the lights when you go into a dark area. Replace any light bulbs as soon as they burn out. Set up your furniture so you have a clear path. Avoid moving your furniture around. If any of your floors are uneven, fix them. If there are any pets around you, be aware of where they are. Review your medicines with your doctor. Some medicines can make you feel dizzy. This can increase your chance of falling. Ask your doctor what other things that you can do to help prevent falls. This information is not intended to replace advice given to you by your health care provider. Make sure you discuss any  questions you have with your health care provider. Document Released: 06/20/2009 Document Revised: 01/30/2016 Document Reviewed: 09/28/2014 Elsevier Interactive Patient Education  2017 [employer].

## 2024-04-18 ENCOUNTER — Ambulatory Visit (INDEPENDENT_AMBULATORY_CARE_PROVIDER_SITE_OTHER): Admitting: Adult Health

## 2024-04-18 ENCOUNTER — Encounter: Admitting: Physical Therapy

## 2024-04-25 ENCOUNTER — Encounter: Payer: Self-pay | Admitting: Internal Medicine

## 2024-04-28 ENCOUNTER — Encounter: Admitting: Physical Therapy

## 2024-05-01 ENCOUNTER — Encounter: Admitting: Physical Therapy

## 2024-05-02 ENCOUNTER — Telehealth (INDEPENDENT_AMBULATORY_CARE_PROVIDER_SITE_OTHER): Payer: Self-pay | Admitting: *Deleted

## 2024-05-02 NOTE — Telephone Encounter (Signed)
 Patient's medication(Ozempic ) came on 05/01/24,contacted her and she does not want any longer. We will resend back to company.

## 2024-05-10 ENCOUNTER — Other Ambulatory Visit: Payer: Self-pay | Admitting: Obstetrics & Gynecology

## 2024-05-10 DIAGNOSIS — Z1231 Encounter for screening mammogram for malignant neoplasm of breast: Secondary | ICD-10-CM

## 2024-05-13 ENCOUNTER — Other Ambulatory Visit (INDEPENDENT_AMBULATORY_CARE_PROVIDER_SITE_OTHER): Payer: Self-pay | Admitting: Adult Health

## 2024-05-16 ENCOUNTER — Ambulatory Visit (INDEPENDENT_AMBULATORY_CARE_PROVIDER_SITE_OTHER): Admitting: Adult Health

## 2024-05-17 ENCOUNTER — Ambulatory Visit (HOSPITAL_COMMUNITY)
Admission: RE | Admit: 2024-05-17 | Discharge: 2024-05-17 | Disposition: A | Discharge status: Home / Self Care | Source: Ambulatory Visit | Attending: Cardiovascular Disease | Admitting: Cardiovascular Disease

## 2024-05-17 DIAGNOSIS — R55 Syncope and collapse: Secondary | ICD-10-CM | POA: Insufficient documentation

## 2024-05-17 LAB — ECHOCARDIOGRAM COMPLETE
Area-P 1/2: 3.42 cm2
S' Lateral: 2.9 cm

## 2024-05-18 ENCOUNTER — Ambulatory Visit: Payer: Self-pay | Admitting: Cardiology

## 2024-06-05 ENCOUNTER — Ambulatory Visit
Admission: RE | Admit: 2024-06-05 | Discharge: 2024-06-05 | Disposition: A | Discharge status: Home / Self Care | Source: Ambulatory Visit | Attending: Obstetrics & Gynecology | Admitting: Obstetrics & Gynecology

## 2024-06-05 DIAGNOSIS — Z1231 Encounter for screening mammogram for malignant neoplasm of breast: Secondary | ICD-10-CM | POA: Diagnosis not present

## 2024-06-08 ENCOUNTER — Ambulatory Visit

## 2024-06-08 NOTE — Progress Notes (Signed)
 Patient was in C/o still feeling off in LLD -measured and is still approx 1 shorter on Right  Will try to get prior auth [id number] x 1ea  Right   Mckenzie Hebert Cped, CFo, CFm

## 2024-06-09 ENCOUNTER — Ambulatory Visit (INDEPENDENT_AMBULATORY_CARE_PROVIDER_SITE_OTHER)

## 2024-06-09 VITALS — BP 120/80 | HR 66 | Ht 67.0 in | Wt 275.0 lb

## 2024-06-09 DIAGNOSIS — Z23 Encounter for immunization: Secondary | ICD-10-CM | POA: Diagnosis not present

## 2024-06-09 NOTE — Progress Notes (Signed)
 Patient presents to the clinic for a flu vaccine. Patient received a flu vaccine IM left deltoid, patient tolerated well.

## 2024-06-20 ENCOUNTER — Other Ambulatory Visit: Payer: Self-pay | Admitting: Internal Medicine

## 2024-07-04 ENCOUNTER — Other Ambulatory Visit: Payer: Self-pay | Admitting: Internal Medicine

## 2024-07-20 ENCOUNTER — Other Ambulatory Visit: Payer: Self-pay | Admitting: Cardiology

## 2024-08-07 ENCOUNTER — Other Ambulatory Visit (INDEPENDENT_AMBULATORY_CARE_PROVIDER_SITE_OTHER): Payer: Self-pay

## 2024-08-07 ENCOUNTER — Encounter (INDEPENDENT_AMBULATORY_CARE_PROVIDER_SITE_OTHER): Payer: Self-pay

## 2024-08-24 ENCOUNTER — Telehealth: Payer: Self-pay

## 2024-08-24 NOTE — Telephone Encounter (Signed)
 Patient as calling in about the shoe lift. I did not see if Prior auth approval was received. She would like to know if it will still be done or if she should go to [facility].

## 2024-09-01 ENCOUNTER — Encounter: Payer: Self-pay | Admitting: Cardiology

## 2024-09-14 ENCOUNTER — Ambulatory Visit: Admitting: Podiatry

## 2024-09-14 DIAGNOSIS — E139 Other specified diabetes mellitus without complications: Secondary | ICD-10-CM

## 2024-09-14 DIAGNOSIS — M779 Enthesopathy, unspecified: Secondary | ICD-10-CM

## 2024-09-15 NOTE — Progress Notes (Signed)
 Subjective:   Patient ID: Mckenzie Hebert, female   DOB: 69 y.o.   MRN: [id number]   HPI Patient presents with attempts and orthotics that have not been successful for   ROS      Objective:  Physical Exam  Does have difficult foot structure with a significant limb length discrepancy and fibroma nodular formation left     Assessment:  Very difficult foot with orthotics that appear to be good but are not fitted for the patient like she wants     Plan:  Have made several attempts were going to send her to a pedorthist outside our system to try to see if he can help and I did do courtesy debridement of lesions today

## 2024-09-20 ENCOUNTER — Telehealth: Payer: Self-pay | Admitting: Lab

## 2024-09-20 NOTE — Telephone Encounter (Signed)
 Order never faxed to [facility] patient stated provider was suppose to send order.

## 2024-09-21 NOTE — Addendum Note (Signed)
 Addended by: Alisandra Son L on: 09/21/2024 04:27 PM   Modules accepted: Orders

## 2024-09-21 NOTE — Telephone Encounter (Signed)
Sent again and notified patient.

## 2024-10-02 NOTE — Progress Notes (Unsigned)
" °  Cardiology Office Note   Date:  10/02/2024  ID:  Mckenzie Hebert 1955-09-30, MRN [id number] PCP: [name], MD  [facility] Providers Cardiologist:  None { Click to update primary MD,subspecialty MD or APP then REFRESH:1}    History of Present Illness Mckenzie Hebert is a 69 y.o. female with a past medical history of hypothyroidism, prediabetes, PAC/PVCs. Presents today for a 6 month follow up   Patient previously underwent echocardiogram in 11/2020 that showed EF 60-65%, no wall motion abnormalities, grade I DD, normal RV systolic function, no significant valvular abnormalities. Cardiac monitor in 01/2021 showed 31 episodes of SVT (longest lasting 14 seconds), 10% PACs, 10% PVCs. She was started on metoprolol .  After stating her BB, repeat monitor in 07/2021 showed 14.6% PAC burden, 3.7% PVC burden. Her metoprolol  was increased.   She was last seen by Dr. Kate on 04/06/24. At that time, patient was doing well. She had an episode of vertigo that improved with vestibular rehabilitation. Had some fatigue, and her metoprolol  dose was decreased. She underwent echocardiogram 05/17/24 that showed EF 60-65%, no regional wall motion abnormalities, normal LV diastolic parameters, normal RV systolic function, no significant valvular abnormalities   Syncope   Frequent PVCs/PACs  - Previously had 10% PAC and PVC burdens in 02/2021. After being started on BB, repeat monitor in 07/2021 showed 14.6% PAC burden, 3.7% PVC burden  - Most recent echo from 05/2024 showed EF 60-65%, no wall motion abnormalities, normal RV systolic function, no valvular abnormalities  - Continue metoprolol  succinate ***   HLD  Elevated coronary calcium  score  - She had a coronary calcium  score of 353 (94th percentile) in 02/2021  -  - Lipid panel from 04/2024 showed LDL 37, HDL 58, triglycerides 81, total cholesterol 111  - Continue zetia  10 mg daily, crestor  10 mg daily  HTN  -  - Continue  losartan  25 mg daily, metoprolol  succinate 25 mg daily ***  - K 4.5, creatinine 0.61 in 04/2024   Dilated Aorta  - Dilated ascending aorta measured 41 mm on calcium  score 02/27/2021. CTA chest 03/2022 showed no aortic aneurysm   ROS: ***  Studies Reviewed      *** Risk Assessment/Calculations {Does this patient have ATRIAL FIBRILLATION?:[id number]} No BP recorded.  {Refresh Note OR Click here to enter BP  :1}***       Physical Exam VS:  LMP 03/18/2008        Wt Readings from Last 3 Encounters:  06/09/24 275 lb (124.7 kg)  04/17/24 274 lb (124.3 kg)  04/06/24 275 lb (124.7 kg)    GEN: Well nourished, well developed in no acute distress NECK: No JVD; No carotid bruits CARDIAC: ***RRR, no murmurs, rubs, gallops RESPIRATORY:  Clear to auscultation without rales, wheezing or rhonchi  ABDOMEN: Soft, non-tender, non-distended EXTREMITIES:  No edema; No deformity   ASSESSMENT AND PLAN ***    {Are you ordering a CV Procedure (e.g. stress test, cath, DCCV, TEE, etc)?   Press F2        :[id number]}  Dispo: ***  Signed, Rollo FABIENE Louder, PA-C   "

## 2024-10-03 ENCOUNTER — Other Ambulatory Visit: Payer: Self-pay

## 2024-10-04 ENCOUNTER — Ambulatory Visit: Admitting: Cardiology

## 2024-10-05 ENCOUNTER — Other Ambulatory Visit: Payer: Self-pay | Admitting: Cardiology

## 2024-10-05 MED ORDER — ROSUVASTATIN CALCIUM 10 MG PO TABS: 10.0000 mg | ORAL_TABLET | Freq: Every day | ORAL | 1 refills | Status: AC

## 2024-10-05 NOTE — Telephone Encounter (Signed)
 Lipid Panel within 12 months 04/14/24

## 2024-10-06 NOTE — Progress Notes (Incomplete)
 "   Patient Care Team: [name], MD as PCP - General (Internal Medicine)  Visit Date: 10/06/24  Subjective:    Patient ID: Mckenzie Hebert , Female   DOB: [birth year], 69 y.o.    MRN: [id number]   69 y.o. Female presents today for 6 month follow up for . Patient has a past medical history of ***.  History of Palpitations treated with Metoprolol  succinate 37.5 mg daily. Had near syncope episode in 11/2020 and a cardiac evaluation spring of 2022. Myoview study normal. 14 day monitor showed frequent PVCs, frequent PACs and 31 episodes of SVT with the longest lasting 14 seconds.  Echocardiogram on 11/29/20 showed normal biventricular function, no significant valvular disease. Hypertension treated with Losartan  25 mg daily.SABRA    History of Hyperlipidemia treated with Rosuvastatin  10 mg daily and Zetia  10 mg daily.  2022 Coronary Calcium  Score: 353.    History of Diabetes Mellitus, type II treated with Metformin  500 mg twice daily.  Eye exam reportedly completed 10/2023 at [facility], last exam 11/11/2022 did not detect diabetic retinopathy. Weight loss followed by NP Rockie Dalton.    History of Hypothyroidism treated with Levothyroxine  150 mcg daily.    History of Insomnia treated with Xanax  1 mg as needed.   History of Vitamin-D Deficiency treated with 50,000 units weekly. 02/17/2024 Vitamin-D: 39.5.   History of GERD treated with Pantoprazole  40 mg daily.    History of Asthma managed with Albuterol  inhaler as needed.   History of Functional Constipation managed with Colace 100 mg twice daily      Past Medical History:  Diagnosis Date   AC (acromioclavicular) joint bone spurs 2008   right foot   Anxiety    Asthma    Back pain    Bilateral swelling of feet    Constipation    Dyspnea    Dysrhythmia 2019   PVC   Endometrial polyp    Heartburn    History of colon polyps    Hypothyroidism    Joint pain    Migraines    Dr Malcom   Osteoarthritis    PAC  (premature atrial contraction)    Palpitations    cardiologist-  dr hochrein-- normal myoview 2017, holter 2015 mild ectopy   PONV (postoperative nausea and vomiting)    Pre-diabetes 2019   PVC (premature ventricular contraction)    SOB (shortness of breath)    Thyroid  disease    Uterine fibroid    Vitamin D  deficiency      Family History  Problem Relation Age of Onset   Diabetes Mother    Hypertension Mother    Heart disease Mother 19       CABG   Stroke Mother    Hyperlipidemia Mother    Cancer Father        lung cancer   Depression Father    Alcohol abuse Father    Diabetes Sister    Hypertension Sister    Thyroid  disease Sister        hyperthyroidism   Hypertension Sister    Autoimmune disease Sister    Breast cancer Paternal Aunt        x4   Leukemia Paternal Grandmother    Mental retardation Other     Social History   Social History Narrative   Lives alone.  Retired, previously worked as a runner, broadcasting/film/video for [employer]. Widowed, husband deceased due to heart problems. No children. Former smoker until age 41. No  alcohol consumption.      ROS      Objective:   Vitals: LMP 03/18/2008    Physical Exam    Results:   Studies obtained and personally reviewed by me:  Imaging, colonoscopy, mammogram, bone density scan, echocardiogram, heart cath, stress test, CT calcium  score, etc. ***   Labs:       Component Value Date/Time   NA 139 04/14/2024 0916   NA 138 02/17/2024 1613   K 4.5 04/14/2024 0916   CL 104 04/14/2024 0916   CO2 29 04/14/2024 0916   GLUCOSE 140 (H) 04/14/2024 0916   BUN 12 04/14/2024 0916   BUN 16 02/17/2024 1613   CREATININE 0.61 04/14/2024 0916   CALCIUM  9.0 04/14/2024 0916   PROT 6.6 04/14/2024 0916   PROT 7.0 02/17/2024 1613   ALBUMIN 4.4 02/17/2024 1613   AST 16 04/14/2024 0916   ALT 20 04/14/2024 0916   ALKPHOS 67 02/17/2024 1613   BILITOT 0.5 04/14/2024 0916   BILITOT 0.3 02/17/2024 1613    GFRNONAA 94 01/28/2021 0919   GFRAA 109 01/28/2021 0919     Lab Results  Component Value Date   WBC 5.2 04/14/2024   HGB 12.3 04/14/2024   HCT 38.0 04/14/2024   MCV 84.3 04/14/2024   PLT 237 04/14/2024    Lab Results  Component Value Date   CHOL 111 04/14/2024   HDL 58 04/14/2024   LDLCALC 37 04/14/2024   TRIG 81 04/14/2024   CHOLHDL 1.9 04/14/2024    Lab Results  Component Value Date   HGBA1C 6.8 (H) 04/14/2024     Lab Results  Component Value Date   TSH 1.62 04/14/2024     No results found for: PSA1, PSA *** delete for female pts  ***    Assessment & Plan:   ***    I,Makayla C Reid,acting as a scribe for Ronal JINNY Hailstone, MD.,have documented all relevant documentation on the behalf of Ronal JINNY Hailstone, MD,as directed by  Ronal JINNY Hailstone, MD while in the presence of Ronal JINNY Hailstone, MD.   ***   "

## 2024-10-09 MED ORDER — ROSUVASTATIN CALCIUM 10 MG PO TABS: 10.0000 mg | ORAL_TABLET | Freq: Every day | ORAL | 1 refills | Status: AC

## 2024-10-09 NOTE — Telephone Encounter (Signed)
 Lipid Completed on 04/14/24

## 2024-10-12 ENCOUNTER — Other Ambulatory Visit: Payer: Self-pay | Admitting: Internal Medicine

## 2024-10-16 ENCOUNTER — Ambulatory Visit: Admitting: Cardiology

## 2024-10-19 ENCOUNTER — Other Ambulatory Visit: Payer: Self-pay

## 2024-10-20 ENCOUNTER — Ambulatory Visit: Payer: Self-pay | Admitting: Internal Medicine

## 2025-03-05 ENCOUNTER — Ambulatory Visit (HOSPITAL_BASED_OUTPATIENT_CLINIC_OR_DEPARTMENT_OTHER): Payer: Medicare HMO | Admitting: Obstetrics & Gynecology
